# Patient Record
Sex: Female | Born: 1940 | Race: White | Hispanic: No | Marital: Married | Smoking: Former smoker
Health system: Southern US, Community
[De-identification: ages and names within clinical notes are randomized; demographics above are authoritative.]

## PROBLEM LIST (undated history)

## (undated) DIAGNOSIS — F329 Major depressive disorder, single episode, unspecified: Secondary | ICD-10-CM

## (undated) DIAGNOSIS — M549 Dorsalgia, unspecified: Secondary | ICD-10-CM

## (undated) DIAGNOSIS — IMO0002 Reserved for concepts with insufficient information to code with codable children: Secondary | ICD-10-CM

## (undated) DIAGNOSIS — R42 Dizziness and giddiness: Secondary | ICD-10-CM

## (undated) DIAGNOSIS — K219 Gastro-esophageal reflux disease without esophagitis: Secondary | ICD-10-CM

## (undated) DIAGNOSIS — E039 Hypothyroidism, unspecified: Secondary | ICD-10-CM

## (undated) DIAGNOSIS — I2510 Atherosclerotic heart disease of native coronary artery without angina pectoris: Secondary | ICD-10-CM

## (undated) DIAGNOSIS — M79672 Pain in left foot: Secondary | ICD-10-CM

## (undated) DIAGNOSIS — M25579 Pain in unspecified ankle and joints of unspecified foot: Secondary | ICD-10-CM

## (undated) DIAGNOSIS — E785 Hyperlipidemia, unspecified: Secondary | ICD-10-CM

## (undated) DIAGNOSIS — K449 Diaphragmatic hernia without obstruction or gangrene: Secondary | ICD-10-CM

## (undated) DIAGNOSIS — D126 Benign neoplasm of colon, unspecified: Secondary | ICD-10-CM

## (undated) DIAGNOSIS — F32A Depression, unspecified: Secondary | ICD-10-CM

## (undated) DIAGNOSIS — C73 Malignant neoplasm of thyroid gland: Secondary | ICD-10-CM

## (undated) DIAGNOSIS — L989 Disorder of the skin and subcutaneous tissue, unspecified: Secondary | ICD-10-CM

## (undated) DIAGNOSIS — T7840XA Allergy, unspecified, initial encounter: Secondary | ICD-10-CM

## (undated) DIAGNOSIS — S8290XA Unspecified fracture of unspecified lower leg, initial encounter for closed fracture: Secondary | ICD-10-CM

## (undated) DIAGNOSIS — I10 Essential (primary) hypertension: Secondary | ICD-10-CM

## (undated) DIAGNOSIS — M1990 Unspecified osteoarthritis, unspecified site: Secondary | ICD-10-CM

## (undated) HISTORY — DX: Benign neoplasm of colon, unspecified: D12.6

## (undated) HISTORY — PX: FRACTURE SURGERY: SHX138

## (undated) HISTORY — DX: Malignant neoplasm of thyroid gland: C73

## (undated) HISTORY — DX: Reserved for concepts with insufficient information to code with codable children: IMO0002

## (undated) HISTORY — PX: TUBAL LIGATION: SHX77

## (undated) HISTORY — DX: Essential (primary) hypertension: I10

## (undated) HISTORY — PX: BALLOON ANGIOPLASTY, ARTERY: SHX564

## (undated) HISTORY — DX: Gastro-esophageal reflux disease without esophagitis: K21.9

## (undated) HISTORY — PX: THYROIDECTOMY, PARTIAL: SHX18

## (undated) HISTORY — DX: Hyperlipidemia, unspecified: E78.5

## (undated) HISTORY — DX: Dizziness and giddiness: R42

## (undated) HISTORY — DX: Unspecified osteoarthritis, unspecified site: M19.90

## (undated) HISTORY — DX: Atherosclerotic heart disease of native coronary artery without angina pectoris: I25.10

## (undated) HISTORY — DX: Pain in unspecified ankle and joints of unspecified foot: M25.579

## (undated) HISTORY — PX: LESION REMOVAL: SHX5196

## (undated) HISTORY — DX: Dorsalgia, unspecified: M54.9

## (undated) HISTORY — DX: Diaphragmatic hernia without obstruction or gangrene: K44.9

## (undated) HISTORY — DX: Unspecified fracture of unspecified lower leg, initial encounter for closed fracture: S82.90XA

## (undated) HISTORY — DX: Depression, unspecified: F32.A

## (undated) HISTORY — DX: Pain in left foot: M79.672

## (undated) HISTORY — DX: Hypothyroidism, unspecified: E03.9

## (undated) HISTORY — DX: Allergy, unspecified, initial encounter: T78.40XA

## (undated) HISTORY — DX: Major depressive disorder, single episode, unspecified: F32.9

## (undated) HISTORY — DX: Disorder of the skin and subcutaneous tissue, unspecified: L98.9

---

## 2000-02-27 ENCOUNTER — Encounter: Payer: Self-pay | Admitting: Otolaryngology

## 2000-02-27 ENCOUNTER — Encounter: Admission: RE | Admit: 2000-02-27 | Discharge: 2000-02-27 | Payer: Self-pay | Admitting: Otolaryngology

## 2000-03-16 ENCOUNTER — Other Ambulatory Visit: Admission: RE | Admit: 2000-03-16 | Discharge: 2000-03-16 | Payer: Self-pay | Admitting: Otolaryngology

## 2000-04-27 ENCOUNTER — Encounter: Payer: Self-pay | Admitting: Otolaryngology

## 2000-04-30 ENCOUNTER — Inpatient Hospital Stay (HOSPITAL_COMMUNITY): Admission: RE | Admit: 2000-04-30 | Discharge: 2000-05-01 | Payer: Self-pay | Admitting: Otolaryngology

## 2000-04-30 ENCOUNTER — Encounter (INDEPENDENT_AMBULATORY_CARE_PROVIDER_SITE_OTHER): Payer: Self-pay

## 2000-05-19 ENCOUNTER — Ambulatory Visit (HOSPITAL_COMMUNITY): Admission: RE | Admit: 2000-05-19 | Discharge: 2000-05-20 | Payer: Self-pay | Admitting: Otolaryngology

## 2000-08-27 ENCOUNTER — Ambulatory Visit (HOSPITAL_COMMUNITY): Admission: RE | Admit: 2000-08-27 | Discharge: 2000-08-27 | Payer: Self-pay | Admitting: Oncology

## 2000-08-27 ENCOUNTER — Encounter: Payer: Self-pay | Admitting: Oncology

## 2000-08-30 ENCOUNTER — Ambulatory Visit (HOSPITAL_COMMUNITY): Admission: RE | Admit: 2000-08-30 | Discharge: 2000-08-30 | Payer: Self-pay | Admitting: Oncology

## 2000-09-20 ENCOUNTER — Ambulatory Visit (HOSPITAL_COMMUNITY): Admission: RE | Admit: 2000-09-20 | Discharge: 2000-09-20 | Payer: Self-pay | Admitting: Oncology

## 2000-09-20 ENCOUNTER — Encounter: Payer: Self-pay | Admitting: Oncology

## 2002-03-14 ENCOUNTER — Encounter: Payer: Self-pay | Admitting: Cardiology

## 2002-06-07 ENCOUNTER — Ambulatory Visit (HOSPITAL_COMMUNITY): Admission: RE | Admit: 2002-06-07 | Discharge: 2002-06-07 | Payer: Self-pay | Admitting: Family Medicine

## 2002-09-20 ENCOUNTER — Encounter: Payer: Self-pay | Admitting: Oncology

## 2002-09-20 ENCOUNTER — Ambulatory Visit: Admission: RE | Admit: 2002-09-20 | Discharge: 2002-09-20 | Payer: Self-pay | Admitting: Oncology

## 2004-08-17 DIAGNOSIS — D126 Benign neoplasm of colon, unspecified: Secondary | ICD-10-CM

## 2004-08-17 HISTORY — DX: Benign neoplasm of colon, unspecified: D12.6

## 2004-10-30 ENCOUNTER — Ambulatory Visit: Payer: Self-pay | Admitting: Gastroenterology

## 2004-11-05 ENCOUNTER — Ambulatory Visit: Payer: Self-pay | Admitting: Gastroenterology

## 2004-12-10 ENCOUNTER — Ambulatory Visit (HOSPITAL_COMMUNITY): Admission: RE | Admit: 2004-12-10 | Discharge: 2004-12-10 | Payer: Self-pay | Admitting: Gastroenterology

## 2005-05-28 ENCOUNTER — Ambulatory Visit: Payer: Self-pay | Admitting: Oncology

## 2005-08-25 ENCOUNTER — Ambulatory Visit: Payer: Self-pay | Admitting: Cardiology

## 2005-09-18 ENCOUNTER — Encounter: Payer: Self-pay | Admitting: Cardiology

## 2005-09-18 ENCOUNTER — Ambulatory Visit: Payer: Self-pay

## 2006-03-10 ENCOUNTER — Ambulatory Visit (HOSPITAL_COMMUNITY): Admission: RE | Admit: 2006-03-10 | Discharge: 2006-03-10 | Payer: Self-pay | Admitting: Family Medicine

## 2006-03-10 ENCOUNTER — Encounter: Payer: Self-pay | Admitting: Vascular Surgery

## 2006-12-23 ENCOUNTER — Encounter: Admission: RE | Admit: 2006-12-23 | Discharge: 2006-12-23 | Payer: Self-pay | Admitting: Family Medicine

## 2007-05-04 ENCOUNTER — Other Ambulatory Visit: Admission: RE | Admit: 2007-05-04 | Discharge: 2007-05-04 | Payer: Self-pay | Admitting: Obstetrics & Gynecology

## 2007-05-04 ENCOUNTER — Encounter: Admission: RE | Admit: 2007-05-04 | Discharge: 2007-05-04 | Payer: Self-pay | Admitting: Obstetrics & Gynecology

## 2007-08-03 ENCOUNTER — Encounter: Admission: RE | Admit: 2007-08-03 | Discharge: 2007-08-03 | Payer: Self-pay | Admitting: Family Medicine

## 2007-08-15 ENCOUNTER — Encounter: Admission: RE | Admit: 2007-08-15 | Discharge: 2007-08-15 | Payer: Self-pay | Admitting: Family Medicine

## 2007-09-15 ENCOUNTER — Ambulatory Visit: Payer: Self-pay | Admitting: Oncology

## 2007-09-19 LAB — COMPREHENSIVE METABOLIC PANEL
ALT: 13 U/L (ref 0–35)
AST: 15 U/L (ref 0–37)
Alkaline Phosphatase: 80 U/L (ref 39–117)
Calcium: 9.9 mg/dL (ref 8.4–10.5)
Chloride: 99 mEq/L (ref 96–112)
Creatinine, Ser: 1.04 mg/dL (ref 0.40–1.20)
Glucose, Bld: 91 mg/dL (ref 70–99)
Potassium: 4.2 mEq/L (ref 3.5–5.3)
Total Protein: 7.7 g/dL (ref 6.0–8.3)

## 2007-09-19 LAB — CBC & DIFF AND RETIC
HCT: 38.9 % (ref 34.8–46.6)
HGB: 13.8 g/dL (ref 11.6–15.9)
LYMPH%: 36.9 % (ref 14.0–48.0)
MCH: 31.2 pg (ref 26.0–34.0)
MCV: 88.2 fL (ref 81.0–101.0)
MONO#: 0.5 10*3/uL (ref 0.1–0.9)
MONO%: 6.6 % (ref 0.0–13.0)
NEUT#: 3.7 10*3/uL (ref 1.5–6.5)
NEUT%: 52.9 % (ref 39.6–76.8)
RDW: 12.8 % (ref 11.3–14.5)
WBC: 6.9 10*3/uL (ref 3.9–10.0)

## 2007-09-19 LAB — LACTATE DEHYDROGENASE: LDH: 155 U/L (ref 94–250)

## 2007-09-21 ENCOUNTER — Ambulatory Visit: Payer: Self-pay | Admitting: Gastroenterology

## 2007-09-30 ENCOUNTER — Encounter: Payer: Self-pay | Admitting: Gastroenterology

## 2007-09-30 ENCOUNTER — Ambulatory Visit: Payer: Self-pay | Admitting: Gastroenterology

## 2007-10-24 ENCOUNTER — Encounter: Payer: Self-pay | Admitting: Cardiology

## 2008-01-05 ENCOUNTER — Ambulatory Visit: Payer: Self-pay | Admitting: Oncology

## 2008-01-11 ENCOUNTER — Ambulatory Visit (HOSPITAL_COMMUNITY): Admission: RE | Admit: 2008-01-11 | Discharge: 2008-01-11 | Payer: Self-pay | Admitting: Oncology

## 2008-02-05 ENCOUNTER — Inpatient Hospital Stay (HOSPITAL_COMMUNITY): Admission: EM | Admit: 2008-02-05 | Discharge: 2008-02-07 | Payer: Self-pay | Admitting: Cardiology

## 2008-02-05 ENCOUNTER — Encounter: Payer: Self-pay | Admitting: Emergency Medicine

## 2008-07-16 ENCOUNTER — Ambulatory Visit: Payer: Self-pay | Admitting: Vascular Surgery

## 2008-07-16 ENCOUNTER — Encounter: Payer: Self-pay | Admitting: Cardiology

## 2008-07-16 ENCOUNTER — Observation Stay (HOSPITAL_COMMUNITY): Admission: EM | Admit: 2008-07-16 | Discharge: 2008-07-17 | Payer: Self-pay | Admitting: *Deleted

## 2009-09-26 ENCOUNTER — Encounter (INDEPENDENT_AMBULATORY_CARE_PROVIDER_SITE_OTHER): Payer: Self-pay | Admitting: *Deleted

## 2009-10-14 ENCOUNTER — Encounter (INDEPENDENT_AMBULATORY_CARE_PROVIDER_SITE_OTHER): Payer: Self-pay | Admitting: *Deleted

## 2009-11-19 ENCOUNTER — Encounter (INDEPENDENT_AMBULATORY_CARE_PROVIDER_SITE_OTHER): Payer: Self-pay | Admitting: *Deleted

## 2009-11-20 ENCOUNTER — Ambulatory Visit: Payer: Self-pay | Admitting: Gastroenterology

## 2009-11-25 ENCOUNTER — Telehealth (INDEPENDENT_AMBULATORY_CARE_PROVIDER_SITE_OTHER): Payer: Self-pay | Admitting: *Deleted

## 2009-12-03 ENCOUNTER — Ambulatory Visit: Payer: Self-pay | Admitting: Gastroenterology

## 2009-12-04 ENCOUNTER — Encounter: Payer: Self-pay | Admitting: Gastroenterology

## 2010-09-16 NOTE — Miscellaneous (Signed)
Summary: [facility]  Clinical Lists Changes  Medications: Added new medication of MOVIPREP 100 GM  SOLR (PEG-KCL-NACL-NASULF-NA ASC-C) As per prep instructions. - Signed Rx of MOVIPREP 100 GM  SOLR (PEG-KCL-NACL-NASULF-NA ASC-C) As per prep instructions.;  #1 x 0;  Signed;  Entered by: Ezra Sites RN;  Authorized by: Meryl Dare MD [facility];  Method used: [facility]  [place] [address], [place], [place]  [address], Ph: [phone] or [phone], Fax: [phone] Observations: Added new observation of NKA: T (11/20/2009 12:50)    Prescriptions: MOVIPREP 100 GM  SOLR (PEG-KCL-NACL-NASULF-NA ASC-C) As per prep instructions.  #1 x 0   Entered by:   Ezra Sites RN   Authorized by:   Meryl Dare MD [facility]   Signed by:   Ezra Sites RN on 11/20/2009   Method used:   [facility]        [facility]  [place] [address] (retail)       [address]       [place], [place]  [address]       Ph: [phone] or [phone]       Fax: [phone]   RxID:   [id number]

## 2010-09-16 NOTE — Letter (Signed)
Summary: Previsit letter  [facility]  [address] [place], [place] [address]   Phone: [phone]  Fax: [phone]       10/14/2009 MRN: [id number]  [facility] [address] [place], [place]  [address]  Dear [name],  Welcome to the Gastroenterology Division at [facility].    You are scheduled to see a nurse for your pre-procedure visit on 11-20-09 at 1pm on the 3rd floor at [facility], [address]  We ask that you try to arrive at our office 15 minutes prior to your appointment time to allow for check-in.  Your nurse visit will consist of discussing your medical and surgical history, your immediate family medical history, and your medications.    Please bring a complete list of all your medications or, if you prefer, bring the medication bottles and we will list them.  We will need to be aware of both prescribed and over the counter drugs.  We will need to know exact dosage information as well.  If you are on blood thinners (Coumadin, Plavix, Aggrenox, Ticlid, etc.) please call our office today/prior to your appointment, as we need to consult with your physician about holding your medication.   Please be prepared to read and sign documents such as consent forms, a financial agreement, and acknowledgement forms.  If necessary, and with your consent, a friend or relative is welcome to sit-in on the nurse visit with you.  Please bring your insurance card so that we may make a copy of it.  If your insurance requires a referral to see a specialist, please bring your referral form from your primary care physician.  No co-pay is required for this nurse visit.     If you cannot keep your appointment, please call [phone] to cancel or reschedule prior to your appointment date.  This allows [place] the opportunity to schedule an appointment for another patient in need of care.    Thank you for choosing [facility] for your medical needs.  We  appreciate the opportunity to care for you.  Please visit [place] at our website  to learn more about our practice.                     Sincerely.                                                                                                                   The Gastroenterology Division

## 2010-09-16 NOTE — Letter (Signed)
Summary: Patient Notice- Polyp Results  [facility]  [address] [place], [place] [address]   Phone: [phone]  Fax: [phone]        December 04, 2009 MRN: [id number]    Colleen Wright [address] [place], [place]  [address]    Dear Ms. [name],  I am pleased to inform you that the colon polyp(s) removed during your recent colonoscopy was (were) found to be benign (no cancer detected) upon pathologic examination.  I recommend you have a repeat colonoscopy examination in 5 years to look for recurrent polyps, as having colon polyps increases your risk for having recurrent polyps or even colon cancer in the future.  Should you develop new or worsening symptoms of abdominal pain, bowel habit changes or bleeding from the rectum or bowels, please schedule an evaluation with either your primary care physician or with me.  Continue treatment plan as outlined the day of your exam.  Please call us if you are having persistent problems or have questions about your condition that have not been fully answered at this time.  Sincerely,  Meryl Dare MD [facility]  This letter has been electronically signed by your physician.  Appended Document: Patient Notice- Polyp Results letter mailed 4.25.11

## 2010-09-16 NOTE — Procedures (Signed)
Summary: Colonoscopy  Patient: Colleen Wright Note: All result statuses are Final unless otherwise noted.  Tests: (1) Colonoscopy (COL)   COL Colonoscopy           DONE     [facility]     [address]     [place], [place]  [address]           COLONOSCOPY PROCEDURE REPORT           PATIENT:  Colleen, Wright  MR#:  [id number]     BIRTHDATE:  November 05, 1940, 68 yrs. old  GENDER:  female     ENDOSCOPIST:  Judie Petit T. Russella Dar, MD, [facility]           PROCEDURE DATE:  12/03/2009     PROCEDURE:  Colonoscopy with snare polypectomy     ASA CLASS:  Class II     INDICATIONS:  1) surveillance and high-risk screening  2)     follow-up of polyp, adenomatous polyp, 10/2004.     MEDICATIONS:   Fentanyl 100 mcg IV, Versed 10 mg IV     DESCRIPTION OF PROCEDURE:   After the risks benefits and     alternatives of the procedure were thoroughly explained, informed     consent was obtained.  Digital rectal exam was performed and     revealed no abnormalities.   The LB PCF-H180AL [id number] endoscope     was introduced through the anus and advanced to the cecum, which     was identified by both the appendix and ileocecal valve, without     limitations.  The quality of the prep was excellent, using     MoviPrep.  The instrument was then slowly withdrawn as the colon     was fully examined.     <<PROCEDUREIMAGES>>           FINDINGS:  A sessile polyp was found at the hepatic flexure. It     was 5 mm in size. Polyp was snared without cautery. Retrieval was     successful. A normal appearing cecum, ileocecal valve, and     appendiceal orifice were identified. The ascending, transverse,     splenic flexure, descending, sigmoid colon, and rectum appeared     unremarkable. Retroflexed views in the rectum revealed no     abnormalities.  The time to cecum =  2  minutes. The scope was     then withdrawn (time =  8  min) from the patient and the procedure     completed.           COMPLICATIONS:  None     ENDOSCOPIC  IMPRESSION:     1) 5 mm sessile polyp at the hepatic flexure     RECOMMENDATIONS:     1) Await pathology results     2) Repeat Colonoscopy in 5 years.           Venita Lick. Russella Dar, MD, [facility]           CC: Evelena Peat, MD           n.     Rosalie DoctorVenita Lick. Leanndra Pember at 12/03/2009 10:31 AM           Colleen Wright, [id number]  Note: An exclamation mark (!) indicates a result that was not dispersed into the flowsheet. Document Creation Date: 12/03/2009 10:32 AM _______________________________________________________________________  (1) Order result status: Final Collection or observation date-time: 12/03/2009 10:24 Requested date-time:  Receipt date-time:  Reported  date-time:  Referring Physician:   Ordering Physician: Claudette Head [phone]) Specimen Source:  Source: Launa Grill Order Number: [id number] Lab site:   Appended Document: Colonoscopy     Procedures Next Due Date:    Colonoscopy: 11/2014

## 2010-09-16 NOTE — Letter (Signed)
Summary: [facility] Instructions  [facility]  [address] [place], [place] [address]   Phone: [phone]  Fax: [phone]       Colleen Wright    1941-03-05    MRN: [id number]        Procedure Day /Date:  Tuesday 12/03/2009     Arrival Time: 9:00 am      Procedure Time: 10:00 am     Location of Procedure:                    _ x_  [facility] (4th Floor)                        PREPARATION FOR COLONOSCOPY WITH MOVIPREP   Starting 5 days prior to your procedure Thursday 4/14 do not eat nuts, seeds, popcorn, corn, beans, peas,  salads, or any raw vegetables.  Do not take any fiber supplements (e.g. Metamucil, Citrucel, and Benefiber).  THE DAY BEFORE YOUR PROCEDURE         DATE: Monday 4/18  1.  Drink clear liquids the entire day-NO SOLID FOOD  2.  Do not drink anything colored red or purple.  Avoid juices with pulp.  No orange juice.  3.  Drink at least 64 oz. (8 glasses) of fluid/clear liquids during the day to prevent dehydration and help the prep work efficiently.  CLEAR LIQUIDS INCLUDE: Water Jello Ice Popsicles Tea (sugar ok, no milk/cream) Powdered fruit flavored drinks Coffee (sugar ok, no milk/cream) Gatorade Juice: apple, white grape, white cranberry  Lemonade Clear bullion, consomm, broth Carbonated beverages (any kind) Strained chicken noodle soup Hard Candy                             4.  In the morning, mix first dose of MoviPrep solution:    Empty 1 Pouch A and 1 Pouch B into the disposable container    Add lukewarm drinking water to the top line of the container. Mix to dissolve    Refrigerate (mixed solution should be used within 24 hrs)  5.  Begin drinking the prep at 5:00 p.m. The MoviPrep container is divided by 4 marks.   Every 15 minutes drink the solution down to the next mark (approximately 8 oz) until the full liter is complete.   6.  Follow completed prep with 16 oz of clear liquid of your choice (Nothing  red or purple).  Continue to drink clear liquids until bedtime.  7.  Before going to bed, mix second dose of MoviPrep solution:    Empty 1 Pouch A and 1 Pouch B into the disposable container    Add lukewarm drinking water to the top line of the container. Mix to dissolve    Refrigerate  THE DAY OF YOUR PROCEDURE      DATE: Tuesday 4/19  Beginning at 5:00 a.m. (5 hours before procedure):         1. Every 15 minutes, drink the solution down to the next mark (approx 8 oz) until the full liter is complete.  2. Follow completed prep with 16 oz. of clear liquid of your choice.    3. You may drink clear liquids until 8:00 am (2 HOURS BEFORE PROCEDURE).   MEDICATION INSTRUCTIONS  Unless otherwise instructed, you should take regular prescription medications with a small sip of water   as early as possible the morning of  your procedure.           OTHER INSTRUCTIONS  You will need a responsible adult at least 72 years of age to accompany you and drive you home.   This person must remain in the waiting room during your procedure.  Wear loose fitting clothing that is easily removed.  Leave jewelry and other valuables at home.  However, you may wish to bring a book to read or  an iPod/MP3 player to listen to music as you wait for your procedure to start.  Remove all body piercing jewelry and leave at home.  Total time from sign-in until discharge is approximately 2-3 hours.  You should go home directly after your procedure and rest.  You can resume normal activities the  day after your procedure.  The day of your procedure you should not:   Drive   Make legal decisions   Operate machinery   Drink alcohol   Return to work  You will receive specific instructions about eating, activities and medications before you leave.    The above instructions have been reviewed and explained to me by   Ezra Sites RN  November 20, 2009 1:28 PM     I fully understand and can  verbalize these instructions _____________________________ Date _________

## 2010-09-16 NOTE — Letter (Signed)
Summary: Colonoscopy Letter  [facility]  [address] [place], [place] [address]   Phone: [phone]  Fax: [phone]      September 26, 2009 MRN: [id number]   HEBAH BOGOSIAN [address] [place], [place]  [address]   Dear Ms. [name],   According to your medical record, it is time for you to schedule a Colonoscopy. The American Cancer Society recommends this procedure as a method to detect early colon cancer. Patients with a family history of colon cancer, or a personal history of colon polyps or inflammatory bowel disease are at increased risk.  This letter has beeen generated based on the recommendations made at the time of your procedure. If you feel that in your particular situation this may no longer apply, please contact our office.  Please call our office at [phone] to schedule this appointment or to update your records at your earliest convenience.  Thank you for cooperating with [place] to provide you with the very best care possible.   Sincerely,  Judie Petit T. Russella Dar, M.D.  [facility] [phone]

## 2010-09-16 NOTE — Progress Notes (Signed)
Summary: Moviprep is too expensive  Phone Note Call from Patient   Caller: Patient Call For: Dr. Russella Dar Reason for Call: Talk to Nurse Summary of Call: Moviprep is too expensive--would like something else Initial call taken by: Karna Christmas,  November 25, 2009 12:20 PM  Follow-up for Phone Call        Attempted to speak with the patient.  Answering machine picked up with no ID. did not leave a message.  Will try again later. Follow-up by: Clide Cliff RN,  November 25, 2009 3:19 PM  Additional Follow-up for Phone Call Additional follow up Details #1::        Still only answering machine picked up.  No ID. Unable to reach patient today.  Hopefully pt will call back tomorrow. Additional Follow-up by: Clide Cliff RN,  November 25, 2009 5:16 PM     Appended Document: Colleen Wright is too expensive Spoke with pt regarding the Moviprep. Pt was not sure how much the Moviprep was going to cost her, the pharmacist had called her. Will send the pt the $20.00 rebate for the Moviprep and she will try it. She will call back if she has further questions.

## 2010-12-10 ENCOUNTER — Other Ambulatory Visit: Payer: Self-pay | Admitting: Cardiology

## 2010-12-10 DIAGNOSIS — E7800 Pure hypercholesterolemia, unspecified: Secondary | ICD-10-CM

## 2010-12-10 MED ORDER — SIMVASTATIN 20 MG PO TABS: 20.0000 mg | ORAL_TABLET | Freq: Every day | ORAL | Status: DC

## 2010-12-10 NOTE — Telephone Encounter (Signed)
Wants to know if she can change from Crestor to Pravastatin since Crestor is so expensive

## 2010-12-10 NOTE — Telephone Encounter (Signed)
Refilled simvastatin.

## 2010-12-17 ENCOUNTER — Emergency Department (HOSPITAL_BASED_OUTPATIENT_CLINIC_OR_DEPARTMENT_OTHER)
Admission: EM | Admit: 2010-12-17 | Discharge: 2010-12-18 | Disposition: A | Discharge status: Home / Self Care | Payer: Medicare Other | Attending: Emergency Medicine | Admitting: Emergency Medicine

## 2010-12-17 ENCOUNTER — Emergency Department (INDEPENDENT_AMBULATORY_CARE_PROVIDER_SITE_OTHER): Payer: Medicare Other

## 2010-12-17 DIAGNOSIS — I10 Essential (primary) hypertension: Secondary | ICD-10-CM | POA: Insufficient documentation

## 2010-12-17 DIAGNOSIS — H539 Unspecified visual disturbance: Secondary | ICD-10-CM

## 2010-12-17 DIAGNOSIS — W108XXA Fall (on) (from) other stairs and steps, initial encounter: Secondary | ICD-10-CM | POA: Insufficient documentation

## 2010-12-17 DIAGNOSIS — S060X0A Concussion without loss of consciousness, initial encounter: Secondary | ICD-10-CM | POA: Insufficient documentation

## 2010-12-17 DIAGNOSIS — M8448XA Pathological fracture, other site, initial encounter for fracture: Secondary | ICD-10-CM | POA: Insufficient documentation

## 2010-12-17 DIAGNOSIS — M545 Low back pain, unspecified: Secondary | ICD-10-CM

## 2010-12-17 DIAGNOSIS — E7800 Pure hypercholesterolemia, unspecified: Secondary | ICD-10-CM | POA: Insufficient documentation

## 2010-12-17 DIAGNOSIS — I2510 Atherosclerotic heart disease of native coronary artery without angina pectoris: Secondary | ICD-10-CM | POA: Insufficient documentation

## 2010-12-17 DIAGNOSIS — R51 Headache: Secondary | ICD-10-CM

## 2010-12-30 NOTE — Cardiovascular Report (Signed)
NAMEAVARIE, TAVANO NO.:  [id number]   MEDICAL RECORD NO.:  [id number]           PATIENT TYPE:   LOCATION:                                 FACILITY:   PHYSICIAN:  Peter M. [place], M.D.  DATE OF BIRTH:  1941-07-08   DATE OF PROCEDURE:  DATE OF DISCHARGE:                            CARDIAC CATHETERIZATION   INDICATIONS FOR PROCEDURE:  A 70 year old white female who presents with  unstable angina.  Diagnostic cardiac catheterization demonstrated  moderate 50% stenosis in the mid LAD.  She had a 95% stenosis in the  origin of the posterolateral branch of the right coronary artery.  We  proceeded with percutaneous intervention of the posterolateral branch of  the right coronary artery.   EQUIPMENTS:  A 6-French left Amplatz 1 guide 0.014, Asahi medium wire,  and a 2.5- x 12-mm apex balloon.   MEDICATIONS:  Angiomax given as a bolus and continuous infusion with  subsequent ACT of 279.  She also received an additional 2 mg of IV  Versed and nitroglycerin 200 mcg intracoronary x2.   We initially attempted procedure with a 6-French right Judkins 3.5 guide  as well as a hockey stick guide, both of these yielded inadequate backup  support to get the wire down to the posterolateral branch.  We then  exchanged for a left Amplatz 1 guide that was deeply seated into the  right coronary artery and gave excellent support to the procedure.  We  were able to successfully wire the posterolateral branch.  We then  performed balloon angioplasty using a 2.5- x 12-mm apex balloon dilated  to 6 and then 8 atmospheres across the lesion.  This seemed to yield  quite easily.  Initial angiogram showed some narrowing in the origin of  PDA, but this resolved with intracoronary nitroglycerin.  An excellent  angiographic result was obtained with less than 20% residual stenosis  and TIMI grade 3 flow in the posterolateral branch.  The patient was  pain free at this point.  It was noted that  she had ST elevation and  chest tightness with each balloon inflation.  Following procedure,  Angiomax was discontinued and her right groin was closed using the Angio-  Seal device with excellent hemostasis.   FINAL INTERPRETATION:  Successful balloon angioplasty of the  posterolateral branch of the right coronary.           ______________________________  Peter M. [place]iland, M.D.     PMJ/MEDQ  D:  02/06/2008  T:  02/07/2008  Job:  [id number]   cc:   Elmore Guise., M.D.  Cassell Clement, M.D.

## 2010-12-30 NOTE — H&P (Signed)
NAMELY, WASS NO.:  [id number]   MEDICAL RECORD NO.:  [id number]          PATIENT TYPE:  INP   LOCATION:  3712                         FACILITY:  [facility]   PHYSICIAN:  Rollene Rotunda, MD, FACCDATE OF BIRTH:  1941-04-02   DATE OF ADMISSION:  07/15/2008  DATE OF DISCHARGE:                              HISTORY & PHYSICAL   The primary is Colleen Wright, Nurse Practitioner, [facility].   The cardiologist, Dr. Cassell Clement.   REASON FOR PRESENTATION:  The patient with chest pain and leg weakness.   HISTORY OF PRESENT ILLNESS:  The patient is 70 years old.  She recently  had a balloon angioplasty of 95% posterolateral lesion.  This was June  of 2009.  Says she has not felt particularly well since then.  She was  complaining of chest pain prior to that.  She said that since then she  has been weak.  She feels fatigued quite a bit of the time.  She has  been having dizziness, particularly when she moves her head in a certain  direction or when she bends over.  She has not had any presyncope or  syncope.   Today while watching TV around 9:00 p.m. she developed some chest  discomfort.  She described this as a tightness or heaviness.  It was  substernal.  It did not radiate.  She then felt like her legs wouldn't  walk.  She said she could move everything but that she just could not  get them to walk.  She called her husband who helped her to the car.  They drove to the ER.  She said her discomfort in her chest was about  5/10.  She was given aspirin and nitroglycerin paste in the ER.  EKGs  were nonacute.  Point of care markers have been negative x1.  She still  has slight 2/10 discomfort, though she has a hard time quantifying it.  She says it is not like her previous pain at the time of her  angioplasty.  She did have some nausea.  She has not had any shortness  of breath.  She has not had any PND or orthopnea.  She says she just  feels  weird.   PAST MEDICAL HISTORY:  Coronary artery disease (95% posterolateral off  the right coronary artery status post balloon angioplasty, mid LAD 50%  stenosis in June 2009), hypertension, thyroid cancer (question type),  hyperlipidemia.   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   ALLERGIES:  AMOXICILLIN causes yeast infections.   MEDICATIONS:  Diovan (question 160/12.58 HCT), metoprolol (question  dose), Synthroid 112 mcg daily, pravastatin (question dose), alprazolam,  Ambien.   SOCIAL HISTORY:  The patient is married.  She has two children.  She  does not smoke cigarettes, does not drink alcohol except on rare  occasions.   FAMILY HISTORY:  Contributory for father dying of heart disease at an  early age.  Brother had bypass.  Sister  with coronary artery disease.   REVIEW OF SYSTEMS:  As stated in the HPI.  Positive for mild nerves and  depression.  Negative for all other systems.   PHYSICAL EXAMINATION:  GENERAL APPEARANCE:  The patient is pleasant and  in no distress.  VITAL SIGNS:  Blood pressure 127/67, heart rate 67 and regular,  respiratory rate 16, afebrile.  HEENT: Eyes are unremarkable. Pupils are equal, round, and reactive, and  reactive to light.  Fundi are not visualized. Oral mucosa unremarkable.  NECK: No jugular venous distention at 45 degrees. Carotid upstroke brisk  and symmetric. No bruits, no thyromegaly.  LYMPHATICS:  No cervical, axillary, or inguinal adenopathy.  LUNGS:  Clear to auscultation bilaterally.  BACK:  No costovertebral angle tenderness.  CHEST: Unremarkable.  HEART:  PMI not displaced or sustained, S1 and S2 within normal limits.  No S3 or S4. No clicks, rubs, murmurs.  ABDOMEN:  Flat, positive bowel sounds normal in frequency and pitch.  No  bruits, rebound, guarding or midline pulsatile mass. No hepatomegaly.  No splenomegaly.   SKIN:  No rashes, no nodules.  EXTREMITIES:  With 2+ pulses throughout, no edema, no cyanosis or  clubbing.  NEURO:  Oriented to person, place, time. Cranial nerves II-XII grossly  intact, motor grossly intact.   EKG:  Sinus rhythm, rate 61, axis within normal limits, intervals within  normal limits, no acute ST/T wave changes.   LABS:  WBC 9.4, hemoglobin 12.3, platelets 207, sodium 139, potassium  3.6, BUN 25, creatinine 1.14. Cardiac markers negative x1.   CHEST X-RAY:  Mild cardiomegaly, no acute disease.   ASSESSMENT AND PLAN:  1. Chest discomfort. The patient's chest comfort is atypical.  So far      there is no objective evidence of ischemia.  At this point I am      going to put her in for observation and rule out myocardial      infarction.  I will continue the nitro paste which was started in      the ER.  She will continue an aspirin. Will get an a.m. EKG.  I      would suggest that if she has no objective evidence of ischemia and      symptoms resolve, that she would not the need further invasive      evaluation.  Given the fact this is unlike her previous angina and      atypical, stress perfusion imaging would not be indicated at this      point either unless she has recurrent discomfort.  2. Hypertension.  I will continue her meds though I will ask her      husband to bring in her list.  I am going to start metoprolol 25      twice a day though she is on once daily dose.  Again will need to      clarify her meds.  3. Hypothyroidism.  Will get TSH.  I will continue her Synthroid.  4. Dyslipidemia.  The patient will continue the pravastatin, and I      will pick 40 mg,  although will check this dose as well.  5. Leg weakness/dizziness.  The patient's neuro exam is completely      unremarkable.  I do not think inpatient      evaluation is warranted.  She does complain of some dizziness and      could get an outpatient evaluation.  I am not sure whether she  has      had  carotid Dopplers.  She also may have vertigo or other such      condition, might benefit from evaluation by ENT or Neuro.  I will      defer to her primary care.      Rollene Rotunda, MD, [facility]  Electronically Signed     JH/MEDQ  D:  07/16/2008  T:  07/16/2008  Job:  [id number]   cc:   Colleen Fendt, NP

## 2010-12-30 NOTE — Discharge Summary (Signed)
Colleen Wright, Colleen Wright                 ACCOUNT NO.:  [id number]   MEDICAL RECORD NO.:  [id number]          PATIENT TYPE:  INP   LOCATION:  6523                         FACILITY:  [facility]   PHYSICIAN:  Cassell Clement, M.D. DATE OF BIRTH:  18-Dec-1940   DATE OF ADMISSION:  02/05/2008  DATE OF DISCHARGE:  02/07/2008                               DISCHARGE SUMMARY   FINAL DIAGNOSES:  1. Chest pain.  2. Angina pectoris.  3. Coronary atherosclerotic heart disease.  4. Essential hypertension.  5. Hypercholesterolemia  6. Hypothyroidism.   OPERATIONS PERFORMED:  1. Cardiac catheterization on February 06, 2008, by Dr. Lady Deutscher with      the finding of obstructive disease of the posterolateral branch of      the right coronary artery and moderate disease of the LAD and      preserved LV function with an ejection fraction of 70%.  2. Angioplasty of the right posterolateral branch of the right      coronary artery by Dr. Peter Swaziland.   HISTORY:  This is a 70 year old woman who presented to the emergency  room with worsening chest pain and was admitted by Dr. Viann Fish on  February 05, 2008.  She had had a recent stress Cardiolite, which did not  show any reversible ischemia.  She has had 2 prior cardiac caths years  ago, which were normal.  She was admitted.  Her vital signs were normal.  Physical exam was normal.  EKG normal and cardiac enzymes were normal,  but based upon her history she did proceed to cardiac catheterization on  the day after admission by Dr. Reyes Ivan and we did find obstruction at the  takeoff of the right posterolateral branch of the right coronary artery.  This was approximately 80% ostial stenosis.  She underwent successful  PTCA without stenting by Dr. Peter Swaziland.  She did well post cath.  There was no groin complications or bleeding and she was able to be  discharged home the following day.   ACCESSORY LABORATORY STUDIES:  At discharge; hemoglobin 10.4, hematocrit  30, and white count 6,600.  Potassium 3.8, BUN 16, creatinine 1.06, and  blood sugar 93.  Cardiac enzymes negative x3.  Cholesterol was 198 with  an LDL of 138, HDL of 42, triglycerides 88, and she had not been taking  her pravastatin, although she had it at home.   DISCHARGE MEDICATIONS:  The patient will be discharged on;  1. Pravachol 80 mg one daily, which she has tolerated well in the      past.  2. Diovan HCT 160/25 half a tablet daily.  3. Levothyroxine 112 mcg daily.  4. Xanax 0.5 mg at bedtime.  5. Ambien 5 mg a fourth of a tablet at bedtime p.r.n.  6. Coated aspirin 325 mg daily.  7. Plavix 75 mg daily for 1 month.  8. Toprol-XL 25 mg half tablet daily.   She will be seen in followup in Dr. Yevonne Pax office in 2 weeks.   CONDITION ON DISCHARGE:  Improved.  ______________________________  Cassell Clement, M.D.     TB/MEDQ  D:  02/07/2008  T:  02/07/2008  Job:  [id number]

## 2010-12-30 NOTE — Assessment & Plan Note (Signed)
[facility]                         GASTROENTEROLOGY OFFICE NOTE   Colleen Wright, Colleen Wright                        MRN:          [id number]  DATE:09/21/2007                            DOB:          1940-11-20    REFERRING PHYSICIAN:  Pierce Crane, MD   REASON FOR CONSULTATION:  Abnormal CT scan of the abdomen.   HISTORY OF PRESENT ILLNESS:  Colleen Wright is a 70 year old white female  that I have evaluated previously for adenomatous colon polyps.  She  underwent colonoscopy in March of 2006, which showed one adenomatous  polyp measuring 4 mm.  An upper endoscopy, performed at the same time  for dysphagia, revealed a nodule with pathology describing  neuroendocrine hyperplasia.  She has had problems over the past several  months with sacral area pain, radiating to both sides.  She noted one  episode of small amounts of rectal bleeding about ten days ago and she  had one or two episodes of minimal left lower quadrant cramping a few  months ago, but she has not had any other gastrointestinal complaints.  An abdominal and pelvic CT scan, performed in September 2008, showed  mild edema and stranding within the root of the small bowel mesentery  with some borderline mesenteric adenopathy.  A followup CT scan in  December revealed little change to slight worsening of the mesenteric  stranding with prominent mesenteric nodes again noted.  It raised the  concern for lymphoma or mesenteric panniculitis.  No other abnormalities  were noted.  The patient has had a history of thyroid cancer.  She  currently has no abdominal pain, change in bowel habits or change in  stool caliber.  She relates a weight-gain of about 7-8 pounds.   CURRENT MEDICATIONS:  Listed on the chart, updated and reviewed.   MEDICATION ALLERGIES:  None known.   EXAM:  No acute distress.  Weight 162 pounds.  Blood pressure is 138/68, pulse 72 and regular.  CHEST:  Clear to auscultation  bilaterally.  CARDIAC:  Regular rate and rhythm without murmurs appreciated.  ABDOMEN:  Soft, nontender, nondistended.  Normoactive bowel sounds.  No  palpable organomegaly, masses or hernias.  RECTAL EXAMINATION:  Reveals external hemorrhoids, no internal lesions,  and Hemoccult-negative, brown stool in the vault.  EXTREMITIES:  Without clubbing, cyanosis or edema.  NEUROLOGIC:  Alert and oriented times three.  Grossly nonfocal.   ASSESSMENT AND PLAN:  1. Abnormal CT scan of the small bowel mesentery.  I suspect this is a      benign process, such as mesenteric panniculitis.  The patient has      already been scheduled for followup abdominal CT scan at a six-      month interval by Dr. Donnie Coffin and I think this is a very reasonable      plan.  2. Small volume hematochezia likely related to external hemorrhoids.      May use Anusol-HC cream BID prn and standard hemorrhoidal care      instructions were given.  If she has persistent rectal bleeding, or  a change in bowel habits, stool caliber or abdominal pain, we will      proceed with colonoscopy.  3. Personal history of adenomatous colon polyps.  Surveillance      colonoscopy recommended for March 2011.  4. Gastric nodule with neuroendocrine hyperplasia.  We will plan for      repeat endoscopy to further evaluate.  5. Sacral area pain.  This does not appear to be a gastrointestinal      disorder.  Further followup with Rema Fendt. NP.     Venita Lick. Russella Dar, MD, [facility]  Electronically Signed    MTS/MedQ  DD: 09/21/2007  DT: 09/21/2007  Job #: [id number]   cc:   Pierce Crane, MD  Rema Fendt, NP

## 2010-12-30 NOTE — H&P (Signed)
Colleen Wright, Colleen Wright                 ACCOUNT NO.:  [id number]   MEDICAL RECORD NO.:  [id number]          PATIENT TYPE:  INP   LOCATION:  NA                           FACILITY:  [facility]   PHYSICIAN:  Georga Hacking, M.D.DATE OF BIRTH:  Aug 12, 1941   DATE OF ADMISSION:  02/05/2008  DATE OF DISCHARGE:                              HISTORY & PHYSICAL   This 70 year old female has a previous history of hypertension,  hyperlipidemia, and a family history of cardiac disease.  She has a  previous history of significant chest pains several years and has had at  least two catheterizations, the latest greater than 8 years ago.  She  has been treated for thyroid cancer with two previous surgeries as well  as radioactive iodine.  She has a history of fibromyalgia and has also  had intermittent chest pain through the years.  She recently switched to  seeing Dr. Patty Sermons and has reportedly had a stress test recently.  She  presented to the emergency room with crescendo chest pain over the past  3 days.  She complained of midsternal chest discomfort occurring at  rest, 3 days ago had it while picking green beans, occurred yesterday at  rest, again last night, and developed substernal pressure with some mild  shortness of breath without radiation on the way to church and came to  the emergency room.  Initial cardiac enzymes were negative, an EKG was  normal, and she is admitted to rule out unstable angina.   PAST MEDICAL HISTORY:  Remarkable for:  1. History of hiatal hernia.  2. Hypertension.  3. Hyperlipidemia.  4. Previous history of thyroid cancer treated with radioactive iodine      and surgery.   PREVIOUS SURGERY:  1. Bilateral tubal ligation.  2. Thyroidectomy with surgery twice.  3. Removal of a vaginal lesion.  4. Broken leg.   INTOLERANCE:  To AMOXICILLIN that causes yeast infections.   CURRENT MEDICATIONS:  She is sketchy on the details but takes:  1. One-fourth of an Ambien at  night.  2. Alprazolam at night.  3. Pravastatin unknown dose at night.  4. Diovan HCT 80/12.5 mg daily.  5. Synthroid uncertain dose at night.  6. Intermittent aspirin that she will take when she has chest pain.   FAMILY HISTORY:  Father died of heart trouble at unknown age.  Mother  died of a stroke.  Brother died after having had open-heart surgery.  A  sister has had open heart surgery and has had cancer also.  There is  also history of carotid artery disease in the family.   SOCIAL HISTORY:  She is married 54 years with two children, is a  nonsmoker, and does not use alcohol to excess.   REVIEW OF SYSTEMS:  Weight has been stable.  She complains of occasional  headache and sees spots in front of her eyes, wonders if it could be an  atypical migraine.  She has difficulty with hiatal hernia and has some  dysphasia.  She has had pain involving her tail bone and has been  evaluated  by Dr. Donnie Coffin, and has had some adenopathy noted on the  abdominal CT scan previously that is currently being followed.  She has  no dysuria or urgency.  She has a mild amount of arthritis and states  she has fibromyalgia in a generalized fashion.   EXAMINATION:  She is anxious-appearing female in no acute distress.  Her  blood pressure was 154/70, pulse was 76.  SKIN:  Warm and dry.  ENT:  EOMI.  PERLA.  C&S clear.  Healed thyroidectomy scar.  No carotid  bruits.  LUNGS:  Clear to A & P.  CARDIAC EXAM:  Normal S1, S2.  No S3 or murmur.  ABDOMEN:  Soft and nontender.  There is no mass, hepatosplenomegaly or  aneurysm.  Femoral distal pulses are 2+.  NEUROLOGIC:  Normal.   A 12-lead EKG is within normal limits.   LABORATORY DATA:  Shows a normal CBC, potassium is 3.4.  Chemistry panel  was normal.  Initial point-of-care enzymes were negative.   IMPRESSION:  1. Crescendo chest pain in a patient with two negative previous      cardiac catheterizations and recent non-invasive cardiac      evaluation.   The pain has some atypical features to it.  2. History of thyroid cancer treated with thyroidectomy and      radioactive iodine.  3. Hypertension, not well controlled today.  4. Hypokalemia.  5. Hyperlipidemia, under treatment.  6. Reported history of fibromyalgia.  7. History of abdominal adenopathy.   RECOMMENDATIONS:  Transfer to [facility].  Begin IV heparin, begin  beta blocker and aspirin.  Recurrent nitroglycerin if pain.  She will  need to have cardiac enzymes and probably will need to have a  catheterization to evaluate, since it has been a number of years since  her previous study and she has had ongoing chest pain despite a  noninvasive evaluation.      Georga Hacking, M.D.  Electronically Signed     WST/MEDQ  D:  02/05/2008  T:  02/05/2008  Job:  [id number]   cc:   Cassell Clement, M.D.  Rema Fendt

## 2010-12-30 NOTE — Discharge Summary (Signed)
Colleen Wright, Colleen Wright                 ACCOUNT NO.:  [id number]   MEDICAL RECORD NO.:  [id number]          PATIENT TYPE:  INP   LOCATION:  3712                         FACILITY:  [facility]   PHYSICIAN:  Cassell Clement, M.D. DATE OF BIRTH:  1941/02/27   DATE OF ADMISSION:  07/15/2008  DATE OF DISCHARGE:  07/17/2008                               DISCHARGE SUMMARY   FINAL DIAGNOSES:  1. Chest pain, myocardial infarction ruled out.  2. Hyperlipidemia.  3. Essential hypertension.  4. Noncritical bilateral carotid artery stenosis.  5. Left ventricular hypertrophy with normal systolic function and      diastolic dysfunction.  6. Mild elevation of right ventricular systolic pressure with mild-to-      moderate tricuspid valvular regurgitation and mild aortic valve      sclerosis.   OPERATIONS PERFORMED:  1. Carotid Dopplers  2. A 2-dimensional echocardiogram.  3. CT brain scan.   HISTORY:  This 70 year old woman was admitted at the emergency on  July 15, 2008, for observation.  She presents with chest pain and  leg weakness.  She has not had syncope or presyncope.  She came in  complaining that her legs did not seem to want to walk properly.  There  were no lateralizing findings on initial exam.   The past history revealed that she has known coronary artery disease,  and in June 2009 underwent angioplasty of a 95% posterolateral stenosis.  She was also noted to have a 50% mid LAD at that time.  She had a past  history of essential hypertension and a remote history of thyroid cancer  and a history of hyperlipidemia.   On physical exam, her blood pressure is 127/67, pulse is 67 and regular,  respirations are 16, she is afebrile.  Pupils are equal and reactive.  Jugular venous pressure normal.  Carotids revealed soft bruits.  The  chest is clear.  The heart reveals no gallop.  There is no murmur,  gallop, click, or rub.  The abdomen is nontender and no masses.  Extremities show no  phlebitis.  Pedal pulses are 2+.  On neurologic  exam, she is oriented.  Cranial nerves are intact.  Grossly intact motor  function is noted.   INITIAL LABORATORY DATA:  White count of 9400, hemoglobin 12.3.  Potassium 3.6, BUN 25, creatinine 1.1.  Cardiac markers in the emergency  room were negative x1.  EKG showed no acute ST-T wave changes.  Chest x-  ray showed mild cardiomegaly but no acute disease.   HOSPITAL COURSE:  The patient was admitted as a observation patient.  On  the day after admission, she was feeling better.  She had no further  chest pain.  Serial cardiac enzymes were negative.  She was noted to  have a soft left carotid bruit, and she has a family history, which is  positive for carotid artery disease.  The lungs were clear.  The heart  reveals a soft apical systolic murmur.  The abdomen is soft and  nontender.  She was complaining of headache on the morning after  admission and  this was attributed to the nitroglycerin ointment, which  was stopped.  She underwent a 2-dimensional echocardiogram, which showed  overall systolic function normal.  EF was 65-70%.  The patient had mild  LVH.  She had abnormal left ventricular relaxation.  She had aortic  valve sclerosis.  She had marked mitral annular calcification with mild  mitral regurgitation, and she had slightly elevated right ventricular  systolic pressure of 32 mmHg.  A CT brain scan showed moderate atrophy,  but no acute changes of stroke.  The carotid Dopplers preliminary  results showed a right distal internal carotid artery of 40-60% low  range and left carotid Doppler 60-80% low to mid range.  Vertebral  artery flow was antegrade.   The patient had no further symptoms, and she was able to walk in the  hall without difficulty, and by July 17, 2008, was ready for  discharge.  Vital signs remained stable and blood work was unremarkable.  On the day of discharge, her blood pressure was 138/75, pulse 64 and   normal sinus rhythm.  Lungs were clear.  The heart revealed no gallop.  We discussed the possibility of adding Aricept.  She does admit to  decrease in memory, but she does not want to start the Aricept at this  time.  We will keep it in mind for the future.   The patient is being discharged on a low-sodium, heart-healthy diet.  She is to see Dr. Patty Sermons in early January for an office visit.   DISCHARGE MEDICATIONS:  1. Diovan HCT 160/12.5 one daily.  2. Metoprolol 25 mg twice a day.  3. Synthroid 0.112 mg daily.  4. Pravastatin 40 mg each night.  5. Coated aspirin 325 mg daily.  6. Alprazolam 0.25 mg h.s.  7. Ambien a fourth of a tablet at bedtime if needed for sleep and she      is not sure what strength Ambien she has at home.  8. Nitrostat 150 sublingually p.r.n.   Condition on discharge is improved.           ______________________________  Cassell Clement, M.D.     TB/MEDQ  D:  07/17/2008  T:  07/17/2008  Job:  [id number]   cc:   [facility] Rema Fendt, Nurse Practitioner

## 2011-01-02 NOTE — Op Note (Signed)
[place]  Patient:    Colleen Wright, Colleen Wright                        MRN: [id number] Proc. Date: 05/19/00 Adm. Date:  [id number] Disc. Date: [id number] Attending:  Susy Frizzle CC:         Teena Irani. Arlyce Dice, M.D.   Operative Report  PREOPERATIVE DIAGNOSIS:  Thyroid carcinoma.  POSTOPERATIVE DIAGNOSIS:  Thyroid carcinoma.  OPERATION:  Left completion thyroidectomy.  SURGEON:  Jefry H. Pollyann Kennedy, M.D.  ASSISTANT:  Kathy Breach, M.D.  PHYSICIAN:  Teena Irani. Arlyce Dice, M.D.  ANESTHESIA:  General endotracheal anesthesia was used.  COMPLICATIONS:  None.  ESTIMATED BLOOD LOSS:  25 cc.  FINDINGS:  Diffuse nodularity of the left lobe of the thyroid with some areas of firmness.  INDICATIONS:  This 70 year old lady with a recent history of right hemithyroidectomy.  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  Risks, benefits, alternatives, and complications of the procedure explained to the patient and her husband who seemed to understand and agree to surgery.  DESCRIPTION OF PROCEDURE:  The patient was taken to the operating room and placed on the operating table in supine position.  Following induction of general endotracheal anesthesia, the neck was prepped and draped in a standard fashion.  A shoulder roll was placed beneath the shoulder blades.  The past scar was used as the new incision and electrocautery was used to incise through the scar, subcutaneous tissue, and through the platysma muscle.  Subplatysmal flap was developed superiorly to the thyroid notch and inferiorly to the clavicle.  The midline fascia was divided and the straps reflected laterally towards the left side.  The left thyroid lobe was identified, retracted towards the right side, and careful dissection was then accomplished using a combination of hemostat and ties and bipolar cautery electrocautery dissection.  The superior pole was dissected  first being brought inferiorly.  What appeared to be a superior parathyroid was identified and preserved with its blood supply.  The dissection was then continued down inferiorly.  The recurrent laryngeal nerve was identified and preserved.  The ligament of Berrys was dissected up toward the trachea.  The inferior vasculature was ligated between clamps and divided.  The thyroid specimen was dissected off the face of the trachea.  Specimen was sent for pathological evaluation.  The wound was irrigated with saline.  Electrocautery was used and 4-0 silk ties were used to complete hemostasis.  The wound was closed in layers using 4-0 chromic on the fascia, 3-0 chromic on the platysma muscle, and running 5-0 nylon on the skin.  A 7 [place] round JP drain was left in the wound, exited through the right side of the incision, and secured in place with a nylon suture.  The patient was then awakened from anesthesia, extubated, and transferred to recovery in good condition. DD:  05/19/00 TD:  05/20/00 Job: [id number]

## 2011-01-02 NOTE — Op Note (Signed)
[place]  Patient:    Colleen Wright, Colleen Wright                        MRN: [id number] Proc. Date: 04/30/00 Adm. Date:  [id number] Disc. Date: [id number] Attending:  Susy Frizzle CC:         Teena Irani. Arlyce Dice, M.D.   Operative Report  PREOPERATIVE DIAGNOSIS:  Thyroid mass.  POSTOPERATIVE DIAGNOSIS:  Thyroid  mass.  OPERATION:  Right hemithyroidectomy.  SURGEON:  Jefry H. Pollyann Kennedy, M.D.  ASSESSMENT:  Margit Banda. Jearld Fenton, M.D.  ANESTHESIA:  General endotracheal anesthesia was used.  COMPLICATIONS:  None.  FINDINGS:  Firm mass involving the right lobe of the thyroid gland.  Frozen section evaluation consistent with benign findings.  ESTIMATED BLOOD LOSS:  15 cc.  COMMENTS:  The patient tolerated the procedure well, was awakened from anesthesia, transferred to recovery in stable condition.  REFERRING PHYSICIAN:  Teena Irani. Arlyce Dice, M.D.  INDICATIONS:  The patient is a 70 year old lady with a right-sided thyroid mass.  Fine-needle aspiration biopsy revealed microfollicular type changes. Risks, benefits, alternatives, and complications of the procedure were explained to the patient who seemed to understand and agreed to the surgery.  DESCRIPTION OF PROCEDURE:  The patient was taken to the operating room and placed on the operating table in supine position.  Following induction of general endotracheal anesthesia the patient was prepped and draped in the standard fashion.  A low collar incision was outlined with a marking pen and incised with electrocautery.  Cautery dissection through the subcutaneous tissue and superficial fascia was accomplished.  The diastasis of the strap muscles was divided with cautery dissection as well.  The strap muscles were reflected laterally and dissection then ensued around the capsule of the right thyroid lobe.  The middle thyroid vein was ligated between clamps and divided. The superior pole was brought down and careful  dissection staying on the capsule was accomplished dividing all vessels in the area.  The superior laryngeal nerve was felt to be superior to this level of dissection due to the position of the thyroid gland.  Careful dissection down the inferior pole was then accomplished.  A suspected parathyroid was identified and perserved with his blood supply.  The recurrent laryngeal nerve was identified as well.  The ligament of Allyson Sabal was divided and the thyroid was dissected off of the anterior face of the trachea.  There was a slight adhesion to the face of the trachea consistent either with an inflammatory or neoplastic process.  The isthmus was divided and silk ties were used to ligate the vessels.  The specimen was sent for pathologic evaluation with frozen section.  The wound was irrigated with saline solution.  Bipolar cautery was used as needed for hemostasis.  The wound was closed in layers using 4-0 chromic on the platysma layer and the fascia of the strap muscles and running 4-0 nylon on the skin. A #7 [place] round JP drain was left in the wound, exited though the left-sided incision, and secured in place with a nylon suture.  The patient was then awakened, extubated, and transferred to recovery. DD:  05/03/00 TD:  05/04/00 Job: [id number]

## 2011-03-12 ENCOUNTER — Other Ambulatory Visit: Payer: Self-pay | Admitting: Neurosurgery

## 2011-03-12 DIAGNOSIS — M545 Low back pain, unspecified: Secondary | ICD-10-CM

## 2011-03-26 ENCOUNTER — Other Ambulatory Visit: Payer: Medicare Other

## 2011-03-27 ENCOUNTER — Ambulatory Visit
Admission: RE | Admit: 2011-03-27 | Discharge: 2011-03-27 | Disposition: A | Discharge status: Home / Self Care | Payer: Medicare Other | Source: Ambulatory Visit | Attending: Neurosurgery | Admitting: Neurosurgery

## 2011-03-27 DIAGNOSIS — M545 Low back pain, unspecified: Secondary | ICD-10-CM

## 2011-05-14 LAB — BASIC METABOLIC PANEL
BUN: 16
CO2: 25
Calcium: 8.6
Calcium: 9.1
Creatinine, Ser: 0.97
GFR calc Af Amer: >60
GFR calc non Af Amer: 52 — ABNORMAL LOW
Glucose, Bld: 106 — ABNORMAL HIGH
Glucose, Bld: 93
Sodium: 140

## 2011-05-14 LAB — COMPREHENSIVE METABOLIC PANEL
AST: 20
Albumin: 3.7
BUN: 20
Calcium: 9.6
Chloride: 102
Creatinine, Ser: 1.03
GFR calc Af Amer: >60
Total Bilirubin: 0.9

## 2011-05-14 LAB — CARDIAC PANEL(CRET KIN+CKTOT+MB+TROPI)
CK, MB: 1
Relative Index: INVALID
Total CK: 62
Total CK: 71
Troponin I: 0.02
Troponin I: 0.03

## 2011-05-14 LAB — LIPID PANEL
LDL Cholesterol: 138 — ABNORMAL HIGH
Total CHOL/HDL Ratio: 4.7
VLDL: 18

## 2011-05-14 LAB — CBC
Hemoglobin: 12
Hemoglobin: 11.9 — ABNORMAL LOW
MCHC: 34.6
Platelets: 173
Platelets: 203
RBC: 3.83 — ABNORMAL LOW
RDW: 13.3
RDW: 13.6
RDW: 13.3
WBC: 5.6

## 2011-05-14 LAB — PROTIME-INR
INR: 1
Prothrombin Time: 13.4

## 2011-05-14 LAB — POCT CARDIAC MARKERS
CKMB, poc: <1 — ABNORMAL LOW
Myoglobin, poc: 98.6
Operator id: 231701
Troponin i, poc: <0.05
Troponin i, poc: <0.05

## 2011-05-14 LAB — DIFFERENTIAL
Basophils Absolute: 0
Lymphocytes Relative: 35
Lymphs Abs: 2
Monocytes Absolute: 0.4
Neutro Abs: 3.1

## 2011-05-14 LAB — TROPONIN I: Troponin I: 0.01

## 2011-05-14 LAB — HEPARIN LEVEL (UNFRACTIONATED): Heparin Unfractionated: 0.59

## 2011-05-19 LAB — POCT I-STAT, CHEM 8
BUN: 28 mg/dL — ABNORMAL HIGH (ref 6–23)
Chloride: 106 mEq/L (ref 96–112)
Creatinine, Ser: 1.3 mg/dL — ABNORMAL HIGH (ref 0.4–1.2)
Sodium: 140 mEq/L (ref 135–145)

## 2011-05-19 LAB — BASIC METABOLIC PANEL
Calcium: 9.2 mg/dL (ref 8.4–10.5)
Calcium: 9.6 mg/dL (ref 8.4–10.5)
Creatinine, Ser: 1.14 mg/dL (ref 0.4–1.2)
GFR calc Af Amer: 57 mL/min — ABNORMAL LOW (ref >60)
GFR calc Af Amer: >60 mL/min (ref >60)
GFR calc non Af Amer: 47 mL/min — ABNORMAL LOW (ref >60)
GFR calc non Af Amer: 53 mL/min — ABNORMAL LOW (ref >60)
Glucose, Bld: 113 mg/dL — ABNORMAL HIGH (ref 70–99)
Sodium: 139 mEq/L (ref 135–145)

## 2011-05-19 LAB — CK TOTAL AND CKMB (NOT AT ARMC)
CK, MB: 1.1 ng/mL (ref 0.3–4.0)
Relative Index: INVALID (ref 0.0–2.5)
Total CK: 65 U/L (ref 7–177)

## 2011-05-19 LAB — DIFFERENTIAL
Basophils Absolute: 0 10*3/uL (ref 0.0–0.1)
Basophils Relative: 0 % (ref 0–1)
Monocytes Absolute: 0.7 10*3/uL (ref 0.1–1.0)
Neutro Abs: 4.2 10*3/uL (ref 1.7–7.7)
Neutrophils Relative %: 44 % (ref 43–77)

## 2011-05-19 LAB — CARDIAC PANEL(CRET KIN+CKTOT+MB+TROPI)
Relative Index: INVALID (ref 0.0–2.5)
Total CK: 48 U/L (ref 7–177)
Total CK: 56 U/L (ref 7–177)

## 2011-05-19 LAB — POCT CARDIAC MARKERS
Myoglobin, poc: 54 ng/mL (ref 12–200)
Troponin i, poc: <0.05 ng/mL (ref 0.00–0.09)

## 2011-05-19 LAB — CBC
RBC: 3.95 MIL/uL (ref 3.87–5.11)
WBC: 9.4 10*3/uL (ref 4.0–10.5)

## 2011-05-19 LAB — TROPONIN I: Troponin I: 0.01 ng/mL (ref 0.00–0.06)

## 2011-06-12 ENCOUNTER — Encounter: Payer: Self-pay | Admitting: Family Medicine

## 2011-06-12 ENCOUNTER — Ambulatory Visit (INDEPENDENT_AMBULATORY_CARE_PROVIDER_SITE_OTHER): Payer: Medicare Other | Admitting: Family Medicine

## 2011-06-12 DIAGNOSIS — R5381 Other malaise: Secondary | ICD-10-CM

## 2011-06-12 DIAGNOSIS — K219 Gastro-esophageal reflux disease without esophagitis: Secondary | ICD-10-CM | POA: Insufficient documentation

## 2011-06-12 DIAGNOSIS — F32A Depression, unspecified: Secondary | ICD-10-CM | POA: Insufficient documentation

## 2011-06-12 DIAGNOSIS — I25118 Atherosclerotic heart disease of native coronary artery with other forms of angina pectoris: Secondary | ICD-10-CM | POA: Insufficient documentation

## 2011-06-12 DIAGNOSIS — R531 Weakness: Secondary | ICD-10-CM

## 2011-06-12 DIAGNOSIS — J329 Chronic sinusitis, unspecified: Secondary | ICD-10-CM

## 2011-06-12 DIAGNOSIS — F341 Dysthymic disorder: Secondary | ICD-10-CM

## 2011-06-12 DIAGNOSIS — R5383 Other fatigue: Secondary | ICD-10-CM

## 2011-06-12 DIAGNOSIS — E890 Postprocedural hypothyroidism: Secondary | ICD-10-CM | POA: Insufficient documentation

## 2011-06-12 DIAGNOSIS — I679 Cerebrovascular disease, unspecified: Secondary | ICD-10-CM

## 2011-06-12 DIAGNOSIS — E039 Hypothyroidism, unspecified: Secondary | ICD-10-CM

## 2011-06-12 DIAGNOSIS — R42 Dizziness and giddiness: Secondary | ICD-10-CM

## 2011-06-12 DIAGNOSIS — I10 Essential (primary) hypertension: Secondary | ICD-10-CM

## 2011-06-12 DIAGNOSIS — I2510 Atherosclerotic heart disease of native coronary artery without angina pectoris: Secondary | ICD-10-CM

## 2011-06-12 DIAGNOSIS — K635 Polyp of colon: Secondary | ICD-10-CM | POA: Insufficient documentation

## 2011-06-12 DIAGNOSIS — E785 Hyperlipidemia, unspecified: Secondary | ICD-10-CM

## 2011-06-12 DIAGNOSIS — R319 Hematuria, unspecified: Secondary | ICD-10-CM

## 2011-06-12 DIAGNOSIS — K449 Diaphragmatic hernia without obstruction or gangrene: Secondary | ICD-10-CM | POA: Insufficient documentation

## 2011-06-12 DIAGNOSIS — F419 Anxiety disorder, unspecified: Secondary | ICD-10-CM

## 2011-06-12 DIAGNOSIS — M549 Dorsalgia, unspecified: Secondary | ICD-10-CM

## 2011-06-12 DIAGNOSIS — T7840XA Allergy, unspecified, initial encounter: Secondary | ICD-10-CM

## 2011-06-12 DIAGNOSIS — M1990 Unspecified osteoarthritis, unspecified site: Secondary | ICD-10-CM | POA: Insufficient documentation

## 2011-06-12 DIAGNOSIS — L98499 Non-pressure chronic ulcer of skin of other sites with unspecified severity: Secondary | ICD-10-CM

## 2011-06-12 DIAGNOSIS — F329 Major depressive disorder, single episode, unspecified: Secondary | ICD-10-CM | POA: Insufficient documentation

## 2011-06-12 DIAGNOSIS — C801 Malignant (primary) neoplasm, unspecified: Secondary | ICD-10-CM | POA: Insufficient documentation

## 2011-06-12 DIAGNOSIS — IMO0002 Reserved for concepts with insufficient information to code with codable children: Secondary | ICD-10-CM

## 2011-06-12 HISTORY — DX: Hypothyroidism, unspecified: E03.9

## 2011-06-12 HISTORY — DX: Atherosclerotic heart disease of native coronary artery without angina pectoris: I25.10

## 2011-06-12 LAB — RENAL FUNCTION PANEL
Albumin: 4.2 g/dL (ref 3.5–5.2)
Chloride: 102 mEq/L (ref 96–112)
GFR: 71.76 mL/min (ref >60.00)
Glucose, Bld: 91 mg/dL (ref 70–99)
Phosphorus: 2.9 mg/dL (ref 2.3–4.6)
Potassium: 3.9 mEq/L (ref 3.5–5.1)
Sodium: 139 mEq/L (ref 135–145)

## 2011-06-12 LAB — HEPATIC FUNCTION PANEL
Alkaline Phosphatase: 73 U/L (ref 39–117)
Bilirubin, Direct: 0.1 mg/dL (ref 0.0–0.3)
Total Protein: 7.7 g/dL (ref 6.0–8.3)

## 2011-06-12 LAB — LIPID PANEL
Cholesterol: 190 mg/dL (ref 0–200)
LDL Cholesterol: 120 mg/dL — ABNORMAL HIGH (ref 0–99)
Total CHOL/HDL Ratio: 4
VLDL: 16.2 mg/dL (ref 0.0–40.0)

## 2011-06-12 LAB — POCT URINALYSIS DIPSTICK
Ketones, UA: NEGATIVE
Protein, UA: NEGATIVE
Spec Grav, UA: 1.02
Urobilinogen, UA: 0.2
pH, UA: 8.5

## 2011-06-12 LAB — CBC
Hemoglobin: 12.7 g/dL (ref 12.0–15.0)
MCV: 91.4 fL (ref 78.0–100.0)
Platelets: 222 10*3/uL (ref 150–400)
RBC: 4.06 MIL/uL (ref 3.87–5.11)
WBC: 7.1 10*3/uL (ref 4.0–10.5)

## 2011-06-12 MED ORDER — SULFAMETHOXAZOLE-TRIMETHOPRIM 800-160 MG PO TABS: 1.0000 | ORAL_TABLET | Freq: Two times a day (BID) | ORAL | Status: DC

## 2011-06-12 MED ORDER — FLUOXETINE HCL 20 MG PO CAPS: ORAL_CAPSULE | ORAL | Status: DC

## 2011-06-12 NOTE — Assessment & Plan Note (Signed)
Asymptomatic at present would like to switch

## 2011-06-12 NOTE — Patient Instructions (Signed)

## 2011-06-12 NOTE — Assessment & Plan Note (Signed)
asymptomatic

## 2011-06-13 ENCOUNTER — Encounter: Payer: Self-pay | Admitting: Family Medicine

## 2011-06-13 DIAGNOSIS — I10 Essential (primary) hypertension: Secondary | ICD-10-CM | POA: Insufficient documentation

## 2011-06-13 DIAGNOSIS — M549 Dorsalgia, unspecified: Secondary | ICD-10-CM | POA: Insufficient documentation

## 2011-06-13 DIAGNOSIS — I679 Cerebrovascular disease, unspecified: Secondary | ICD-10-CM | POA: Insufficient documentation

## 2011-06-13 DIAGNOSIS — R42 Dizziness and giddiness: Secondary | ICD-10-CM | POA: Insufficient documentation

## 2011-06-13 HISTORY — DX: Dizziness and giddiness: R42

## 2011-06-13 HISTORY — DX: Dorsalgia, unspecified: M54.9

## 2011-06-13 NOTE — Assessment & Plan Note (Signed)
Patient reports she is doing well 

## 2011-06-13 NOTE — Assessment & Plan Note (Signed)
Tolerating Hyperlipidemia

## 2011-06-13 NOTE — Assessment & Plan Note (Signed)
She is asked to start a ECASA daily and we will consider further work up in future visits.

## 2011-06-13 NOTE — Assessment & Plan Note (Signed)
Adequately controlled 

## 2011-06-13 NOTE — Assessment & Plan Note (Signed)
Consider daily antihistamine. Nasal Saline prn.

## 2011-06-13 NOTE — Assessment & Plan Note (Signed)
Doing well on current meds, check TSH with next set of blood work

## 2011-06-13 NOTE — Assessment & Plan Note (Signed)
Has a long history of low back pain, suffered a fall in May and has had some trouble since then. Was seen by Dr Franky Macho of Ortho but would like to try somewhere else if her back requires more evaluation

## 2011-06-13 NOTE — Assessment & Plan Note (Signed)
Patient describing episodes of spinning occuring when she turns or moves quickly. She is also showing signs of dehydration, she was encouraged to increase hydration and we will consider further work up if symptoms persist.

## 2011-06-13 NOTE — Progress Notes (Signed)
Colleen Wright [id number] [birth year] 06/13/2011      Progress Note New Patient  Subjective  Chief Complaint  Chief Complaint  Patient presents with  . Establish Care    HPI  Patient is a 70 year old Caucasian female in today for new patient appointment. She has a long past medical history includes coronary artery disease. She is status post a cardiac catheter and angioplasty of her right coronary artery in 2009. She denies any chest pain, palpitations, shortness of breath at this time. Assessment chronic back pain recent MRI this past year did show a lumbar fracture. She is now seen more so recently although she has seen in the past. Pain is manageable at present. She has fallen and hit her head it has been having more trouble with headaches since then. They are minor and there is no associated visual or hearing changes or tinnitus. She is noting she drinks very little and with clear fluids and frequently has episodes of feeling as if she is spinning when she turns her head quickly. No recent illness, fevers, chills, GI or GU complaints.  Past Medical History  Diagnosis Date  . Colon polyps   . Allergy   . Arthritis     low back  . Cancer     thyroid, s/p thyroidectomy 8 years ago  . Depression   . GERD (gastroesophageal reflux disease)   . Hyperlipidemia   . Hypertension   . Ulcer   . Hypothyroidism 06/12/2011  . Hiatal hernia   . CAD (coronary artery disease) 06/12/2011  . Back pain 06/13/2011  . Vertigo 06/13/2011    Past Surgical History  Procedure Date  . Thyroidectomy, partial     twice  . Fracture surgery     left leg, pin and 5 bolts in place in lower tibia  . Tubal ligation     growth removed from posterior vaginal vault    Family History  Problem Relation Age of Onset  . Cancer Mother     ovary/uterus-can't remember  . Stroke Mother   . Heart disease Father   . Heart disease Sister     bypass  . Stroke Sister   . Cancer Sister     LN, bone  .  Hypertension Sister   . Heart disease Brother     bypass  . Cancer Cousin     breast    History   Social History  . Marital Status: Married    Spouse Name: N/A    Number of Children: N/A  . Years of Education: N/A   Occupational History  . Not on file.   Social History Main Topics  . Smoking status: Never Smoker   . Smokeless tobacco: Never Used  . Alcohol Use: No  . Drug Use: No  . Sexually Active: No   Other Topics Concern  . Not on file   Social History Narrative  . No narrative on file    Current Outpatient Prescriptions on File Prior to Visit  Medication Sig Dispense Refill  . simvastatin (ZOCOR) 20 MG tablet Take 1 tablet (20 mg total) by mouth at bedtime.  90 tablet  3    No Known Allergies  Review of Systems  Review of Systems  Constitutional: Negative for fever, chills and malaise/fatigue.  HENT: Negative for hearing loss, ear pain, nosebleeds, congestion and tinnitus.   Eyes: Negative for discharge.  Respiratory: Negative for cough, sputum production, shortness of breath and wheezing.   Cardiovascular: Negative for chest pain,  palpitations and leg swelling.  Gastrointestinal: Negative for heartburn, nausea, vomiting, abdominal pain, diarrhea, constipation and blood in stool.  Genitourinary: Negative for dysuria, urgency, frequency and hematuria.  Musculoskeletal: Positive for back pain. Negative for myalgias and falls.  Skin: Negative for rash.  Neurological: Positive for dizziness and headaches. Negative for tremors, sensory change, focal weakness, loss of consciousness and weakness.  Endo/Heme/Allergies: Negative for polydipsia. Does not bruise/bleed easily.  Psychiatric/Behavioral: Negative for depression and suicidal ideas. The patient is not nervous/anxious and does not have insomnia.     Objective  BP 144/82  Pulse 67  Temp(Src) 97.7 F (36.5 C) (Oral)  Wt 156 lb (70.761 kg)  SpO2 100%  Physical Exam  Physical Exam  Constitutional:  She is oriented to person, place, and time and well-developed, well-nourished, and in no distress. No distress.  HENT:  Head: Normocephalic and atraumatic.  Right Ear: External ear normal.  Left Ear: External ear normal.  Nose: Nose normal.  Mouth/Throat: Oropharynx is clear and moist. No oropharyngeal exudate.  Eyes: Conjunctivae are normal. Pupils are equal, round, and reactive to light. Right eye exhibits no discharge. Left eye exhibits no discharge. No scleral icterus.  Neck: Normal range of motion. Neck supple. No thyromegaly present.  Cardiovascular: Normal rate, regular rhythm, normal heart sounds and intact distal pulses.   No murmur heard. Pulmonary/Chest: Effort normal and breath sounds normal. No respiratory distress. She has no wheezes. She has no rales.  Abdominal: Soft. Bowel sounds are normal. She exhibits no distension and no mass. There is no tenderness.  Musculoskeletal: Normal range of motion. She exhibits no edema and no tenderness.  Lymphadenopathy:    She has no cervical adenopathy.  Neurological: She is alert and oriented to person, place, and time. She has normal reflexes. No cranial nerve deficit. Coordination normal.  Skin: Skin is warm and dry. No rash noted. She is not diaphoretic.  Psychiatric: Mood, memory and affect normal.       Assessment & Plan  Ulcer asymptomatic  CAD (coronary artery disease) Asymptomatic at present would like to switch  Hypothyroidism Doing well on current meds, check TSH with next set of blood work  GERD (gastroesophageal reflux disease) Generally doing well, no need for medicaitons at this time  Cancer Patient reports she is doing well.  Back pain Has a long history of low back pain, suffered a fall in May and has had some trouble since then. Was seen by Dr Franky Macho of Ortho but would like to try somewhere else if her back requires more evaluation  Allergic state Consider daily antihistamine. Nasal Saline prn.    Vertigo Patient describing episodes of spinning occuring when she turns or moves quickly. She is also showing signs of dehydration, she was encouraged to increase hydration and we will consider further work up if symptoms persist.   Cerebrovascular disease, unspecified She is asked to start a ECASA daily and we will consider further work up in future visits.  HTN (hypertension) Adequately controlled  Hyperlipidemia Tolerating Hyperlipidemia

## 2011-06-13 NOTE — Assessment & Plan Note (Addendum)
Generally doing well, no need for medicaitons at this time

## 2011-06-14 LAB — URINE CULTURE

## 2011-06-22 ENCOUNTER — Ambulatory Visit (HOSPITAL_BASED_OUTPATIENT_CLINIC_OR_DEPARTMENT_OTHER)
Admission: RE | Admit: 2011-06-22 | Discharge: 2011-06-22 | Disposition: A | Discharge status: Home / Self Care | Payer: Medicare Other | Source: Ambulatory Visit | Attending: Family Medicine | Admitting: Family Medicine

## 2011-06-22 ENCOUNTER — Ambulatory Visit (INDEPENDENT_AMBULATORY_CARE_PROVIDER_SITE_OTHER): Payer: Medicare Other | Admitting: Family Medicine

## 2011-06-22 ENCOUNTER — Encounter: Payer: Self-pay | Admitting: Neurology

## 2011-06-22 ENCOUNTER — Encounter: Payer: Self-pay | Admitting: Family Medicine

## 2011-06-22 DIAGNOSIS — I10 Essential (primary) hypertension: Secondary | ICD-10-CM

## 2011-06-22 DIAGNOSIS — J329 Chronic sinusitis, unspecified: Secondary | ICD-10-CM

## 2011-06-22 DIAGNOSIS — I679 Cerebrovascular disease, unspecified: Secondary | ICD-10-CM | POA: Insufficient documentation

## 2011-06-22 DIAGNOSIS — R319 Hematuria, unspecified: Secondary | ICD-10-CM

## 2011-06-22 DIAGNOSIS — R42 Dizziness and giddiness: Secondary | ICD-10-CM

## 2011-06-22 LAB — POCT URINALYSIS DIPSTICK
Leukocytes, UA: NEGATIVE
pH, UA: 6

## 2011-06-22 NOTE — Patient Instructions (Signed)
Stroke (Cerebrovascular Accident) A stroke (cerebrovascular accident, CVA) means you have a brain injury from blocked circulation or bleeding in the brain. Blocked circulation usually comes from a clot. RISK FACTORS  High blood pressure (hypertension).   High cholesterol.   Diabetes.   Heart disease.   The buildup of fatty deposits in the blood vessels (peripheral artery disease or atherosclerosis).   An abnormal heart rhythm (atrial fibrillation).   Obesity.   Smoking.   Taking oral contraceptives (especially in combination with smoking).   Physical inactivity.   A diet high in fats, salt (sodium), and calories.   Alcohol use.   Use of illegal drugs (especially cocaine and methamphetamine).   Being a female.   Being an Tree surgeon.   Age over 50.   Family history of stroke.   Previous history of blood clots, a "warning stroke" (transient ischemic attack, TIA), or heart attack.   Sickle cell disease.  SYMPTOMS  The symptoms of a stroke depend on the part of the brain that is affected. It is important to seek treatment within 4 hours of the start of symptoms because you may receive a "clot dissolving" medication that cannot be given after that time. Even if you don't know when your symptoms began, get treatment as soon as possible. Symptoms of a stroke may progress or change over the first several days. Symptoms may include:  Sudden weakness or numbness of the face, arm, or leg, especially on one side of the body.   Sudden confusion.   Trouble speaking (aphasia) or understanding.   Sudden trouble seeing in one or both eyes.   Sudden trouble walking.   Dizziness.   Loss of balance or coordination.   Sudden severe headache with no known cause.  HOME CARE INSTRUCTIONS   Medicines: Aspirin and blood thinners may be used to prevent another stroke. Blood thinners need to be used exactly as instructed. Medicines may also be used to control risk factors for a  stroke. Be sure you understand all your medicine instructions.   Diet: Certain diets may be prescribed to address high blood pressure, high cholesterol, diabetes, or obesity. A diet that includes 5 or more servings of fruits and vegetables a day may reduce the risk of stroke. Foods may need to be a special consistency (soft or pureed), or small bites may need to be taken in order to avoid aspirating or choking.   Maintain a healthy weight.   Stay physically active. It is recommended that you get at least 30 minutes of activity on most or all days.   Do not smoke.   Limit alcohol use.   Stop drug abuse.   Home safety: A safe home environment is important to reduce the risk of falls. Your caregiver may arrange for specialists to evaluate your home. Having grab bars in the bedroom and bathroom is often important. Your caregiver may arrange for special equipment to be used at home, such as raised toilets and a seat for the shower.   Physical, occupational, and speech therapy: Ongoing therapy may be needed to maximize your recovery after a stroke. If you have been advised to use a walker or a cane, use it at all times. Be sure to keep your therapy appointments.   Follow all instructions for follow-up with your caregiver. This is VERY important. This includes any referrals, physical therapy, rehabilitation, and laboratory tests. Proper treatment also prevents another stroke from occurring.  SEEK IMMEDIATE MEDICAL CARE IF:   You have  sudden weakness or numbness of the face, arm, or leg, especially on one side of the body.   You have sudden confusion.   You have trouble speaking (aphasia) or understanding.   You have sudden trouble seeing in one or both eyes.   You have sudden trouble walking.   You have dizziness.   You have loss of balance or coordination.   You have a sudden, severe headache with no known cause.   You have a fever.   You are coughing or have difficulty breathing.     You have new chest pain, angina, or an irregular heartbeat.  Any of these symptoms may represent a serious problem that is an emergency. Do not wait to see if the symptoms will go away. Get medical help at once. Call your local emergency services (911 in [place] Do not drive yourself to the hospital. Document Released: 08/03/2005 Document Revised: 02/16/2011 Document Reviewed: 01/01/2010 [facility] Patient Information 2012 [place], [place].Benign Positional Vertigo Vertigo is a feeling that you are unsteady or that you or your surroundings are moving. Benign positional vertigo (BPV) is the most common form of vertigo. Benign means it does not have a serious cause. BPV is an upset in the balance system in your middle ear. This is troublesome but usually not serious. A viral infection or head injury are common causes, but often no cause is found. It is more common as we grow older. SYMPTOMS  Sudden dizziness happens when you move your head in different directions. Some of the problems that come with this are:  Loss of balance.   Throwing up (vomiting).   Blurred vision.   Dizziness .   Feeling sick to your stomach (nauseated).  DIAGNOSIS  Your caregiver may do some specialized testing to prove what is wrong. HOME CARE INSTRUCTIONS   Rest and eat a well-balanced diet.   Move slowly and do not make sudden body or head movements.   Do not drive a car or do any activities that could hurt you or others.   Lie down and rest. Take precautions to prevent falls.  SEEK IMMEDIATE MEDICAL CARE IF:   You develop headaches which are severe or lasting.   You develop continued vomiting.   A temporary loss or change of vision appears.   You notice temporary numbness on one side of your body.   You are temporarily unable to speak.   Temporary areas of weakness develop.   You have weakness or numbness in the face, arms, or legs.   You notice dizziness or difficulty walking.   You experience  slurred speech or difficulty swallowing.  MAKE SURE YOU:   Understand these instructions.   Will watch your condition.   Will get help right away if you are not doing well or get worse.  Document Released: 05/11/2006 Document Revised: 02/16/2011 Document Reviewed: 05/20/2006 [facility] Patient Information 2012 [place], [place].

## 2011-06-23 ENCOUNTER — Encounter: Payer: Self-pay | Admitting: Family Medicine

## 2011-06-23 NOTE — Assessment & Plan Note (Signed)
Noted on mri in spring has improved with CT scan. Patient stopped TMP/SMX due to nausea/vomit. No reason to restart at this time

## 2011-06-23 NOTE — Assessment & Plan Note (Signed)
Patient is in today c/o persistent disequilibrium which she feels is worse since she hit her head during a fall int he spring of 2012. CT scan does not note any acute changes and just confirms white matter atrophy. Due to her persistnet symptoms will refer to neurology for further evaluation

## 2011-06-23 NOTE — Assessment & Plan Note (Signed)
Well controlled at today's visit. No change in medications

## 2011-06-23 NOTE — Progress Notes (Signed)
Colleen Wright [id number] [birth year] 06/23/2011      Progress Note-Follow Up  Subjective  Chief Complaint  Chief Complaint  Patient presents with  . HEAD FEELS "DRUNK"    STOPPED TAKING SEPTRA ON 06-19-11    HPI  Patient is a 70 year old Caucasian female who is here today in followup of her new patient appointment. At her new patient appointment for complaints were largely neurologic. She was having a lot of vertiginous symptoms with disequilibrium. No acute spinning. No headache, fevers, chills. She is complaining of persistent clear rhinorrhea but denies significant nasal congestion, cough, but acute illness. At her last visit MRI of the head was reviewed and it was noted she had some sinusitis earlier in the year has been having increase in the symptoms since then so she was treated with Septra. She stopped it do to nausea and vomiting. No chest pain, palpitations or, shortness of breath, GI or GU complaints noted today.  Past Medical History  Diagnosis Date  . Colon polyps   . Allergy   . Arthritis     low back  . Cancer     thyroid, s/p thyroidectomy 8 years ago  . Depression   . GERD (gastroesophageal reflux disease)   . Hyperlipidemia   . Hypertension   . Ulcer   . Hypothyroidism 06/12/2011  . Hiatal hernia   . CAD (coronary artery disease) 06/12/2011  . Back pain 06/13/2011  . Vertigo 06/13/2011    Past Surgical History  Procedure Date  . Thyroidectomy, partial     twice  . Fracture surgery     left leg, pin and 5 bolts in place in lower tibia  . Tubal ligation     growth removed from posterior vaginal vault    Family History  Problem Relation Age of Onset  . Cancer Mother     ovary/uterus-can't remember  . Stroke Mother   . Heart disease Father   . Heart disease Sister     bypass  . Stroke Sister   . Cancer Sister     LN, bone  . Hypertension Sister   . Heart disease Brother     bypass  . Cancer Cousin     breast    History   Social History    . Marital Status: Married    Spouse Name: N/A    Number of Children: N/A  . Years of Education: N/A   Occupational History  . Not on file.   Social History Main Topics  . Smoking status: Never Smoker   . Smokeless tobacco: Never Used  . Alcohol Use: No  . Drug Use: No  . Sexually Active: No   Other Topics Concern  . Not on file   Social History Narrative  . No narrative on file    Current Outpatient Prescriptions on File Prior to Visit  Medication Sig Dispense Refill  . ALPRAZolam (XANAX) 0.5 MG tablet Take 0.25 mg by mouth at bedtime as needed.        Marland Kitchen FLUoxetine (PROZAC) 20 MG capsule 1 tab po weekly  4 capsule  2  . levothyroxine (SYNTHROID, LEVOTHROID) 112 MCG tablet Take 1 tablet by mouth daily.       Marland Kitchen losartan-hydrochlorothiazide (HYZAAR) 100-12.5 MG per tablet Take 1 tablet by mouth daily.       Marland Kitchen PREMPRO 0.625-2.5 MG per tablet Take 1 tablet by mouth daily. Pt only takes once weekly      . simvastatin (ZOCOR) 20 MG tablet  Take 1 tablet (20 mg total) by mouth at bedtime.  90 tablet  3  . zolpidem (AMBIEN) 10 MG tablet Take 1 tablet by mouth at bedtime.         No Known Allergies  Review of Systems  Review of Systems  Constitutional: Negative for fever and malaise/fatigue.  HENT: Positive for congestion.   Eyes: Negative for discharge.  Respiratory: Positive for sputum production. Negative for cough and shortness of breath.        Clear rhinorrhea  Cardiovascular: Negative for chest pain, palpitations and leg swelling.  Gastrointestinal: Negative for nausea, vomiting, abdominal pain, diarrhea, constipation, blood in stool and melena.  Genitourinary: Negative for dysuria.  Musculoskeletal: Negative for falls.  Skin: Negative for rash.  Neurological: Positive for dizziness. Negative for sensory change, speech change, focal weakness, seizures, loss of consciousness and headaches.  Endo/Heme/Allergies: Negative for polydipsia.  Psychiatric/Behavioral: Negative  for depression and suicidal ideas. The patient is not nervous/anxious and does not have insomnia.     Objective  BP 124/78  Pulse 83  Temp(Src) 98.2 F (36.8 C) (Oral)  Ht 5\' 2"  (1.575 m)  Wt 151 lb 12.8 oz (68.856 kg)  BMI 27.76 kg/m2  SpO2 98%  Physical Exam  Physical Exam  Constitutional: She is oriented to person, place, and time and well-developed, well-nourished, and in no distress. No distress.  HENT:  Head: Normocephalic and atraumatic.  Eyes: Conjunctivae are normal.  Neck: Neck supple. No thyromegaly present.  Cardiovascular: Normal rate, regular rhythm and normal heart sounds.   No murmur heard. Pulmonary/Chest: Effort normal and breath sounds normal. She has no wheezes.  Abdominal: She exhibits no distension and no mass.  Musculoskeletal: She exhibits no edema.  Lymphadenopathy:    She has no cervical adenopathy.  Neurological: She is alert and oriented to person, place, and time. She has normal reflexes. No cranial nerve deficit. She exhibits normal muscle tone. Gait normal. Coordination normal.       Stumbles slightly while getting on exam table  Skin: Skin is warm and dry. No rash noted. She is not diaphoretic.  Psychiatric: Memory, affect and judgment normal.    Lab Results  Component Value Date   TSH 0.64 06/12/2011   Lab Results  Component Value Date   WBC 7.1 06/12/2011   HGB 12.7 06/12/2011   HCT 37.1 06/12/2011   MCV 91.4 06/12/2011   PLT 222 06/12/2011   Lab Results  Component Value Date   CREATININE 0.8 06/12/2011   BUN 20 06/12/2011   NA 139 06/12/2011   K 3.9 06/12/2011   CL 102 06/12/2011   CO2 29 06/12/2011   Lab Results  Component Value Date   ALT 12 06/12/2011   AST 18 06/12/2011   ALKPHOS 73 06/12/2011   BILITOT 0.7 06/12/2011   Lab Results  Component Value Date   CHOL 190 06/12/2011   Lab Results  Component Value Date   HDL 54.00 06/12/2011   Lab Results  Component Value Date   LDLCALC 120* 06/12/2011   Lab  Results  Component Value Date   TRIG 81.0 06/12/2011   Lab Results  Component Value Date   CHOLHDL 4 06/12/2011     Assessment & Plan  HTN (hypertension) Well controlled at today's visit. No change in medications  Cerebrovascular disease, unspecified Patient is in today c/o persistent disequilibrium which she feels is worse since she hit her head during a fall int he spring of 2012. CT scan does not note  any acute changes and just confirms white matter atrophy. Due to her persistnet symptoms will refer to neurology for further evaluation  Sinusitis Noted on mri in spring has improved with CT scan. Patient stopped TMP/SMX due to nausea/vomit. No reason to restart at this time

## 2011-07-13 ENCOUNTER — Telehealth: Payer: Self-pay | Admitting: Family Medicine

## 2011-07-13 MED ORDER — AZITHROMYCIN 250 MG PO TABS: ORAL_TABLET | ORAL | Status: DC

## 2011-07-13 NOTE — Telephone Encounter (Signed)
Please advise 

## 2011-07-13 NOTE — Telephone Encounter (Signed)
Internet down need to fax RX

## 2011-07-13 NOTE — Telephone Encounter (Signed)
Pt informed and RX sent 

## 2011-07-13 NOTE — Telephone Encounter (Signed)
Yes, Azithromycin 250 mg 2 tabs po once and then 1 tab po daily x 4 days, dips #6 and make sure she is taking a probiotic daily as well

## 2011-07-13 NOTE — Telephone Encounter (Signed)
Addended by: Court Joy on: 07/13/2011 03:38 PM   Modules accepted: Orders

## 2011-07-13 NOTE — Telephone Encounter (Signed)
Patient stopped taking the other antibiotic since it was making her sick, can a zpak be called into [facility] in [place]?

## 2011-07-20 ENCOUNTER — Other Ambulatory Visit: Payer: Self-pay | Admitting: Cardiology

## 2011-07-20 NOTE — Telephone Encounter (Signed)
Refilled hyzaar one time

## 2011-07-21 ENCOUNTER — Ambulatory Visit: Payer: Medicare Other | Admitting: Neurology

## 2011-09-21 ENCOUNTER — Ambulatory Visit: Payer: Medicare Other | Admitting: Neurology

## 2011-11-25 ENCOUNTER — Other Ambulatory Visit: Payer: Self-pay | Admitting: Family Medicine

## 2011-11-25 MED ORDER — LEVOTHYROXINE SODIUM 112 MCG PO TABS: 112.0000 ug | ORAL_TABLET | Freq: Every day | ORAL | Status: DC

## 2011-11-25 NOTE — Telephone Encounter (Signed)
Patients RX sent to pharmacy

## 2011-12-18 ENCOUNTER — Ambulatory Visit: Payer: Medicare Other | Admitting: Family Medicine

## 2012-01-25 ENCOUNTER — Encounter: Payer: Self-pay | Admitting: Family Medicine

## 2012-01-25 ENCOUNTER — Ambulatory Visit (INDEPENDENT_AMBULATORY_CARE_PROVIDER_SITE_OTHER): Payer: Medicare Other | Admitting: Family Medicine

## 2012-01-25 VITALS — BP 104/69 | HR 93 | Temp 98.6°F | Ht 62.0 in | Wt 154.8 lb

## 2012-01-25 DIAGNOSIS — R809 Proteinuria, unspecified: Secondary | ICD-10-CM

## 2012-01-25 DIAGNOSIS — I679 Cerebrovascular disease, unspecified: Secondary | ICD-10-CM

## 2012-01-25 DIAGNOSIS — F329 Major depressive disorder, single episode, unspecified: Secondary | ICD-10-CM

## 2012-01-25 DIAGNOSIS — R5381 Other malaise: Secondary | ICD-10-CM | POA: Diagnosis not present

## 2012-01-25 DIAGNOSIS — R42 Dizziness and giddiness: Secondary | ICD-10-CM | POA: Diagnosis not present

## 2012-01-25 DIAGNOSIS — R5383 Other fatigue: Secondary | ICD-10-CM

## 2012-01-25 DIAGNOSIS — I2510 Atherosclerotic heart disease of native coronary artery without angina pectoris: Secondary | ICD-10-CM

## 2012-01-25 DIAGNOSIS — N951 Menopausal and female climacteric states: Secondary | ICD-10-CM

## 2012-01-25 DIAGNOSIS — I10 Essential (primary) hypertension: Secondary | ICD-10-CM | POA: Diagnosis not present

## 2012-01-25 DIAGNOSIS — F32A Depression, unspecified: Secondary | ICD-10-CM

## 2012-01-25 DIAGNOSIS — F3289 Other specified depressive episodes: Secondary | ICD-10-CM

## 2012-01-25 DIAGNOSIS — E039 Hypothyroidism, unspecified: Secondary | ICD-10-CM

## 2012-01-25 DIAGNOSIS — R079 Chest pain, unspecified: Secondary | ICD-10-CM | POA: Diagnosis not present

## 2012-01-25 LAB — CBC
HCT: 38.8 % (ref 36.0–46.0)
Hemoglobin: 13 g/dL (ref 12.0–15.0)
MCV: 93.1 fl (ref 78.0–100.0)
Platelets: 150 10*3/uL (ref 150.0–400.0)
RBC: 4.16 Mil/uL (ref 3.87–5.11)

## 2012-01-25 LAB — POCT URINALYSIS DIPSTICK
Blood, UA: NEGATIVE
Ketones, UA: 15
Nitrite, UA: NEGATIVE
Protein, UA: 100
pH, UA: 6

## 2012-01-25 LAB — RENAL FUNCTION PANEL
Albumin: 3.9 g/dL (ref 3.5–5.2)
GFR: 39.51 mL/min — ABNORMAL LOW (ref >60.00)
Glucose, Bld: 93 mg/dL (ref 70–99)
Phosphorus: 2.2 mg/dL — ABNORMAL LOW (ref 2.3–4.6)
Potassium: 4.1 mEq/L (ref 3.5–5.1)
Sodium: 138 mEq/L (ref 135–145)

## 2012-01-25 LAB — HEPATIC FUNCTION PANEL
ALT: 22 U/L (ref 0–35)
Albumin: 3.9 g/dL (ref 3.5–5.2)
Bilirubin, Direct: 0.1 mg/dL (ref 0.0–0.3)
Total Protein: 7.1 g/dL (ref 6.0–8.3)

## 2012-01-25 LAB — SEDIMENTATION RATE: Sed Rate: 33 mm/hr — ABNORMAL HIGH (ref 0–22)

## 2012-01-25 MED ORDER — MECLIZINE HCL 32 MG PO TABS
32.0000 mg | ORAL_TABLET | Freq: Three times a day (TID) | ORAL | Status: DC | PRN
Start: 2012-01-25 — End: 2012-01-25

## 2012-01-25 MED ORDER — ASPIRIN EC 325 MG PO TBEC: 325.0000 mg | DELAYED_RELEASE_TABLET | Freq: Every day | ORAL | Status: AC

## 2012-01-25 MED ORDER — CONJ ESTROG-MEDROXYPROGEST ACE 0.625-2.5 MG PO TABS: ORAL_TABLET | ORAL | Status: DC

## 2012-01-25 MED ORDER — MECLIZINE HCL 25 MG PO TABS: 25.0000 mg | ORAL_TABLET | Freq: Three times a day (TID) | ORAL | Status: DC | PRN

## 2012-01-25 MED ORDER — MECLIZINE HCL 32 MG PO TABS: 32.0000 mg | ORAL_TABLET | Freq: Three times a day (TID) | ORAL | Status: DC | PRN

## 2012-01-25 MED ORDER — ALPRAZOLAM 0.5 MG PO TABS: 0.2500 mg | ORAL_TABLET | Freq: Every evening | ORAL | Status: DC | PRN

## 2012-01-25 NOTE — Progress Notes (Signed)
Addended by: Court Joy on: 01/25/2012 04:32 PM   Modules accepted: Orders

## 2012-01-25 NOTE — Progress Notes (Signed)
Patient ID: Colleen Wright, female   DOB: Oct 25, 1940, 71 y.o.   MRN: [id number] Colleen Wright [id number] Sep 22, 1940 01/25/2012      Progress Note-Follow Up  Subjective  Chief Complaint  Chief Complaint  Patient presents with  . Otitis Media    inner ear- not feeling well? feels drunk X 5 days  . Dizziness    HPI  Patient is a 71 year old Caucasian female who is in today for multiple concerns. She'll long history of vertigo intermittently. She notes frequently upon arising quickly or in the morning she has a spinning sensation which is better she moves slowly. However about 10 days ago she had an episode while she was on her back on [place] feeling almost as if she could pass out and lightheaded. No queasy but did not vomit. When an MI down in the intense feeling of lightheadedness went away but she's not felt well since. She continues in use to have excessive fatigue. She notes she has some intermittent very mild chest pain occurring. She says it is similar but much less intense and less frequent than the chest pain she had back in 2009 she had a blockage. She denies shortness of breath, palpitations, diaphoresis. Has had some intermittent episodes of nausea but no vomiting. Denies anorexia last week but does note since the weekend roughly 2 days ago her appetite to diminish some. She denies any fevers chills, headache. She occasionally gets a tendinitis versus tripping in her right ear but none at present. In the office she has not had any nausea, chest pain or vertigo. She's had no recent change in bowel or bladder habits. She stopped her fluoxetine and she did not think was helping. She stopped her Prempro and is once a week. She takes alprazolam infrequently and takes her losartan and her levothyroxine daily.  Past Medical History  Diagnosis Date  . Colon polyps   . Allergy   . Arthritis     low back  . Cancer     thyroid, s/p thyroidectomy 8 years ago  . Depression   . GERD  (gastroesophageal reflux disease)   . Hyperlipidemia   . Hypertension   . Ulcer   . Hypothyroidism 06/12/2011  . Hiatal hernia   . CAD (coronary artery disease) 06/12/2011  . Back pain 06/13/2011  . Vertigo 06/13/2011  . Leg fracture   . Chest pain   . Thyroid cancer     Past Surgical History  Procedure Date  . Thyroidectomy, partial     twice  . Fracture surgery     left leg, pin and 5 bolts in place in lower tibia  . Tubal ligation     growth removed from posterior vaginal vault  . Lesion removal     vaginal    Family History  Problem Relation Age of Onset  . Cancer Mother     ovary/uterus-can't remember  . Stroke Mother   . Heart disease Father   . Heart disease Sister     bypass  . Stroke Sister   . Cancer Sister     LN, bone  . Hypertension Sister   . Heart disease Brother     bypass  . Cancer Cousin     breast    History   Social History  . Marital Status: Married    Spouse Name: N/A    Number of Children: N/A  . Years of Education: N/A   Occupational History  . Not on file.  Social History Main Topics  . Smoking status: Never Smoker   . Smokeless tobacco: Never Used  . Alcohol Use: No  . Drug Use: No  . Sexually Active: No   Other Topics Concern  . Not on file   Social History Narrative  . No narrative on file    Current Outpatient Prescriptions on File Prior to Visit  Medication Sig Dispense Refill  . levothyroxine (SYNTHROID, LEVOTHROID) 112 MCG tablet Take 1 tablet (112 mcg total) by mouth daily.  30 tablet  2  . losartan-hydrochlorothiazide (HYZAAR) 100-12.5 MG per tablet TAKE ONE TABLET BY MOUTH DAILY  90 tablet  0  . zolpidem (AMBIEN) 10 MG tablet Take 0.25 mg by mouth at bedtime.       Marland Kitchen DISCONTD: PREMPRO 0.625-2.5 MG per tablet Take 1 tablet by mouth daily. Pt only takes once weekly        No Known Allergies  Review of Systems  Review of Systems  Constitutional: Positive for malaise/fatigue. Negative for fever.  HENT:  Negative for hearing loss and congestion.   Eyes: Negative for discharge.  Respiratory: Negative for shortness of breath.   Cardiovascular: Positive for chest pain. Negative for palpitations and leg swelling.  Gastrointestinal: Negative for nausea, abdominal pain and diarrhea.  Genitourinary: Negative for dysuria.  Musculoskeletal: Negative for falls.  Skin: Negative for rash.  Neurological: Positive for dizziness. Negative for loss of consciousness and headaches.  Endo/Heme/Allergies: Negative for polydipsia.  Psychiatric/Behavioral: Negative for depression and suicidal ideas. The patient is not nervous/anxious and does not have insomnia.     Objective  BP 104/69  Pulse 93  Temp(Src) 98.6 F (37 C) (Temporal)  Ht 5\' 2"  (1.575 m)  Wt 154 lb 12.8 oz (70.217 kg)  BMI 28.31 kg/m2  SpO2 99%  Physical Exam  Physical Exam  Constitutional: She is oriented to person, place, and time and well-developed, well-nourished, and in no distress. No distress.  HENT:  Head: Normocephalic and atraumatic.  Eyes: Conjunctivae are normal.  Neck: Neck supple. No thyromegaly present.  Cardiovascular: Normal rate, regular rhythm and normal heart sounds.   No murmur heard. Pulmonary/Chest: Effort normal and breath sounds normal. She has no wheezes.  Abdominal: She exhibits no distension and no mass.  Musculoskeletal: She exhibits no edema.  Lymphadenopathy:    She has no cervical adenopathy.  Neurological: She is alert and oriented to person, place, and time. She has normal reflexes. No cranial nerve deficit. Gait normal. Coordination normal. GCS score is 15.  Skin: Skin is warm and dry. No rash noted. She is not diaphoretic.  Psychiatric: Memory, affect and judgment normal.    Lab Results  Component Value Date   TSH 0.64 06/12/2011   Lab Results  Component Value Date   WBC 7.1 06/12/2011   HGB 12.7 06/12/2011   HCT 37.1 06/12/2011   MCV 91.4 06/12/2011   PLT 222 06/12/2011   Lab  Results  Component Value Date   CREATININE 0.8 06/12/2011   BUN 20 06/12/2011   NA 139 06/12/2011   K 3.9 06/12/2011   CL 102 06/12/2011   CO2 29 06/12/2011   Lab Results  Component Value Date   ALT 12 06/12/2011   AST 18 06/12/2011   ALKPHOS 73 06/12/2011   BILITOT 0.7 06/12/2011   Lab Results  Component Value Date   CHOL 190 06/12/2011   Lab Results  Component Value Date   HDL 54.00 06/12/2011   Lab Results  Component Value Date  LDLCALC 120* 06/12/2011   Lab Results  Component Value Date   TRIG 81.0 06/12/2011   Lab Results  Component Value Date   CHOLHDL 4 06/12/2011     Assessment & Plan  CAD (coronary artery disease) Patient is reporting recurrence of some substernal chest discomfort. She reports it is fleeting and much less intense than what she had in 2009 but similar in quality. She denies diaphoresis, palpitations, sob but does note increasing fatigue. She is given a referral to cardiology to further evaluate and encouraged to continue her ECASA 325 mg daily  HTN (hypertension) Adequately controlled today. Continue same dose of Losartan hct for now, check labs  Vertigo Has a long history of intermittent episodes and had an intense episode about 1 1/2 weeks ago with near syncope and just feels she has not been right since. She reports some fatigue and vertigo upon arising which is more intense. She is clinically dehydrated today and admits to drinking very few fluids, encouraged to up her fluid intake and consider a glass of Gatorade when she exerts herself especially in hot weather which is what brought on the previous episode. Check labs today  Hypothyroidism Check a TSH today  Cerebrovascular disease, unspecified Will continue to monitor, continue aspirin, patient hesitant to accept referral to numerous specialists today  Depression Stopped her Fluoxetine because she felt it was contributing to her fatigue and vertigo but her symptoms have not  improved. Continues Alprazolam prn infrequently

## 2012-01-25 NOTE — Patient Instructions (Signed)
Vertigo Vertigo means you feel like you or your surroundings are moving when they are not. Vertigo can be dangerous if it occurs when you are at work, driving, or performing difficult activities.  CAUSES  Vertigo occurs when there is a conflict of signals sent to your brain from the visual and sensory systems in your body. There are many different causes of vertigo, including:  Infections, especially in the inner ear.   A bad reaction to a drug or misuse of alcohol and medicines.   Withdrawal from drugs or alcohol.   Rapidly changing positions, such as lying down or rolling over in bed.   A migraine headache.   Decreased blood flow to the brain.   Increased pressure in the brain from a head injury, infection, tumor, or bleeding.  SYMPTOMS  You may feel as though the world is spinning around or you are falling to the ground. Because your balance is upset, vertigo can cause nausea and vomiting. You may have involuntary eye movements (nystagmus). DIAGNOSIS  Vertigo is usually diagnosed by physical exam. If the cause of your vertigo is unknown, your caregiver may perform imaging tests, such as an MRI scan (magnetic resonance imaging). TREATMENT  Most cases of vertigo resolve on their own, without treatment. Depending on the cause, your caregiver may prescribe certain medicines. If your vertigo is related to body position issues, your caregiver may recommend movements or procedures to correct the problem. In rare cases, if your vertigo is caused by certain inner ear problems, you may need surgery. HOME CARE INSTRUCTIONS   Follow your caregiver's instructions.   Avoid driving.   Avoid operating heavy machinery.   Avoid performing any tasks that would be dangerous to you or others during a vertigo episode.   Tell your caregiver if you notice that certain medicines seem to be causing your vertigo. Some of the medicines used to treat vertigo episodes can actually make them worse in some  people.  SEEK IMMEDIATE MEDICAL CARE IF:   Your medicines do not relieve your vertigo or are making it worse.   You develop problems with talking, walking, weakness, or using your arms, hands, or legs.   You develop severe headaches.   Your nausea or vomiting continues or gets worse.   You develop visual changes.   A family member notices behavioral changes.   Your condition gets worse.  MAKE SURE YOU:  Understand these instructions.   Will watch your condition.   Will get help right away if you are not doing well or get worse.  Document Released: 05/13/2005 Document Revised: 07/23/2011 Document Reviewed: 02/19/2011 [employer] Patient Information 2012 [employer]. 

## 2012-01-25 NOTE — Progress Notes (Signed)
Addended by: Baldemar Lenis R on: 01/25/2012 02:26 PM   Modules accepted: Orders

## 2012-01-25 NOTE — Assessment & Plan Note (Signed)
Adequately controlled today. Continue same dose of Losartan hct for now, check labs

## 2012-01-25 NOTE — Progress Notes (Signed)
Addended by: Baldemar Lenis R on: 01/25/2012 04:09 PM   Modules accepted: Orders

## 2012-01-25 NOTE — Assessment & Plan Note (Signed)
Patient is reporting recurrence of some substernal chest discomfort. She reports it is fleeting and much less intense than what she had in 2009 but similar in quality. She denies diaphoresis, palpitations, sob but does note increasing fatigue. She is given a referral to cardiology to further evaluate and encouraged to continue her ECASA 325 mg daily

## 2012-01-25 NOTE — Assessment & Plan Note (Signed)
Will continue to monitor, continue aspirin, patient hesitant to accept referral to numerous specialists today

## 2012-01-25 NOTE — Assessment & Plan Note (Signed)
Has a long history of intermittent episodes and had an intense episode about 1 1/2 weeks ago with near syncope and just feels she has not been right since. She reports some fatigue and vertigo upon arising which is more intense. She is clinically dehydrated today and admits to drinking very few fluids, encouraged to up her fluid intake and consider a glass of Gatorade when she exerts herself especially in hot weather which is what brought on the previous episode. Check labs today

## 2012-01-25 NOTE — Assessment & Plan Note (Signed)
Check a TSH today

## 2012-01-25 NOTE — Assessment & Plan Note (Signed)
Stopped her Fluoxetine because she felt it was contributing to her fatigue and vertigo but her symptoms have not improved. Continues Alprazolam prn infrequently

## 2012-01-26 MED ORDER — CEFDINIR 300 MG PO CAPS: 300.0000 mg | ORAL_CAPSULE | Freq: Two times a day (BID) | ORAL | Status: DC

## 2012-01-26 NOTE — Progress Notes (Signed)
Addended by: Court Joy on: 01/26/2012 11:48 AM   Modules accepted: Orders

## 2012-01-26 NOTE — Progress Notes (Signed)
RX sent to pharmacy  

## 2012-01-28 ENCOUNTER — Institutional Professional Consult (permissible substitution): Payer: Medicare Other | Admitting: Cardiovascular Disease

## 2012-01-28 LAB — URINE CULTURE: Colony Count: 35000

## 2012-01-29 ENCOUNTER — Telehealth: Payer: Self-pay

## 2012-01-29 NOTE — Telephone Encounter (Signed)
Except for UTI labs look good. The slight bump in creatinine is related to UTI

## 2012-01-29 NOTE — Telephone Encounter (Signed)
Patient would like to know her lab results from June 10th? Please advise

## 2012-01-29 NOTE — Telephone Encounter (Signed)
Left a detailed message.

## 2012-02-02 ENCOUNTER — Telehealth: Payer: Self-pay

## 2012-02-02 DIAGNOSIS — R5383 Other fatigue: Secondary | ICD-10-CM

## 2012-02-02 DIAGNOSIS — N951 Menopausal and female climacteric states: Secondary | ICD-10-CM

## 2012-02-02 MED ORDER — LEVOTHYROXINE SODIUM 112 MCG PO TABS: 112.0000 ug | ORAL_TABLET | Freq: Every day | ORAL | Status: DC

## 2012-02-02 MED ORDER — ALPRAZOLAM 0.5 MG PO TABS: 0.2500 mg | ORAL_TABLET | Freq: Every evening | ORAL | Status: DC | PRN

## 2012-02-02 NOTE — Telephone Encounter (Signed)
agree

## 2012-02-02 NOTE — Telephone Encounter (Signed)
Discussed Alprazolam refill with Dr Abner Greenspan. Per MD refill [phone] with 2 refills

## 2012-02-07 ENCOUNTER — Emergency Department (HOSPITAL_BASED_OUTPATIENT_CLINIC_OR_DEPARTMENT_OTHER): Payer: Medicare Other

## 2012-02-07 ENCOUNTER — Observation Stay (HOSPITAL_BASED_OUTPATIENT_CLINIC_OR_DEPARTMENT_OTHER)
Admission: EM | Admit: 2012-02-07 | Discharge: 2012-02-08 | Disposition: A | Discharge status: Home / Self Care | Payer: Medicare Other | Attending: Cardiovascular Disease | Admitting: Cardiovascular Disease

## 2012-02-07 ENCOUNTER — Encounter (HOSPITAL_BASED_OUTPATIENT_CLINIC_OR_DEPARTMENT_OTHER): Payer: Self-pay | Admitting: *Deleted

## 2012-02-07 DIAGNOSIS — R110 Nausea: Secondary | ICD-10-CM | POA: Diagnosis not present

## 2012-02-07 DIAGNOSIS — J984 Other disorders of lung: Secondary | ICD-10-CM | POA: Diagnosis not present

## 2012-02-07 DIAGNOSIS — R42 Dizziness and giddiness: Secondary | ICD-10-CM | POA: Diagnosis not present

## 2012-02-07 DIAGNOSIS — R0789 Other chest pain: Secondary | ICD-10-CM | POA: Diagnosis not present

## 2012-02-07 DIAGNOSIS — R5383 Other fatigue: Secondary | ICD-10-CM | POA: Diagnosis not present

## 2012-02-07 DIAGNOSIS — I10 Essential (primary) hypertension: Secondary | ICD-10-CM | POA: Diagnosis not present

## 2012-02-07 DIAGNOSIS — E785 Hyperlipidemia, unspecified: Secondary | ICD-10-CM | POA: Diagnosis present

## 2012-02-07 DIAGNOSIS — R0609 Other forms of dyspnea: Secondary | ICD-10-CM | POA: Diagnosis not present

## 2012-02-07 DIAGNOSIS — I2510 Atherosclerotic heart disease of native coronary artery without angina pectoris: Secondary | ICD-10-CM

## 2012-02-07 DIAGNOSIS — R0989 Other specified symptoms and signs involving the circulatory and respiratory systems: Secondary | ICD-10-CM | POA: Insufficient documentation

## 2012-02-07 DIAGNOSIS — R5381 Other malaise: Secondary | ICD-10-CM | POA: Insufficient documentation

## 2012-02-07 DIAGNOSIS — N951 Menopausal and female climacteric states: Secondary | ICD-10-CM

## 2012-02-07 DIAGNOSIS — I25118 Atherosclerotic heart disease of native coronary artery with other forms of angina pectoris: Secondary | ICD-10-CM | POA: Diagnosis present

## 2012-02-07 DIAGNOSIS — E890 Postprocedural hypothyroidism: Secondary | ICD-10-CM | POA: Diagnosis present

## 2012-02-07 DIAGNOSIS — R079 Chest pain, unspecified: Secondary | ICD-10-CM | POA: Diagnosis not present

## 2012-02-07 DIAGNOSIS — R55 Syncope and collapse: Secondary | ICD-10-CM | POA: Diagnosis not present

## 2012-02-07 DIAGNOSIS — R209 Unspecified disturbances of skin sensation: Secondary | ICD-10-CM | POA: Diagnosis not present

## 2012-02-07 LAB — URINALYSIS, ROUTINE W REFLEX MICROSCOPIC
Leukocytes, UA: NEGATIVE
Nitrite: NEGATIVE
Protein, ur: NEGATIVE mg/dL
Urobilinogen, UA: 0.2 mg/dL (ref 0.0–1.0)

## 2012-02-07 LAB — BASIC METABOLIC PANEL
Chloride: 101 mEq/L (ref 96–112)
GFR calc Af Amer: 64 mL/min — ABNORMAL LOW (ref >90)
GFR calc non Af Amer: 55 mL/min — ABNORMAL LOW (ref >90)
Potassium: 4.1 mEq/L (ref 3.5–5.1)
Sodium: 139 mEq/L (ref 135–145)

## 2012-02-07 LAB — CBC
HCT: 35.6 % — ABNORMAL LOW (ref 36.0–46.0)
Hemoglobin: 12 g/dL (ref 12.0–15.0)
MCH: 31.4 pg (ref 26.0–34.0)
MCHC: 35.2 g/dL (ref 30.0–36.0)
Platelets: 230 10*3/uL (ref 150–400)
RBC: 3.98 MIL/uL (ref 3.87–5.11)
RBC: 4 MIL/uL (ref 3.87–5.11)
WBC: 6.7 10*3/uL (ref 4.0–10.5)

## 2012-02-07 LAB — TROPONIN I: Troponin I: <0.3 ng/mL (ref <0.30)

## 2012-02-07 LAB — DIFFERENTIAL
Basophils Absolute: 0 10*3/uL (ref 0.0–0.1)
Basophils Relative: 0 % (ref 0–1)
Eosinophils Absolute: 0.2 10*3/uL (ref 0.0–0.7)
Lymphocytes Relative: 39 % (ref 12–46)
Lymphs Abs: 2.2 10*3/uL (ref 0.7–4.0)
Monocytes Absolute: 0.5 10*3/uL (ref 0.1–1.0)
Monocytes Relative: 7 % (ref 3–12)
Neutro Abs: 4.5 10*3/uL (ref 1.7–7.7)
Neutro Abs: 3.4 10*3/uL (ref 1.7–7.7)
Neutrophils Relative %: 61 % (ref 43–77)

## 2012-02-07 MED ORDER — LOSARTAN POTASSIUM 50 MG PO TABS
100.0000 mg | ORAL_TABLET | Freq: Every day | ORAL | Status: DC
  Administered 2012-02-08: 100 mg via ORAL
  Filled 2012-02-07: qty 2

## 2012-02-07 MED ORDER — ASPIRIN 325 MG PO TABS
325.0000 mg | ORAL_TABLET | Freq: Every day | ORAL | Status: DC
  Administered 2012-02-08: 325 mg via ORAL
  Filled 2012-02-07: qty 1

## 2012-02-07 MED ORDER — ASPIRIN 81 MG PO CHEW: 324.0000 mg | CHEWABLE_TABLET | ORAL | Status: DC

## 2012-02-07 MED ORDER — METOPROLOL TARTRATE 25 MG PO TABS
25.0000 mg | ORAL_TABLET | Freq: Two times a day (BID) | ORAL | Status: DC
  Administered 2012-02-07 – 2012-02-08 (×2): 25 mg via ORAL
  Filled 2012-02-07 (×3): qty 1

## 2012-02-07 MED ORDER — ASPIRIN 300 MG RE SUPP
300.0000 mg | RECTAL | Status: DC
  Filled 2012-02-07: qty 1

## 2012-02-07 MED ORDER — ACETAMINOPHEN 325 MG PO TABS: 650.0000 mg | ORAL_TABLET | ORAL | Status: DC | PRN

## 2012-02-07 MED ORDER — LOSARTAN POTASSIUM-HCTZ 100-12.5 MG PO TABS: 1.0000 | ORAL_TABLET | Freq: Every day | ORAL | Status: DC

## 2012-02-07 MED ORDER — ASPIRIN 81 MG PO CHEW
324.0000 mg | CHEWABLE_TABLET | Freq: Once | ORAL | Status: AC
  Administered 2012-02-07: 324 mg via ORAL
  Filled 2012-02-07: qty 4

## 2012-02-07 MED ORDER — NITROGLYCERIN 0.4 MG SL SUBL: 0.4000 mg | SUBLINGUAL_TABLET | SUBLINGUAL | Status: DC | PRN

## 2012-02-07 MED ORDER — CONJ ESTROG-MEDROXYPROGEST ACE 0.625-2.5 MG PO TABS: 1.0000 | ORAL_TABLET | Freq: Every day | ORAL | Status: DC

## 2012-02-07 MED ORDER — MECLIZINE HCL 25 MG PO TABS: 25.0000 mg | ORAL_TABLET | Freq: Three times a day (TID) | ORAL | Status: DC | PRN

## 2012-02-07 MED ORDER — ESTROGENS CONJUGATED 0.625 MG PO TABS: 0.6250 mg | ORAL_TABLET | Freq: Every day | ORAL | Status: DC

## 2012-02-07 MED ORDER — HYDROCHLOROTHIAZIDE 12.5 MG PO CAPS
12.5000 mg | ORAL_CAPSULE | Freq: Every day | ORAL | Status: DC
  Administered 2012-02-08: 12.5 mg via ORAL
  Filled 2012-02-07: qty 1

## 2012-02-07 MED ORDER — HEPARIN SODIUM (PORCINE) 5000 UNIT/ML IJ SOLN
5000.0000 [IU] | Freq: Three times a day (TID) | INTRAMUSCULAR | Status: DC
  Administered 2012-02-07 – 2012-02-08 (×2): 5000 [IU] via SUBCUTANEOUS
  Filled 2012-02-07 (×5): qty 1

## 2012-02-07 MED ORDER — MEDROXYPROGESTERONE ACETATE 2.5 MG PO TABS
2.5000 mg | ORAL_TABLET | Freq: Every day | ORAL | Status: DC
  Administered 2012-02-08: 2.5 mg via ORAL
  Filled 2012-02-07: qty 1

## 2012-02-07 MED ORDER — LEVOTHYROXINE SODIUM 112 MCG PO TABS
112.0000 ug | ORAL_TABLET | Freq: Every day | ORAL | Status: DC
  Administered 2012-02-08: 112 ug via ORAL
  Filled 2012-02-07 (×2): qty 1

## 2012-02-07 MED ORDER — ONDANSETRON HCL 4 MG/2ML IJ SOLN: 4.0000 mg | Freq: Four times a day (QID) | INTRAMUSCULAR | Status: DC | PRN

## 2012-02-07 MED ORDER — ALPRAZOLAM 0.5 MG PO TABS
0.5000 mg | ORAL_TABLET | Freq: Every evening | ORAL | Status: DC | PRN
Start: 2012-02-07 — End: 2012-02-08
  Administered 2012-02-07: 0.5 mg via ORAL
  Filled 2012-02-07: qty 1

## 2012-02-07 NOTE — ED Notes (Signed)
Pt states she was on the mower 3 weeks ago and got dizzy. Saw Dr.  Given meds for vertigo which helped nausea, but not dizziness. Yest dev chest tightness and sensation she was going to fall. Cardiac hx.

## 2012-02-07 NOTE — ED Provider Notes (Signed)
History   This chart was scribed for Rolan Bucco, MD by Charolett Bumpers . The patient was seen in room MH02/MH02.   CSN: [id number]  Arrival date & time 02/07/12  1339   First MD Initiated Contact with Patient 02/07/12 1504      Chief Complaint  Patient presents with  . Chest Pain    (Consider location/radiation/quality/duration/timing/severity/associated sxs/prior treatment) HPI HILDRETH ORSAK is a 71 y.o. female who presents to the Emergency Department complaining of intermittent, moderate chest tightness with associated dizziness, generalized weakness, nausea and numbness in her left hand that started yesterday evening. Numbness to hand was yesterday.  None now.  Now unilateral weakness. Patient states that the dizziness started 3 week ago. Patient states that she saw her PCP and given medications for vertigo with relief for her nausea but not for her dizziness. Patient states that she has been somewhat dizzy ever since. Yesterday, patient states that she had a episode of chest tightness and sensation like she was going to fall. Patient states that her chest tightness started after the dizziness. Patient denies any SOB, vomiting, diaphoresis, headaches, visual changes, or trouble swallowing. Patient reports associated nausea. Patient describes the dizziness as an unsteadiness but denies any sensation of the room spinning. Patient states that her symptoms are aggravated with walking and exertion. No worsening with movement of head. Patient states that symptoms are alleviated with laying down, sitting and rest. Patient reports associated left hand numbness last night. Patient also reports associated generalized weakness and states she has felt this way for the past 6 months. Patient denies any recent injuries. Patient states that her chest tightness has improved today. Patient reports a h/o a cardiac blockage and states that she had chest pain with the blockage. Patient states that today's  symptoms are not similar to when she had the blockage. Patient denies having any cardiac stents in place. Had angioplasty about 3 years ago.  No stress test or cath since then. PMD: [facility] Cardiologist: Dr. [place] at [facility]   Past Medical History  Diagnosis Date  . Colon polyps   . Allergy   . Arthritis     low back  . Cancer     thyroid, s/p thyroidectomy 8 years ago  . Depression   . GERD (gastroesophageal reflux disease)   . Hyperlipidemia   . Hypertension   . Ulcer   . Hypothyroidism 06/12/2011  . Hiatal hernia   . CAD (coronary artery disease) 06/12/2011  . Back pain 06/13/2011  . Vertigo 06/13/2011  . Leg fracture   . Chest pain   . Thyroid cancer     Past Surgical History  Procedure Date  . Thyroidectomy, partial     twice  . Fracture surgery     left leg, pin and 5 bolts in place in lower tibia  . Tubal ligation     growth removed from posterior vaginal vault  . Lesion removal     vaginal    Family History  Problem Relation Age of Onset  . Cancer Mother     ovary/uterus-can't remember  . Stroke Mother   . Heart disease Father   . Heart disease Sister     bypass  . Stroke Sister   . Cancer Sister     LN, bone  . Hypertension Sister   . Heart disease Brother     bypass  . Cancer Cousin     breast    History  Substance Use  Topics  . Smoking status: Never Smoker   . Smokeless tobacco: Never Used  . Alcohol Use: No    OB History    Grav Para Term Preterm Abortions TAB SAB Ect Mult Living                  Review of Systems  Constitutional: Negative for fever and diaphoresis.  HENT: Negative for congestion, sore throat, rhinorrhea and trouble swallowing.   Eyes: Negative for visual disturbance.  Respiratory: Positive for chest tightness. Negative for cough and shortness of breath.   Cardiovascular: Negative for chest pain and leg swelling.  Gastrointestinal: Positive for nausea. Negative for vomiting and abdominal pain.    Genitourinary: Negative for dysuria.  Skin: Negative for rash.  Neurological: Positive for dizziness, weakness and numbness. Negative for syncope and headaches.  All other systems reviewed and are negative.    Allergies  Amoxicillin  Home Medications   Current Outpatient Rx  Name Route Sig Dispense Refill  . ALPRAZOLAM 0.5 MG PO TABS Oral Take 0.5 mg by mouth at bedtime as needed.    . ASPIRIN 325 MG PO TABS Oral Take 325 mg by mouth daily. Patient used this medication for dizziness.    Marland Kitchen CONJ ESTROG-MEDROXYPROGEST ACE 0.625-2.5 MG PO TABS  Pt only takes once weekly    . LEVOTHYROXINE SODIUM 112 MCG PO TABS Oral Take 1 tablet (112 mcg total) by mouth daily. 30 tablet 5  . LOSARTAN POTASSIUM-HCTZ 100-12.5 MG PO TABS  TAKE ONE TABLET BY MOUTH DAILY 90 tablet 0    **Patient requests 90 day supply** patient needs t ...  . MECLIZINE HCL 25 MG PO TABS Oral Take 1 tablet (25 mg total) by mouth 3 (three) times daily as needed. 30 tablet 1  . ALPRAZOLAM 0.5 MG PO TABS Oral Take 0.5 tablets (0.25 mg total) by mouth at bedtime as needed for sleep or anxiety. 15 tablet 2  . CEFDINIR 300 MG PO CAPS Oral Take 1 capsule (300 mg total) by mouth 2 (two) times daily. X 7 days 14 capsule 0    BP 178/87  Pulse 84  Temp 98.5 F (36.9 C) (Oral)  Resp 20  Ht 5\' 2"  (1.575 m)  Wt 159 lb (72.122 kg)  BMI 29.08 kg/m2  SpO2 100%  Physical Exam  Nursing note and vitals reviewed. Constitutional: She is oriented to person, place, and time. She appears well-developed and well-nourished. No distress.  HENT:  Head: Normocephalic and atraumatic.  Mouth/Throat: Oropharynx is clear and moist.  Eyes: Conjunctivae and EOM are normal. Pupils are equal, round, and reactive to light.       No nystagmus.   Neck: Normal range of motion. Neck supple. No tracheal deviation present.  Cardiovascular: Normal rate, regular rhythm and normal heart sounds.   Pulmonary/Chest: Effort normal and breath sounds normal. No  respiratory distress. She has no wheezes.  Abdominal: Soft. Bowel sounds are normal. She exhibits no distension. There is no tenderness.  Musculoskeletal: Normal range of motion. She exhibits no edema and no tenderness.  Neurological: She is alert and oriented to person, place, and time. She has normal strength. No cranial nerve deficit or sensory deficit. Coordination normal. GCS eye subscore is 4. GCS verbal subscore is 5. GCS motor subscore is 6.       Cranial nerves intact. Finger to nose test normal. Neurologically intact.   Skin: Skin is warm and dry.  Psychiatric: She has a normal mood and affect. Her behavior is  normal.    ED Course  Procedures (including critical care time)  DIAGNOSTIC STUDIES: Oxygen Saturation is 100% on room air, normal by my interpretation.    COORDINATION OF CARE:  1525: Discussed planned course of treatment with the patient, who is agreeable at this time.   Results for orders placed during the hospital encounter of 02/07/12  CBC      Component Value Range   WBC 7.4  4.0 - 10.5 K/uL   RBC 3.98  3.87 - 5.11 MIL/uL   Hemoglobin 12.5  12.0 - 15.0 g/dL   HCT 16.1 (*) 09.6 - 04.5 %   MCV 89.2  78.0 - 100.0 fL   MCH 31.4  26.0 - 34.0 pg   MCHC 35.2  30.0 - 36.0 g/dL   RDW 40.9  81.1 - 91.4 %   Platelets 230  150 - 400 K/uL  DIFFERENTIAL      Component Value Range   Neutrophils Relative 61  43 - 77 %   Neutro Abs 4.5  1.7 - 7.7 K/uL   Lymphocytes Relative 30  12 - 46 %   Lymphs Abs 2.2  0.7 - 4.0 K/uL   Monocytes Relative 7  3 - 12 %   Monocytes Absolute 0.5  0.1 - 1.0 K/uL   Eosinophils Relative 2  0 - 5 %   Eosinophils Absolute 0.2  0.0 - 0.7 K/uL   Basophils Relative 0  0 - 1 %   Basophils Absolute 0.0  0.0 - 0.1 K/uL  BASIC METABOLIC PANEL      Component Value Range   Sodium 139  135 - 145 mEq/L   Potassium 4.1  3.5 - 5.1 mEq/L   Chloride 101  96 - 112 mEq/L   CO2 26  19 - 32 mEq/L   Glucose, Bld 119 (*) 70 - 99 mg/dL   BUN 16  6 - 23  mg/dL   Creatinine, Ser 7.82  0.50 - 1.10 mg/dL   Calcium 9.8  8.4 - 95.6 mg/dL   GFR calc non Af Amer 55 (*) >90 mL/min   GFR calc Af Amer 64 (*) >90 mL/min  TROPONIN I      Component Value Range   Troponin I <0.30  <0.30 ng/mL  URINALYSIS, ROUTINE W REFLEX MICROSCOPIC      Component Value Range   Color, Urine YELLOW  YELLOW   APPearance CLEAR  CLEAR   Specific Gravity, Urine 1.012  1.005 - 1.030   pH 6.0  5.0 - 8.0   Glucose, UA NEGATIVE  NEGATIVE mg/dL   Hgb urine dipstick NEGATIVE  NEGATIVE   Bilirubin Urine NEGATIVE  NEGATIVE   Ketones, ur NEGATIVE  NEGATIVE mg/dL   Protein, ur NEGATIVE  NEGATIVE mg/dL   Urobilinogen, UA 0.2  0.0 - 1.0 mg/dL   Nitrite NEGATIVE  NEGATIVE   Leukocytes, UA NEGATIVE  NEGATIVE     Dg Chest 2 View  02/07/2012  *RADIOLOGY REPORT*  Clinical Data: Dizziness  CHEST - 2 VIEW  Comparison: 07/15/2008  Findings: Normal heart size.  Increased AP diameter of the chest and bronchitic changes are likely related to COPD.  Scarring in both lung apices is stable.  No new consolidation, pleural effusion or pneumothorax.  No acute bony deformity.  IMPRESSION: No active cardiopulmonary disease.  Chronic changes.  Original Report Authenticated By: Donavan Burnet, M.D.   Ct Head Wo Contrast  02/07/2012  *RADIOLOGY REPORT*  Clinical Data: 71 year old female with dizziness, weakness and left  hand numbness.  CT HEAD WITHOUT CONTRAST  Technique:  Contiguous axial images were obtained from the base of the skull through the vertex without contrast.  Comparison: 06/22/2011  Findings: Moderate atrophy and moderate to severe chronic small vessel white matter ischemic changes are again noted. 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  No acute intracranial abnormalities are identified, including mass lesion or mass effect, hydrocephalus, extra-axial fluid collection, midline shift, hemorrhage, or acute infarction.  The visualized bony calvarium is unremarkable. Mucosal  thickening within the right maxillary sinus again noted.  IMPRESSION: No evidence of acute intracranial abnormality.  Atrophy, chronic small vessel white matter ischemic changes and remote basal ganglia infarcts.  Original Report Authenticated By: Rosendo Gros, M.D.    Date: 02/07/2012  Rate: 89  Rhythm: normal sinus rhythm  QRS Axis: normal  Intervals: normal  ST/T Wave abnormalities: normal  Conduction Disutrbances:none  Narrative Interpretation:   Old EKG Reviewed: unchanged    1. Chest pain       MDM  Pt with intermittent chest tightness, dizziness, nausea.  Worse on exertion.  No other neuro symptoms to suggest CVA.  Spoke with Dr. Donnie Aho, with [facility] who has accepted pt for transfer to [facility].  I personally performed the services described in this documentation, which was scribed in my presence.  The recorded information has been reviewed and considered.        Rolan Bucco, MD 02/07/12 [phone]

## 2012-02-07 NOTE — H&P (Signed)
History and Physical   Admit date: 02/07/2012 Name:  Colleen Wright Medical record number: [id number] DOB/Age:  March 12, 1941  71 y.o.  Referring Physician:  [facility] Primary Cardiologist:  Dr. Cassell Clement Primary Physician:  Dr. Darrow Bussing  Chief complaint/reason for admission: Chest pain, dizziness  HPI:  This 71 year old female has a history of coronary artery disease. She had PTCA of the posterior lateral branch in 2009 by Dr. [place] when she had some chest discomfort however she had a negative Cardiolite prior to that. She has done well since then but a couple of weeks ago she developed dizziness and some nausea. She later went to see her she saw her primary care physician and was treated with meclizine. She is continued to have some episodic dizziness. Yesterday she was working out in the yard and had intense dizziness and had a near syncopal episode where she was called by a relative. This morning she awakened and had some vague substernal chest pressure and tightness. She was able to go to church but the pressure and tightness persisted and her family her sugar go to the emergency room. She is not currently having chest tightness. She normally denies exertional chest pain although she does have some intermittent chest pain associated with the dizziness and nausea over the past few weeks. She is not currently taking lipid-lowering therapy. She is in the process of changing cardiologists and says that she has an appointment to see Dr. Eden Emms on July 8. She denies PND, orthopnea or edema.    Past Medical History  Diagnosis Date  . Colon polyps   . Allergy   . Arthritis     low back  . Cancer     thyroid, s/p thyroidectomy 8 years ago  . Depression   . GERD (gastroesophageal reflux disease)   . Hyperlipidemia   . Hypertension   . Ulcer   . Hypothyroidism 06/12/2011  . Hiatal hernia   . CAD (coronary artery disease) 06/12/2011  . Back pain 06/13/2011  . Vertigo  06/13/2011  . Leg fracture   . Thyroid cancer      Past Surgical History  Procedure Date  . Thyroidectomy, partial     twice  . Fracture surgery     left leg, pin and 5 bolts in place in lower tibia  . Tubal ligation     growth removed from posterior vaginal vault  . Lesion removal     vaginal   Allergies: is allergic to amoxicillin.   Medications: Prior to Admission medications   Medication Sig Start Date End Date Taking? Authorizing Provider  ALPRAZolam Prudy Feeler) 0.5 MG tablet Take 0.5 mg by mouth at bedtime as needed.   Yes Historical Provider, MD  aspirin 325 MG tablet Take 325 mg by mouth daily. Patient used this medication for dizziness.   Yes Historical Provider, MD  estrogen, conjugated,-medroxyprogesterone (PREMPRO) 0.625-2.5 MG per tablet Pt only takes once weekly 01/25/12  Yes Bradd Canary, MD  levothyroxine (SYNTHROID, LEVOTHROID) 112 MCG tablet Take 1 tablet (112 mcg total) by mouth daily. 02/02/12  Yes Bradd Canary, MD  losartan-hydrochlorothiazide [facility]) 100-12.5 MG per tablet TAKE ONE TABLET BY MOUTH DAILY 07/20/11  Yes Cassell Clement, MD  meclizine (ANTIVERT) 25 MG tablet Take 1 tablet (25 mg total) by mouth 3 (three) times daily as needed. 01/25/12  Yes Bradd Canary, MD  ALPRAZolam Prudy Feeler) 0.5 MG tablet Take 0.5 tablets (0.25 mg total) by mouth at bedtime as needed for  sleep or anxiety. 02/02/12   Bradd Canary, MD  cefdinir (OMNICEF) 300 MG capsule Take 1 capsule (300 mg total) by mouth 2 (two) times daily. X 7 days 01/26/12   Bradd Canary, MD    Family History:  Family Status  Relation Status Death Age  . Mother Deceased 43    stroke  . Father Deceased 78    heart  . Sister Alive     28  . Brother Deceased 80    fell with head injury with bleeding  . Son Alive     45  . Maternal Grandmother Deceased 18s  . Maternal Grandfather Deceased     young, unknown  . Paternal Grandmother Deceased 48  . Paternal Grandfather Deceased 44  . Son Alive      58    Social History:   reports that she has never smoked. She has never used smokeless tobacco. She reports that she does not drink alcohol or use illicit drugs.   History   Social History Narrative  . No narrative on file    Review of Systems: Recent vertigo and dizziness, occasional headaches, occasional arthritis, mild dyspnea with exertion, she has had some urinary tract infections previously. She has had some difficulty sleeping and intermittently takes Xanax to sleep. She is significant low back pain. She is complained of excessive fatigue recently. Other than as noted above, the remainder of the review of systems is normal  Physical Exam: BP 167/94  Pulse 62  Temp 98.2 F (36.8 C) (Oral)  Resp 19  Ht 5\' 2"  (1.575 m)  Wt 71.351 kg (157 lb 4.8 oz)  BMI 28.77 kg/m2  SpO2 100%  General appearance: alert, appears stated age and no distress Head: Normocephalic, without obvious abnormality, atraumatic Eyes: conjunctivae/corneas clear. PERRL, EOM's intact. Fundi benign. Throat: lips, mucosa, and tongue normal; teeth and gums normal Neck: no adenopathy, no carotid bruit, no JVD and supple, symmetrical, trachea midline Lungs: clear to auscultation bilaterally Heart: regular rate and rhythm, S1, S2 normal, no murmur, click, rub or gallop Abdomen: soft, non-tender; bowel sounds normal; no masses,  no organomegaly Pelvic: deferred Extremities: extremities normal, atraumatic, no cyanosis or edema Pulses: 2+ and symmetric Skin: Skin color, texture, turgor normal. No rashes or lesions Neurologic: Grossly normal  Labs: CBC    Component Value Date/Time   WBC 7.4 02/07/2012 1421   WBC 6.9 09/19/2007 1251   RBC 3.98 02/07/2012 1421   RBC 4.41 09/19/2007 1251   HGB 12.5 02/07/2012 1421   HGB 13.8 09/19/2007 1251   HCT 35.5* 02/07/2012 1421   HCT 38.9 09/19/2007 1251   PLT 230 02/07/2012 1421   PLT 256 09/19/2007 1251   MCV 89.2 02/07/2012 1421   MCV 88.2 09/19/2007 1251   MCH 31.4 02/07/2012  1421   MCH 31.2 09/19/2007 1251   MCHC 35.2 02/07/2012 1421   MCHC 35.3 09/19/2007 1251   RDW 13.1 02/07/2012 1421   RDW 12.8 09/19/2007 1251   LYMPHSABS 2.2 02/07/2012 1421   LYMPHSABS 2.6 09/19/2007 1251   MONOABS 0.5 02/07/2012 1421   MONOABS 0.5 09/19/2007 1251   EOSABS 0.2 02/07/2012 1421   EOSABS 0.2 09/19/2007 1251   BASOSABS 0.0 02/07/2012 1421   BASOSABS 0.0 09/19/2007 1251   CMP     Component Value Date/Time   NA 139 02/07/2012 1421   K 4.1 02/07/2012 1421   CL 101 02/07/2012 1421   CO2 26 02/07/2012 1421   GLUCOSE 119* 02/07/2012 1421  BUN 16 02/07/2012 1421   CREATININE 1.00 02/07/2012 1421   CALCIUM 9.8 02/07/2012 1421   PROT 7.1 01/25/2012 1223   ALBUMIN 3.9 01/25/2012 1223   ALBUMIN 3.9 01/25/2012 1223   AST 32 01/25/2012 1223   ALT 22 01/25/2012 1223   ALKPHOS 77 01/25/2012 1223   BILITOT 0.4 01/25/2012 1223   GFRNONAA 55* 02/07/2012 1421   GFRAA 64* 02/07/2012 1421   BNP (last 3 results) No results found for this basename: PROBNP:3 in the last 8760 hours Cardiac Panel (last 3 results)  Basename 02/07/12 1421  CKTOTAL --  CKMB --  TROPONINI <0.30  RELINDX --   Thyroid  Lab Results  Component Value Date   TSH 2.81 01/25/2012    EKG: Sinus rhythm, no acute ST changes  Radiology: Changes consistent with COPD, chronic changes   IMPRESSIONS: 1. Chest discomfort with atypical features in a patient with known coronary artery disease 2. Coronary artery disease with previous PTCA of the posterior lateral branch 3. Recent episodes of dizziness and near syncope possible vertigo 4. Hyperlipidemia not currently treated 5. Hypertension controlled  PLAN: Observation status. She will have serial cardiac enzymes. Her pain was somewhat atypical. Keep n.p.o. after midnight for further testing. Begin beta blockers. DVT prophylaxis. Further workup per [facility] cardiology.  Signed: Darden Palmer MD [facility]  02/07/2012, 10:12 PM

## 2012-02-08 DIAGNOSIS — R079 Chest pain, unspecified: Secondary | ICD-10-CM | POA: Diagnosis not present

## 2012-02-08 DIAGNOSIS — I10 Essential (primary) hypertension: Secondary | ICD-10-CM | POA: Diagnosis not present

## 2012-02-08 DIAGNOSIS — E785 Hyperlipidemia, unspecified: Secondary | ICD-10-CM

## 2012-02-08 DIAGNOSIS — I2510 Atherosclerotic heart disease of native coronary artery without angina pectoris: Secondary | ICD-10-CM | POA: Diagnosis not present

## 2012-02-08 LAB — COMPREHENSIVE METABOLIC PANEL
ALT: 12 U/L (ref 0–35)
Albumin: 3.7 g/dL (ref 3.5–5.2)
Alkaline Phosphatase: 74 U/L (ref 39–117)
BUN: 18 mg/dL (ref 6–23)
Chloride: 102 mEq/L (ref 96–112)
Glucose, Bld: 96 mg/dL (ref 70–99)
Potassium: 3.5 mEq/L (ref 3.5–5.1)
Total Bilirubin: 0.2 mg/dL — ABNORMAL LOW (ref 0.3–1.2)

## 2012-02-08 LAB — CARDIAC PANEL(CRET KIN+CKTOT+MB+TROPI)
Relative Index: INVALID (ref 0.0–2.5)
Relative Index: INVALID (ref 0.0–2.5)
Total CK: 40 U/L (ref 7–177)
Troponin I: <0.3 ng/mL (ref <0.30)

## 2012-02-08 LAB — LIPID PANEL
LDL Cholesterol: 127 mg/dL — ABNORMAL HIGH (ref 0–99)
Total CHOL/HDL Ratio: 4.8 RATIO

## 2012-02-08 LAB — PRO B NATRIURETIC PEPTIDE: Pro B Natriuretic peptide (BNP): 133.3 pg/mL — ABNORMAL HIGH (ref 0–125)

## 2012-02-08 LAB — APTT: aPTT: 32 seconds (ref 24–37)

## 2012-02-08 MED ORDER — ATORVASTATIN CALCIUM 10 MG PO TABS: 10.0000 mg | ORAL_TABLET | Freq: Every day | ORAL | Status: DC

## 2012-02-08 MED ORDER — NITROGLYCERIN 0.4 MG SL SUBL: 0.4000 mg | SUBLINGUAL_TABLET | SUBLINGUAL | Status: DC | PRN

## 2012-02-08 MED ORDER — ATORVASTATIN CALCIUM 10 MG PO TABS
10.0000 mg | ORAL_TABLET | Freq: Every day | ORAL | Status: DC
  Filled 2012-02-08: qty 1

## 2012-02-08 NOTE — Discharge Instructions (Signed)
PLEASE REMEMBER TO BRING ALL OF YOUR MEDICATIONS TO EACH OF YOUR FOLLOW-UP OFFICE VISITS.  PLEASE TAKE ALL NEW MEDICATIONS/MEDICATION CHANGES AS PRESCRIBED.   PLEASE DRINK PLENTY OF FLUIDS.   PLEASE FOLLOW-UP WITH A NEUROLOGIST.   Cholesterol Cholesterol is a white, waxy, fat-like protein needed by your body in small amounts. The liver makes all the cholesterol you need. It is carried from the liver by the blood through the blood vessels. Deposits (plaque) may build up on blood vessel walls. This makes the arteries narrower and stiffer. Plaque increases the risk for heart attack and stroke. You cannot feel your cholesterol level even if it is very high. The only way to know is by a blood test to check your lipid (fats) levels. Once you know your cholesterol levels, you should keep a record of the test results. Work with your caregiver to to keep your levels in the desired range. WHAT THE RESULTS MEAN:  Total cholesterol is a rough measure of all the cholesterol in your blood.   LDL is the so-called bad cholesterol. This is the type that deposits cholesterol in the walls of the arteries. You want this level to be low.   HDL is the good cholesterol because it cleans the arteries and carries the LDL away. You want this level to be high.   Triglycerides are fat that the body can either burn for energy or store. High levels are closely linked to heart disease.  DESIRED LEVELS:  Total cholesterol below 200.   LDL below 100 for people at risk, below 70 for very high risk.   HDL above 50 is good, above 60 is best.   Triglycerides below 150.  HOW TO LOWER YOUR CHOLESTEROL:  Diet.   Choose fish or white meat chicken and [place], roasted or baked. Limit fatty cuts of red meat, fried foods, and processed meats, such as sausage and lunch meat.   Eat lots of fresh fruits and vegetables. Choose whole grains, beans, pasta, potatoes and cereals.   Use only small amounts of olive, corn or canola  oils. Avoid butter, mayonnaise, shortening or palm kernel oils. Avoid foods with trans-fats.   Use skim/nonfat milk and low-fat/nonfat yogurt and cheeses. Avoid whole milk, cream, ice cream, egg yolks and cheeses. Healthy desserts include angel food cake, ginger snaps, animal crackers, hard candy, popsicles, and low-fat/nonfat frozen yogurt. Avoid pastries, cakes, pies and cookies.   Exercise.   A regular program helps decrease LDL and raises HDL.   Helps with weight control.   Do things that increase your activity level like gardening, walking, or taking the stairs.   Medication.   May be prescribed by your caregiver to help lowering cholesterol and the risk for heart disease.   You may need medicine even if your levels are normal if you have several risk factors.  HOME CARE INSTRUCTIONS   Follow your diet and exercise programs as suggested by your caregiver.   Take medications as directed.   Have blood work done when your caregiver feels it is necessary.  MAKE SURE YOU:   Understand these instructions.   Will watch your condition.   Will get help right away if you are not doing well or get worse.  Document Released: 04/28/2001 Document Revised: 07/23/2011 Document Reviewed: 10/19/2007 [facility] Patient Information 2012 [place], [place].

## 2012-02-08 NOTE — Progress Notes (Signed)
Utilization review complete 

## 2012-02-08 NOTE — Discharge Summary (Signed)
Discharge Summary   Patient ID: Colleen Wright,  MRN: [id number], DOB/AGE: [birth year] 71 y.o.  Admit date: 02/07/2012 Discharge date: 02/08/2012  Discharge Diagnoses Principal Problem:  *Chest pain  - EKG (on admission and serial tracings) without evidence of ischemia  - Cardiac biomarker trend:    02/07/2012 14:21 02/07/2012 22:31 02/08/2012 06:40  CK, MB  1.6 1.4  CK Total  44 40  Troponin I <0.30 <0.30 <0.30    - pBNP 133.3, CXR unremarkable as outlined below  - Ruled out for ACS, chest pain improved  - Continue with follow-up with Dr. Eden Emms on 02/16/12 as listed below  Active Problems:  Hyperlipidemia  - Lipid panel this AM   02/08/2012 06:40  Cholesterol 206 (H)  Triglycerides 182 (H)  HDL 43  LDL (calc) 127 (H)  VLDL 36  Total CHOL/HDL Ratio 4.8     - Low-dose Lipitor initiated   Hypothyroidism  - Stable, Synthroid continued  CAD (coronary artery disease)  - S/p PTCA posterolateral branch in 2009  - Ruled out for ACS this admission  - Discharged with NTG SL PRN  HTN (hypertension)  - Stable, continued HCTZ/ARB combo  - To be reassessed on follow-up  Lightheadedness  - On admission  - Negative orthostatic vitals signs  - Noncontrast head CT with chronic infarctions and cerebral atrophy; no acute infarct/CVA  - Advised to follow-up with neuro outpatient in 1-2 weeks   Allergies Allergies  Allergen Reactions  . Amoxicillin Other (See Comments)    Patient develops a very bad yeast infection.    Diagnostic Studies/Procedures  CHEST X-RAY - 02/07/12   Comparison: 07/15/2008  Findings: Normal heart size. Increased AP diameter of the chest  and bronchitic changes are likely related to COPD. Scarring in  both lung apices is stable. No new consolidation, pleural effusion  or pneumothorax. No acute bony deformity.  IMPRESSION:  No active cardiopulmonary disease. Chronic changes.  CT HEAD WITHOUT CONTRAST- 02/07/12  Technique: Contiguous axial images were  obtained from the base of  the skull through the vertex without contrast.  Comparison: 06/22/2011  Findings: Moderate atrophy and moderate to severe chronic small  vessel white matter ischemic changes are again noted.  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  No acute intracranial abnormalities are identified, including mass  lesion or mass effect, hydrocephalus, extra-axial fluid collection,  midline shift, hemorrhage, or acute infarction.  The visualized bony calvarium is unremarkable. Mucosal thickening  within the right maxillary sinus again noted.  IMPRESSION:  No evidence of acute intracranial abnormality.  Atrophy, chronic small vessel white matter ischemic changes and  remote basal ganglia infarcts.  History of Present Illness  Colleen Wright is a 71yo female with the above problem list who was admitted to [facility] on 02/07/12 for chest pain.   She reported experiencing dizziness and some nausea. She was started on meclizine by her PCP. Her dizziness/lightheadedness persisted. The day prior to admission, she had a more severe episode of lightheadedness and near syncope. The morning of admission, she endorsed vague substernal chest pressure, non-exertional, and not reminiscent of her prior angina.   Upon ED arrival, EKG was nonacute for ischemia. Cardiac biomarkers were WNL. CBC and BMET were unremarkable. CXR as above was unremarkable. Noncontrast head CT as above revealed no acute process. The chest pain was evaluated as atypical for a cardiac etiology, however given the patient's cardiac risk factors, she was subsequently admitted for ACS rule out.   Hospital Course  She was continued on her outpatient medications. Lopressor and NTG SL PRN were added. There were no events overnight and her symptoms improved. EKG this AM revealed no evidence of ischemia. Cardiac biomarkers returned within normal limits x 3. Lipid panel was ordered with the above findings.  Orthostatic vital signs were recorded, and found to be negative.   Today, she was assessed by Dr. Tenny Craw and found to be stable for discharge. She will be started low-dose atrovastatin. She was advised to call the office or inform Dr. Eden Emms if she did not tolerate this medication. Additionally, she was encouraged to follow-up with a neurologist regarding her complaints of lightheadedness (she also described her symptoms as "leaning to one side). She was encouraged to drink plenty of fluids (despite being normotensive without orthostatic changes this admission). She will attend her previously scheduled follow-up appointment with Dr. Eden Emms as outlined below. Lopressor will be discontinued. NTG SL PRN will be added. This information, including supplemental cholesterol material has been clearly outlined in the discharge AVS.   Discharge Vitals:  Blood pressure 144/71, pulse 61, temperature 97.4 F (36.3 C), temperature source Oral, resp. rate 20, height 5\' 2"  (1.575 m), weight 71.351 kg (157 lb 4.8 oz), SpO2 100.00%.   Labs: Recent Labs  [facility] 02/07/12 2232 02/07/12 1421   WBC 6.7 7.4   HGB 12.0 12.5   HCT 35.6* 35.5*   MCV 89.0 89.2   PLT 205 230   Lab 02/07/12 2232 02/07/12 1421  NA 139 139  K 3.5 4.1  CL 102 101  CO2 26 26  BUN 18 16  CREATININE 1.06 1.00  CALCIUM 9.4 9.8  PROT 6.9 --  BILITOT 0.2* --  ALKPHOS 74 --  ALT 12 --  AST 16 --  AMYLASE -- --  LIPASE -- --  GLUCOSE 96 119*   Recent Labs  Basename 02/08/12 0640   CHOL 206*   HDL 43   LDLCALC 127*   TRIG 182*   CHOLHDL 4.8   LDLDIRECT --   Disposition:  Discharge Orders    Future Appointments: Provider: Department: Dept Phone: Center:   02/16/2012 4:00 PM Wendall Stade, MD [facility] [place] [phone] [facility]   05/23/2012 9:30 AM Bradd Canary, MD [facility] [phone] None     Follow-up Information    Follow up with Charlton Haws, MD on 02/16/2012. (At 2:00 PM for follow-up as previously  scheduled. )    Contact information:   [address], [address] [place] [address] [phone]       Schedule an appointment as soon as possible for a visit in 1 week to follow up. (Please make an appoint to follow-up with a neurologist regarding your dizziness/imbalance. )          Discharge Medications:  Medication List  As of 02/08/2012 11:03 AM   START taking these medications         atorvastatin 10 MG tablet   Commonly known as: LIPITOR   Take 1 tablet (10 mg total) by mouth daily at 6 PM.      nitroGLYCERIN 0.4 MG SL tablet   Commonly known as: NITROSTAT   Place 1 tablet (0.4 mg total) under the tongue every 5 (five) minutes x 3 doses as needed for chest pain.         CONTINUE taking these medications         * ALPRAZolam 0.5 MG tablet   Commonly known as: Prudy Feeler  Take 0.5 tablets (0.25 mg total) by mouth at bedtime as needed for sleep or anxiety.      * ALPRAZolam 0.5 MG tablet   Commonly known as: XANAX      aspirin 325 MG tablet      cefdinir 300 MG capsule   Commonly known as: OMNICEF   Take 1 capsule (300 mg total) by mouth 2 (two) times daily. X 7 days      estrogen (conjugated)-medroxyprogesterone 0.625-2.5 MG per tablet   Commonly known as: PREMPRO   Pt only takes once weekly      levothyroxine 112 MCG tablet   Commonly known as: SYNTHROID, LEVOTHROID   Take 1 tablet (112 mcg total) by mouth daily.      losartan-hydrochlorothiazide 100-12.5 MG per tablet   Commonly known as: HYZAAR   TAKE ONE TABLET BY MOUTH DAILY      meclizine 25 MG tablet   Commonly known as: ANTIVERT   Take 1 tablet (25 mg total) by mouth 3 (three) times daily as needed.     * Notice: This list has 2 medication(s) that are the same as other medications prescribed for you. Read the directions carefully, and ask your doctor or other care provider to review them with you.        Where to get your medications    These are the  prescriptions that you need to pick up. We sent them to a specific pharmacy, so you will need to go there to get them.   [id number] - [place], [place] - [address]    [address] [place] [address]    Phone: [phone]        atorvastatin 10 MG tablet   nitroGLYCERIN 0.4 MG SL tablet           Outstanding Labs/Studies: None  Duration of Discharge Encounter: Greater than 30 minutes including physician time.  Signed, R. Hurman Horn, PA-C 02/08/2012, 11:03 AM

## 2012-02-08 NOTE — Progress Notes (Signed)
Subjective: No severe dizziness.  No CP.  Denies reflux. Objective: Filed Vitals:   02/07/12 2115 02/07/12 2125 02/08/12 0455 02/08/12 0827  BP:  167/94 122/60 119/71  Pulse:  62 64 59  Temp:  98.2 F (36.8 C) 98.4 F (36.9 C) 97.4 F (36.3 C)  TempSrc:  Oral Oral Oral  Resp:  19 20 18   Height:      Weight: 157 lb 4.8 oz (71.351 kg)     SpO2:  100% 100% 100%   Weight change:   Intake/Output Summary (Last 24 hours) at 02/08/12 0849 Last data filed at 02/08/12 0802  Gross per 24 hour  Intake      0 ml  Output    750 ml  Net   -750 ml    General: Alert, awake, oriented x3, in no acute distress Neck:  JVP is normal Heart: Regular rate and rhythm, without murmurs, rubs, gallops.  Lungs: Clear to auscultation.  No rales or wheezes. Exemities:  No edema.   Neuro: Grossly intact, nonfocal.   Lab Results: Results for orders placed during the hospital encounter of 02/07/12 (from the past 24 hour(s))  CBC     Status: Abnormal   Collection Time   02/07/12  2:21 PM      Component Value Range   WBC 7.4  4.0 - 10.5 K/uL   RBC 3.98  3.87 - 5.11 MIL/uL   Hemoglobin 12.5  12.0 - 15.0 g/dL   HCT 16.1 (*) 09.6 - 04.5 %   MCV 89.2  78.0 - 100.0 fL   MCH 31.4  26.0 - 34.0 pg   MCHC 35.2  30.0 - 36.0 g/dL   RDW 40.9  81.1 - 91.4 %   Platelets 230  150 - 400 K/uL  DIFFERENTIAL     Status: Normal   Collection Time   02/07/12  2:21 PM      Component Value Range   Neutrophils Relative 61  43 - 77 %   Neutro Abs 4.5  1.7 - 7.7 K/uL   Lymphocytes Relative 30  12 - 46 %   Lymphs Abs 2.2  0.7 - 4.0 K/uL   Monocytes Relative 7  3 - 12 %   Monocytes Absolute 0.5  0.1 - 1.0 K/uL   Eosinophils Relative 2  0 - 5 %   Eosinophils Absolute 0.2  0.0 - 0.7 K/uL   Basophils Relative 0  0 - 1 %   Basophils Absolute 0.0  0.0 - 0.1 K/uL  BASIC METABOLIC PANEL     Status: Abnormal   Collection Time   02/07/12  2:21 PM      Component Value Range   Sodium 139  135 - 145 mEq/L   Potassium 4.1  3.5 -  5.1 mEq/L   Chloride 101  96 - 112 mEq/L   CO2 26  19 - 32 mEq/L   Glucose, Bld 119 (*) 70 - 99 mg/dL   BUN 16  6 - 23 mg/dL   Creatinine, Ser 7.82  0.50 - 1.10 mg/dL   Calcium 9.8  8.4 - 95.6 mg/dL   GFR calc non Af Amer 55 (*) >90 mL/min   GFR calc Af Amer 64 (*) >90 mL/min  TROPONIN I     Status: Normal   Collection Time   02/07/12  2:21 PM      Component Value Range   Troponin I <0.30  <0.30 ng/mL  URINALYSIS, ROUTINE W REFLEX MICROSCOPIC     Status:  Normal   Collection Time   02/07/12  4:20 PM      Component Value Range   Color, Urine YELLOW  YELLOW   APPearance CLEAR  CLEAR   Specific Gravity, Urine 1.012  1.005 - 1.030   pH 6.0  5.0 - 8.0   Glucose, UA NEGATIVE  NEGATIVE mg/dL   Hgb urine dipstick NEGATIVE  NEGATIVE   Bilirubin Urine NEGATIVE  NEGATIVE   Ketones, ur NEGATIVE  NEGATIVE mg/dL   Protein, ur NEGATIVE  NEGATIVE mg/dL   Urobilinogen, UA 0.2  0.0 - 1.0 mg/dL   Nitrite NEGATIVE  NEGATIVE   Leukocytes, UA NEGATIVE  NEGATIVE  CARDIAC PANEL(CRET KIN+CKTOT+MB+TROPI)     Status: Normal   Collection Time   02/07/12 10:31 PM      Component Value Range   Total CK 44  7 - 177 U/L   CK, MB 1.6  0.3 - 4.0 ng/mL   Troponin I <0.30  <0.30 ng/mL   Relative Index RELATIVE INDEX IS INVALID  0.0 - 2.5  PRO B NATRIURETIC PEPTIDE     Status: Abnormal   Collection Time   02/07/12 10:31 PM      Component Value Range   Pro B Natriuretic peptide (BNP) 133.3 (*) 0 - 125 pg/mL  PROTIME-INR     Status: Normal   Collection Time   02/07/12 10:32 PM      Component Value Range   Prothrombin Time 13.5  11.6 - 15.2 seconds   INR 1.01  0.00 - 1.49  APTT     Status: Normal   Collection Time   02/07/12 10:32 PM      Component Value Range   aPTT 32  24 - 37 seconds  CBC     Status: Abnormal   Collection Time   02/07/12 10:32 PM      Component Value Range   WBC 6.7  4.0 - 10.5 K/uL   RBC 4.00  3.87 - 5.11 MIL/uL   Hemoglobin 12.0  12.0 - 15.0 g/dL   HCT 16.1 (*) 09.6 - 04.5 %   MCV  89.0  78.0 - 100.0 fL   MCH 30.0  26.0 - 34.0 pg   MCHC 33.7  30.0 - 36.0 g/dL   RDW 40.9  81.1 - 91.4 %   Platelets 205  150 - 400 K/uL  DIFFERENTIAL     Status: Normal   Collection Time   02/07/12 10:32 PM      Component Value Range   Neutrophils Relative 50  43 - 77 %   Neutro Abs 3.4  1.7 - 7.7 K/uL   Lymphocytes Relative 39  12 - 46 %   Lymphs Abs 2.6  0.7 - 4.0 K/uL   Monocytes Relative 7  3 - 12 %   Monocytes Absolute 0.5  0.1 - 1.0 K/uL   Eosinophils Relative 3  0 - 5 %   Eosinophils Absolute 0.2  0.0 - 0.7 K/uL   Basophils Relative 0  0 - 1 %   Basophils Absolute 0.0  0.0 - 0.1 K/uL  COMPREHENSIVE METABOLIC PANEL     Status: Abnormal   Collection Time   02/07/12 10:32 PM      Component Value Range   Sodium 139  135 - 145 mEq/L   Potassium 3.5  3.5 - 5.1 mEq/L   Chloride 102  96 - 112 mEq/L   CO2 26  19 - 32 mEq/L   Glucose, Bld 96  70 -  99 mg/dL   BUN 18  6 - 23 mg/dL   Creatinine, Ser 8.65  0.50 - 1.10 mg/dL   Calcium 9.4  8.4 - 78.4 mg/dL   Total Protein 6.9  6.0 - 8.3 g/dL   Albumin 3.7  3.5 - 5.2 g/dL   AST 16  0 - 37 U/L   ALT 12  0 - 35 U/L   Alkaline Phosphatase 74  39 - 117 U/L   Total Bilirubin 0.2 (*) 0.3 - 1.2 mg/dL   GFR calc non Af Amer 51 (*) >90 mL/min   GFR calc Af Amer 59 (*) >90 mL/min  CARDIAC PANEL(CRET KIN+CKTOT+MB+TROPI)     Status: Normal   Collection Time   02/08/12  6:40 AM      Component Value Range   Total CK 40  7 - 177 U/L   CK, MB 1.4  0.3 - 4.0 ng/mL   Troponin I <0.30  <0.30 ng/mL   Relative Index RELATIVE INDEX IS INVALID  0.0 - 2.5  LIPID PANEL     Status: Abnormal   Collection Time   02/08/12  6:40 AM      Component Value Range   Cholesterol 206 (*) 0 - 200 mg/dL   Triglycerides 696 (*) <150 mg/dL   HDL 43  >29 mg/dL   Total CHOL/HDL Ratio 4.8     VLDL 36  0 - 40 mg/dL   LDL Cholesterol 528 (*) 0 - 99 mg/dL    Studies/Results: Dg Chest 2 View  02/07/2012  *RADIOLOGY REPORT*  Clinical Data: Dizziness  CHEST - 2 VIEW   Comparison: 07/15/2008  Findings: Normal heart size.  Increased AP diameter of the chest and bronchitic changes are likely related to COPD.  Scarring in both lung apices is stable.  No new consolidation, pleural effusion or pneumothorax.  No acute bony deformity.  IMPRESSION: No active cardiopulmonary disease.  Chronic changes.  Original Report Authenticated By: Donavan Burnet, M.D.   Ct Head Wo Contrast  02/07/2012  *RADIOLOGY REPORT*  Clinical Data: 71 year old female with dizziness, weakness and left hand numbness.  CT HEAD WITHOUT CONTRAST  Technique:  Contiguous axial images were obtained from the base of the skull through the vertex without contrast.  Comparison: 06/22/2011  Findings: Moderate atrophy and moderate to severe chronic small vessel white matter ischemic changes are again noted. 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  No acute intracranial abnormalities are identified, including mass lesion or mass effect, hydrocephalus, extra-axial fluid collection, midline shift, hemorrhage, or acute infarction.  The visualized bony calvarium is unremarkable. Mucosal thickening within the right maxillary sinus again noted.  IMPRESSION: No evidence of acute intracranial abnormality.  Atrophy, chronic small vessel white matter ischemic changes and remote basal ganglia infarcts.  Original Report Authenticated By: Rosendo Gros, M.D.    Medications:  Reviewed.  Tele:  SR. Patient Active Hospital Problem List: Chest pain (02/07/2012)   Assessment: Has ruled out for MI.  CP was atypical  Not like she had in the past with angina.  I am not convinced it was cardiac.  I would follow.  Would hold further testing for now.  (reportedly had a neg cardiolite prior to last intervention) Has appt 7/8 with P Nishan  Dizziness.  No severe episodes since admit.  That occurred suddenly while patient was sitting.  CT head negative.  Patient is not orrthostatic on vitals just done.  Question if neuro  etiol.  Patient has chronic light headed  feeling (mild, like she wants to tilt to one way).   Would recomm referral to neurology. Encouraged po fluid intake.  Dyslipidemia:  Not on a statin.  Needs to start.  Will start Lipitor 10.  F/U as outpatient  HTN:  FOllow. D/C today.   LOS: 1 day   Dietrich Pates 02/08/2012, 8:49 AM

## 2012-02-16 ENCOUNTER — Ambulatory Visit (INDEPENDENT_AMBULATORY_CARE_PROVIDER_SITE_OTHER): Payer: Medicare Other | Admitting: Cardiovascular Disease

## 2012-02-16 ENCOUNTER — Encounter: Payer: Self-pay | Admitting: Cardiovascular Disease

## 2012-02-16 VITALS — BP 165/80 | HR 76 | Wt 154.0 lb

## 2012-02-16 DIAGNOSIS — H539 Unspecified visual disturbance: Secondary | ICD-10-CM

## 2012-02-16 DIAGNOSIS — Z9189 Other specified personal risk factors, not elsewhere classified: Secondary | ICD-10-CM

## 2012-02-16 DIAGNOSIS — R079 Chest pain, unspecified: Secondary | ICD-10-CM

## 2012-02-16 DIAGNOSIS — I2510 Atherosclerotic heart disease of native coronary artery without angina pectoris: Secondary | ICD-10-CM

## 2012-02-16 DIAGNOSIS — R42 Dizziness and giddiness: Secondary | ICD-10-CM

## 2012-02-16 DIAGNOSIS — G450 Vertebro-basilar artery syndrome: Secondary | ICD-10-CM

## 2012-02-16 NOTE — Assessment & Plan Note (Signed)
Likely vertigo or inner ear issues.  Not likely cardiac.  Event monitor and MRA/MRI r/o posterior circulation issues.  She has had MRI in 2012 with ? Lacunar infarcts

## 2012-02-16 NOTE — Assessment & Plan Note (Signed)
Some compliance issues.  Told to to take a full Diovan tablet and not Hyzaar.  She will call us with dose

## 2012-02-16 NOTE — Progress Notes (Signed)
Patient ID: Colleen Wright, female   DOB: August 17, 1941, 71 y.o.   MRN: [id number] 71 yo patient of Dr Patty Sermons.  Distant history of PLB PCI in 2009.  Just D/C from hospital 6/23 seen by Dr Tenny Craw.  Dizzyness and chest pain.  R/O not postural.  No med changes and D/C for outpatient f/u ? Myovue.  Dizzyness and nausea resolved  CT head negative She seems anxious.  Still having some chest pains and she is very concerned about ongoing dizzyness.  Not postural.  Has had 3 more episodes since D/C  Nausea is no longer present.  Taking meclizine.  Not taking BP meds all the time Did not change to Hyzaar as D/C notes indicate  Likes Diovan and only takes it sporadically and sometimes 1/2 tablet.    ROS: Denies fever, malais, weight loss, blurry vision, decreased visual acuity, cough, sputum, SOB, hemoptysis, pleuritic pain, palpitaitons, heartburn, abdominal pain, melena, lower extremity edema, claudication, or rash.  All other systems reviewed and negative  General: Affect appropriate Healthy:  appears stated age HEENT: normal Neck supple with no adenopathy JVP normal no bruits no thyromegaly Lungs clear with no wheezing and good diaphragmatic motion Heart:  S1/S2 no murmur, no rub, gallop or click PMI normal Abdomen: benighn, BS positve, no tenderness, no AAA no bruit.  No HSM or HJR Distal pulses intact with no bruits No edema Neuro non-focal Skin warm and dry No muscular weakness   Current Outpatient Prescriptions  Medication Sig Dispense Refill  . ALPRAZolam (XANAX) 0.5 MG tablet Take 0.5 tablets (0.25 mg total) by mouth at bedtime as needed for sleep or anxiety.  15 tablet  2  . aspirin 325 MG tablet Take 325 mg by mouth daily. Patient used this medication for dizziness.      Marland Kitchen atorvastatin (LIPITOR) 10 MG tablet Take 1 tablet (10 mg total) by mouth daily at 6 PM.  30 tablet  3  . estrogen, conjugated,-medroxyprogesterone (PREMPRO) 0.625-2.5 MG per tablet Pt only takes once weekly      .  levothyroxine (SYNTHROID, LEVOTHROID) 112 MCG tablet Take 1 tablet (112 mcg total) by mouth daily.  30 tablet  5  . meclizine (ANTIVERT) 25 MG tablet Take 1 tablet (25 mg total) by mouth 3 (three) times daily as needed.  30 tablet  1  . nitroGLYCERIN (NITROSTAT) 0.4 MG SL tablet Place 1 tablet (0.4 mg total) under the tongue every 5 (five) minutes x 3 doses as needed for chest pain.  25 tablet  3  . Valsartan (DIOVAN PO) Take 1 tablet by mouth daily.        Allergies  Amoxicillin  Electrocardiogram:  02/09/12  NSR rate 89 normal ECG  Assessment and Plan

## 2012-02-16 NOTE — Assessment & Plan Note (Signed)
Recurring SSCP with previous PCI PLB in 2009  F/u stress myovue

## 2012-02-16 NOTE — Patient Instructions (Signed)
Your physician wants you to follow-up in:  3 MONTHS WITH DR Haywood Filler will receive a reminder letter in the mail two months in advance. If you don't receive a letter, please call our office to schedule the follow-up appointment. Your physician has requested that you have en exercise stress myoview. For further information please visit [email or web address] Please follow instruction sheet, as given. DX CHEST PAIN Your physician has recommended that you wear an event monitor. Event monitors are medical devices that record the heart's electrical activity. Doctors most often [place] these monitors to diagnose arrhythmias. Arrhythmias are problems with the speed or rhythm of the heartbeat. The monitor is a small, portable device. You can wear one while you do your normal daily activities. This is usually used to diagnose what is causing palpitations/syncope (passing out). DX  785.1  MRI OF BRAIN  DX   DIZZINESS

## 2012-02-17 ENCOUNTER — Telehealth: Payer: Self-pay | Admitting: Cardiovascular Disease

## 2012-02-17 MED ORDER — LOSARTAN POTASSIUM-HCTZ 100-12.5 MG PO TABS: 1.0000 | ORAL_TABLET | Freq: Every day | ORAL | Status: DC

## 2012-02-17 NOTE — Telephone Encounter (Signed)
Yes continue what she is taking

## 2012-02-17 NOTE — Telephone Encounter (Signed)
New msg Pt was calling back to tell you she takes losartan hctz 100/12.5 mg

## 2012-02-17 NOTE — Telephone Encounter (Signed)
NOT TAKING DIOVAN  IS TAKING LOSARTAN HCT 100/12.5 MG  IS PT TO CONT WITH  LASARTAN?

## 2012-02-22 ENCOUNTER — Ambulatory Visit: Payer: Medicare Other | Admitting: Family Medicine

## 2012-02-23 ENCOUNTER — Ambulatory Visit: Payer: Medicare Other | Admitting: Family Medicine

## 2012-02-24 ENCOUNTER — Telehealth: Payer: Self-pay | Admitting: Cardiovascular Disease

## 2012-02-24 NOTE — Telephone Encounter (Signed)
New Problem:    Called needing Dr. Eden Emms to sign the order that is in the system for the patient's MRI because their appointment is tomorrow.  No need to call back.

## 2012-02-25 ENCOUNTER — Encounter (INDEPENDENT_AMBULATORY_CARE_PROVIDER_SITE_OTHER): Payer: Medicare Other

## 2012-02-25 ENCOUNTER — Ambulatory Visit (HOSPITAL_COMMUNITY)
Admission: RE | Admit: 2012-02-25 | Discharge: 2012-02-25 | Disposition: A | Discharge status: Home / Self Care | Payer: Medicare Other | Source: Ambulatory Visit | Attending: Cardiovascular Disease | Admitting: Cardiovascular Disease

## 2012-02-25 ENCOUNTER — Other Ambulatory Visit: Payer: Self-pay | Admitting: Cardiovascular Disease

## 2012-02-25 ENCOUNTER — Other Ambulatory Visit (HOSPITAL_COMMUNITY): Payer: Medicare Other

## 2012-02-25 DIAGNOSIS — G319 Degenerative disease of nervous system, unspecified: Secondary | ICD-10-CM | POA: Diagnosis not present

## 2012-02-25 DIAGNOSIS — R42 Dizziness and giddiness: Secondary | ICD-10-CM | POA: Insufficient documentation

## 2012-02-25 DIAGNOSIS — I6789 Other cerebrovascular disease: Secondary | ICD-10-CM | POA: Diagnosis not present

## 2012-02-25 MED ORDER — GADOBENATE DIMEGLUMINE 529 MG/ML IV SOLN
15.0000 mL | Freq: Once | INTRAVENOUS | Status: AC
  Administered 2012-02-25: 15 mL via INTRAVENOUS

## 2012-03-01 ENCOUNTER — Encounter (HOSPITAL_COMMUNITY): Payer: Medicare Other

## 2012-03-01 ENCOUNTER — Ambulatory Visit (HOSPITAL_COMMUNITY): Payer: Medicare Other | Attending: Cardiovascular Disease | Admitting: Radiology

## 2012-03-01 ENCOUNTER — Telehealth: Payer: Self-pay | Admitting: Cardiovascular Disease

## 2012-03-01 VITALS — BP 136/82 | HR 63 | Ht 62.0 in | Wt 156.0 lb

## 2012-03-01 DIAGNOSIS — R079 Chest pain, unspecified: Secondary | ICD-10-CM

## 2012-03-01 DIAGNOSIS — R42 Dizziness and giddiness: Secondary | ICD-10-CM | POA: Diagnosis not present

## 2012-03-01 DIAGNOSIS — R55 Syncope and collapse: Secondary | ICD-10-CM | POA: Insufficient documentation

## 2012-03-01 DIAGNOSIS — I059 Rheumatic mitral valve disease, unspecified: Secondary | ICD-10-CM | POA: Insufficient documentation

## 2012-03-01 DIAGNOSIS — R002 Palpitations: Secondary | ICD-10-CM | POA: Insufficient documentation

## 2012-03-01 DIAGNOSIS — R5381 Other malaise: Secondary | ICD-10-CM | POA: Insufficient documentation

## 2012-03-01 DIAGNOSIS — R0789 Other chest pain: Secondary | ICD-10-CM | POA: Insufficient documentation

## 2012-03-01 DIAGNOSIS — R000 Tachycardia, unspecified: Secondary | ICD-10-CM | POA: Insufficient documentation

## 2012-03-01 DIAGNOSIS — I10 Essential (primary) hypertension: Secondary | ICD-10-CM | POA: Diagnosis not present

## 2012-03-01 DIAGNOSIS — Z87891 Personal history of nicotine dependence: Secondary | ICD-10-CM | POA: Insufficient documentation

## 2012-03-01 DIAGNOSIS — E785 Hyperlipidemia, unspecified: Secondary | ICD-10-CM | POA: Diagnosis not present

## 2012-03-01 DIAGNOSIS — R5383 Other fatigue: Secondary | ICD-10-CM | POA: Insufficient documentation

## 2012-03-01 DIAGNOSIS — I2510 Atherosclerotic heart disease of native coronary artery without angina pectoris: Secondary | ICD-10-CM

## 2012-03-01 DIAGNOSIS — R110 Nausea: Secondary | ICD-10-CM | POA: Diagnosis not present

## 2012-03-01 DIAGNOSIS — Z8249 Family history of ischemic heart disease and other diseases of the circulatory system: Secondary | ICD-10-CM | POA: Diagnosis not present

## 2012-03-01 MED ORDER — TECHNETIUM TC 99M TETROFOSMIN IV KIT
11.0000 | PACK | Freq: Once | INTRAVENOUS | Status: AC | PRN
  Administered 2012-03-01: 11 via INTRAVENOUS

## 2012-03-01 MED ORDER — TECHNETIUM TC 99M TETROFOSMIN IV KIT
33.0000 | PACK | Freq: Once | INTRAVENOUS | Status: AC | PRN
  Administered 2012-03-01: 33 via INTRAVENOUS

## 2012-03-01 NOTE — Progress Notes (Signed)
[facility] SITE 3 NUCLEAR MED [address] [place] [address] [phone]  Cardiology Nuclear Med Study  Colleen Wright is a 71 y.o. female     MRN : [id number]     DOB: [birth year]  Procedure Date: 03/01/2012  Nuclear Med Background Indication for Stress Test:  Evaluation for Ischemia and PTCA Patency History:  3/09 MPS:No ischemia, EF=70%; 6/09 PTCA-RCA; 11/09 Echo:EF=65-70%, mild MVR Cardiac Risk Factors: Family History - CAD, History of Smoking, Hypertension and Lipids  Symptoms:  Chest Pressure.  (last episode of chest discomfort was about one week ago), Dizziness, Fatigue, Nausea with Near Syncope, Palpitations and Rapid HR   Nuclear Pre-Procedure Caffeine/Decaff Intake:  10:00pm NPO After: 6:00pm   Lungs:  Clear. IV 0.9% NS with Angio Cath:  20g  IV Site: R Antecubital x 1, tolerated well IV Started by:  Irean Hong, RN  Chest Size (in):  36 Cup Size: C  Height: 5\' 2"  (1.575 m)  Weight:  156 lb (70.761 kg)  BMI:  Body mass index is 28.53 kg/(m^2). Tech Comments:  n/a    Nuclear Med Study 1 or 2 day study: 1 day  Stress Test Type:  Stress  Reading MD: Charlton Haws, MD  Order Authorizing Provider:  Charlton Haws, MD  Resting Radionuclide: Technetium 39m Tetrofosmin  Resting Radionuclide Dose: 11.0 mCi   Stress Radionuclide:  Technetium 66m Tetrofosmin  Stress Radionuclide Dose: 33.0 mCi           Stress Protocol Rest HR: 63 Stress HR: 141  Rest BP: 136/82 Stress BP: 176/73  Exercise Time (min): 4:30 METS: 6.4   Predicted Max HR: 147 bpm % Max HR: 95.92 bpm Rate Pressure Product: 21308   Dose of Adenosine (mg):  n/a Dose of Lexiscan: n/a mg  Dose of Atropine (mg): n/a Dose of Dobutamine: n/a mcg/kg/min (at max HR)  Stress Test Technologist: Smiley Houseman, CMA-N  Nuclear Technologist:  Domenic Polite, CNMT     Rest Procedure:  Myocardial perfusion imaging was performed at rest 45 minutes following the intravenous administration of  Technetium 42m Tetrofosmin.  Rest ECG: No acute changes  Stress Procedure:  The patient exercised on the treadmill utilizing the Bruce protocol for 4:30 minutes. She then stopped due to fatigue.  She c/o chest tightness, 4-5/10, with exercise.  There were nonspecific ST-T wave changes and occasional PVC's/PAC's noted.  Technetium 89m Tetrofosmin was injected at peak exercise and myocardial perfusion imaging was performed after a brief delay.  Stress ECG: No significant change from baseline ECG  QPS Raw Data Images:  Normal; no motion artifact; normal heart/lung ratio. Stress Images:  Normal homogeneous uptake in all areas of the myocardium. Rest Images:  Normal homogeneous uptake in all areas of the myocardium. Subtraction (SDS):  Normal Transient Ischemic Dilatation (Normal <1.22):  1.06 Lung/Heart Ratio (Normal <0.45):  0.31  Quantitative Gated Spect Images QGS EDV:  55 ml QGS ESV:  12 ml  Impression Exercise Capacity:  Poor exercise capacity. BP Response:  Normal blood pressure response. Clinical Symptoms:  Atypical chest pain. ECG Impression:  No significant ST segment change suggestive of ischemia. Comparison with Prior Nuclear Study: No images to compare  Overall Impression:  Normal stress nuclear study.  LV Ejection Fraction: 77%.  LV Wall Motion:  NL LV Function; NL Wall Motion   Charlton Haws

## 2012-03-01 NOTE — Telephone Encounter (Signed)
Fu call °Pt returning your call  °

## 2012-03-01 NOTE — Telephone Encounter (Signed)
SEE MRI OF BRAIN RESULT NOTES .Colleen Wright

## 2012-03-07 ENCOUNTER — Other Ambulatory Visit: Payer: Self-pay

## 2012-03-07 MED ORDER — LOSARTAN POTASSIUM-HCTZ 100-12.5 MG PO TABS: 1.0000 | ORAL_TABLET | Freq: Every day | ORAL | Status: DC

## 2012-03-23 ENCOUNTER — Telehealth: Payer: Self-pay | Admitting: *Deleted

## 2012-03-23 NOTE — Telephone Encounter (Signed)
PT AWARE OF MONITOR RESULTS  PER DR NISHAN SINUS RHYTHM NO ARRYTHMIAS  WITH SYMPTOMS./CY

## 2012-03-25 ENCOUNTER — Telehealth: Payer: Self-pay

## 2012-03-25 MED ORDER — ALPRAZOLAM 1 MG PO TABS: 1.0000 mg | ORAL_TABLET | Freq: Every evening | ORAL | Status: DC | PRN

## 2012-03-25 NOTE — Telephone Encounter (Signed)
Pt called stating the .5 tablet of Lorazepam is not working? Patient would like to know if she can go back to 1 full tablet and have an RX for 30 sent to her pharmacy? Please advise?  I informed pt that if she doesn't hear from me by 2 or 2:30 to call the pharmacy to see if RX is ready and if theres an issue from MD I would contact her before this time. Pt voiced being ok with plan

## 2012-03-25 NOTE — Telephone Encounter (Signed)
She can have a full tab if that works better. Alprazolam 0.5 mg tab 1 tab po qhs prn insomnia, anxiety, disp # 30 1 refill. Come in for visit if this is not helpful or she does not tolerate this change

## 2012-04-21 ENCOUNTER — Telehealth: Payer: Self-pay | Admitting: Family Medicine

## 2012-04-21 NOTE — Telephone Encounter (Signed)
Patient is requesting something else for her hot flashes, the Prempro is over $100. She did mention that Dr Hyacinth Meeker had prescribed a cheaper generic years ago but she doesn't want to take that, it made her hair fall out. She could not find the bottle so she doesn't know what the name of the generic was. Is there something else she can get an Rx for?

## 2012-04-22 DIAGNOSIS — Z23 Encounter for immunization: Secondary | ICD-10-CM | POA: Diagnosis not present

## 2012-04-22 NOTE — Telephone Encounter (Signed)
Left a detailed message on patients voicemail.

## 2012-04-22 NOTE — Telephone Encounter (Signed)
So please let the patient know that my generic resource on average drug prices reports all of the medicaitons in that class are fairly expensive, her best bet is to check with her pharmacist, he can pull up her insurance plan and compare what her plan would pay the best on that he carries and could let her have a decent price on and then I can prescribe that as long as it was appropriate. Every insurance and every pharmacy puts different prices on different meds

## 2012-05-03 ENCOUNTER — Ambulatory Visit: Payer: Medicare Other | Admitting: Cardiovascular Disease

## 2012-05-23 ENCOUNTER — Ambulatory Visit: Payer: Medicare Other | Admitting: Family Medicine

## 2012-05-23 ENCOUNTER — Other Ambulatory Visit (HOSPITAL_COMMUNITY)
Admission: RE | Admit: 2012-05-23 | Discharge: 2012-05-23 | Disposition: A | Discharge status: Home / Self Care | Payer: Medicare Other | Source: Ambulatory Visit | Attending: Family Medicine | Admitting: Family Medicine

## 2012-05-23 ENCOUNTER — Encounter: Payer: Self-pay | Admitting: Family Medicine

## 2012-05-23 ENCOUNTER — Ambulatory Visit (INDEPENDENT_AMBULATORY_CARE_PROVIDER_SITE_OTHER): Payer: Medicare Other | Admitting: Family Medicine

## 2012-05-23 ENCOUNTER — Encounter: Payer: Medicare Other | Admitting: Family Medicine

## 2012-05-23 DIAGNOSIS — Z124 Encounter for screening for malignant neoplasm of cervix: Secondary | ICD-10-CM | POA: Insufficient documentation

## 2012-05-23 DIAGNOSIS — R319 Hematuria, unspecified: Secondary | ICD-10-CM | POA: Diagnosis not present

## 2012-05-23 DIAGNOSIS — I10 Essential (primary) hypertension: Secondary | ICD-10-CM | POA: Diagnosis not present

## 2012-05-23 DIAGNOSIS — N951 Menopausal and female climacteric states: Secondary | ICD-10-CM | POA: Diagnosis not present

## 2012-05-23 DIAGNOSIS — E039 Hypothyroidism, unspecified: Secondary | ICD-10-CM

## 2012-05-23 DIAGNOSIS — K219 Gastro-esophageal reflux disease without esophagitis: Secondary | ICD-10-CM

## 2012-05-23 DIAGNOSIS — R109 Unspecified abdominal pain: Secondary | ICD-10-CM

## 2012-05-23 DIAGNOSIS — E785 Hyperlipidemia, unspecified: Secondary | ICD-10-CM | POA: Diagnosis not present

## 2012-05-23 DIAGNOSIS — M79609 Pain in unspecified limb: Secondary | ICD-10-CM

## 2012-05-23 DIAGNOSIS — M79672 Pain in left foot: Secondary | ICD-10-CM

## 2012-05-23 DIAGNOSIS — L989 Disorder of the skin and subcutaneous tissue, unspecified: Secondary | ICD-10-CM

## 2012-05-23 HISTORY — DX: Disorder of the skin and subcutaneous tissue, unspecified: L98.9

## 2012-05-23 HISTORY — DX: Pain in left foot: M79.672

## 2012-05-23 LAB — POCT URINALYSIS DIPSTICK
Bilirubin, UA: NEGATIVE
Glucose, UA: NEGATIVE
Nitrite, UA: NEGATIVE
Urobilinogen, UA: 0.2

## 2012-05-23 MED ORDER — ATORVASTATIN CALCIUM 10 MG PO TABS: 10.0000 mg | ORAL_TABLET | Freq: Every day | ORAL | Status: DC

## 2012-05-23 MED ORDER — NORETHINDRONE-ETH ESTRADIOL 0.5-2.5 MG-MCG PO TABS: 1.0000 | ORAL_TABLET | Freq: Every day | ORAL | Status: DC

## 2012-05-23 NOTE — Assessment & Plan Note (Signed)
2  Small, scaly, erythematous patches on back, AK vs eczema, she has a dermatologist and agrees to call and go in for exam, offered a steroid cream to try while she waits and declines

## 2012-05-23 NOTE — Assessment & Plan Note (Signed)
Posterior ankle, try Aspercreme prn at least 3 x a day and consider prescription and referral if worsens or does not resolve

## 2012-05-23 NOTE — Assessment & Plan Note (Signed)
Well controlled 

## 2012-05-23 NOTE — Assessment & Plan Note (Signed)
On stable dose of Levothyroxine, continue same and check TSH with next visit

## 2012-05-23 NOTE — Progress Notes (Signed)
Patient ID: Colleen Wright, female   DOB: Feb 11, 1941, 71 y.o.   MRN: [id number] Colleen Wright [id number] [birth year] 05/23/2012      Progress Note New Patient  Subjective  Chief Complaint  Chief Complaint  Patient presents with  . Annual Exam    physical  . Gynecologic Exam    pap and breast exam    HPI  Patient is a 71 year old Caucasian female who is in today for Pap and followup. Overall she's doing well well. She does note that a couple months ago she was working in her garden she cannot posterior left ankle for instance been sore ever since. It is worse with ambulation but that does not swell or get warm or hot. It does not hurt when she is not moving. She never broke the skin but does not was mildly ecchymotic at the time it occurred. She's complaining of a itchy red spot on her back which is been present for a month or 2.  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 No other recent illness. No recurrent chest pain, palpitations, shortness of breath, GI or GU concerns. She denies any vaginal discharge or lesions. She denies any breast changes or pain. She does receive her mammograms at [facility]. Has stopped her Lipitor but agrees to restart today. Past Medical History  Diagnosis Date  . Colon polyps   . Allergy   . Arthritis     low back  . Cancer     thyroid, s/p thyroidectomy 8 years ago  . Depression   . GERD (gastroesophageal reflux disease)   . Hyperlipidemia   . Hypertension   . Ulcer   . Hypothyroidism 06/12/2011  . Hiatal hernia   . CAD (coronary artery disease) 06/12/2011  . Back pain 06/13/2011  . Vertigo 06/13/2011  . Leg fracture   . Thyroid cancer   . Skin lesions, generalized 05/23/2012    Past Surgical History  Procedure Date  . Thyroidectomy, partial     twice  . Fracture surgery     left leg, pin and 5 bolts in place in lower tibia  . Tubal ligation     growth removed from posterior vaginal vault  . Lesion removal     vaginal    Family  History  Problem Relation Age of Onset  . Cancer Mother     ovary/uterus-can't remember  . Stroke Mother   . Heart disease Father   . Heart disease Sister     bypass  . Stroke Sister   . Cancer Sister     LN, bone  . Hypertension Sister   . Heart disease Brother     bypass  . Cancer Cousin     breast    History   Social History  . Marital Status: Married    Spouse Name: N/A    Number of Children: N/A  . Years of Education: N/A   Occupational History  . Not on file.   Social History Main Topics  . Smoking status: Never Smoker   . Smokeless tobacco: Never Used  . Alcohol Use: No  . Drug Use: No  . Sexually Active: No   Other Topics Concern  . Not on file   Social History Narrative  . No narrative on file    Current Outpatient Prescriptions on File Prior to Visit  Medication Sig Dispense Refill  . ALPRAZolam (XANAX) 1 MG tablet Take 1 tablet (1 mg total) by mouth at bedtime as needed.  30 tablet  1  . aspirin 325 MG tablet Take 325 mg by mouth daily. Patient used this medication for dizziness.      Marland Kitchen levothyroxine (SYNTHROID, LEVOTHROID) 112 MCG tablet Take 1 tablet (112 mcg total) by mouth daily.  30 tablet  5  . losartan-hydrochlorothiazide (HYZAAR) 100-12.5 MG per tablet Take 1 tablet by mouth daily.  90 tablet  1  . nitroGLYCERIN (NITROSTAT) 0.4 MG SL tablet Place 1 tablet (0.4 mg total) under the tongue every 5 (five) minutes x 3 doses as needed for chest pain.  25 tablet  3  . norethindrone-ethinyl estradiol (FEMHRT LOW DOSE) 0.5-2.5 MG-MCG per tablet Take 1 tablet by mouth daily.  30 tablet  3  . DISCONTD: atorvastatin (LIPITOR) 10 MG tablet Take 1 tablet (10 mg total) by mouth daily at 6 PM.  30 tablet  3    Allergies  Allergen Reactions  . Amoxicillin Other (See Comments)    Patient develops a very bad yeast infection.    Review of Systems  Review of Systems  Constitutional: Negative for fever, chills and malaise/fatigue.  HENT: Negative for  hearing loss, nosebleeds and congestion.   Eyes: Negative for discharge.  Respiratory: Negative for cough, sputum production, shortness of breath and wheezing.   Cardiovascular: Negative for chest pain, palpitations and leg swelling.  Gastrointestinal: Negative for heartburn, nausea, vomiting, abdominal pain, diarrhea, constipation and blood in stool.  Genitourinary: Negative for dysuria, urgency, frequency and hematuria.  Musculoskeletal: Positive for joint pain. Negative for myalgias, back pain and falls.       Left posterior ankle pain for past couple months, worse with walking. She says she caught it on a fence in her yard, never got swollen or bleed but has been uncomfortable ever since. There was some mild ecchymosis but that is gone now  Skin: Negative for rash.  Neurological: Negative for dizziness, tremors, sensory change, focal weakness, loss of consciousness, weakness and headaches.  Endo/Heme/Allergies: Negative for polydipsia. Does not bruise/bleed easily.  Psychiatric/Behavioral: Negative for depression and suicidal ideas. The patient is not nervous/anxious and does not have insomnia.     Objective  There were no vitals taken for this visit.  Physical Exam  Physical Exam  Constitutional: She is oriented to person, place, and time and well-developed, well-nourished, and in no distress. No distress.  HENT:  Head: Normocephalic and atraumatic.  Right Ear: External ear normal.  Left Ear: External ear normal.  Nose: Nose normal.  Mouth/Throat: Oropharynx is clear and moist. No oropharyngeal exudate.  Eyes: Conjunctivae normal are normal. Pupils are equal, round, and reactive to light. Right eye exhibits no discharge. Left eye exhibits no discharge. No scleral icterus.  Neck: Normal range of motion. Neck supple. No thyromegaly present.  Cardiovascular: Normal rate, regular rhythm, normal heart sounds and intact distal pulses.   No murmur heard. Pulmonary/Chest: Effort normal  and breath sounds normal. No respiratory distress. She has no wheezes. She has no rales.  Abdominal: Soft. Bowel sounds are normal. She exhibits no distension and no mass. There is no tenderness.  Genitourinary: Vagina normal, uterus normal, cervix normal, right adnexa normal and left adnexa normal. No vaginal discharge found.  Musculoskeletal: Normal range of motion. She exhibits no edema and no tenderness.  Lymphadenopathy:    She has no cervical adenopathy.  Neurological: She is alert and oriented to person, place, and time. She has normal reflexes. No cranial nerve deficit. Coordination normal.  Skin: Skin is warm and dry. No rash  noted. She is not diaphoretic.  Psychiatric: Mood, memory and affect normal.       Assessment & Plan  HTN (hypertension) Well controlled  Hyperlipidemia Agrees to restart Atorvastatin, she believes she tolerated this, recheck lipids in 3-4 months, start MegaRed caps daily  Hypothyroidism On stable dose of Levothyroxine, continue same and check TSH with next visit  Skin lesions, generalized 2  Small, scaly, erythematous patches on back, AK vs eczema, she has a dermatologist and agrees to call and go in for exam, offered a steroid cream to try while she waits and declines  GERD (gastroesophageal reflux disease) Well controlled with meds and dietary changes  Cervical cancer screening Pap done today, agrees to schedule next [facility] with [facility], we will request old records from there to review. Will continue annual paps due to h/o cancer  Pain of left heel Posterior ankle, try Aspercreme prn at least 3 x a day and consider prescription and referral if worsens or does not resolve

## 2012-05-23 NOTE — Addendum Note (Signed)
Addended by: Baldemar Lenis R on: 05/23/2012 01:29 PM   Modules accepted: Orders

## 2012-05-23 NOTE — Assessment & Plan Note (Signed)
Well controlled with meds and dietary changes

## 2012-05-23 NOTE — Patient Instructions (Addendum)

## 2012-05-23 NOTE — Assessment & Plan Note (Signed)
Agrees to restart Atorvastatin, she believes she tolerated this, recheck lipids in 3-4 months, start MegaRed caps daily

## 2012-05-23 NOTE — Assessment & Plan Note (Signed)
Pap done today, agrees to schedule next [facility] with [facility], we will request old records from there to review. Will continue annual paps due to h/o cancer

## 2012-05-25 LAB — URINE CULTURE: Colony Count: >=100000

## 2012-05-25 MED ORDER — CIPROFLOXACIN HCL 250 MG PO TABS: 250.0000 mg | ORAL_TABLET | Freq: Two times a day (BID) | ORAL | Status: DC

## 2012-05-25 NOTE — Addendum Note (Signed)
Addended by: Court Joy on: 05/25/2012 03:48 PM   Modules accepted: Orders

## 2012-05-25 NOTE — Progress Notes (Signed)
Quick Note:  Patient Informed and voiced understanding.  RX sent to pharmacy ______ 

## 2012-06-08 ENCOUNTER — Emergency Department (HOSPITAL_BASED_OUTPATIENT_CLINIC_OR_DEPARTMENT_OTHER)
Admission: EM | Admit: 2012-06-08 | Discharge: 2012-06-08 | Disposition: A | Discharge status: Home / Self Care | Payer: Medicare Other | Attending: Emergency Medicine | Admitting: Emergency Medicine

## 2012-06-08 ENCOUNTER — Encounter (HOSPITAL_BASED_OUTPATIENT_CLINIC_OR_DEPARTMENT_OTHER): Payer: Self-pay

## 2012-06-08 ENCOUNTER — Emergency Department (HOSPITAL_BASED_OUTPATIENT_CLINIC_OR_DEPARTMENT_OTHER): Payer: Medicare Other

## 2012-06-08 DIAGNOSIS — I10 Essential (primary) hypertension: Secondary | ICD-10-CM | POA: Diagnosis not present

## 2012-06-08 DIAGNOSIS — M1710 Unilateral primary osteoarthritis, unspecified knee: Secondary | ICD-10-CM | POA: Diagnosis not present

## 2012-06-08 DIAGNOSIS — R42 Dizziness and giddiness: Secondary | ICD-10-CM | POA: Diagnosis not present

## 2012-06-08 DIAGNOSIS — Z85850 Personal history of malignant neoplasm of thyroid: Secondary | ICD-10-CM | POA: Insufficient documentation

## 2012-06-08 DIAGNOSIS — R110 Nausea: Secondary | ICD-10-CM | POA: Insufficient documentation

## 2012-06-08 DIAGNOSIS — M25461 Effusion, right knee: Secondary | ICD-10-CM

## 2012-06-08 DIAGNOSIS — K449 Diaphragmatic hernia without obstruction or gangrene: Secondary | ICD-10-CM | POA: Insufficient documentation

## 2012-06-08 DIAGNOSIS — M25469 Effusion, unspecified knee: Secondary | ICD-10-CM | POA: Diagnosis not present

## 2012-06-08 DIAGNOSIS — Z7982 Long term (current) use of aspirin: Secondary | ICD-10-CM | POA: Insufficient documentation

## 2012-06-08 DIAGNOSIS — Z79899 Other long term (current) drug therapy: Secondary | ICD-10-CM | POA: Diagnosis not present

## 2012-06-08 DIAGNOSIS — E890 Postprocedural hypothyroidism: Secondary | ICD-10-CM | POA: Diagnosis not present

## 2012-06-08 DIAGNOSIS — E785 Hyperlipidemia, unspecified: Secondary | ICD-10-CM | POA: Insufficient documentation

## 2012-06-08 DIAGNOSIS — M47817 Spondylosis without myelopathy or radiculopathy, lumbosacral region: Secondary | ICD-10-CM | POA: Insufficient documentation

## 2012-06-08 DIAGNOSIS — I2510 Atherosclerotic heart disease of native coronary artery without angina pectoris: Secondary | ICD-10-CM | POA: Diagnosis not present

## 2012-06-08 DIAGNOSIS — IMO0002 Reserved for concepts with insufficient information to code with codable children: Secondary | ICD-10-CM | POA: Diagnosis not present

## 2012-06-08 DIAGNOSIS — G8929 Other chronic pain: Secondary | ICD-10-CM | POA: Insufficient documentation

## 2012-06-08 MED ORDER — MELOXICAM 7.5 MG PO TABS: 7.5000 mg | ORAL_TABLET | Freq: Every day | ORAL | Status: DC

## 2012-06-08 NOTE — ED Notes (Signed)
Right pain x

## 2012-06-08 NOTE — ED Notes (Signed)
Right knee pain x 2 weeks denies injury

## 2012-06-08 NOTE — ED Notes (Signed)
Patient back from  X-ray 

## 2012-06-08 NOTE — ED Provider Notes (Signed)
History     CSN: [id number]  Arrival date & time 06/08/12  1343   First MD Initiated Contact with Patient 06/08/12 1400      Chief Complaint  Patient presents with  . Knee Pain    (Consider location/radiation/quality/duration/timing/severity/associated sxs/prior treatment) Patient is a 71 y.o. female presenting with knee pain. The history is provided by the patient. No language interpreter was used.  Knee Pain This is a new problem. The current episode started more than 1 month ago. The problem has been gradually worsening. Associated symptoms include joint swelling and myalgias. The symptoms are aggravated by bending. She has tried nothing for the symptoms. The treatment provided no relief.  Pt reports pain on and off for the past month.     Past Medical History  Diagnosis Date  . Colon polyps   . Allergy   . Arthritis     low back  . Cancer     thyroid, s/p thyroidectomy 8 years ago  . Depression   . GERD (gastroesophageal reflux disease)   . Hyperlipidemia   . Hypertension   . Ulcer   . Hypothyroidism 06/12/2011  . Hiatal hernia   . CAD (coronary artery disease) 06/12/2011  . Back pain 06/13/2011  . Vertigo 06/13/2011  . Leg fracture   . Thyroid cancer   . Skin lesions, generalized 05/23/2012  . Pain of left heel 05/23/2012    Past Surgical History  Procedure Date  . Thyroidectomy, partial     twice  . Fracture surgery     left leg, pin and 5 bolts in place in lower tibia  . Tubal ligation     growth removed from posterior vaginal vault  . Lesion removal     vaginal    Family History  Problem Relation Age of Onset  . Cancer Mother     ovary/uterus-can't remember  . Stroke Mother   . Heart disease Father   . Heart disease Sister     bypass  . Stroke Sister   . Cancer Sister     LN, bone  . Hypertension Sister   . Heart disease Brother     bypass  . Cancer Cousin     breast    History  Substance Use Topics  . Smoking status: Never Smoker     . Smokeless tobacco: Never Used  . Alcohol Use: No    OB History    Grav Para Term Preterm Abortions TAB SAB Ect Mult Living                  Review of Systems  Musculoskeletal: Positive for myalgias and joint swelling.  All other systems reviewed and are negative.    Allergies  Amoxicillin  Home Medications   Current Outpatient Rx  Name Route Sig Dispense Refill  . ALPRAZOLAM 1 MG PO TABS Oral Take 1 tablet (1 mg total) by mouth at bedtime as needed. 30 tablet 1  . ASPIRIN 325 MG PO TABS Oral Take 325 mg by mouth daily. Patient used this medication for dizziness.    . ATORVASTATIN CALCIUM 10 MG PO TABS Oral Take 1 tablet (10 mg total) by mouth daily at 6 PM. 30 tablet 5  . CIPROFLOXACIN HCL 250 MG PO TABS Oral Take 1 tablet (250 mg total) by mouth 2 (two) times daily. 10 tablet 0  . LEVOTHYROXINE SODIUM 112 MCG PO TABS Oral Take 1 tablet (112 mcg total) by mouth daily. 30 tablet 5  . LOSARTAN  POTASSIUM-HCTZ 100-12.5 MG PO TABS Oral Take 1 tablet by mouth daily. 90 tablet 1  . NITROGLYCERIN 0.4 MG SL SUBL Sublingual Place 1 tablet (0.4 mg total) under the tongue every 5 (five) minutes x 3 doses as needed for chest pain. 25 tablet 3  . NORETHINDRONE-ETH ESTRADIOL 0.5-2.5 MG-MCG PO TABS Oral Take 1 tablet by mouth daily. 30 tablet 3    BP 150/62  Pulse 83  Temp 98.3 F (36.8 C) (Oral)  Resp 18  Ht 5\' 2"  (1.575 m)  Wt 158 lb (71.668 kg)  BMI 28.90 kg/m2  SpO2 100%  Physical Exam  Nursing note and vitals reviewed. Constitutional: She is oriented to person, place, and time. She appears well-developed and well-nourished.  HENT:  Head: Normocephalic and atraumatic.  Eyes: EOM are normal.  Musculoskeletal: She exhibits tenderness.       Tender right knee joint line,  Swollen,  From,   nv and ns intact,  No instability  Neurological: She is alert and oriented to person, place, and time.  Skin: Skin is warm.  Psychiatric: She has a normal mood and affect.    ED Course   Procedures (including critical care time)  Labs Reviewed - No data to display Dg Knee 1-2 Views Right  06/08/2012  *RADIOLOGY REPORT*  Clinical Data: Anterior knee pain below the patella.  RIGHT KNEE - 1-2 VIEW  Comparison: None.  Findings: Spurring at the quadriceps insertion site on the patella noted.  Mild patellofemoral spurring is observed along with mild medial compartmental articular space narrowing.  No significant prepatellar bursitis.  Trace knee effusion noted.  IMPRESSION: 1.  Trace knee effusion.  2.  Mild osteoarthritis.  3.  Mild spurring at the quadriceps insertion site.   Original Report Authenticated By: Dellia Cloud, M.D.      No diagnosis found.    MDM  Knee sleeve,  Follow up with Dr. Dorice Lamas, [place] 06/08/12 [phone]

## 2012-06-08 NOTE — ED Notes (Signed)
Patient transported to X-ray 

## 2012-06-09 NOTE — ED Provider Notes (Signed)
Medical screening examination/treatment/procedure(s) were performed by non-physician practitioner and as supervising physician I was immediately available for consultation/collaboration.  Geoffery Lyons, MD 06/09/12 [phone]

## 2012-06-29 ENCOUNTER — Ambulatory Visit: Payer: Medicare Other | Admitting: Cardiovascular Disease

## 2012-07-04 ENCOUNTER — Other Ambulatory Visit: Payer: Self-pay

## 2012-07-04 MED ORDER — ALPRAZOLAM 1 MG PO TABS: 1.0000 mg | ORAL_TABLET | Freq: Every evening | ORAL | Status: DC | PRN

## 2012-07-04 NOTE — Telephone Encounter (Signed)
Please advise refill? Last RX wrote on 03-25-12 quantity 30 with 1 refill?  If ok fax to [phone]

## 2012-07-05 MED ORDER — ALPRAZOLAM 1 MG PO TABS: 1.0000 mg | ORAL_TABLET | Freq: Every evening | ORAL | Status: DC | PRN

## 2012-07-05 NOTE — Addendum Note (Signed)
Addended by: Court Joy on: 07/05/2012 08:16 AM   Modules accepted: Orders

## 2012-07-05 NOTE — Telephone Encounter (Signed)
RX wasn't on printer. Reprinted and faxed

## 2012-07-19 ENCOUNTER — Encounter: Payer: Self-pay | Admitting: Cardiovascular Disease

## 2012-07-19 ENCOUNTER — Ambulatory Visit (INDEPENDENT_AMBULATORY_CARE_PROVIDER_SITE_OTHER): Payer: Medicare Other | Admitting: Cardiovascular Disease

## 2012-07-19 VITALS — BP 124/80 | HR 72 | Ht 62.0 in | Wt 159.0 lb

## 2012-07-19 DIAGNOSIS — I10 Essential (primary) hypertension: Secondary | ICD-10-CM | POA: Diagnosis not present

## 2012-07-19 DIAGNOSIS — R0789 Other chest pain: Secondary | ICD-10-CM

## 2012-07-19 DIAGNOSIS — R42 Dizziness and giddiness: Secondary | ICD-10-CM | POA: Diagnosis not present

## 2012-07-19 DIAGNOSIS — E785 Hyperlipidemia, unspecified: Secondary | ICD-10-CM

## 2012-07-19 NOTE — Progress Notes (Signed)
Patient ID: Colleen Wright, female   DOB: 23-Nov-1940, 71 y.o.   MRN: [id number] 71 yo patient of Dr Patty Sermons. Distant history of PLB PCI in 2009. Just D/C from hospital 6/23 seen by Dr Tenny Craw. Dizzyness and chest pain. R/O not postural. No med changes and D/C for outpatient f/u ? Myovue. Dizzyness and nausea resolved CT head negative She seems anxious. Still having some chest pains and she is very concerned about ongoing dizzyness. Not postural. Has had 3 more episodes since D/C Nausea is no longer present. Taking meclizine. BP is better on cozaar.  Still gets occasional dizzy feeling last episode at [facility] no palpitations lasted about 10 minutes and then normal.  MRI 7/13 normal no CVA/TIA  CT 6/13 normal F/U myovue 7/13 normal with EF 73%    ROS: Denies fever, malais, weight loss, blurry vision, decreased visual acuity, cough, sputum, SOB, hemoptysis, pleuritic pain, palpitaitons, heartburn, abdominal pain, melena, lower extremity edema, claudication, or rash.  All other systems reviewed and negative  General: Affect appropriate Healthy:  appears stated age HEENT: normal Neck supple with no adenopathy JVP normal no bruits no thyromegaly Lungs clear with no wheezing and good diaphragmatic motion Heart:  S1/S2 no murmur, no rub, gallop or click PMI normal Abdomen: benighn, BS positve, no tenderness, no AAA no bruit.  No HSM or HJR Distal pulses intact with no bruits No edema Neuro non-focal Skin warm and dry No muscular weakness   Current Outpatient Prescriptions  Medication Sig Dispense Refill  . ALPRAZolam (XANAX) 1 MG tablet Take 1 tablet (1 mg total) by mouth at bedtime as needed.  30 tablet  1  . aspirin 325 MG tablet Take 325 mg by mouth daily. Patient used this medication for dizziness.      Marland Kitchen levothyroxine (SYNTHROID, LEVOTHROID) 112 MCG tablet Take 1 tablet (112 mcg total) by mouth daily.  30 tablet  5  . losartan-hydrochlorothiazide (HYZAAR) 100-12.5 MG per tablet Take 1  tablet by mouth daily.  90 tablet  1  . nitroGLYCERIN (NITROSTAT) 0.4 MG SL tablet Place 1 tablet (0.4 mg total) under the tongue every 5 (five) minutes x 3 doses as needed for chest pain.  25 tablet  3    Allergies  Amoxicillin  Electrocardiogram:  Assessment and Plan

## 2012-07-19 NOTE — Assessment & Plan Note (Signed)
Well controlled.  Continue current medications and low sodium Dash type diet.    

## 2012-07-19 NOTE — Assessment & Plan Note (Signed)
Cholesterol is at goal.  Continue current dose of statin and diet Rx.  No myalgias or side effects.  F/U  LFT's in 6 months. Lab Results  Component Value Date   LDLCALC 127* 02/08/2012

## 2012-07-19 NOTE — Assessment & Plan Note (Signed)
Dizzy spells not obviouis etiology Low likelyhood of heart with normal EF , normal myovue and normal exam and ECG  F/U Dr Rogelia Rohrer

## 2012-07-19 NOTE — Patient Instructions (Signed)
Your physician recommends that you schedule a follow-up appointment in: AS NEEDED  Your physician recommends that you continue on your current medications as directed. Please refer to the Current Medication list given to you today.  

## 2012-07-19 NOTE — Assessment & Plan Note (Signed)
Resolved normal myovue 7/13

## 2012-07-27 ENCOUNTER — Other Ambulatory Visit: Payer: Self-pay | Admitting: Dermatology

## 2012-07-27 DIAGNOSIS — D485 Neoplasm of uncertain behavior of skin: Secondary | ICD-10-CM | POA: Diagnosis not present

## 2012-07-27 DIAGNOSIS — L821 Other seborrheic keratosis: Secondary | ICD-10-CM | POA: Diagnosis not present

## 2012-07-27 DIAGNOSIS — L819 Disorder of pigmentation, unspecified: Secondary | ICD-10-CM | POA: Diagnosis not present

## 2012-07-27 DIAGNOSIS — C44519 Basal cell carcinoma of skin of other part of trunk: Secondary | ICD-10-CM | POA: Diagnosis not present

## 2012-07-27 DIAGNOSIS — L570 Actinic keratosis: Secondary | ICD-10-CM | POA: Diagnosis not present

## 2012-08-03 ENCOUNTER — Other Ambulatory Visit: Payer: Self-pay

## 2012-08-03 MED ORDER — LEVOTHYROXINE SODIUM 112 MCG PO TABS: 112.0000 ug | ORAL_TABLET | Freq: Every day | ORAL | Status: DC

## 2012-08-18 ENCOUNTER — Other Ambulatory Visit: Payer: Self-pay

## 2012-08-18 MED ORDER — LOSARTAN POTASSIUM-HCTZ 100-12.5 MG PO TABS: 1.0000 | ORAL_TABLET | Freq: Every day | ORAL | Status: DC

## 2012-08-18 NOTE — Telephone Encounter (Signed)
If ok fax to [phone]

## 2012-08-18 NOTE — Telephone Encounter (Signed)
Please advise Xanax refill? Last RX was wrote on 07-05-12 quantity 30 with 1 refill. So this is too early to refill

## 2012-09-02 ENCOUNTER — Other Ambulatory Visit: Payer: Self-pay | Admitting: *Deleted

## 2012-09-02 MED ORDER — ALPRAZOLAM 1 MG PO TABS: 1.0000 mg | ORAL_TABLET | Freq: Every evening | ORAL | Status: DC | PRN

## 2012-09-02 NOTE — Telephone Encounter (Signed)
Faxed refill request received from pharmacy for ALPRAZOLAM Last filled by MD on 07/05/12, #30 X 1 Last seen on 05/23/12 Follow up 4 MONTHS Please advise refills.  Losartan ready per pharmacy as this was refilled by our office on 08/18/12.

## 2012-09-02 NOTE — Telephone Encounter (Signed)
RX faxed by [facility]

## 2012-10-27 ENCOUNTER — Ambulatory Visit: Payer: Medicare Other | Admitting: Family Medicine

## 2012-11-02 ENCOUNTER — Ambulatory Visit (INDEPENDENT_AMBULATORY_CARE_PROVIDER_SITE_OTHER): Payer: Medicare Other | Admitting: Family Medicine

## 2012-11-02 ENCOUNTER — Encounter: Payer: Self-pay | Admitting: Family Medicine

## 2012-11-02 ENCOUNTER — Ambulatory Visit (INDEPENDENT_AMBULATORY_CARE_PROVIDER_SITE_OTHER)
Admission: RE | Admit: 2012-11-02 | Discharge: 2012-11-02 | Disposition: A | Discharge status: Home / Self Care | Payer: Medicare Other | Source: Ambulatory Visit | Attending: Family Medicine | Admitting: Family Medicine

## 2012-11-02 VITALS — BP 117/61 | HR 71 | Temp 97.6°F | Ht 62.0 in | Wt 158.8 lb

## 2012-11-02 DIAGNOSIS — M25579 Pain in unspecified ankle and joints of unspecified foot: Secondary | ICD-10-CM

## 2012-11-02 DIAGNOSIS — M25572 Pain in left ankle and joints of left foot: Secondary | ICD-10-CM

## 2012-11-02 DIAGNOSIS — I10 Essential (primary) hypertension: Secondary | ICD-10-CM

## 2012-11-02 HISTORY — DX: Pain in unspecified ankle and joints of unspecified foot: M25.579

## 2012-11-02 NOTE — Assessment & Plan Note (Signed)
Well controlled no changes today 

## 2012-11-02 NOTE — Patient Instructions (Addendum)
Ankle Sprain  An ankle sprain is an injury to the strong, fibrous tissues (ligaments) that hold the bones of your ankle joint together.   CAUSES  An ankle sprain is usually caused by a fall or by twisting your ankle. Ankle sprains most commonly occur when you step on the outer edge of your foot, and your ankle turns inward. People who participate in sports are more prone to these types of injuries.   SYMPTOMS    Pain in your ankle. The pain may be present at rest or only when you are trying to stand or walk.   Swelling.   Bruising. Bruising may develop immediately or within 1 to 2 days after your injury.   Difficulty standing or walking, particularly when turning corners or changing directions.  DIAGNOSIS   Your caregiver will ask you details about your injury and perform a physical exam of your ankle to determine if you have an ankle sprain. During the physical exam, your caregiver will press on and apply pressure to specific areas of your foot and ankle. Your caregiver will try to move your ankle in certain ways. An X-ray exam may be done to be sure a bone was not broken or a ligament did not separate from one of the bones in your ankle (avulsion fracture).   TREATMENT   Certain types of braces can help stabilize your ankle. Your caregiver can make a recommendation for this. Your caregiver may recommend the use of medicine for pain. If your sprain is severe, your caregiver may refer you to a surgeon who helps to restore function to parts of your skeletal system (orthopedist) or a physical therapist.  HOME CARE INSTRUCTIONS    Apply ice to your injury for 1 to 2 days or as directed by your caregiver. Applying ice helps to reduce inflammation and pain.   Put ice in a plastic bag.   Place a towel between your skin and the bag.   Leave the ice on for 15 to 20 minutes at a time, every 2 hours while you are awake.   Only take over-the-counter or prescription medicines for pain, discomfort, or fever as directed  by your caregiver.   Keep your injured leg elevated, when possible, to lessen swelling.   If your caregiver recommends crutches, use them as instructed. Gradually put weight on the affected ankle. Continue to use crutches or a cane until you can walk without feeling pain in your ankle.   If you have a plaster splint, wear the splint as directed by your caregiver. Do not rest it on anything harder than a pillow for the first 24 hours. Do not put weight on it. Do not get it wet. You may take it off to take a shower or bath.   You may have been given an elastic bandage to wear around your ankle to provide support. If the elastic bandage is too tight (you have numbness or tingling in your foot or your foot becomes cold and blue), adjust the bandage to make it comfortable.   If you have an air splint, you may blow more air into it or let air out to make it more comfortable. You may take your splint off at night and before taking a shower or bath.   Wiggle your toes in the splint several times per day to decrease swelling.  SEEK MEDICAL CARE IF:    You have an increase in bruising, swelling, or pain.   Your toes feel extremely cold   or you lose feeling in your foot.   Your pain is not relieved with medicine.  SEEK IMMEDIATE MEDICAL CARE IF:   Your toes are numb or blue.   You have severe pain.  MAKE SURE YOU:    Understand these instructions.   Will watch your condition.   Will get help right away if you are not doing well or get worse.  Document Released: 08/03/2005 Document Revised: 10/26/2011 Document Reviewed: 08/15/2011  [employer] Patient Information 2013 [employer]

## 2012-11-02 NOTE — Assessment & Plan Note (Signed)
Has been painful with weight baring since she caught it on a fence over this past summer. Never had any swelling or ecchymosis at that time. Some days more painful than others. Never unable to ambulate, does not keep her up at night. Xray unremarkable. Encouraged to try Aspercreme as needed and if pain persists can consult with orthopaedics

## 2012-11-02 NOTE — Progress Notes (Signed)
Patient ID: Colleen Wright, female   DOB: 07/01/1941, 72 y.o.   MRN: [id number] Colleen Wright [id number] 26-Dec-1940 11/02/2012      Progress Note-Follow Up  Subjective  Chief Complaint  Chief Complaint  Patient presents with  . Ankle Pain    requesting xray- left - last summer got tangled up in fence- still hurting    HPI  Patient is a 72 year old Caucasian female who is in today with persistent left heel pain she entered over the summer of 2013 stretching and on since. It never swelled it was ecchymotic. She never had a period, she could not walk. Some days are better than others but it hurts with weightbearing. No numbness tingling weakness swelling noted in the foot or leg. No chest pain, palpitations, shortness of breath, GI or GU complaints.  Past Medical History  Diagnosis Date  . Colon polyps   . Allergy   . Arthritis     low back  . Cancer     thyroid, s/p thyroidectomy 8 years ago  . Depression   . GERD (gastroesophageal reflux disease)   . Hyperlipidemia   . Hypertension   . Ulcer   . Hypothyroidism 06/12/2011  . Hiatal hernia   . CAD (coronary artery disease) 06/12/2011  . Back pain 06/13/2011  . Vertigo 06/13/2011  . Leg fracture   . Thyroid cancer   . Skin lesions, generalized 05/23/2012  . Pain of left heel 05/23/2012  . Pain in joint, ankle and foot 11/02/2012    Past Surgical History  Procedure Laterality Date  . Thyroidectomy, partial      twice  . Fracture surgery      left leg, pin and 5 bolts in place in lower tibia  . Tubal ligation      growth removed from posterior vaginal vault  . Lesion removal      vaginal    Family History  Problem Relation Age of Onset  . Cancer Mother     ovary/uterus-can't remember  . Stroke Mother   . Heart disease Father   . Heart disease Sister     bypass  . Stroke Sister   . Cancer Sister     LN, bone  . Hypertension Sister   . Heart disease Brother     bypass  . Cancer Cousin     breast    History    Social History  . Marital Status: Married    Spouse Name: N/A    Number of Children: N/A  . Years of Education: N/A   Occupational History  . Not on file.   Social History Main Topics  . Smoking status: Never Smoker   . Smokeless tobacco: Never Used  . Alcohol Use: No  . Drug Use: No  . Sexually Active: Not on file   Other Topics Concern  . Not on file   Social History Narrative  . No narrative on file    Current Outpatient Prescriptions on File Prior to Visit  Medication Sig Dispense Refill  . ALPRAZolam (XANAX) 1 MG tablet Take 1 tablet (1 mg total) by mouth at bedtime as needed.  30 tablet  1  . aspirin 325 MG tablet Take 325 mg by mouth daily. Patient used this medication for dizziness.      Marland Kitchen levothyroxine (SYNTHROID, LEVOTHROID) 112 MCG tablet Take 1 tablet (112 mcg total) by mouth daily.  30 tablet  5  . losartan-hydrochlorothiazide (HYZAAR) 100-12.5 MG per tablet Take 1 tablet by  mouth daily.  90 tablet  1  . nitroGLYCERIN (NITROSTAT) 0.4 MG SL tablet Place 1 tablet (0.4 mg total) under the tongue every 5 (five) minutes x 3 doses as needed for chest pain.  25 tablet  3   No current facility-administered medications on file prior to visit.    Allergies  Allergen Reactions  . Amoxicillin Other (See Comments)    Patient develops a very bad yeast infection.    Review of Systems  Review of Systems  Constitutional: Negative for fever and malaise/fatigue.  HENT: Negative for congestion.   Eyes: Negative for discharge.  Respiratory: Negative for shortness of breath.   Cardiovascular: Negative for chest pain, palpitations and leg swelling.  Gastrointestinal: Negative for nausea, abdominal pain and diarrhea.  Genitourinary: Negative for dysuria.  Musculoskeletal: Positive for joint pain. Negative for falls.       Left heel pain  Skin: Negative for rash.  Neurological: Negative for loss of consciousness and headaches.  Endo/Heme/Allergies: Negative for  polydipsia.  Psychiatric/Behavioral: Negative for depression and suicidal ideas. The patient is not nervous/anxious and does not have insomnia.     Objective  BP 117/61  Pulse 71  Temp(Src) 97.6 F (36.4 C) (Temporal)  Ht 5\' 2"  (1.575 m)  Wt 158 lb 12.8 oz (72.031 kg)  BMI 29.04 kg/m2  SpO2 100%  Physical Exam  Physical Exam  Constitutional: She is oriented to person, place, and time and well-developed, well-nourished, and in no distress. No distress.  HENT:  Head: Normocephalic and atraumatic.  Eyes: Conjunctivae are normal.  Neck: Neck supple. No thyromegaly present.  Cardiovascular: Normal rate, regular rhythm and normal heart sounds.   No murmur heard. Pulmonary/Chest: Effort normal and breath sounds normal. She has no wheezes.  Abdominal: She exhibits no distension and no mass.  Musculoskeletal: She exhibits no edema.  Lymphadenopathy:    She has no cervical adenopathy.  Neurological: She is alert and oriented to person, place, and time.  Skin: Skin is warm and dry. No rash noted. She is not diaphoretic.  Psychiatric: Memory, affect and judgment normal.    Lab Results  Component Value Date   TSH 4.044 02/07/2012   Lab Results  Component Value Date   WBC 6.7 02/07/2012   HGB 12.0 02/07/2012   HCT 35.6* 02/07/2012   MCV 89.0 02/07/2012   PLT 205 02/07/2012   Lab Results  Component Value Date   CREATININE 1.06 02/07/2012   BUN 18 02/07/2012   NA 139 02/07/2012   K 3.5 02/07/2012   CL 102 02/07/2012   CO2 26 02/07/2012   Lab Results  Component Value Date   ALT 12 02/07/2012   AST 16 02/07/2012   ALKPHOS 74 02/07/2012   BILITOT 0.2* 02/07/2012   Lab Results  Component Value Date   CHOL 206* 02/08/2012   Lab Results  Component Value Date   HDL 43 02/08/2012   Lab Results  Component Value Date   LDLCALC 127* 02/08/2012   Lab Results  Component Value Date   TRIG 182* 02/08/2012   Lab Results  Component Value Date   CHOLHDL 4.8 02/08/2012     Assessment &  Plan  HTN (hypertension) Well controlled no changes today  Pain in joint, ankle and foot Has been painful with weight baring since she caught it on a fence over this past summer. Never had any swelling or ecchymosis at that time. Some days more painful than others. Never unable to ambulate, does not keep her up  at night. Xray unremarkable. Encouraged to try Aspercreme as needed and if pain persists can consult with orthopaedics

## 2012-11-03 NOTE — Progress Notes (Signed)
Quick Note:  Patient Informed and voiced understanding ______ 

## 2012-12-01 DIAGNOSIS — H524 Presbyopia: Secondary | ICD-10-CM | POA: Diagnosis not present

## 2012-12-01 DIAGNOSIS — H2510 Age-related nuclear cataract, unspecified eye: Secondary | ICD-10-CM | POA: Diagnosis not present

## 2012-12-01 DIAGNOSIS — H43819 Vitreous degeneration, unspecified eye: Secondary | ICD-10-CM | POA: Diagnosis not present

## 2012-12-01 DIAGNOSIS — H5210 Myopia, unspecified eye: Secondary | ICD-10-CM | POA: Diagnosis not present

## 2012-12-09 ENCOUNTER — Telehealth: Payer: Self-pay

## 2012-12-09 NOTE — Telephone Encounter (Signed)
OK 

## 2012-12-09 NOTE — Telephone Encounter (Signed)
Returned pt called about her ankle pain, pt stated that she is still having pain and that she would like a cortisone injection into her ankle. Pt stated that she has a daughter in law that works at a orthopedic office and that she would just go to that office instead of having a referral.

## 2012-12-21 ENCOUNTER — Telehealth: Payer: Self-pay | Admitting: Family Medicine

## 2012-12-21 NOTE — Telephone Encounter (Signed)
Refill- alprazolam 1mg  tablets. Take one tablet by mouth every night at bedtime as needed. Qty 30 last fill 3.22.14

## 2012-12-22 MED ORDER — ALPRAZOLAM 1 MG PO TABS: 1.0000 mg | ORAL_TABLET | Freq: Every evening | ORAL | Status: DC | PRN

## 2012-12-22 NOTE — Addendum Note (Signed)
Addended by: Court Joy on: 12/22/2012 03:52 PM   Modules accepted: Orders

## 2012-12-22 NOTE — Telephone Encounter (Signed)
Request for refill on Xanax  Last filled - 09/02/12 #30 x1 Last seen on - 10/15/12 Follow up - None Please advise?

## 2012-12-22 NOTE — Telephone Encounter (Signed)
OK to refill Alprazolam same sig, #30, 1 rf

## 2013-01-31 ENCOUNTER — Telehealth: Payer: Self-pay

## 2013-01-31 NOTE — Telephone Encounter (Signed)
Pt left a vm stating that she would like to proceed with the neurological referral.  Please advise?

## 2013-01-31 NOTE — Telephone Encounter (Signed)
For her foot pain? Any numbness now or tingling? I have not seen her in 3 months so I need to know more about what is going on now

## 2013-02-01 NOTE — Telephone Encounter (Signed)
Legs so weak, dizzy. All the same things that were going on before. Fingernails numb under nailbed.

## 2013-02-01 NOTE — Telephone Encounter (Signed)
Patient informed and voiced understanding.   Pt stated she would call the OR office and see someone there.

## 2013-02-01 NOTE — Telephone Encounter (Signed)
So I have not seen her for 3 months and she has not done labs in a year. At her age I need to see her before I refer her, will run labs to check thyroid, urine, anemia etc while she is here

## 2013-02-02 ENCOUNTER — Telehealth: Payer: Self-pay | Admitting: Family Medicine

## 2013-02-02 MED ORDER — LEVOTHYROXINE SODIUM 112 MCG PO TABS: 112.0000 ug | ORAL_TABLET | Freq: Every day | ORAL | Status: DC

## 2013-02-02 NOTE — Telephone Encounter (Signed)
Refill-levothyroxine 0.112mg ( ) tab. Take one tablet by mouth every day. Qty 90 last fill 4.4.14

## 2013-02-20 ENCOUNTER — Telehealth: Payer: Self-pay | Admitting: Family Medicine

## 2013-02-20 MED ORDER — LOSARTAN POTASSIUM-HCTZ 100-12.5 MG PO TABS: 1.0000 | ORAL_TABLET | Freq: Every day | ORAL | Status: DC

## 2013-02-20 NOTE — Telephone Encounter (Signed)
Refill- losartan hctz 100/12.5mg  tablets. Take one tablet by mouth every day. Qty 90 last fill 4.4.14

## 2013-03-17 ENCOUNTER — Telehealth: Payer: Self-pay | Admitting: *Deleted

## 2013-03-17 MED ORDER — ALPRAZOLAM 1 MG PO TABS: 1.0000 mg | ORAL_TABLET | Freq: Every evening | ORAL | Status: DC | PRN

## 2013-03-17 NOTE — Telephone Encounter (Signed)
Faxed refill request received from pharmacy for Alprazolam Last filled by MD on 05.08.14, #[id number] Last AEX - 03.19.14 Next AEX - PRN Please Advise/SLS

## 2013-03-17 NOTE — Telephone Encounter (Signed)
Rx request phoned to pharmacy/SLS  

## 2013-03-17 NOTE — Telephone Encounter (Signed)
OK to refill Alprazolam with same strength, same sig, #30 with 1 rf

## 2013-03-24 ENCOUNTER — Encounter: Payer: Self-pay | Admitting: Nurse Practitioner

## 2013-03-24 ENCOUNTER — Ambulatory Visit (INDEPENDENT_AMBULATORY_CARE_PROVIDER_SITE_OTHER): Payer: Medicare Other | Admitting: Nurse Practitioner

## 2013-03-24 ENCOUNTER — Other Ambulatory Visit: Payer: Self-pay | Admitting: Nurse Practitioner

## 2013-03-24 VITALS — BP 120/60 | HR 73 | Temp 97.9°F | Ht 62.0 in | Wt 154.0 lb

## 2013-03-24 DIAGNOSIS — Z0000 Encounter for general adult medical examination without abnormal findings: Secondary | ICD-10-CM | POA: Diagnosis not present

## 2013-03-24 DIAGNOSIS — Z1321 Encounter for screening for nutritional disorder: Secondary | ICD-10-CM

## 2013-03-24 DIAGNOSIS — R200 Anesthesia of skin: Secondary | ICD-10-CM

## 2013-03-24 DIAGNOSIS — K148 Other diseases of tongue: Secondary | ICD-10-CM | POA: Diagnosis not present

## 2013-03-24 DIAGNOSIS — I10 Essential (primary) hypertension: Secondary | ICD-10-CM

## 2013-03-24 DIAGNOSIS — R42 Dizziness and giddiness: Secondary | ICD-10-CM

## 2013-03-24 DIAGNOSIS — Z1329 Encounter for screening for other suspected endocrine disorder: Secondary | ICD-10-CM | POA: Diagnosis not present

## 2013-03-24 DIAGNOSIS — R209 Unspecified disturbances of skin sensation: Secondary | ICD-10-CM

## 2013-03-24 DIAGNOSIS — E039 Hypothyroidism, unspecified: Secondary | ICD-10-CM

## 2013-03-24 DIAGNOSIS — R29898 Other symptoms and signs involving the musculoskeletal system: Secondary | ICD-10-CM | POA: Diagnosis not present

## 2013-03-24 DIAGNOSIS — Z130 Encounter for screening for diseases of the blood and blood-forming organs and certain disorders involving the immune mechanism: Secondary | ICD-10-CM

## 2013-03-24 DIAGNOSIS — E785 Hyperlipidemia, unspecified: Secondary | ICD-10-CM

## 2013-03-24 DIAGNOSIS — IMO0002 Reserved for concepts with insufficient information to code with codable children: Secondary | ICD-10-CM

## 2013-03-24 DIAGNOSIS — Z13228 Encounter for screening for other metabolic disorders: Secondary | ICD-10-CM

## 2013-03-24 MED ORDER — LOSARTAN POTASSIUM-HCTZ 50-12.5 MG PO TABS
1.0000 | ORAL_TABLET | Freq: Every day | ORAL | Status: DC
Start: 2013-03-24 — End: 2013-04-11

## 2013-03-24 MED ORDER — MELOXICAM 7.5 MG PO TABS: 7.5000 mg | ORAL_TABLET | Freq: Every day | ORAL | Status: DC

## 2013-03-24 NOTE — Patient Instructions (Addendum)

## 2013-03-24 NOTE — Progress Notes (Signed)
Subjective:     Colleen Wright is a 72 y.o. female who presents for follow up of hypothyroidism. Additionally, she c/o bilateral hand & arm numbness that has been going on for about 4 months. She also c/o legs "giving out" and mentions that she was having "dizzy spells" 1 year ago. Regarding hypothyroidism (thyrectomy due to thyroid cancer), she is taking synthroid. Current symptoms: none. Patient denies diarrhea, heat / cold intolerance, nervousness and palpitations. Symptoms have been well-controlled and essentially resolved. Regarding hand & arm numbeness and leg weakness, she has significant kyphosis and previous thoracic & lumbar xrays show degenerative disc disease and spinal stenosis.  She continues to c/o L ankle pain since she fell 1 year ago. Past xray reveals hardware in distal tib/fib and calcaneal spur.  The following portions of the patient's history were reviewed and updated as appropriate: allergies, current medications, past family history, past medical history, past social history, past surgical history and problem list.  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 Gastrointestinal: negative for abdominal pain, diarrhea and reflux symptoms Musculoskeletal:positive for arthralgias, back pain and muscle weakness, negative for myalgias, neck pain and stiff joints Neurological: positive for dizziness and weakness, negative for coordination problems, headaches, seizures and tremors Endocrine: negative for diabetic symptoms including polydipsia, polyphagia and polyuria and temperature intolerance     Objective:    BP 120/60  Pulse 73  Temp(Src) 97.9 F (36.6 C) (Oral)  Ht 5\' 2"  (1.575 m)  Wt 154 lb (69.854 kg)  BMI 28.16 kg/m2  SpO2 97% General appearance: alert, cooperative, appears stated age and no distress Head: Normocephalic, without obvious abnormality, atraumatic Eyes: negative findings: lids and lashes normal, conjunctivae and sclerae normal, corneas clear and pupils equal, round, reactive to light and accomodation Throat: abnormal findings: fissured tongue and smooth tongue, loss of papillae Neck: no adenopathy, no carotid bruit, supple, symmetrical, trachea midline and scar from thyrectomy Back: no tenderness to percussion or palpation, kyphosis Lungs: clear to auscultation bilaterally Heart: regular rate and rhythm, S1, S2 normal, no murmur, click, rub or gallop Extremities: extremities normal, atraumatic, no cyanosis or edema Pulses: 2+ and symmetric Lymph nodes: Cervical, supraclavicular, and axillary nodes normal. Neurologic: Grossly normal  Laboratory: Lab Results  Component Value Date   TSH 4.044 02/07/2012      Assessment:  1 Hypothyroidism.  Replacement synthroid 2 HTN well controlled, taking hyzaar, reports remote episodes of dizziness 3 smooth tongue DD: b12 deficiency 4 hand numbness DD: b12 neuropathy, DDD w/radiculopathy 5 leg weakness DD: spinal stenosis , muscle atrophy 6 ankle pain     Plan:    1. L-thyroxine per orders. Recheck thyroid function tests today. 2. Decrease Hyzaar dose, recheck BP in 2 weeks in office. Pt will monitor at home, instructed to call if over 140/90 3. Labs: CBC, B12 today 4-6 refer to ortho for eval & treat. Prescribe meloxicam PRN   4. Follow up in 6 months.

## 2013-03-25 LAB — URINALYSIS, ROUTINE W REFLEX MICROSCOPIC
Bilirubin Urine: NEGATIVE
Hgb urine dipstick: NEGATIVE
Ketones, ur: NEGATIVE mg/dL
Nitrite: NEGATIVE
Protein, ur: NEGATIVE mg/dL
Specific Gravity, Urine: 1.021 (ref 1.005–1.030)
Urobilinogen, UA: 0.2 mg/dL (ref 0.0–1.0)

## 2013-03-25 LAB — CBC
MCH: 30.4 pg (ref 26.0–34.0)
MCHC: 33.2 g/dL (ref 30.0–36.0)
MCV: 91.5 fL (ref 78.0–100.0)
Platelets: 225 10*3/uL (ref 150–400)
RDW: 13.7 % (ref 11.5–15.5)

## 2013-03-25 LAB — HEPATIC FUNCTION PANEL
ALT: 12 U/L (ref 0–35)
AST: 21 U/L (ref 0–37)
Bilirubin, Direct: 0.1 mg/dL (ref 0.0–0.3)
Indirect Bilirubin: 0.4 mg/dL (ref 0.0–0.9)
Total Bilirubin: 0.5 mg/dL (ref 0.3–1.2)

## 2013-03-25 LAB — RENAL FUNCTION PANEL
BUN: 25 mg/dL — ABNORMAL HIGH (ref 6–23)
Calcium: 9.8 mg/dL (ref 8.4–10.5)
Chloride: 101 mEq/L (ref 96–112)
Creat: 1.08 mg/dL (ref 0.50–1.10)
Phosphorus: 3.3 mg/dL (ref 2.3–4.6)
Potassium: 4.1 mEq/L (ref 3.5–5.3)

## 2013-03-25 LAB — TSH: TSH: 5.205 u[IU]/mL — ABNORMAL HIGH (ref 0.350–4.500)

## 2013-03-25 LAB — VITAMIN B12: Vitamin B-12: 138 pg/mL — ABNORMAL LOW (ref 211–911)

## 2013-03-25 LAB — LIPID PANEL
Cholesterol: 228 mg/dL — ABNORMAL HIGH (ref 0–200)
LDL Cholesterol: 157 mg/dL — ABNORMAL HIGH (ref 0–99)
Total CHOL/HDL Ratio: 4.5 Ratio
Triglycerides: 99 mg/dL (ref <150)
VLDL: 20 mg/dL (ref 0–40)

## 2013-03-27 ENCOUNTER — Encounter: Payer: Self-pay | Admitting: Emergency Medicine

## 2013-03-27 ENCOUNTER — Telehealth: Payer: Self-pay | Admitting: Nurse Practitioner

## 2013-03-27 ENCOUNTER — Ambulatory Visit (INDEPENDENT_AMBULATORY_CARE_PROVIDER_SITE_OTHER): Payer: Medicare Other | Admitting: Family Medicine

## 2013-03-27 DIAGNOSIS — E039 Hypothyroidism, unspecified: Secondary | ICD-10-CM

## 2013-03-27 DIAGNOSIS — E538 Deficiency of other specified B group vitamins: Secondary | ICD-10-CM | POA: Diagnosis not present

## 2013-03-27 MED ORDER — CYANOCOBALAMIN 1000 MCG/ML IJ SOLN
1000.0000 ug | Freq: Once | INTRAMUSCULAR | Status: AC
  Administered 2013-03-27: 1000 ug via INTRAMUSCULAR

## 2013-03-27 MED ORDER — LEVOTHYROXINE SODIUM 125 MCG PO TABS: 125.0000 ug | ORAL_TABLET | Freq: Every day | ORAL | Status: DC

## 2013-03-27 NOTE — Telephone Encounter (Signed)
Cholseterol slightly elevated. Pt does not want to take cholesterol medicine. She will consider taking fish oil and will try to cut back on carbs, will increase fiber intake through 5 servings of fruit & /or veges daily. TSH elevated-will increase levothyroxine & recheck TSH in 6 weeks. B12 low, may be cause for numbness & parastheisa in hands- will start B12 IM injections: qd for 5 days, then switch to oral supplement qd. Will recheck CBC in 6 weeks. Slight anemia, likley r/t b12 deficiency.  Discussed all with pt. Answered all questions.

## 2013-03-28 ENCOUNTER — Ambulatory Visit (INDEPENDENT_AMBULATORY_CARE_PROVIDER_SITE_OTHER): Payer: Medicare Other | Admitting: *Deleted

## 2013-03-28 DIAGNOSIS — E538 Deficiency of other specified B group vitamins: Secondary | ICD-10-CM | POA: Diagnosis not present

## 2013-03-28 MED ORDER — CYANOCOBALAMIN 1000 MCG/ML IJ SOLN
1000.0000 ug | Freq: Once | INTRAMUSCULAR | Status: AC
  Administered 2013-03-28: 1000 ug via INTRAMUSCULAR

## 2013-03-29 ENCOUNTER — Ambulatory Visit (INDEPENDENT_AMBULATORY_CARE_PROVIDER_SITE_OTHER): Payer: Medicare Other | Admitting: *Deleted

## 2013-03-29 DIAGNOSIS — E538 Deficiency of other specified B group vitamins: Secondary | ICD-10-CM | POA: Diagnosis not present

## 2013-03-29 MED ORDER — CYANOCOBALAMIN 1000 MCG/ML IJ SOLN
1000.0000 ug | Freq: Once | INTRAMUSCULAR | Status: AC
  Administered 2013-03-29: 1000 ug via INTRAMUSCULAR

## 2013-03-30 ENCOUNTER — Ambulatory Visit (INDEPENDENT_AMBULATORY_CARE_PROVIDER_SITE_OTHER): Payer: Medicare Other | Admitting: *Deleted

## 2013-03-30 DIAGNOSIS — E538 Deficiency of other specified B group vitamins: Secondary | ICD-10-CM | POA: Diagnosis not present

## 2013-03-30 MED ORDER — CYANOCOBALAMIN 1000 MCG/ML IJ SOLN
1000.0000 ug | Freq: Once | INTRAMUSCULAR | Status: AC
  Administered 2013-03-30: 1000 ug via INTRAMUSCULAR

## 2013-03-31 ENCOUNTER — Ambulatory Visit (INDEPENDENT_AMBULATORY_CARE_PROVIDER_SITE_OTHER): Payer: Medicare Other | Admitting: *Deleted

## 2013-03-31 DIAGNOSIS — E538 Deficiency of other specified B group vitamins: Secondary | ICD-10-CM | POA: Diagnosis not present

## 2013-03-31 MED ORDER — CYANOCOBALAMIN 1000 MCG/ML IJ SOLN
1000.0000 ug | Freq: Once | INTRAMUSCULAR | Status: AC
  Administered 2013-03-31: 1000 ug via INTRAMUSCULAR

## 2013-04-11 ENCOUNTER — Other Ambulatory Visit: Payer: Self-pay | Admitting: *Deleted

## 2013-04-11 DIAGNOSIS — I10 Essential (primary) hypertension: Secondary | ICD-10-CM

## 2013-04-11 MED ORDER — LOSARTAN POTASSIUM-HCTZ 50-12.5 MG PO TABS: 1.0000 | ORAL_TABLET | Freq: Every day | ORAL | Status: DC

## 2013-04-11 NOTE — Telephone Encounter (Signed)
Please order 90 T, 1 refill

## 2013-04-11 NOTE — Telephone Encounter (Signed)
Please advise refill? Do you want to give #30 with x amount of refills?

## 2013-04-20 ENCOUNTER — Telehealth: Payer: Self-pay | Admitting: *Deleted

## 2013-04-20 DIAGNOSIS — Z23 Encounter for immunization: Secondary | ICD-10-CM | POA: Diagnosis not present

## 2013-04-20 NOTE — Telephone Encounter (Signed)
Patient left vm requesting refill for BP medication. Returned call, patient stated that she has already called pharmacy for refills.

## 2013-05-15 ENCOUNTER — Other Ambulatory Visit: Payer: Self-pay | Admitting: *Deleted

## 2013-05-15 DIAGNOSIS — E039 Hypothyroidism, unspecified: Secondary | ICD-10-CM

## 2013-05-15 NOTE — Telephone Encounter (Signed)
LMOVM for patient to return call concerning lab appointment to check TSH.

## 2013-05-15 NOTE — Telephone Encounter (Signed)
Pt needs TSH as synthroid adjusted last 6 weeks ago.

## 2013-05-17 ENCOUNTER — Other Ambulatory Visit: Payer: Self-pay | Admitting: *Deleted

## 2013-05-17 DIAGNOSIS — E039 Hypothyroidism, unspecified: Secondary | ICD-10-CM

## 2013-05-18 NOTE — Telephone Encounter (Signed)
Patient notified

## 2013-05-19 ENCOUNTER — Ambulatory Visit (INDEPENDENT_AMBULATORY_CARE_PROVIDER_SITE_OTHER): Payer: Medicare Other | Admitting: *Deleted

## 2013-05-19 ENCOUNTER — Other Ambulatory Visit (INDEPENDENT_AMBULATORY_CARE_PROVIDER_SITE_OTHER): Payer: Medicare Other

## 2013-05-19 VITALS — BP 110/60

## 2013-05-19 DIAGNOSIS — E039 Hypothyroidism, unspecified: Secondary | ICD-10-CM | POA: Diagnosis not present

## 2013-05-19 DIAGNOSIS — I10 Essential (primary) hypertension: Secondary | ICD-10-CM

## 2013-05-19 LAB — MEASURE BLOOD PRESSURE

## 2013-05-19 NOTE — Progress Notes (Signed)
Labs only

## 2013-05-22 ENCOUNTER — Telehealth: Payer: Self-pay | Admitting: Nurse Practitioner

## 2013-05-22 ENCOUNTER — Telehealth: Payer: Self-pay | Admitting: *Deleted

## 2013-05-22 DIAGNOSIS — E039 Hypothyroidism, unspecified: Secondary | ICD-10-CM

## 2013-05-22 MED ORDER — LEVOTHYROXINE SODIUM 125 MCG PO TABS: 125.0000 ug | ORAL_TABLET | Freq: Every day | ORAL | Status: DC

## 2013-05-22 NOTE — Telephone Encounter (Signed)
Patient request info on her b-12 results.

## 2013-05-22 NOTE — Telephone Encounter (Signed)
Patient has been advised that TSH is normal. Patient has an appt 09/25/13. Patient asked if her B12 results came back yet. Please advise.

## 2013-05-22 NOTE — Addendum Note (Signed)
Addended by: Marlene Lard on: 05/22/2013 05:04 PM   Modules accepted: Orders

## 2013-05-22 NOTE — Telephone Encounter (Signed)
TSH is in normal range. Will continue at current dose. Ofc visit in 3 mos & recheck TSH.

## 2013-05-30 NOTE — Telephone Encounter (Signed)
Patient contacted

## 2013-05-30 NOTE — Telephone Encounter (Signed)
complete

## 2013-08-21 ENCOUNTER — Other Ambulatory Visit: Payer: Self-pay | Admitting: *Deleted

## 2013-08-21 DIAGNOSIS — E039 Hypothyroidism, unspecified: Secondary | ICD-10-CM

## 2013-08-21 MED ORDER — ALPRAZOLAM 1 MG PO TABS: 1.0000 mg | ORAL_TABLET | Freq: Every evening | ORAL | Status: DC | PRN

## 2013-08-21 MED ORDER — LEVOTHYROXINE SODIUM 125 MCG PO TABS: 125.0000 ug | ORAL_TABLET | Freq: Every day | ORAL | Status: DC

## 2013-08-21 NOTE — Telephone Encounter (Signed)
Refill request for Xanax Last filled by MD on - 03/17/13 #30 x1 Last Appt- 03/24/13 Next Appt- 09/25/13 Please advise refill? Patient should have 0 pills left.

## 2013-08-23 ENCOUNTER — Other Ambulatory Visit: Payer: Self-pay | Admitting: *Deleted

## 2013-08-23 DIAGNOSIS — I10 Essential (primary) hypertension: Secondary | ICD-10-CM

## 2013-08-23 MED ORDER — LOSARTAN POTASSIUM-HCTZ 50-12.5 MG PO TABS
1.0000 | ORAL_TABLET | Freq: Every day | ORAL | Status: DC
Start: 2013-08-23 — End: 2014-03-08

## 2013-09-15 ENCOUNTER — Other Ambulatory Visit: Payer: Self-pay | Admitting: Nurse Practitioner

## 2013-09-15 ENCOUNTER — Telehealth: Payer: Self-pay | Admitting: *Deleted

## 2013-09-15 DIAGNOSIS — R42 Dizziness and giddiness: Secondary | ICD-10-CM

## 2013-09-15 MED ORDER — MECLIZINE HCL 25 MG PO TABS: 25.0000 mg | ORAL_TABLET | Freq: Two times a day (BID) | ORAL | Status: DC | PRN

## 2013-09-15 NOTE — Telephone Encounter (Signed)
[facility] faxed request for Meclizine 25 mg RX tablets. Take 1 tab po tid daily prn for dizziness/nausea. Do you want to fill this?

## 2013-09-15 NOTE — Telephone Encounter (Signed)
i filled prescription.

## 2013-09-25 ENCOUNTER — Ambulatory Visit (INDEPENDENT_AMBULATORY_CARE_PROVIDER_SITE_OTHER): Payer: Medicare Other | Admitting: Nurse Practitioner

## 2013-09-25 ENCOUNTER — Ambulatory Visit: Payer: Medicare Other | Admitting: Nurse Practitioner

## 2013-09-25 VITALS — BP 142/76 | HR 70 | Temp 98.4°F | Resp 16 | Ht 62.0 in | Wt 152.0 lb

## 2013-09-25 DIAGNOSIS — R42 Dizziness and giddiness: Secondary | ICD-10-CM

## 2013-09-25 DIAGNOSIS — E538 Deficiency of other specified B group vitamins: Secondary | ICD-10-CM

## 2013-09-25 DIAGNOSIS — I10 Essential (primary) hypertension: Secondary | ICD-10-CM | POA: Diagnosis not present

## 2013-09-25 DIAGNOSIS — E039 Hypothyroidism, unspecified: Secondary | ICD-10-CM

## 2013-09-25 DIAGNOSIS — Z23 Encounter for immunization: Secondary | ICD-10-CM

## 2013-09-25 LAB — CBC
HCT: 36.8 % (ref 36.0–46.0)
Hemoglobin: 12.3 g/dL (ref 12.0–15.0)
MCHC: 33.3 g/dL (ref 30.0–36.0)
MCV: 88.5 fl (ref 78.0–100.0)
Platelets: 222 10*3/uL (ref 150.0–400.0)
RBC: 4.16 Mil/uL (ref 3.87–5.11)
RDW: 13.4 % (ref 11.5–14.6)
WBC: 6.8 10*3/uL (ref 4.5–10.5)

## 2013-09-25 LAB — TSH: TSH: 0.7 u[IU]/mL (ref 0.35–5.50)

## 2013-09-25 NOTE — Assessment & Plan Note (Signed)
TSH today. Continue at 125 mcg synthroid. No symptoms.

## 2013-09-25 NOTE — Progress Notes (Signed)
Subjective:     Colleen Wright is a 73 y.o. female. She presents for f/u of chronic conditions: HTN, hypothyroidism secondary thyroidectomy due to thyroid ca, and b12 deficiency. Re HTN: hyzaar was decreased from 100 to 50 at last visit. Pt reports fewer dizzy episodes.  Re thyroid disease: synthroid was increased from 112 mcg to 125 mcg at last visit. She reports more energy & no side effects. Re vitamin B deficiency, she received 6 weeks b12 injections, then oral maintenance dose. Reports more energy, still having tingling in hands.   The following portions of the patient's history were reviewed and updated as appropriate: allergies, current medications, past medical history, past social history, past surgical history and problem list.  Review of Systems Constitutional: negative for chills, fatigue, fevers and 2 lb weight loss. Respiratory: negative for cough Cardiovascular: negative for chest pressure/discomfort, lower extremity edema and palpitations Gastrointestinal: positive for sharp shooting pain occasionally Musculoskeletal:positive for hand tingling  Neurological: positive for paresthesia Behavioral/Psych: positive for anxiety, negative for excessive alcohol consumption, illegal drug usage and tobacco use Endocrine: negative for diabetic symptoms including polydipsia, polyphagia and polyuria and temperature intolerance    Objective:    BP 142/76  Pulse 70  Temp(Src) 98.4 F (36.9 C) (Temporal)  Resp 16  Ht 5\' 2"  (1.575 m)  Wt 152 lb (68.947 kg)  BMI 27.79 kg/m2  SpO2 97% BP 142/76  Pulse 70  Temp(Src) 98.4 F (36.9 C) (Temporal)  Resp 16  Ht 5\' 2"  (1.575 m)  Wt 152 lb (68.947 kg)  BMI 27.79 kg/m2  SpO2 97% General appearance: alert, cooperative, appears stated age and no distress Head: Normocephalic, without obvious abnormality, atraumatic Eyes: negative findings: lids and lashes normal and conjunctivae and sclerae normal Lungs: clear to auscultation  bilaterally Heart: regular rate and rhythm, S1, S2 normal, no murmur, click, rub or gallop Extremities: extremities normal, atraumatic, no cyanosis or edema Pulses: 2+ and symmetric    Assessment:    1 HTN good control. Goal 150/90. Fewer dizzy episodes with lower dose. 2 hypothyroid secondary to thyroidectomy no s&S of hypo or hyper thyroid  B12 deficiency, energy level improved since b12 injections & oral supplement Tingling hands, missed 3 ortho APPts., no improvement w/b12 & B complex.      Plan:     1 Continue hyzaar at 50/12.5. Urine microalb & cmet today. 2 Continue synthroid at 125 mcg. TSH today 3 b12 today. Continue oral supplement to get 1000 mcg b12 qd 4 See ortho. F/u 6 mos.

## 2013-09-25 NOTE — Assessment & Plan Note (Signed)
Goal of treatment is 150/90. Will continue losartan/HCTZ at 50/12.5 mg qd. Pt is having fewer dizzy episodes since decreasing dose. Urine microalb today, CMET today.

## 2013-09-25 NOTE — Progress Notes (Signed)
Pre visit review using our clinic review tool, if applicable. No additional management support is needed unless otherwise documented below in the visit note. 

## 2013-09-25 NOTE — Assessment & Plan Note (Signed)
Pt reports fewer episodes since getting B12 injections and lowering BP meds. Will continue b complex supplement & treat BP to <150/90. Uses meclizine when dizzy-cautioned to use only if she is unable to walk as antivert can contribute to falls in elderly.

## 2013-09-25 NOTE — Assessment & Plan Note (Signed)
Pt states energy has greatly improved. Still having tingling in hands. Tongue still appears smooth. Continue oral liquid b complex qd to get 1200 mcg b12. B12 level today. Will resume IM inj if level has not improved.

## 2013-09-25 NOTE — Patient Instructions (Signed)
Continue to take B12 supplement-you need 1200 mcg b12 daily. Our office will call you with lab results. See info below for fall prevention. Nice to see you!  Vitamin B12 Deficiency Not having enough vitamin B12 is called a deficiency. Vitamin B12 is an important vitamin. Your body needs vitamin B12 to:   Make red blood cells.  Make DNA. This is the genetic material inside all of your cells.  Help your nerves work properly so they can carry messages from your brain to your body. CAUSES  Not eating enough foods that contain vitamin B12.  Not having enough stomach acid and digestive juices. The body needs these to absorb vitamin B12 from the food you eat.  Having certain digestive system diseases that make it hard to absorb vitamin B12. These diseases include Crohn's disease, chronic pancreatitis, and cystic fibrosis.  Having pernicious anemia, which is a condition where the body has too few red blood cells. People with this condition do not make enough of a protein called "intrinsic factor," which is needed to absorb vitamin B12.  Having a surgery in which part of the stomach or small intestine is removed.  Taking certain medicines that make it hard for the body to absorb vitamin B12. These medicines include:  Heartburn medicine (antacids and proton pump inhibitors).  A certain antibiotic medicine called neomycin, which fights infection.  Some medicines used to treat diabetes, tuberculosis, gout, and high cholesterol. RISK FACTORS Risk factors are things that make you more likely to develop a vitamin B12 deficiency. They include:  Being older than 44.  Being a vegetarian.  Being pregnant and a vegetarian or having a poor diet.  Taking certain drugs.  Being an alcoholic. SYMPTOMS You may have a vitamin B12 deficiency with no symptoms. However, a vitamin B12 deficiency can cause health problems like anemia and nerve damage. These health problems can lead to many possible  symptoms, including:  Weakness.  Fatigue.  Loss of appetite.  Weight loss.  Numbness or tingling in your hands and feet.  Redness and burning of the tongue.  Confusion or memory problems.  Depression.  Dizziness.  Sensory problems, such as loss of taste, color blindness, and ringing in the ears.  Diarrhea or constipation.  Trouble walking. DIAGNOSIS Various types of tests can be given to help find the cause of your vitamin B12 deficiency. These tests include:  A complete blood count (CBC). This test gives your caregiver an overall picture of what makes up your blood.  A blood test to measure your B12 level.  A blood test to measure intrinsic factor.  An endoscopy. This procedure uses a thin tube with a camera on the end to look into your stomach or intestines. TREATMENT Treatment for vitamin B12 deficiency depends on what is causing it. Common options include:  Changing your eating and drinking habits, such as:  Eating more foods that contain vitamin B12.  Not drinking as much alcohol or any alcohol.  Taking vitamin B12 supplements. Your caregiver will tell you what dose is best for you.  Getting vitamin B12 injections. Some people get these a few times a week. Others get them once a month. HOME CARE INSTRUCTIONS  Take all supplements as directed by your caregiver. Follow the directions carefully.  Get any injections your caregiver prescribes. Do not miss your appointments.  Eat lots of healthy foods that contain vitamin B12. Ask your caregiver if you should work with a nutritionist. Good things to include in your diet are:  Meat.  Poultry.  Fish.  Eggs.  Fortified cereal and dairy products. This means vitamin B12 has been added to the food. Check the label on the package to be sure.  Do not abuse alcohol.  Keep all follow-up appointments. Your caregiver will need to perform blood tests to make sure your vitamin B12 deficiency is going away. SEEK  MEDICAL CARE IF:  You have any questions about your treatment.  Your symptoms come back. MAKE SURE YOU:  Understand these instructions.  Will watch your condition.  Will get help right away if you are not doing well or get worse. Document Released: 10/26/2011 Document Reviewed: 10/26/2011 [facility] Patient Information 2014 [place].  Fall Prevention and Home Safety Falls cause injuries and can affect all age groups. It is possible to use preventive measures to significantly decrease the likelihood of falls. There are many simple measures which can make your home safer and prevent falls. OUTDOORS  Repair cracks and edges of walkways and driveways.  Remove high doorway thresholds.  Trim shrubbery on the main path into your home.  Have good outside lighting.  Clear walkways of tools, rocks, debris, and clutter.  Check that handrails are not broken and are securely fastened. Both sides of steps should have handrails.  Have leaves, snow, and ice cleared regularly.  Use sand or salt on walkways during winter months.  In the garage, clean up grease or oil spills. BATHROOM  Install night lights.  Install grab bars by the toilet and in the tub and shower.  Use non-skid mats or decals in the tub or shower.  Place a plastic non-slip stool in the shower to sit on, if needed.  Keep floors dry and clean up all water on the floor immediately.  Remove soap buildup in the tub or shower on a regular basis.  Secure bath mats with non-slip, double-sided rug tape.  Remove throw rugs and tripping hazards from the floors. BEDROOMS  Install night lights.  Make sure a bedside light is easy to reach.  Do not use oversized bedding.  Keep a telephone by your bedside.  Have a firm chair with side arms to use for getting dressed.  Remove throw rugs and tripping hazards from the floor. KITCHEN  Keep handles on pots and pans turned toward the center of the stove. Use back  burners when possible.  Clean up spills quickly and allow time for drying.  Avoid walking on wet floors.  Avoid hot utensils and knives.  Position shelves so they are not too high or low.  Place commonly used objects within easy reach.  If necessary, use a sturdy step stool with a grab bar when reaching.  Keep electrical cables out of the way.  Do not use floor polish or wax that makes floors slippery. If you must use wax, use non-skid floor wax.  Remove throw rugs and tripping hazards from the floor. STAIRWAYS  Never leave objects on stairs.  Place handrails on both sides of stairways and use them. Fix any loose handrails. Make sure handrails on both sides of the stairways are as long as the stairs.  Check carpeting to make sure it is firmly attached along stairs. Make repairs to worn or loose carpet promptly.  Avoid placing throw rugs at the top or bottom of stairways, or properly secure the rug with carpet tape to prevent slippage. Get rid of throw rugs, if possible.  Have an electrician put in a light switch at the top and bottom  of the stairs. OTHER FALL PREVENTION TIPS  Wear low-heel or rubber-soled shoes that are supportive and fit well. Wear closed toe shoes.  When using a stepladder, make sure it is fully opened and both spreaders are firmly locked. Do not climb a closed stepladder.  Add color or contrast paint or tape to grab bars and handrails in your home. Place contrasting color strips on first and last steps.  Learn and use mobility aids as needed. Install an electrical emergency response system.  Turn on lights to avoid dark areas. Replace light bulbs that burn out immediately. Get light switches that glow.  Arrange furniture to create clear pathways. Keep furniture in the same place.  Firmly attach carpet with non-skid or double-sided tape.  Eliminate uneven floor surfaces.  Select a carpet pattern that does not visually hide the edge of steps.  Be  aware of all pets. OTHER HOME SAFETY TIPS  Set the water temperature for 120 F (48.8 C).  Keep emergency numbers on or near the telephone.  Keep smoke detectors on every level of the home and near sleeping areas. Document Released: 07/24/2002 Document Revised: 02/02/2012 Document Reviewed: 10/23/2011 [facility] Patient Information 2014 [place].

## 2013-09-26 LAB — COMPREHENSIVE METABOLIC PANEL
ALBUMIN: 3.8 g/dL (ref 3.5–5.2)
ALK PHOS: 67 U/L (ref 39–117)
ALT: 20 U/L (ref 0–35)
AST: 24 U/L (ref 0–37)
BUN: 22 mg/dL (ref 6–23)
CO2: 25 mEq/L (ref 19–32)
Calcium: 9.1 mg/dL (ref 8.4–10.5)
Chloride: 101 mEq/L (ref 96–112)
Creatinine, Ser: 1 mg/dL (ref 0.4–1.2)
GFR: 58.87 mL/min — ABNORMAL LOW (ref >60.00)
Glucose, Bld: 86 mg/dL (ref 70–99)
POTASSIUM: 3.9 meq/L (ref 3.5–5.1)
SODIUM: 136 meq/L (ref 135–145)
TOTAL PROTEIN: 7.4 g/dL (ref 6.0–8.3)
Total Bilirubin: 0.4 mg/dL (ref 0.3–1.2)

## 2013-09-26 LAB — VITAMIN B12: VITAMIN B 12: 1452 pg/mL — AB (ref 211–911)

## 2013-10-02 ENCOUNTER — Telehealth: Payer: Self-pay | Admitting: Nurse Practitioner

## 2013-10-02 NOTE — Telephone Encounter (Signed)
FYI
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
## 2013-10-31 ENCOUNTER — Telehealth: Payer: Self-pay | Admitting: Family Medicine

## 2013-10-31 NOTE — Telephone Encounter (Signed)
Patient requesting refill of alprazolam.  Last seen 09/25/13.  Last rx was printed 08/21/13 x 1 refill.   Please advise refill.

## 2013-10-31 NOTE — Telephone Encounter (Signed)
Ok to refill 

## 2013-11-01 MED ORDER — ALPRAZOLAM 1 MG PO TABS: 1.0000 mg | ORAL_TABLET | Freq: Every evening | ORAL | Status: DC | PRN

## 2013-11-01 NOTE — Telephone Encounter (Signed)
Rx faxed to pharmacy  

## 2013-11-07 ENCOUNTER — Other Ambulatory Visit: Payer: Self-pay | Admitting: *Deleted

## 2013-11-07 DIAGNOSIS — E039 Hypothyroidism, unspecified: Secondary | ICD-10-CM

## 2013-11-07 MED ORDER — LEVOTHYROXINE SODIUM 125 MCG PO TABS: 125.0000 ug | ORAL_TABLET | Freq: Every day | ORAL | Status: DC

## 2013-11-10 ENCOUNTER — Telehealth: Payer: Self-pay | Admitting: Nurse Practitioner

## 2013-11-10 DIAGNOSIS — N644 Mastodynia: Secondary | ICD-10-CM

## 2013-11-10 NOTE — Telephone Encounter (Signed)
Please advise 

## 2013-11-10 NOTE — Telephone Encounter (Signed)
Patient is having right breast tenderness. She is due for her mammo but [facility] is advising patient that they need to do a diagnostic instead of screening due to her symptoms. Please put in orders for diagnostic mammo. Patient states they have a 11/16/13 appt avail.

## 2013-11-13 NOTE — Telephone Encounter (Signed)
Patient notified

## 2013-11-16 DIAGNOSIS — Z1231 Encounter for screening mammogram for malignant neoplasm of breast: Secondary | ICD-10-CM | POA: Diagnosis not present

## 2013-11-22 ENCOUNTER — Telehealth: Payer: Self-pay | Admitting: Nurse Practitioner

## 2013-11-22 NOTE — Telephone Encounter (Signed)
Patient called in for mammo results. Results were normal. Patient advised.

## 2013-11-22 NOTE — Telephone Encounter (Signed)
Yes, reviewed result and it was normal.  I told Colleen Wright to tell pt result was normal and she needs another mammogram again in 1 yr.

## 2013-12-13 ENCOUNTER — Encounter: Payer: Self-pay | Admitting: Nurse Practitioner

## 2014-02-06 ENCOUNTER — Telehealth: Payer: Self-pay | Admitting: Family Medicine

## 2014-02-06 DIAGNOSIS — I10 Essential (primary) hypertension: Secondary | ICD-10-CM

## 2014-02-06 DIAGNOSIS — E039 Hypothyroidism, unspecified: Secondary | ICD-10-CM

## 2014-02-06 MED ORDER — ALPRAZOLAM 1 MG PO TABS: 1.0000 mg | ORAL_TABLET | Freq: Every evening | ORAL | Status: DC | PRN

## 2014-02-06 MED ORDER — LEVOTHYROXINE SODIUM 125 MCG PO TABS: 125.0000 ug | ORAL_TABLET | Freq: Every day | ORAL | Status: DC

## 2014-02-06 NOTE — Telephone Encounter (Signed)
Rx request for xanax.  Patient last saw Layne 09/25/13.  Next OV scheduled for 05/22/14.  Last RX was printed 11/01/13 for # 30 x 1 RF. Please advise.

## 2014-02-06 NOTE — Telephone Encounter (Signed)
rx printed

## 2014-02-07 NOTE — Telephone Encounter (Signed)
Rx faxed

## 2014-03-08 ENCOUNTER — Other Ambulatory Visit: Payer: Self-pay | Admitting: *Deleted

## 2014-03-08 DIAGNOSIS — I10 Essential (primary) hypertension: Secondary | ICD-10-CM

## 2014-03-08 MED ORDER — LOSARTAN POTASSIUM-HCTZ 50-12.5 MG PO TABS: 1.0000 | ORAL_TABLET | Freq: Every day | ORAL | Status: DC

## 2014-04-11 ENCOUNTER — Telehealth: Payer: Self-pay

## 2014-04-11 ENCOUNTER — Other Ambulatory Visit: Payer: Self-pay

## 2014-04-11 DIAGNOSIS — E039 Hypothyroidism, unspecified: Secondary | ICD-10-CM

## 2014-04-11 MED ORDER — ALPRAZOLAM 1 MG PO TABS: 1.0000 mg | ORAL_TABLET | Freq: Every evening | ORAL | Status: DC | PRN

## 2014-04-11 MED ORDER — LEVOTHYROXINE SODIUM 125 MCG PO TABS: 125.0000 ug | ORAL_TABLET | Freq: Every day | ORAL | Status: DC

## 2014-04-11 NOTE — Telephone Encounter (Signed)
[facility] faxed over a refill request for Alprazolam 1 mg tablets #30. Last office visit 09/25/2013. Pt has appt on 06/01/2014.

## 2014-04-11 NOTE — Telephone Encounter (Signed)
pls send as script refill.

## 2014-05-10 ENCOUNTER — Other Ambulatory Visit: Payer: Self-pay

## 2014-05-10 DIAGNOSIS — E039 Hypothyroidism, unspecified: Secondary | ICD-10-CM

## 2014-05-10 MED ORDER — LEVOTHYROXINE SODIUM 125 MCG PO TABS: 125.0000 ug | ORAL_TABLET | Freq: Every day | ORAL | Status: DC

## 2014-05-19 DIAGNOSIS — Z23 Encounter for immunization: Secondary | ICD-10-CM | POA: Diagnosis not present

## 2014-05-22 ENCOUNTER — Ambulatory Visit: Payer: Medicare Other | Admitting: Nurse Practitioner

## 2014-06-07 ENCOUNTER — Ambulatory Visit: Payer: Medicare Other | Admitting: Nurse Practitioner

## 2014-06-11 ENCOUNTER — Ambulatory Visit: Payer: Medicare Other | Admitting: Nurse Practitioner

## 2014-06-13 ENCOUNTER — Other Ambulatory Visit: Payer: Self-pay | Admitting: *Deleted

## 2014-06-13 MED ORDER — ALPRAZOLAM 1 MG PO TABS: 1.0000 mg | ORAL_TABLET | Freq: Every evening | ORAL | Status: DC | PRN

## 2014-06-13 NOTE — Telephone Encounter (Signed)
Refill request for a;prazolam Last filled by MD on- 09/25/13 #30 x1 Last Appt: 09/25/2013 Next Appt: 06/25/2014 Please advise refill?

## 2014-06-25 ENCOUNTER — Ambulatory Visit (INDEPENDENT_AMBULATORY_CARE_PROVIDER_SITE_OTHER): Payer: Medicare Other | Admitting: Nurse Practitioner

## 2014-06-25 ENCOUNTER — Encounter: Payer: Self-pay | Admitting: Nurse Practitioner

## 2014-06-25 ENCOUNTER — Ambulatory Visit: Payer: Medicare Other | Admitting: Nurse Practitioner

## 2014-06-25 VITALS — BP 107/67 | HR 81 | Temp 97.7°F | Resp 18 | Ht 62.0 in | Wt 147.0 lb

## 2014-06-25 DIAGNOSIS — M1990 Unspecified osteoarthritis, unspecified site: Secondary | ICD-10-CM | POA: Diagnosis not present

## 2014-06-25 DIAGNOSIS — E559 Vitamin D deficiency, unspecified: Secondary | ICD-10-CM | POA: Diagnosis not present

## 2014-06-25 DIAGNOSIS — E038 Other specified hypothyroidism: Secondary | ICD-10-CM

## 2014-06-25 DIAGNOSIS — I25119 Atherosclerotic heart disease of native coronary artery with unspecified angina pectoris: Secondary | ICD-10-CM

## 2014-06-25 DIAGNOSIS — M545 Low back pain: Secondary | ICD-10-CM | POA: Diagnosis not present

## 2014-06-25 DIAGNOSIS — E538 Deficiency of other specified B group vitamins: Secondary | ICD-10-CM | POA: Diagnosis not present

## 2014-06-25 DIAGNOSIS — I10 Essential (primary) hypertension: Secondary | ICD-10-CM

## 2014-06-25 LAB — COMPREHENSIVE METABOLIC PANEL
ALBUMIN: 3.4 g/dL — AB (ref 3.5–5.2)
ALK PHOS: 82 U/L (ref 39–117)
ALT: 19 U/L (ref 0–35)
AST: 19 U/L (ref 0–37)
BILIRUBIN TOTAL: 0.6 mg/dL (ref 0.2–1.2)
BUN: 22 mg/dL (ref 6–23)
CO2: 27 mEq/L (ref 19–32)
Calcium: 10 mg/dL (ref 8.4–10.5)
Chloride: 101 mEq/L (ref 96–112)
Creatinine, Ser: 1.1 mg/dL (ref 0.4–1.2)
GFR: 49.85 mL/min — ABNORMAL LOW (ref >60.00)
GLUCOSE: 88 mg/dL (ref 70–99)
POTASSIUM: 4.2 meq/L (ref 3.5–5.1)
Sodium: 139 mEq/L (ref 135–145)
Total Protein: 7.6 g/dL (ref 6.0–8.3)

## 2014-06-25 LAB — VITAMIN D 25 HYDROXY (VIT D DEFICIENCY, FRACTURES): VITD: 22.76 ng/mL — ABNORMAL LOW (ref 30.00–100.00)

## 2014-06-25 LAB — VITAMIN B12: VITAMIN B 12: 858 pg/mL (ref 211–911)

## 2014-06-25 LAB — MICROALBUMIN / CREATININE URINE RATIO
Creatinine,U: 166.8 mg/dL
Microalb Creat Ratio: 0.2 mg/g (ref 0.0–30.0)
Microalb, Ur: 0.4 mg/dL (ref 0.0–1.9)

## 2014-06-25 LAB — TSH: TSH: 0.35 u[IU]/mL (ref 0.35–4.50)

## 2014-06-25 NOTE — Assessment & Plan Note (Signed)
Fatigue improved w/B12 inj. Not taking oral B complex. Check level today.

## 2014-06-25 NOTE — Assessment & Plan Note (Signed)
No s&s of thyroid disease. Taking med w/out SE. TSH today.

## 2014-06-25 NOTE — Assessment & Plan Note (Addendum)
Pt reports occasional chest heaviness that lasts less than 1 minute. No associated symptoms. She has not taken NTG for symptoms. Last cardiac note dated 01/2012, states f/u PRN. Pt understands if chest heaviness is lasting 5 minutes, she should go to ER or call me for further f/u. She does not wish to be on statin due to intolerable SE. Continue daily ASA-get EC.

## 2014-06-25 NOTE — Assessment & Plan Note (Signed)
Occasionally takes meloxicam-few times/month with relief. Continue PRN.

## 2014-06-25 NOTE — Assessment & Plan Note (Signed)
BP well controlled. RF: age, past Hx CAD w/angioplasty, HTN States home pressures well controlled. No microalbuminuria. Decrease losartan to 25 mg. (Just got script filled-will cut in half). Pt to monitor BP & call if over 150/90.

## 2014-06-25 NOTE — Patient Instructions (Signed)
Decrease BP med to 1/2 tab daily. Check blood pressure at home once weekly at same time each day for 3 weeks. Call if over 150/90. When checking blood pressure, feet should be flat on floor, cuff at level of heart, sit for 5 minutes before taking reading.  My office will call with lab results.  Nice to see you!  Happy Holidays & I will see you in 6 months.

## 2014-06-25 NOTE — Progress Notes (Signed)
Subjective:     Colleen Wright is a 73 y.o. female presents for follow up of chronic conditions: HTN, hypothyroidism, B12 deficiency, arthritis pain.  Re HTN she is tolerating losartan w/out SE. RF: age, NSAIDS. BP well controlled today. She lost 5 lbs since last visit.  Re thyroid : she takes med daily w/no apparent symptoms of thyroid disease. Re B12 deficiency, fatigue resolved with IM injections B12 & has not returned. She took oral B complex for several mos, but stopped recently. Re arthritis pain, she occasionally takes meloxicam with relief. Pain is in back & hands, moderate.  The following portions of the patient's history were reviewed and updated as appropriate: allergies, current medications, past medical history, past social history, past surgical history and problem list.  Review of Systems Constitutional: negative for chills, fatigue and fevers Eyes: positive for contacts/glasses, only wears contacts when at church-she wants to see who is in choir. wears glasses for driving. Respiratory: negative for cough Cardiovascular: positive for occasional brief chest heaviness-lasts less than 1 minute. no associated symptoms. Integument/breast: negative, nml MMG Endocrine: negative for temperature intolerance   MSK: occasional bilateral LE weakness self-limiting, low back pain  Objective:    BP 107/67 mmHg  Pulse 81  Temp(Src) 97.7 F (36.5 C) (Oral)  Resp 18  Ht 5\' 2"  (0.932 m)  Wt 147 lb (66.679 kg)  BMI 26.88 kg/m2  SpO2 100% BP 107/67 mmHg  Pulse 81  Temp(Src) 97.7 F (36.5 C) (Oral)  Resp 18  Ht 5\' 2"  (1.575 m)  Wt 147 lb (66.679 kg)  BMI 26.88 kg/m2  SpO2 100% General appearance: alert, cooperative, appears stated age and no distress Head: Normocephalic, without obvious abnormality, atraumatic Eyes: negative findings: lids and lashes normal and conjunctivae and sclerae normal Ears: normal TM's and external ear canals both ears Throat: lips, mucosa, and tongue  normal; teeth and gums normal and upper dentures Neck: no adenopathy, no carotid bruit, supple, symmetrical, trachea midline and thyroid not enlarged, symmetric, no tenderness/mass/nodules Lungs: clear to auscultation bilaterally Heart: regular rate and rhythm, S1, S2 normal, no murmur, click, rub or gallop Extremities: extremities normal, atraumatic, no cyanosis or edema Pulses: 2+ and symmetric Lymph nodes: Cervical, supraclavicular, and axillary nodes normal.    Assessment:   1. Other specified hypothyroidism - TSH  2. Vitamin B12 deficiency - Vitamin B12  3. Essential hypertension, benign - Comprehensive metabolic panel - Microalbumin / creatinine urine ratio  4. Vitamin D deficiency - Vit D  25 hydroxy (rtn osteoporosis monitoring)  5. 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 Arthritis  7. Midline low back pain, with sciatica presence unspecified  See patient instructions for complete plan. See problem list for complete A&P F/u 6 mos.

## 2014-06-25 NOTE — Assessment & Plan Note (Signed)
C/o occasional bilateral leg weakness No bladder or bowel incontinence Pain relieved w/occasional meloxicam. Vit D level in 50's Check level today. Do not see DEXA scan-will review at f/u.

## 2014-06-25 NOTE — Progress Notes (Signed)
Pre visit review using our clinic review tool, if applicable. No additional management support is needed unless otherwise documented below in the visit note. 

## 2014-06-26 LAB — CBC WITH DIFFERENTIAL/PLATELET
BASOS ABS: 0 10*3/uL (ref 0.0–0.1)
Basophils Relative: 0.3 % (ref 0.0–3.0)
Eosinophils Absolute: 0.3 10*3/uL (ref 0.0–0.7)
Eosinophils Relative: 3.2 % (ref 0.0–5.0)
HCT: 40.6 % (ref 36.0–46.0)
HEMOGLOBIN: 13.2 g/dL (ref 12.0–15.0)
LYMPHS PCT: 36.7 % (ref 12.0–46.0)
Lymphs Abs: 3 10*3/uL (ref 0.7–4.0)
MCHC: 32.5 g/dL (ref 30.0–36.0)
MCV: 88.8 fl (ref 78.0–100.0)
MONOS PCT: 4.8 % (ref 3.0–12.0)
Monocytes Absolute: 0.4 10*3/uL (ref 0.1–1.0)
NEUTROS PCT: 55 % (ref 43.0–77.0)
Neutro Abs: 4.5 10*3/uL (ref 1.4–7.7)
PLATELETS: 230 10*3/uL (ref 150.0–400.0)
RBC: 4.57 Mil/uL (ref 3.87–5.11)
RDW: 13.5 % (ref 11.5–15.5)
WBC: 8.2 10*3/uL (ref 4.0–10.5)

## 2014-06-26 NOTE — Addendum Note (Signed)
Addended by: Jannette Spanner on: 06/26/2014 08:49 AM   Modules accepted: Orders

## 2014-06-27 ENCOUNTER — Telehealth: Payer: Self-pay | Admitting: Nurse Practitioner

## 2014-06-27 NOTE — Telephone Encounter (Signed)
pls call pt: Advise Labs look good, but Vitamin D is low. Start D3 2000 iu daily with meal.

## 2014-06-28 NOTE — Telephone Encounter (Signed)
Patient notified of results.

## 2014-08-02 ENCOUNTER — Other Ambulatory Visit: Payer: Self-pay | Admitting: Dermatology

## 2014-08-02 DIAGNOSIS — L570 Actinic keratosis: Secondary | ICD-10-CM | POA: Diagnosis not present

## 2014-08-02 DIAGNOSIS — L821 Other seborrheic keratosis: Secondary | ICD-10-CM | POA: Diagnosis not present

## 2014-08-02 DIAGNOSIS — D485 Neoplasm of uncertain behavior of skin: Secondary | ICD-10-CM | POA: Diagnosis not present

## 2014-08-02 DIAGNOSIS — Z85828 Personal history of other malignant neoplasm of skin: Secondary | ICD-10-CM | POA: Diagnosis not present

## 2014-08-23 ENCOUNTER — Telehealth: Payer: Self-pay

## 2014-08-23 NOTE — Telephone Encounter (Signed)
[facility] in [place] requesting refill on Alprazolam 1 mg for pt. #30

## 2014-08-23 NOTE — Telephone Encounter (Signed)
Please refill 30 T, 3 refills. I will sign tomorrow & have Sharyn Lull fax-put it on my desk.

## 2014-08-24 ENCOUNTER — Other Ambulatory Visit: Payer: Self-pay | Admitting: *Deleted

## 2014-08-24 MED ORDER — ALPRAZOLAM 1 MG PO TABS: 1.0000 mg | ORAL_TABLET | Freq: Every evening | ORAL | Status: DC | PRN

## 2014-08-24 NOTE — Telephone Encounter (Signed)
Rx faxed

## 2014-09-13 ENCOUNTER — Ambulatory Visit (INDEPENDENT_AMBULATORY_CARE_PROVIDER_SITE_OTHER): Payer: Medicare Other | Admitting: Nurse Practitioner

## 2014-09-13 ENCOUNTER — Encounter: Payer: Self-pay | Admitting: Nurse Practitioner

## 2014-09-13 VITALS — BP 138/72 | HR 68 | Temp 97.2°F | Ht 62.0 in | Wt 148.0 lb

## 2014-09-13 DIAGNOSIS — R002 Palpitations: Secondary | ICD-10-CM | POA: Diagnosis not present

## 2014-09-13 DIAGNOSIS — R202 Paresthesia of skin: Secondary | ICD-10-CM | POA: Diagnosis not present

## 2014-09-13 NOTE — Progress Notes (Signed)
Pre visit review using our clinic review tool, if applicable. No additional management support is needed unless otherwise documented below in the visit note. 

## 2014-09-13 NOTE — Patient Instructions (Addendum)
Start mobic every day or several times week. Let's see if it helps sensation of leg weakness.  If not, I will order MRI to evaluate spinal stenosis.  Start daily sinus rinse (Neilmed Sinus Rinse). Use several times week through winter for nasal dryness & bleeding.  Continue vitamin D daily.  See me in 3 weeks to re-evaluate leg weakness.  Spinal Stenosis Spinal stenosis is an abnormal narrowing of the canals of your spine (vertebrae). CAUSES  Spinal stenosis is caused by areas of bone pushing into the central canals of your vertebrae. This condition can be present at birth (congenital). It also may be caused by arthritic deterioration of your vertebrae (spinal degeneration).  SYMPTOMS   Pain that is generally worse with activities, particularly standing and walking.  Numbness, tingling, hot or cold sensations, weakness, or weariness in your legs.  Frequent episodes of falling.  A foot-slapping gait that leads to muscle weakness. DIAGNOSIS  Spinal stenosis is diagnosed with the use of magnetic resonance imaging (MRI) or computed tomography (CT). TREATMENT  Initial therapy for spinal stenosis focuses on the management of the pain and other symptoms associated with the condition. These therapies include:  Practicing postural changes to lessen pressure on your nerves.  Exercises to strengthen the core of your body.  Loss of excess body weight.  The use of nonsteroidal anti-inflammatory medicines to reduce swelling and inflammation in your nerves. When therapies to manage pain are not successful, surgery to treat spinal stenosis may be recommended. This surgery involves removing excess bone, which puts pressure on your nerve roots. During this surgery (laminectomy), the posterior boney arch (lamina) and excess bone around the facet joints are removed. Document Released: 10/24/2003 Document Revised: 12/18/2013 Document Reviewed: 11/11/2012 [facility] Patient Information 2015 [place],  [place]. This information is not intended to replace advice given to you by your health care provider. Make sure you discuss any questions you have with your health care provider.

## 2014-09-14 ENCOUNTER — Other Ambulatory Visit: Payer: Self-pay | Admitting: *Deleted

## 2014-09-14 DIAGNOSIS — I10 Essential (primary) hypertension: Secondary | ICD-10-CM

## 2014-09-14 MED ORDER — LOSARTAN POTASSIUM-HCTZ 50-12.5 MG PO TABS: 1.0000 | ORAL_TABLET | Freq: Every day | ORAL | Status: DC

## 2014-09-17 DIAGNOSIS — R202 Paresthesia of skin: Secondary | ICD-10-CM | POA: Insufficient documentation

## 2014-09-17 NOTE — Progress Notes (Signed)
Subjective:     Colleen Wright is a 74 y.o. female who presents w/c/o bilateral leg paresthesia without weakness. She reports no falls. She has difficulty describing sensation in legs-not tingly, "sort of numb & weak" but "not really weak". Duration-several mos., mild, stable, no aggravating factors. Rest seems to help.  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 This is likely cause of paresthesia.  She states her back hurts every day. She takes aleve with relief. She is not taking mobic as prescribed because she read SE. I explained mobic caries many of same risks as aleve.    The following portions of the patient's history were reviewed and updated as appropriate: allergies, current medications, past medical history, past social history, past surgical history and problem list.  Review of Systems Respiratory: negative for cough Cardiovascular: positive for palpitations and Hx of vessel disease w/stent. Last cardiac eval 2013. Last OV w/me pt sd she did not want to see cardiology. Musculoskeletal:negative for muscle weakness and myalgias Neurological: negative for coordination problems, gait problems, tremors and denies loss of bowel & bladder control    Objective:    BP 138/72 mmHg  Pulse 68  Temp(Src) 97.2 F (36.2 C) (Temporal)  Ht 5\' 2"  (1.575 m)  Wt 148 lb (67.132 kg)  BMI 27.06 kg/m2  SpO2 100% BP 138/72 mmHg  Pulse 68  Temp(Src) 97.2 F (36.2 C) (Temporal)  Ht 5\' 2"  (1.575 m)  Wt 148 lb (67.132 kg)  BMI 27.06 kg/m2  SpO2 100% General appearance: alert, cooperative, appears stated age and no distress Head: Normocephalic, without obvious abnormality, atraumatic Eyes: negative findings: lids and lashes normal and conjunctivae and sclerae normal Back: range of motion normal, mild-moderate kyphosis, no spinal tenderness  Lungs: clear to auscultation bilaterally Heart: regular rate and rhythm, S1, S2 normal, no  murmur, click, rub or gallop Extremities: extremities normal, atraumatic, no cyanosis or edema and strength 5/5, nml patellar reflex bilat Pulses: 2+ and symmetric    Assessment:Plan  1. Paresthesia of bilateral legs Likely due to spinal stenosis Start daily mobic, if no relief, ref to spine spec or order MRI  2. Palpitations Long hX, she has not wanted to see cardiology until today. She saw Dr Johnsie Cancel 2013: nml myovue. He recommended f/u w/Dr PCP Pt only wants to see her husband's cardiologist-she cannot remember his name. Will inquire when she gets home & call for appt. I will refer if needed.  F/u 3 weeks-leg paresthesia

## 2014-10-04 ENCOUNTER — Ambulatory Visit: Payer: Medicare Other | Admitting: Nurse Practitioner

## 2014-10-22 ENCOUNTER — Other Ambulatory Visit: Payer: Self-pay

## 2014-10-22 DIAGNOSIS — E039 Hypothyroidism, unspecified: Secondary | ICD-10-CM

## 2014-10-22 MED ORDER — LEVOTHYROXINE SODIUM 125 MCG PO TABS
125.0000 ug | ORAL_TABLET | Freq: Every day | ORAL | Status: DC
Start: 2014-10-22 — End: 2014-10-23

## 2014-10-23 ENCOUNTER — Other Ambulatory Visit: Payer: Self-pay | Admitting: Nurse Practitioner

## 2014-10-23 DIAGNOSIS — E039 Hypothyroidism, unspecified: Secondary | ICD-10-CM

## 2014-10-23 MED ORDER — LEVOTHYROXINE SODIUM 125 MCG PO TABS: 125.0000 ug | ORAL_TABLET | Freq: Every day | ORAL | Status: DC

## 2014-10-26 ENCOUNTER — Telehealth: Payer: Self-pay | Admitting: Nurse Practitioner

## 2014-10-26 ENCOUNTER — Other Ambulatory Visit: Payer: Self-pay | Admitting: Nurse Practitioner

## 2014-10-26 DIAGNOSIS — R202 Paresthesia of skin: Secondary | ICD-10-CM

## 2014-10-26 NOTE — Telephone Encounter (Signed)
Patient's back is still hurting. She would like to go to [facility] & get the MRI that you recommended during last OV.

## 2014-10-26 NOTE — Telephone Encounter (Signed)
Called and informed patient. She does not want to schedule OV at this time.

## 2014-10-26 NOTE — Telephone Encounter (Signed)
Please Advise: Called to inform patient about referral but she said that her back pain was different than normal. Its on her right side, she is concerned that it might be a kidney stone. She just wants to go to [facility] and get MRI to see. The pain has been going on for about 4 days now.

## 2014-10-26 NOTE — Telephone Encounter (Signed)
We do not order MRI for kidney stones. She needs OV if this is new problem.

## 2014-10-26 NOTE — Telephone Encounter (Signed)
Please advise 

## 2014-10-31 ENCOUNTER — Encounter: Payer: Self-pay | Admitting: Gastroenterology

## 2014-11-29 DIAGNOSIS — M4716 Other spondylosis with myelopathy, lumbar region: Secondary | ICD-10-CM | POA: Diagnosis not present

## 2014-11-29 DIAGNOSIS — M5416 Radiculopathy, lumbar region: Secondary | ICD-10-CM | POA: Diagnosis not present

## 2014-11-29 DIAGNOSIS — M5136 Other intervertebral disc degeneration, lumbar region: Secondary | ICD-10-CM | POA: Diagnosis not present

## 2014-11-29 DIAGNOSIS — M549 Dorsalgia, unspecified: Secondary | ICD-10-CM | POA: Diagnosis not present

## 2014-11-29 DIAGNOSIS — M545 Low back pain: Secondary | ICD-10-CM | POA: Diagnosis not present

## 2014-11-30 ENCOUNTER — Other Ambulatory Visit: Payer: Self-pay | Admitting: Orthopaedic Surgery

## 2014-11-30 DIAGNOSIS — M5417 Radiculopathy, lumbosacral region: Secondary | ICD-10-CM

## 2014-11-30 DIAGNOSIS — M5136 Other intervertebral disc degeneration, lumbar region: Secondary | ICD-10-CM

## 2014-12-07 ENCOUNTER — Other Ambulatory Visit: Payer: Self-pay

## 2014-12-07 MED ORDER — ALPRAZOLAM 1 MG PO TABS: 1.0000 mg | ORAL_TABLET | Freq: Every evening | ORAL | Status: DC | PRN

## 2014-12-07 NOTE — Telephone Encounter (Signed)
Please Advise Refill Request? Refill request for- Alprazolam 1mg  Tabs Last filled by MD on - 08/24/14 Last Appt - 09/13/14        Next Appt - 01/08/15 Pharmacy- [facility]

## 2014-12-13 ENCOUNTER — Ambulatory Visit
Admission: RE | Admit: 2014-12-13 | Discharge: 2014-12-13 | Disposition: A | Discharge status: Home / Self Care | Payer: Medicare Other | Source: Ambulatory Visit | Attending: Orthopaedic Surgery | Admitting: Orthopaedic Surgery

## 2014-12-13 DIAGNOSIS — M47817 Spondylosis without myelopathy or radiculopathy, lumbosacral region: Secondary | ICD-10-CM | POA: Diagnosis not present

## 2014-12-13 DIAGNOSIS — M5127 Other intervertebral disc displacement, lumbosacral region: Secondary | ICD-10-CM | POA: Diagnosis not present

## 2014-12-13 DIAGNOSIS — M4806 Spinal stenosis, lumbar region: Secondary | ICD-10-CM | POA: Diagnosis not present

## 2014-12-13 DIAGNOSIS — M5136 Other intervertebral disc degeneration, lumbar region: Secondary | ICD-10-CM

## 2014-12-13 DIAGNOSIS — M5417 Radiculopathy, lumbosacral region: Secondary | ICD-10-CM

## 2014-12-17 ENCOUNTER — Other Ambulatory Visit: Payer: Self-pay

## 2014-12-17 DIAGNOSIS — E039 Hypothyroidism, unspecified: Secondary | ICD-10-CM

## 2014-12-17 MED ORDER — LEVOTHYROXINE SODIUM 125 MCG PO TABS: 125.0000 ug | ORAL_TABLET | Freq: Every day | ORAL | Status: DC

## 2014-12-17 NOTE — Telephone Encounter (Signed)
Please Advise Refill Request? Refill request for- Levothyoxine  Last filled by MD on - 10/23/14 Last Appt - 06/25/14        Next Appt - 01/08/15 [facility]acy- [facility]

## 2014-12-20 DIAGNOSIS — Z0000 Encounter for general adult medical examination without abnormal findings: Secondary | ICD-10-CM | POA: Diagnosis not present

## 2015-01-03 DIAGNOSIS — M5136 Other intervertebral disc degeneration, lumbar region: Secondary | ICD-10-CM | POA: Diagnosis not present

## 2015-01-03 DIAGNOSIS — M5416 Radiculopathy, lumbar region: Secondary | ICD-10-CM | POA: Diagnosis not present

## 2015-01-03 DIAGNOSIS — M545 Low back pain: Secondary | ICD-10-CM | POA: Diagnosis not present

## 2015-01-08 ENCOUNTER — Ambulatory Visit: Payer: 59 | Admitting: Nurse Practitioner

## 2015-01-15 ENCOUNTER — Encounter: Payer: Self-pay | Admitting: Nurse Practitioner

## 2015-01-15 ENCOUNTER — Ambulatory Visit (INDEPENDENT_AMBULATORY_CARE_PROVIDER_SITE_OTHER): Payer: Medicare Other | Admitting: Nurse Practitioner

## 2015-01-15 VITALS — BP 111/68 | HR 80 | Temp 98.0°F | Ht 62.0 in | Wt 151.0 lb

## 2015-01-15 DIAGNOSIS — E039 Hypothyroidism, unspecified: Secondary | ICD-10-CM | POA: Diagnosis not present

## 2015-01-15 DIAGNOSIS — E538 Deficiency of other specified B group vitamins: Secondary | ICD-10-CM

## 2015-01-15 DIAGNOSIS — E559 Vitamin D deficiency, unspecified: Secondary | ICD-10-CM | POA: Diagnosis not present

## 2015-01-15 DIAGNOSIS — R42 Dizziness and giddiness: Secondary | ICD-10-CM | POA: Diagnosis not present

## 2015-01-15 LAB — COMPREHENSIVE METABOLIC PANEL
ALBUMIN: 4 g/dL (ref 3.5–5.2)
ALT: 15 U/L (ref 0–35)
AST: 20 U/L (ref 0–37)
Alkaline Phosphatase: 82 U/L (ref 39–117)
BILIRUBIN TOTAL: 0.4 mg/dL (ref 0.2–1.2)
BUN: 22 mg/dL (ref 6–23)
CO2: 28 meq/L (ref 19–32)
Calcium: 9.4 mg/dL (ref 8.4–10.5)
Chloride: 101 mEq/L (ref 96–112)
Creatinine, Ser: 1.11 mg/dL (ref 0.40–1.20)
GFR: 50.81 mL/min — AB (ref >60.00)
GLUCOSE: 86 mg/dL (ref 70–99)
Potassium: 3.9 mEq/L (ref 3.5–5.1)
Sodium: 137 mEq/L (ref 135–145)
Total Protein: 7 g/dL (ref 6.0–8.3)

## 2015-01-15 LAB — VITAMIN D 25 HYDROXY (VIT D DEFICIENCY, FRACTURES): VITD: 23.51 ng/mL — AB (ref 30.00–100.00)

## 2015-01-15 LAB — VITAMIN B12: VITAMIN B 12: 279 pg/mL (ref 211–911)

## 2015-01-15 LAB — TSH: TSH: 0.48 u[IU]/mL (ref 0.35–4.50)

## 2015-01-15 NOTE — Progress Notes (Signed)
Pre visit review using our clinic review tool, if applicable. No additional management support is needed unless otherwise documented below in the visit note. 

## 2015-01-15 NOTE — Patient Instructions (Signed)
PLEASE see neurology for further evaluation of dizzy episodes.  My office will call with lab results.  Let me know if blood pressure is running below 110.  See me in 6 months.

## 2015-01-15 NOTE — Progress Notes (Signed)
Subjective:     Colleen Wright is a 74 y.o. female returns for f/u hypothyroidism secondary to treatment for thyroid cancer, dizzy episode, HTN, vit D & B12 deficiencies, leg pain.   hypothyroidism: no dose changes. Taking 125 mcg synthroid. No symptoms. dizzy episode last week accompanied by blurred vision & felt she was falling to right. Pt reports"never had one that bad". Scared her, thought she was dying. Lasted 10 mins. Called her husband in & he gave her a meclizine tablet. Has been having episodes for several yrs. Prev w/u: 2013 brain CT (white matter changes, possible lacunar infarct) followed by Brain MRI (did not confirm infarct, only white matter changes c/w age). Cardiology w/u r/o heart etio. HTN: no intol Se to med. GFR 49, slight elevation of microalb. Remains active. vit D & B12 deficiencies: still taking 2000 iu D3 qd. Stopped B12 few mos ago.  Leg pain: she is seeing [facility]. Reviewed OV notes dated 11/29/14 & 01/03/15: Dx lumbar spondylosis, mulit-level facet hypertrophy. FM:[id number], flexeril, Consider steroid inj if no improvement.  Pt states pain worse after working in yard. Flexeril & mobic help. Considering getting injections.  The following portions of the patient's history were reviewed and updated as appropriate: allergies, current medications, past medical history, past social history, past surgical history and problem list.  Review of Systems Pertinent items are noted in HPI.    Objective:    BP 111/68 mmHg  Pulse 80  Temp(Src) 98 F (36.7 C) (Oral)  Ht 5\' 2"  (1.575 m)  Wt 151 lb (68.493 kg)  BMI 27.61 kg/m2  SpO2 97% BP 111/68 mmHg  Pulse 80  Temp(Src) 98 F (36.7 C) (Oral)  Ht 5\' 2"  (1.575 m)  Wt 151 lb (68.493 kg)  BMI 27.61 kg/m2  SpO2 97% General appearance: alert, cooperative, appears stated age and no distress Head: Normocephalic, without obvious abnormality, atraumatic Eyes: negative findings: lids and lashes  normal and conjunctivae and sclerae normal Lungs: clear to auscultation bilaterally Heart: regular rate and rhythm, S1, S2 normal, no murmur, click, rub or gallop Neurologic: Mental status: Alert, oriented, thought content appropriate Gait: slightly stooped posture     Assessment:Plan   1. Episode of dizziness DD: Cerebro VD, BPPV - Ambulatory referral to Neurology - Comprehensive metabolic panel  2. Hypothyroidism, unspecified hypothyroidism type Continue 125 mcg synthroid - Comprehensive metabolic panel - TSH  3. Vitamin D deficiency - Vit D  25 hydroxy (rtn osteoporosis monitoring)  4. Vitamin B 12 deficiency - Vitamin B12  F/u 6 mos-htn, thyroid, d, b12

## 2015-01-15 NOTE — Assessment & Plan Note (Signed)
Ref to neurology for further w/u.

## 2015-01-16 ENCOUNTER — Ambulatory Visit (INDEPENDENT_AMBULATORY_CARE_PROVIDER_SITE_OTHER): Payer: Medicare Other | Admitting: Family Medicine

## 2015-01-16 ENCOUNTER — Telehealth: Payer: Self-pay | Admitting: Nurse Practitioner

## 2015-01-16 DIAGNOSIS — E559 Vitamin D deficiency, unspecified: Secondary | ICD-10-CM

## 2015-01-16 DIAGNOSIS — E538 Deficiency of other specified B group vitamins: Secondary | ICD-10-CM | POA: Diagnosis not present

## 2015-01-16 MED ORDER — VITAMIN D3 1.25 MG (50000 UT) PO CAPS: 1.0000 | ORAL_CAPSULE | ORAL | Status: DC

## 2015-01-16 MED ORDER — CYANOCOBALAMIN 1000 MCG/ML IJ SOLN
1000.0000 ug | Freq: Once | INTRAMUSCULAR | Status: AC
  Administered 2015-01-16: 1000 ug via INTRAMUSCULAR

## 2015-01-16 NOTE — Telephone Encounter (Signed)
Called and informed patient. Lab appt scheduled. She is going to think about coming in to get the ONE b12 shot and will call us back if she wants too.

## 2015-01-16 NOTE — Telephone Encounter (Signed)
pls call pt: Advise B12 is low again, not as much as before, but she will need to start B complex. Take daily to get 1200 mcg or 1 mg of B12. She can come in for ONE B12 inj if she wants to get a jump start. Vit D is low. Start prescription vitamin D. Take 1 capsule weekly for 12 weeks. Level will be checked again in 3 mos. Pls sched lab appt in 3 mos.

## 2015-02-28 ENCOUNTER — Other Ambulatory Visit: Payer: Self-pay | Admitting: Family Medicine

## 2015-02-28 DIAGNOSIS — E039 Hypothyroidism, unspecified: Secondary | ICD-10-CM

## 2015-02-28 MED ORDER — LEVOTHYROXINE SODIUM 125 MCG PO TABS: 125.0000 ug | ORAL_TABLET | Freq: Every day | ORAL | Status: DC

## 2015-03-08 ENCOUNTER — Encounter: Payer: Self-pay | Admitting: Neurology

## 2015-03-08 ENCOUNTER — Ambulatory Visit (INDEPENDENT_AMBULATORY_CARE_PROVIDER_SITE_OTHER): Payer: Medicare Other | Admitting: Neurology

## 2015-03-08 VITALS — BP 130/68 | HR 76 | Resp 18 | Ht 62.0 in | Wt 149.0 lb

## 2015-03-08 DIAGNOSIS — R42 Dizziness and giddiness: Secondary | ICD-10-CM | POA: Insufficient documentation

## 2015-03-08 DIAGNOSIS — I679 Cerebrovascular disease, unspecified: Secondary | ICD-10-CM | POA: Diagnosis not present

## 2015-03-08 NOTE — Progress Notes (Signed)
NEUROLOGY CONSULTATION NOTE  FLORENA KOZMA MRN: [id number] DOB: July 08, 1941  Referring provider: Nicky Pugh, NP Primary care provider: Nicky Pugh, NP  Reason for consult:  dizziness  HISTORY OF PRESENT ILLNESS: Colleen Wright is a 74 year old right-handed woman with hypothyroidism secondary to treatment for thyroid cancer, hypertension, CAD, vitamin D deficiency and B12 deficiency who presents for dizziness.  PCP note, cardiology note, labs and images of prior MRI of brain from 2013 reviewed.  She has history of dizzy spells going back at least 3 years (she cannot remember).  She describes it as a lightheadedness and feeling that she is going to fall over.  Only once was it associated with spinning.  She feels diffuse weakness in her arms and legs.  Sometimes it is difficulty to write because her arm feels heavy.  There is no associated headache, visual disturbance, focal weakness, numbness slurred speech, or nausea.  It is sudden onset and is not positional.  It can happen when sitting or standing still.  It usually lasts about 5 minutes but she doesn't feel "right" afterwards.  She does report some dizziness frequently but it is only severe once in a while.  However she had two recent episodes in the last month.  As per PCP note, she has had unremarkable cardiac workup in 2013, including a normal myoview.  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  Labs from 01/15/15 showed TSH 0.48, B12 279 and vitamin D 23.51.  She is taking supplements now.  CMP was unremarkable.  Most recent fasting lipid panel from 03/24/13 showed cholesterol of 228, HDL 51 and LDL of 157.  PAST MEDICAL HISTORY: Past Medical History  Diagnosis Date  . Colon polyps   . Allergy   .  Arthritis     low back  . Cancer     thyroid, s/p thyroidectomy 8 years ago  . Depression   . GERD (gastroesophageal reflux disease)   . Hyperlipidemia   . Hypertension   . Ulcer   . Hypothyroidism 06/12/2011  . Hiatal hernia   . CAD (coronary artery disease) 06/12/2011  . Back pain 06/13/2011  . Vertigo 06/13/2011  . Leg fracture   . Thyroid cancer   . Skin lesions, generalized 05/23/2012  . Pain of left heel 05/23/2012  . Pain in joint, ankle and foot 11/02/2012    PAST SURGICAL HISTORY: Past Surgical History  Procedure Laterality Date  . Thyroidectomy, partial      twice  . Fracture surgery      left leg, pin and 5 bolts in place in lower tibia  . Tubal ligation      growth removed from posterior vaginal vault  . Lesion removal      vaginal  . Balloon angioplasty, artery      MEDICATIONS: Current Outpatient Prescriptions on File Prior to Visit  Medication Sig Dispense Refill  . ALPRAZolam (XANAX) 1 MG tablet Take 1 tablet (1 mg total) by mouth at bedtime as needed. 30 tablet 2  . aspirin 325 MG tablet Take 325 mg by mouth daily. Patient used this medication for dizziness.    . Cholecalciferol (VITAMIN D3) 50000 UNITS CAPS Take 1 capsule by mouth every 7 (seven) days. 12 capsule 0  . levothyroxine (SYNTHROID, LEVOTHROID) 125  MCG tablet Take 1 tablet (125 mcg total) by mouth daily. 30 tablet 5  . losartan-hydrochlorothiazide (HYZAAR) 50-12.5 MG per tablet Take 1 tablet by mouth daily. 90 tablet 1  . nitroGLYCERIN (NITROSTAT) 0.4 MG SL tablet Place 1 tablet (0.4 mg total) under the tongue every 5 (five) minutes x 3 doses as needed for chest pain. 25 tablet 3   No current facility-administered medications on file prior to visit.    ALLERGIES: Allergies  Allergen Reactions  . Amoxicillin Other (See Comments)    Patient develops a very bad yeast infection.    FAMILY HISTORY: Family History  Problem Relation Age of Onset  . Cancer Mother     ovary/uterus-can't  remember  . Stroke Mother   . Heart disease Father   . Heart disease Sister     bypass  . Stroke Sister   . Cancer Sister     LN, bone  . Hypertension Sister   . Heart disease Brother     bypass  . Cancer Cousin     breast    SOCIAL HISTORY: History   Social History  . Marital Status: Married    Spouse Name: N/A  . Number of Children: N/A  . Years of Education: N/A   Occupational History  . Not on file.   Social History Main Topics  . Smoking status: Former Research scientist (life sciences)  . Smokeless tobacco: Never Used  . Alcohol Use: No  . Drug Use: No  . Sexual Activity: Not Currently   Other Topics Concern  . Not on file   Social History Narrative    REVIEW OF SYSTEMS: Constitutional: No fevers, chills, or sweats, no generalized fatigue, change in appetite Eyes: No visual changes, double vision, eye pain Ear, nose and throat: No hearing loss, ear pain, nasal congestion, sore throat Cardiovascular: No chest pain, palpitations Respiratory:  No shortness of breath at rest or with exertion, wheezes GastrointestinaI: No nausea, vomiting, diarrhea, abdominal pain, fecal incontinence Genitourinary:  No dysuria, urinary retention or frequency Musculoskeletal:  No neck pain, back pain Integumentary: No rash, pruritus, skin lesions Neurological: as above Psychiatric: No depression, insomnia, anxiety Endocrine: No palpitations, fatigue, diaphoresis, mood swings, change in appetite, change in weight, increased thirst Hematologic/Lymphatic:  No anemia, purpura, petechiae. Allergic/Immunologic: no itchy/runny eyes, nasal congestion, recent allergic reactions, rashes  PHYSICAL EXAM: Filed Vitals:   03/08/15 1451  BP: 130/68  Pulse: 76  Resp: 18   General: No acute distress.  Patient appears well-groomed.  normal body habitus. Head:  Normocephalic/atraumatic Eyes:  fundi unremarkable, without vessel changes, exudates, hemorrhages or papilledema. Neck: supple, no paraspinal tenderness,  full range of motion Back: No paraspinal tenderness Heart: regular rate and rhythm Lungs: Clear to auscultation bilaterally. Vascular: No carotid bruits. Neurological Exam: Mental status: alert and oriented to person, place, and time, recent and remote memory intact, fund of knowledge intact, attention and concentration intact, speech fluent and not dysarthric, language intact. Cranial nerves: CN I: not tested CN II: pupils equal, round and reactive to light, visual fields intact, fundi unremarkable, without vessel changes, exudates, hemorrhages or papilledema. CN III, IV, VI:  full range of motion, no nystagmus, no ptosis CN V: facial sensation intact CN VII: upper and lower face symmetric CN VIII: hearing intact CN IX, X: gag intact, uvula midline CN XI: sternocleidomastoid and trapezius muscles intact CN XII: tongue midline Bulk & Tone: normal, no fasciculations. Motor:  5/5 throughout Sensation:  Pinprick and vibration intact Deep Tendon Reflexes:  2+ throughout,  toes downgoing Finger to nose testing:  No dysmetria Heel to shin:  No dysmetria Gait:  Normal stance and stride.  Able to turn.  Unable to tandem walk. Romberg with mild sway.  IMPRESSION: Dizziness.  Unclear etiology.  She mostly describes a lightheadedness rather than vertigo.  No focal neurologic symptoms endorsed.  Negative orthostatics Cerebrovascular disease  PLAN: Check MRI brain with and without contrast  Check fasting lipid panel (LDL goal should be less than 100) On ASA 325mg  daily Will call with results  Thank you for allowing me to take part in the care of this patient.  Metta Clines, DO  CC:  Nicky Pugh, NP

## 2015-03-08 NOTE — Patient Instructions (Addendum)
Check MRI brain without contrast [facility] 03/12/15 at 10:45am Check fasting lipid panel Will call with results.

## 2015-03-11 ENCOUNTER — Ambulatory Visit (HOSPITAL_COMMUNITY): Payer: Medicare Other

## 2015-03-18 ENCOUNTER — Other Ambulatory Visit: Payer: Self-pay | Admitting: *Deleted

## 2015-03-18 DIAGNOSIS — I10 Essential (primary) hypertension: Secondary | ICD-10-CM

## 2015-03-18 MED ORDER — LOSARTAN POTASSIUM-HCTZ 50-12.5 MG PO TABS: 1.0000 | ORAL_TABLET | Freq: Every day | ORAL | Status: DC

## 2015-03-21 ENCOUNTER — Other Ambulatory Visit (HOSPITAL_COMMUNITY): Payer: Medicare Other

## 2015-03-29 ENCOUNTER — Ambulatory Visit (HOSPITAL_COMMUNITY)
Admission: RE | Admit: 2015-03-29 | Discharge: 2015-03-29 | Disposition: A | Discharge status: Home / Self Care | Payer: Medicare Other | Source: Ambulatory Visit | Attending: Neurology | Admitting: Neurology

## 2015-03-29 DIAGNOSIS — Z8673 Personal history of transient ischemic attack (TIA), and cerebral infarction without residual deficits: Secondary | ICD-10-CM | POA: Insufficient documentation

## 2015-03-29 DIAGNOSIS — Z85850 Personal history of malignant neoplasm of thyroid: Secondary | ICD-10-CM | POA: Diagnosis not present

## 2015-03-29 DIAGNOSIS — R42 Dizziness and giddiness: Secondary | ICD-10-CM | POA: Insufficient documentation

## 2015-03-29 DIAGNOSIS — G319 Degenerative disease of nervous system, unspecified: Secondary | ICD-10-CM | POA: Diagnosis not present

## 2015-03-29 DIAGNOSIS — I679 Cerebrovascular disease, unspecified: Secondary | ICD-10-CM

## 2015-03-29 LAB — CREATININE, SERUM
Creatinine, Ser: 1.44 mg/dL — ABNORMAL HIGH (ref 0.44–1.00)
GFR calc Af Amer: 40 mL/min — ABNORMAL LOW (ref >60)
GFR, EST NON AFRICAN AMERICAN: 34 mL/min — AB (ref >60)

## 2015-03-29 MED ORDER — GADOBENATE DIMEGLUMINE 529 MG/ML IV SOLN
7.0000 mL | Freq: Once | INTRAVENOUS | Status: AC | PRN
  Administered 2015-03-29: 7 mL via INTRAVENOUS

## 2015-04-03 ENCOUNTER — Telehealth: Payer: Self-pay | Admitting: *Deleted

## 2015-04-03 NOTE — Telephone Encounter (Signed)
Patient is  Aware of MRI results

## 2015-04-03 NOTE — Telephone Encounter (Signed)
-----   Message from Pieter Partridge, DO sent at 04/01/2015  7:00 AM EDT ----- MRI of brain is stable compared to prior MRI from 2013.  I have no neurologic explanation for dizziness and lightheadedness.  She should continue with the aspirin.  I still do not have lab results, if they were done.

## 2015-04-29 ENCOUNTER — Other Ambulatory Visit: Payer: Medicare Other

## 2015-05-09 ENCOUNTER — Encounter: Payer: Self-pay | Admitting: Gastroenterology

## 2015-06-19 ENCOUNTER — Ambulatory Visit: Payer: No Typology Code available for payment source | Admitting: Physician Assistant

## 2015-06-21 ENCOUNTER — Encounter: Payer: Self-pay | Admitting: Family Medicine

## 2015-06-21 ENCOUNTER — Ambulatory Visit (INDEPENDENT_AMBULATORY_CARE_PROVIDER_SITE_OTHER): Payer: Medicare Other | Admitting: Family Medicine

## 2015-06-21 VITALS — BP 136/83 | HR 65 | Temp 97.9°F | Resp 20 | Wt 148.8 lb

## 2015-06-21 DIAGNOSIS — I25119 Atherosclerotic heart disease of native coronary artery with unspecified angina pectoris: Secondary | ICD-10-CM

## 2015-06-21 DIAGNOSIS — Z23 Encounter for immunization: Secondary | ICD-10-CM | POA: Diagnosis not present

## 2015-06-21 DIAGNOSIS — E559 Vitamin D deficiency, unspecified: Secondary | ICD-10-CM | POA: Diagnosis not present

## 2015-06-21 DIAGNOSIS — E538 Deficiency of other specified B group vitamins: Secondary | ICD-10-CM

## 2015-06-21 DIAGNOSIS — E039 Hypothyroidism, unspecified: Secondary | ICD-10-CM

## 2015-06-21 DIAGNOSIS — I10 Essential (primary) hypertension: Secondary | ICD-10-CM

## 2015-06-21 DIAGNOSIS — E785 Hyperlipidemia, unspecified: Secondary | ICD-10-CM

## 2015-06-21 DIAGNOSIS — K635 Polyp of colon: Secondary | ICD-10-CM

## 2015-06-21 LAB — COMPREHENSIVE METABOLIC PANEL
ALK PHOS: 80 U/L (ref 39–117)
ALT: 13 U/L (ref 0–35)
AST: 18 U/L (ref 0–37)
Albumin: 3.9 g/dL (ref 3.5–5.2)
BILIRUBIN TOTAL: 0.5 mg/dL (ref 0.2–1.2)
BUN: 23 mg/dL (ref 6–23)
CO2: 30 meq/L (ref 19–32)
Calcium: 9.6 mg/dL (ref 8.4–10.5)
Chloride: 102 mEq/L (ref 96–112)
Creatinine, Ser: 1.03 mg/dL (ref 0.40–1.20)
GFR: 55.33 mL/min — ABNORMAL LOW (ref >60.00)
Glucose, Bld: 88 mg/dL (ref 70–99)
Potassium: 4 mEq/L (ref 3.5–5.1)
Sodium: 139 mEq/L (ref 135–145)
TOTAL PROTEIN: 6.8 g/dL (ref 6.0–8.3)

## 2015-06-21 LAB — LIPID PANEL
CHOLESTEROL: 191 mg/dL (ref 0–200)
HDL: 48.4 mg/dL (ref >39.00)
LDL Cholesterol: 128 mg/dL — ABNORMAL HIGH (ref 0–99)
NonHDL: 142.12
Total CHOL/HDL Ratio: 4
Triglycerides: 72 mg/dL (ref 0.0–149.0)
VLDL: 14.4 mg/dL (ref 0.0–40.0)

## 2015-06-21 LAB — TSH: TSH: 1.74 u[IU]/mL (ref 0.35–4.50)

## 2015-06-21 LAB — CBC WITH DIFFERENTIAL/PLATELET
BASOS ABS: 0 10*3/uL (ref 0.0–0.1)
Basophils Relative: 0.6 % (ref 0.0–3.0)
EOS PCT: 6.8 % — AB (ref 0.0–5.0)
Eosinophils Absolute: 0.5 10*3/uL (ref 0.0–0.7)
HCT: 38.3 % (ref 36.0–46.0)
HEMOGLOBIN: 12.7 g/dL (ref 12.0–15.0)
LYMPHS ABS: 2.3 10*3/uL (ref 0.7–4.0)
Lymphocytes Relative: 32.5 % (ref 12.0–46.0)
MCHC: 33 g/dL (ref 30.0–36.0)
MCV: 89.4 fl (ref 78.0–100.0)
MONO ABS: 0.5 10*3/uL (ref 0.1–1.0)
MONOS PCT: 7.8 % (ref 3.0–12.0)
NEUTROS PCT: 52.3 % (ref 43.0–77.0)
Neutro Abs: 3.7 10*3/uL (ref 1.4–7.7)
Platelets: 210 10*3/uL (ref 150.0–400.0)
RBC: 4.29 Mil/uL (ref 3.87–5.11)
RDW: 12.9 % (ref 11.5–15.5)
WBC: 7 10*3/uL (ref 4.0–10.5)

## 2015-06-21 LAB — VITAMIN B12: Vitamin B-12: 783 pg/mL (ref 211–911)

## 2015-06-21 LAB — VITAMIN D 25 HYDROXY (VIT D DEFICIENCY, FRACTURES): VITD: 48.34 ng/mL (ref 30.00–100.00)

## 2015-06-21 MED ORDER — ZOSTER VACCINE LIVE 19400 UNT/0.65ML ~~LOC~~ SOLR: 0.6500 mL | Freq: Once | SUBCUTANEOUS | Status: DC

## 2015-06-21 MED ORDER — ALPRAZOLAM 1 MG PO TABS: 1.0000 mg | ORAL_TABLET | Freq: Every evening | ORAL | Status: DC | PRN

## 2015-06-21 NOTE — Progress Notes (Signed)
 Subjective:    Patient ID: Colleen Wright, female    DOB: 08/11/1941, 74 y.o.   MRN: [id number]  HPI   Colleen Wright is a 73 y.o. female returns for f/u hypothyroidism secondary to treatment for thyroid cancer, HTN, vit D & B12 deficiencies.  hypothyroidism: Patient reports compliance with 125 g of Synthroid daily. She states she's been on this medication for some time with no dose changes, last TSH 0.48. Patient does have a history of thyroid cancer with recent thyroidectomy.  Hypertension:  reports compliance with losartan/HCTZ 50-12 0.5. Last GFR of 40, was a change with a creatinine of 1.44 from prior collection. She states she does not watch the salt content in her diet, she does not add salt but does not watch the content within what she is eating. She currently takes care of her 2 half-year-old great grandson and remains very active.  vit D & B12 deficiencies: Patient still taken daily D3 supplementation of 2000 international units. She states she finished all of the B 12 supplementation and no longer takes it. She has not had her levels rechecked. Last vitamin D was 23 in May. Patient states she does feel much improved after taking supplementations.  Health maintenance:  Colonoscopy: H/o polyps, colonoscopy every 5 years, due for repeat exam. No colon  cancer in the family.  Mammogram: UTD 2015, normal, no FHX, repeat 2017 Cervical cancer screening: Not indicated Immunizations: Flu indicated, PNA 23 09/2013, PCV 13 indicated. Td unknown, pts states within 3 years. Infectious disease screening: Not indicated Dexa: Completed this year, normal per pt, no report.  Of note: pt states at the end of appt she needs refills on xanax for her insomnia. Pt encouraged to make an appointment to discuss in detail and I do not recommend xanax in anyone over 65, as first line therapy.  Past Medical History  Diagnosis Date  . Colon polyps   . Allergy   . Arthritis     low back  . Cancer (HCC)       thyroid, s/p thyroidectomy 8 years ago  . Depression   . GERD (gastroesophageal reflux disease)   . Hyperlipidemia   . Hypertension   . Ulcer   . Hypothyroidism 06/12/2011  . Hiatal hernia   . CAD (coronary artery disease) 06/12/2011  . Back pain 06/13/2011  . Vertigo 06/13/2011  . Leg fracture   . Thyroid cancer (HCC)   . Skin lesions, generalized 05/23/2012  . Pain of left heel 05/23/2012  . Pain in joint, ankle and foot 11/02/2012   Allergies  Allergen Reactions  . Amoxicillin Other (See Comments)    Patient develops a very bad yeast infection.   Past Surgical History  Procedure Laterality Date  . Thyroidectomy, partial      twice  . Fracture surgery      left leg, pin and 5 bolts in place in lower tibia  . Tubal ligation      growth removed from posterior vaginal vault  . Lesion removal      vaginal  . Balloon angioplasty, artery     Family History  Problem Relation Age of Onset  . Cancer Mother     ovary/uterus-can't remember  . Stroke Mother   . Heart disease Father   . Heart disease Sister     bypass  . Stroke Sister   . Cancer Sister     LN, bone  . Hypertension Sister   . Heart disease Brother       bypass  . Cancer Cousin     breast   Social History   Social History  . Marital Status: Married    Spouse Name: N/A  . Number of Children: N/A  . Years of Education: N/A   Occupational History  . Not on file.   Social History Main Topics  . Smoking status: Former Smoker  . Smokeless tobacco: Never Used  . Alcohol Use: No  . Drug Use: No  . Sexual Activity: Not Currently   Other Topics Concern  . Not on file   Social History Narrative    Review of Systems Negative, with the exception of above mentioned in HPI     Objective:   Physical Exam BP 136/83 mmHg  Pulse 65  Temp(Src) 97.9 F (36.6 C) (Oral)  Resp 20  Wt 148 lb 12 oz (67.473 kg)  SpO2 100%  Body mass index is 27.2 kg/(m^2). Gen: Afebrile. No acute distress. Nontoxic in  appearance, well-developed, well-nourished, Caucasian female.  HENT: AT. [place]. Bilateral TM visualized and normal in appearance. MMM.  Eyes:Pupils Equal Round Reactive to light, Extraocular movements intact,  Conjunctiva without redness, discharge or icterus. Neck/lymp/endocrine: Supple,no  lymphadenopathy, no  Thyromegaly, thyroidectomy scar well-healed.  CV: RRR , no murmur palpated ,+2/4 P posterior tibialis pulses Chest: CTAB, no wheeze or crackles Abd: Soft. NTND. BS present. no  Masses palpated Neuro:Normal gait. PERLA. EOMi. Alert. Oriented x3.     Assessment & Plan:  1. Need for prophylactic vaccination and inoculation against influenza - Flu Vaccine QUAD 36+ mos PF IM (Fluarix & Fluzone Quad PF)  2. Vitamin B 12 deficiency - Vitamin B12  3. Hypothyroidism, unspecified hypothyroidism type - TSH - Patient will need refills on Synthroid once TSH levels return, if normal will refill 125 g with 90 day prescription, if abnormal will adjust medication.  4. Essential hypertension - Continue losartan/HCTZ 50-12 0.5 daily. Urged patient to start to watch the salt content in her diet. - Comp Met (CMET) - CBC w/Diff - Lipid panel  5. Hyperlipidemia - Patient currently not on medications for hyperlipidemia. Encouraged patient to follow a diet full of fresh vegetables, lean meats fresh fruits. - Patient has history of coronary artery disease and cerebrovascular disease. She is over the age of 73, does not recall being on a cholesterol lowering medication in the past. She does take 325 aspirin daily. - Lipid panel   6 Coronary artery disease with unspecified angina pectoris - Lipid panel  7 Vitamin D deficiency - Vitamin D (25 hydroxy)   9. Need for vaccination with 13-polyvalent pneumococcal conjugate vaccine - Pneumococcal conjugate vaccine 13-valent Health maintenance:  Colonoscopy: H/o polyps, colonoscopy every 5 years, due for repeat exam. No colon  cancer in the family.    Mammogram: UTD 2015, normal, no FHX, repeat 2017 Cervical cancer screening: Not indicated Immunizations: Flu indicated, PNA 23 09/2013, PCV 13 adminstered today. Td unknown, pts states within 3 years. Infectious disease screening: Not indicated Dexa: Completed this year, normal per pt, no report.  F/u 6 months for HTN Sooner if wants to discuss insomnia, provided refill on xanax, however she will need to f/u to discuss need and other therapies. Would like to DC xanax and start Trazodone. 

## 2015-06-21 NOTE — Patient Instructions (Addendum)
Health Maintenance, Female Adopting a healthy lifestyle and getting preventive care can go a long way to promote health and wellness. Talk with your health care provider about what schedule of regular examinations is right for you. This is a good chance for you to check in with your provider about disease prevention and staying healthy. In between checkups, there are plenty of things you can do on your own. Experts have done a lot of research about which lifestyle changes and preventive measures are most likely to keep you healthy. Ask your health care provider for more information. WEIGHT AND DIET  Eat a healthy diet  Be sure to include plenty of vegetables, fruits, low-fat dairy products, and lean protein.  Do not eat a lot of foods high in solid fats, added sugars, or salt.  Get regular exercise. This is one of the most important things you can do for your health.  Most adults should exercise for at least 150 minutes each week. The exercise should increase your heart rate and make you sweat (moderate-intensity exercise).  Most adults should also do strengthening exercises at least twice a week. This is in addition to the moderate-intensity exercise.  Maintain a healthy weight  Body mass index (BMI) is a measurement that can be used to identify possible weight problems. It estimates body fat based on height and weight. Your health care provider can help determine your BMI and help you achieve or maintain a healthy weight.  For females 20 years of age and older:   A BMI below 18.5 is considered underweight.  A BMI of 18.5 to 24.9 is normal.  A BMI of 25 to 29.9 is considered overweight.  A BMI of 30 and above is considered obese.  Watch levels of cholesterol and blood lipids  You should start having your blood tested for lipids and cholesterol at 76 years of age, then have this test every 5 years.  You may need to have your cholesterol levels checked more often if:  Your lipid  or cholesterol levels are high.  You are older than 76 years of age.  You are at high risk for heart disease.  CANCER SCREENING   Lung Cancer  Lung cancer screening is recommended for adults 55-80 years old who are at high risk for lung cancer because of a history of smoking.  A yearly low-dose CT scan of the lungs is recommended for people who:  Currently smoke.  Have quit within the past 15 years.  Have at least a 30-pack-year history of smoking. A pack year is smoking an average of one pack of cigarettes a day for 1 year.  Yearly screening should continue until it has been 15 years since you quit.  Yearly screening should stop if you develop a health problem that would prevent you from having lung cancer treatment.  Breast Cancer  Practice breast self-awareness. This means understanding how your breasts normally appear and feel.  It also means doing regular breast self-exams. Let your health care provider know about any changes, no matter how small.  If you are in your 20s or 30s, you should have a clinical breast exam (CBE) by a health care provider every 1-3 years as part of a regular health exam.  If you are 40 or older, have a CBE every year. Also consider having a breast X-ray (mammogram) every year.  If you have a family history of breast cancer, talk to your health care provider about genetic screening.  If you   are at high risk for breast cancer, talk to your health care provider about having an MRI and a mammogram every year.  Breast cancer gene (BRCA) assessment is recommended for women who have family members with BRCA-related cancers. BRCA-related cancers include:  Breast.  Ovarian.  Tubal.  Peritoneal cancers.  Results of the assessment will determine the need for genetic counseling and BRCA1 and BRCA2 testing. Cervical Cancer Your health care provider may recommend that you be screened regularly for cancer of the pelvic organs (ovaries, uterus, and  vagina). This screening involves a pelvic examination, including checking for microscopic changes to the surface of your cervix (Pap test). You may be encouraged to have this screening done every 3 years, beginning at age 21.  For women ages 30-65, health care providers may recommend pelvic exams and Pap testing every 3 years, or they may recommend the Pap and pelvic exam, combined with testing for human papilloma virus (HPV), every 5 years. Some types of HPV increase your risk of cervical cancer. Testing for HPV may also be done on women of any age with unclear Pap test results.  Other health care providers may not recommend any screening for nonpregnant women who are considered low risk for pelvic cancer and who do not have symptoms. Ask your health care provider if a screening pelvic exam is right for you.  If you have had past treatment for cervical cancer or a condition that could lead to cancer, you need Pap tests and screening for cancer for at least 20 years after your treatment. If Pap tests have been discontinued, your risk factors (such as having a new sexual partner) need to be reassessed to determine if screening should resume. Some women have medical problems that increase the chance of getting cervical cancer. In these cases, your health care provider may recommend more frequent screening and Pap tests. Colorectal Cancer  This type of cancer can be detected and often prevented.  Routine colorectal cancer screening usually begins at 76 years of age and continues through 75 years of age.  Your health care provider may recommend screening at an earlier age if you have risk factors for colon cancer.  Your health care provider may also recommend using home test kits to check for hidden blood in the stool.  A small camera at the end of a tube can be used to examine your colon directly (sigmoidoscopy or colonoscopy). This is done to check for the earliest forms of colorectal  cancer.  Routine screening usually begins at age 50.  Direct examination of the colon should be repeated every 5-10 years through 75 years of age. However, you may need to be screened more often if early forms of precancerous polyps or small growths are found. Skin Cancer  Check your skin from head to toe regularly.  Tell your health care provider about any new moles or changes in moles, especially if there is a change in a mole's shape or color.  Also tell your health care provider if you have a mole that is larger than the size of a pencil eraser.  Always use sunscreen. Apply sunscreen liberally and repeatedly throughout the day.  Protect yourself by wearing long sleeves, pants, a wide-brimmed hat, and sunglasses whenever you are outside. HEART DISEASE, DIABETES, AND HIGH BLOOD PRESSURE   High blood pressure causes heart disease and increases the risk of stroke. High blood pressure is more likely to develop in:  People who have blood pressure in the high end   of the normal range (130-139/85-89 mm Hg).  People who are overweight or obese.  People who are African American.  If you are 38-23 years of age, have your blood pressure checked every 3-5 years. If you are 61 years of age or older, have your blood pressure checked every year. You should have your blood pressure measured twice--once when you are at a hospital or clinic, and once when you are not at a hospital or clinic. Record the average of the two measurements. To check your blood pressure when you are not at a hospital or clinic, you can use:  An automated blood pressure machine at a pharmacy.  A home blood pressure monitor.  If you are between 45 years and 39 years old, ask your health care provider if you should take aspirin to prevent strokes.  Have regular diabetes screenings. This involves taking a blood sample to check your fasting blood sugar level.  If you are at a normal weight and have a low risk for diabetes,  have this test once every three years after 76 years of age.  If you are overweight and have a high risk for diabetes, consider being tested at a younger age or more often. PREVENTING INFECTION  Hepatitis B  If you have a higher risk for hepatitis B, you should be screened for this virus. You are considered at high risk for hepatitis B if:  You were born in a country where hepatitis B is common. Ask your health care provider which countries are considered high risk.  Your parents were born in a high-risk country, and you have not been immunized against hepatitis B (hepatitis B vaccine).  You have HIV or AIDS.  You use needles to inject street drugs.  You live with someone who has hepatitis B.  You have had sex with someone who has hepatitis B.  You get hemodialysis treatment.  You take certain medicines for conditions, including cancer, organ transplantation, and autoimmune conditions. Hepatitis C  Blood testing is recommended for:  Everyone born from [birth year] through 1967.  Anyone with known risk factors for hepatitis C. Sexually transmitted infections (STIs)  You should be screened for sexually transmitted infections (STIs) including gonorrhea and chlamydia if:  You are sexually active and are younger than 76 years of age.  You are older than 76 years of age and your health care provider tells you that you are at risk for this type of infection.  Your sexual activity has changed since you were last screened and you are at an increased risk for chlamydia or gonorrhea. Ask your health care provider if you are at risk.  If you do not have HIV, but are at risk, it may be recommended that you take a prescription medicine daily to prevent HIV infection. This is called pre-exposure prophylaxis (PrEP). You are considered at risk if:  You are sexually active and do not regularly use condoms or know the HIV status of your partner(s).  You take drugs by injection.  You are sexually  active with a partner who has HIV. Talk with your health care provider about whether you are at high risk of being infected with HIV. If you choose to begin PrEP, you should first be tested for HIV. You should then be tested every 3 months for as long as you are taking PrEP.  PREGNANCY   If you are premenopausal and you may become pregnant, ask your health care provider about preconception counseling.  If you may  become pregnant, take 400 to 800 micrograms (mcg) of folic acid every day.  If you want to prevent pregnancy, talk to your health care provider about birth control (contraception). OSTEOPOROSIS AND MENOPAUSE   Osteoporosis is a disease in which the bones lose minerals and strength with aging. This can result in serious bone fractures. Your risk for osteoporosis can be identified using a bone density scan.  If you are 38 years of age or older, or if you are at risk for osteoporosis and fractures, ask your health care provider if you should be screened.  Ask your health care provider whether you should take a calcium or vitamin D supplement to lower your risk for osteoporosis.  Menopause may have certain physical symptoms and risks.  Hormone replacement therapy may reduce some of these symptoms and risks. Talk to your health care provider about whether hormone replacement therapy is right for you.  HOME CARE INSTRUCTIONS   Schedule regular health, dental, and eye exams.  Stay current with your immunizations.   Do not use any tobacco products including cigarettes, chewing tobacco, or electronic cigarettes.  If you are pregnant, do not drink alcohol.  If you are breastfeeding, limit how much and how often you drink alcohol.  Limit alcohol intake to no more than 1 drink per day for nonpregnant women. One drink equals 12 ounces of beer, 5 ounces of wine, or 1 ounces of hard liquor.  Do not use street drugs.  Do not share needles.  Ask your health care provider for help if  you need support or information about quitting drugs.  Tell your health care provider if you often feel depressed.  Tell your health care provider if you have ever been abused or do not feel safe at home.   This information is not intended to replace advice given to you by your health care provider. Make sure you discuss any questions you have with your health care provider.   Document Released: 02/16/2011 Document Revised: 08/24/2014 Document Reviewed: 07/05/2013 Elsevier Interactive Patient Education [employer].

## 2015-06-24 ENCOUNTER — Telehealth: Payer: Self-pay | Admitting: Family Medicine

## 2015-06-24 DIAGNOSIS — E039 Hypothyroidism, unspecified: Secondary | ICD-10-CM

## 2015-06-24 MED ORDER — LEVOTHYROXINE SODIUM 125 MCG PO TABS: 125.0000 ug | ORAL_TABLET | Freq: Every day | ORAL | Status: DC

## 2015-06-24 NOTE — Telephone Encounter (Signed)
Reviewed lab results with patient.

## 2015-06-24 NOTE — Telephone Encounter (Signed)
Please call patient: - All of her labs are normal. I have called in refills on her Synthroid as requested.

## 2015-07-02 ENCOUNTER — Ambulatory Visit: Payer: No Typology Code available for payment source | Admitting: Physician Assistant

## 2015-07-08 ENCOUNTER — Ambulatory Visit: Payer: Medicare Other | Admitting: Internal Medicine

## 2015-07-16 ENCOUNTER — Ambulatory Visit: Payer: Medicare Other | Admitting: Nurse Practitioner

## 2015-08-09 ENCOUNTER — Emergency Department (HOSPITAL_BASED_OUTPATIENT_CLINIC_OR_DEPARTMENT_OTHER): Payer: Medicare Other

## 2015-08-09 ENCOUNTER — Inpatient Hospital Stay (HOSPITAL_BASED_OUTPATIENT_CLINIC_OR_DEPARTMENT_OTHER)
Admission: EM | Admit: 2015-08-09 | Discharge: 2015-08-11 | DRG: 069 | Disposition: A | Discharge status: Home / Self Care | Payer: Medicare Other | Attending: Internal Medicine | Admitting: Internal Medicine

## 2015-08-09 ENCOUNTER — Encounter (HOSPITAL_BASED_OUTPATIENT_CLINIC_OR_DEPARTMENT_OTHER): Payer: Self-pay | Admitting: *Deleted

## 2015-08-09 DIAGNOSIS — Z6827 Body mass index (BMI) 27.0-27.9, adult: Secondary | ICD-10-CM

## 2015-08-09 DIAGNOSIS — R531 Weakness: Secondary | ICD-10-CM | POA: Diagnosis not present

## 2015-08-09 DIAGNOSIS — K219 Gastro-esophageal reflux disease without esophagitis: Secondary | ICD-10-CM | POA: Diagnosis present

## 2015-08-09 DIAGNOSIS — G4700 Insomnia, unspecified: Secondary | ICD-10-CM | POA: Diagnosis present

## 2015-08-09 DIAGNOSIS — Z85850 Personal history of malignant neoplasm of thyroid: Secondary | ICD-10-CM | POA: Diagnosis not present

## 2015-08-09 DIAGNOSIS — E876 Hypokalemia: Secondary | ICD-10-CM | POA: Diagnosis present

## 2015-08-09 DIAGNOSIS — E890 Postprocedural hypothyroidism: Secondary | ICD-10-CM | POA: Diagnosis present

## 2015-08-09 DIAGNOSIS — G459 Transient cerebral ischemic attack, unspecified: Principal | ICD-10-CM | POA: Diagnosis present

## 2015-08-09 DIAGNOSIS — G451 Carotid artery syndrome (hemispheric): Secondary | ICD-10-CM | POA: Diagnosis not present

## 2015-08-09 DIAGNOSIS — M7989 Other specified soft tissue disorders: Secondary | ICD-10-CM | POA: Diagnosis not present

## 2015-08-09 DIAGNOSIS — R42 Dizziness and giddiness: Secondary | ICD-10-CM | POA: Diagnosis not present

## 2015-08-09 DIAGNOSIS — N183 Chronic kidney disease, stage 3 (moderate): Secondary | ICD-10-CM | POA: Diagnosis present

## 2015-08-09 DIAGNOSIS — E663 Overweight: Secondary | ICD-10-CM | POA: Diagnosis present

## 2015-08-09 DIAGNOSIS — I10 Essential (primary) hypertension: Secondary | ICD-10-CM | POA: Diagnosis not present

## 2015-08-09 DIAGNOSIS — E039 Hypothyroidism, unspecified: Secondary | ICD-10-CM | POA: Diagnosis not present

## 2015-08-09 DIAGNOSIS — M6289 Other specified disorders of muscle: Secondary | ICD-10-CM

## 2015-08-09 DIAGNOSIS — Z8673 Personal history of transient ischemic attack (TIA), and cerebral infarction without residual deficits: Secondary | ICD-10-CM

## 2015-08-09 DIAGNOSIS — Z79899 Other long term (current) drug therapy: Secondary | ICD-10-CM

## 2015-08-09 DIAGNOSIS — Z7982 Long term (current) use of aspirin: Secondary | ICD-10-CM | POA: Diagnosis not present

## 2015-08-09 DIAGNOSIS — R200 Anesthesia of skin: Secondary | ICD-10-CM | POA: Diagnosis present

## 2015-08-09 DIAGNOSIS — E785 Hyperlipidemia, unspecified: Secondary | ICD-10-CM | POA: Diagnosis not present

## 2015-08-09 DIAGNOSIS — Z87891 Personal history of nicotine dependence: Secondary | ICD-10-CM

## 2015-08-09 DIAGNOSIS — Z880 Allergy status to penicillin: Secondary | ICD-10-CM

## 2015-08-09 DIAGNOSIS — F329 Major depressive disorder, single episode, unspecified: Secondary | ICD-10-CM | POA: Diagnosis present

## 2015-08-09 DIAGNOSIS — I2510 Atherosclerotic heart disease of native coronary artery without angina pectoris: Secondary | ICD-10-CM | POA: Diagnosis present

## 2015-08-09 DIAGNOSIS — J439 Emphysema, unspecified: Secondary | ICD-10-CM | POA: Diagnosis not present

## 2015-08-09 DIAGNOSIS — H538 Other visual disturbances: Secondary | ICD-10-CM | POA: Diagnosis present

## 2015-08-09 DIAGNOSIS — I129 Hypertensive chronic kidney disease with stage 1 through stage 4 chronic kidney disease, or unspecified chronic kidney disease: Secondary | ICD-10-CM | POA: Diagnosis present

## 2015-08-09 LAB — CBC
HEMATOCRIT: 36.8 % (ref 36.0–46.0)
HEMOGLOBIN: 12.2 g/dL (ref 12.0–15.0)
MCH: 29 pg (ref 26.0–34.0)
MCHC: 33.2 g/dL (ref 30.0–36.0)
MCV: 87.6 fL (ref 78.0–100.0)
Platelets: 214 10*3/uL (ref 150–400)
RBC: 4.2 MIL/uL (ref 3.87–5.11)
RDW: 12.4 % (ref 11.5–15.5)
WBC: 8.2 10*3/uL (ref 4.0–10.5)

## 2015-08-09 LAB — COMPREHENSIVE METABOLIC PANEL
ALBUMIN: 3.9 g/dL (ref 3.5–5.0)
ALT: 18 U/L (ref 14–54)
AST: 24 U/L (ref 15–41)
Alkaline Phosphatase: 83 U/L (ref 38–126)
Anion gap: 8 (ref 5–15)
BILIRUBIN TOTAL: 0.6 mg/dL (ref 0.3–1.2)
BUN: 31 mg/dL — AB (ref 6–20)
CO2: 26 mmol/L (ref 22–32)
Calcium: 9.2 mg/dL (ref 8.9–10.3)
Chloride: 105 mmol/L (ref 101–111)
Creatinine, Ser: 1.34 mg/dL — ABNORMAL HIGH (ref 0.44–1.00)
GFR calc Af Amer: 43 mL/min — ABNORMAL LOW (ref >60)
GFR calc non Af Amer: 37 mL/min — ABNORMAL LOW (ref >60)
GLUCOSE: 118 mg/dL — AB (ref 65–99)
POTASSIUM: 3.3 mmol/L — AB (ref 3.5–5.1)
Sodium: 139 mmol/L (ref 135–145)
TOTAL PROTEIN: 7.8 g/dL (ref 6.5–8.1)

## 2015-08-09 LAB — TROPONIN I

## 2015-08-09 LAB — CBG MONITORING, ED: Glucose-Capillary: 143 mg/dL — ABNORMAL HIGH (ref 65–99)

## 2015-08-09 MED ORDER — ASPIRIN 81 MG PO CHEW: 324.0000 mg | CHEWABLE_TABLET | Freq: Once | ORAL | Status: DC

## 2015-08-09 NOTE — ED Notes (Signed)
Left message with husband as to pt's bed number

## 2015-08-09 NOTE — ED Notes (Signed)
Pt is currently not having any symptoms or any weakness and her neurological exam is normal.  She has had a significant history of cardiac blockages and also admits to a hx of TIA.  No acute distress at this time, family at the bedside and cardiac monitor on with NSR showing.

## 2015-08-09 NOTE — ED Notes (Signed)
Patient transported to CT 

## 2015-08-09 NOTE — Progress Notes (Signed)
Accepted patient from [facility] from [place] . Patient is a 74 year old female with past medical history of hypertension, CAD, TIA 2;  who presents with complaints of intermittent right sided weakness and subsequent resolution of symptoms. Question of possible TIA admit for observation to a neuro telemetry bed. Patient mildly hypertensive. Dr. Wilson Singer to patient neurology regarding patient prior to transfer.

## 2015-08-09 NOTE — ED Provider Notes (Signed)
CSN: [id number]     Arrival date & time 08/09/15  1933 History  By signing my name below, I, Soijett Blue, attest that this documentation has been prepared under the direction and in the presence of Virgel Manifold, MD. Electronically Signed: Soijett Blue, ED Scribe. 08/09/2015. 9:05 PM.   Chief Complaint  Patient presents with  . Extremity Weakness      The history is provided by the patient. No language interpreter was used.    Colleen Wright is a 74 y.o. female with a medical hx of HTN, CAD, TIA x 2, who presents to the Emergency Department complaining of intermittent extremity weakness onset tonight PTA with 2 episodes lasting 20-30 minutes. She notes that she has had these symptoms before where her right leg will become weak and her right arm will be weak with associated numbness. She denies having the weakness or numbness currently while in the ED. She notes that the last time that she had these symptoms she had a 95% blockage 9 years ago and she is unsure where her weakness was localized to at that time. Pt is having associated symptoms of blurred vision and HA x yesterday. She notes that she has not tried any medications for the relief of her symptoms. She denies difficulty with speech, difficulty breathing, and any other symptoms.    Pt PCP: Howard Pouch, DO at [facility]   Past Medical History  Diagnosis Date  . Colon polyps   . Allergy   . Arthritis     low back  . Cancer [facility])     thyroid, s/p thyroidectomy 8 years ago  . Depression   . GERD (gastroesophageal reflux disease)   . Hyperlipidemia   . Hypertension   . Ulcer   . Hypothyroidism 06/12/2011  . Hiatal hernia   . CAD (coronary artery disease) 06/12/2011  . Back pain 06/13/2011  . Vertigo 06/13/2011  . Leg fracture   . Thyroid cancer ([place])   . Skin lesions, generalized 05/23/2012  . Pain of left heel 05/23/2012  . Pain in joint, ankle and foot 11/02/2012   Past Surgical History  Procedure Laterality Date  .  Thyroidectomy, partial      twice  . Fracture surgery      left leg, pin and 5 bolts in place in lower tibia  . Tubal ligation      growth removed from posterior vaginal vault  . Lesion removal      vaginal  . Balloon angioplasty, artery     Family History  Problem Relation Age of Onset  . Cancer Mother     ovary/uterus-can't remember  . Stroke Mother   . Heart disease Father   . Heart disease Sister     bypass  . Stroke Sister   . Cancer Sister     LN, bone  . Hypertension Sister   . Heart disease Brother     bypass  . Cancer Cousin     breast   Social History  Substance Use Topics  . Smoking status: Former Research scientist (life sciences)  . Smokeless tobacco: Never Used  . Alcohol Use: No   OB History    No data available     Review of Systems  All other systems reviewed and are negative.     Allergies  Amoxicillin  Home Medications   Prior to Admission medications   Medication Sig Start Date End Date Taking? Authorizing Provider  ALPRAZolam Duanne Moron) 1 MG tablet Take 1 tablet (1 mg total)  by mouth at bedtime as needed. 06/21/15   Renee A Kuneff, DO  aspirin 325 MG tablet Take 325 mg by mouth daily. Patient used this medication for dizziness.    Historical Provider, MD  Cholecalciferol (VITAMIN D3) 50000 UNITS CAPS Take 1 capsule by mouth every 7 (seven) days. 01/16/15   Irene Pap, NP  levothyroxine (SYNTHROID, LEVOTHROID) 125 MCG tablet Take 1 tablet (125 mcg total) by mouth daily. 06/24/15   Renee A Kuneff, DO  losartan-hydrochlorothiazide (HYZAAR) 50-12.5 MG per tablet Take 1 tablet by mouth daily. 03/18/15   Renee A Kuneff, DO  nitroGLYCERIN (NITROSTAT) 0.4 MG SL tablet Place 1 tablet (0.4 mg total) under the tongue every 5 (five) minutes x 3 doses as needed for chest pain. 02/08/12 09/13/14  Roger A Arguello, PA-C  zoster vaccine live, PF, (ZOSTAVAX) 60454 UNT/0.65ML injection Inject 19,400 Units into the skin once. 06/21/15   Renee A Kuneff, DO   BP 130/56 mmHg  Pulse 67   Temp(Src) 97.8 F (36.6 C) (Oral)  Resp 17  Wt 145 lb (65.772 kg)  SpO2 100% Physical Exam  Constitutional: She is oriented to person, place, and time. She appears well-developed and well-nourished. No distress.  HENT:  Head: Normocephalic and atraumatic.  Right Ear: Hearing normal.  Left Ear: Hearing normal.  Nose: Nose normal.  Mouth/Throat: Oropharynx is clear and moist and mucous membranes are normal.  Eyes: Conjunctivae and EOM are normal. Pupils are equal, round, and reactive to light.  Neck: Normal range of motion. Neck supple.  Cardiovascular: Normal rate, regular rhythm, S1 normal, S2 normal and normal heart sounds.  Exam reveals no gallop and no friction rub.   No murmur heard. Pulmonary/Chest: Effort normal and breath sounds normal. No respiratory distress. She exhibits no tenderness.  Abdominal: Soft. Normal appearance and bowel sounds are normal. There is no hepatosplenomegaly. There is no tenderness. There is no rebound, no guarding, no tenderness at McBurney's point and negative Murphy's sign. No hernia.  Musculoskeletal: Normal range of motion.  Neurological: She is alert and oriented to person, place, and time. She has normal strength. No cranial nerve deficit or sensory deficit. Coordination normal. GCS eye subscore is 4. GCS verbal subscore is 5. GCS motor subscore is 6.  Good finger to nose bilaterally.   Skin: Skin is warm, dry and intact. No rash noted. No cyanosis.  Psychiatric: She has a normal mood and affect. Her speech is normal and behavior is normal. Thought content normal.  Nursing note and vitals reviewed.   ED Course  Procedures (including critical care time) DIAGNOSTIC STUDIES: Oxygen Saturation is 100% on RA, nl by my interpretation.    COORDINATION OF CARE: 9:05 PM Discussed treatment plan with pt at bedside which includes labs, CT head without contrast, EKG and pt agreed to plan.    Labs Review Labs Reviewed  COMPREHENSIVE METABOLIC PANEL -  Abnormal; Notable for the following:    Potassium 3.3 (*)    Glucose, Bld 118 (*)    BUN 31 (*)    Creatinine, Ser 1.34 (*)    GFR calc non Af Amer 37 (*)    GFR calc Af Amer 43 (*)    All other components within normal limits  CBG MONITORING, ED - Abnormal; Notable for the following:    Glucose-Capillary 143 (*)    All other components within normal limits  TROPONIN I  CBC    Imaging Review Ct Head Wo Contrast  08/09/2015  CLINICAL DATA:  74 year old  female with right sided weakness. EXAM: CT HEAD WITHOUT CONTRAST TECHNIQUE: Contiguous axial images were obtained from the base of the skull through the vertex without intravenous contrast. COMPARISON:  MRI dated 03/29/2015 and CT dated 02/07/2012 FINDINGS: The ventricles are dilated and the sulci are prominent compatible with age-related atrophy. Periventricular and deep white matter hypodensities represent chronic microvascular ischemic changes. There is no intracranial hemorrhage. No mass effect or midline shift identified. There is diffuse mucoperiosteal thickening of right maxillary sinus. The remainder of the visualized paranasal sinuses and mastoid air cells are well aerated. The calvarium is intact. IMPRESSION: No acute intracranial hemorrhage. Age-related atrophy and chronic microvascular ischemic disease. If symptoms persist and there are no contraindications, MRI may provide better evaluation if clinically indicated. Electronically Signed   By: Anner Crete M.D.   On: 08/09/2015 20:07   I have personally reviewed and evaluated these images and lab results as part of my medical decision-making.   EKG Interpretation   Date/Time:  Friday August 09 2015 19:45:47 EST Ventricular Rate:  74 PR Interval:  148 QRS Duration: 80 QT Interval:  388 QTC Calculation: 430 R Axis:   22 Text Interpretation:  Normal sinus rhythm Cannot rule out Anterior infarct  , age undetermined Abnormal ECG No significant change since last tracing   Confirmed by [facility]  MD, MARTHA [phone]) on 08/09/2015 8:33:16 PM      MDM   Final diagnoses:  Transient cerebral ischemia, unspecified transient cerebral ischemia type    74yF with symptoms concerning for possible TIA. Seen by neurology in August and unremarkable MR brain then. Says was having "dizzy spells" then. No lateralizing symptoms as she has been having since yesterday. Nonfocla neuro exam. CT head w/o acute abnormality.   I personally preformed the services scribed in my presence. The recorded information has been reviewed is accurate. Virgel Manifold, MD.    Virgel Manifold, MD 08/12/15 [phone]

## 2015-08-09 NOTE — ED Notes (Signed)
Left another message with pt's husband to let him know pt was transferred.

## 2015-08-09 NOTE — ED Notes (Signed)
Pt transferred to [facility] via [facility] 

## 2015-08-09 NOTE — ED Notes (Signed)
Pt c/o right arm and right leg weakness x 10 hrs ago,  off and on today x 2 episodes.

## 2015-08-09 NOTE — ED Notes (Signed)
Pt's husband is going home, his phone number [phone], and granddaughter's number, [place], [phone]

## 2015-08-10 ENCOUNTER — Observation Stay (HOSPITAL_BASED_OUTPATIENT_CLINIC_OR_DEPARTMENT_OTHER): Payer: Medicare Other

## 2015-08-10 ENCOUNTER — Encounter (HOSPITAL_COMMUNITY): Payer: Self-pay | Admitting: General Practice

## 2015-08-10 ENCOUNTER — Observation Stay (HOSPITAL_COMMUNITY)
Admit: 2015-08-10 | Discharge: 2015-08-10 | Disposition: A | Discharge status: Home / Self Care | Payer: Medicare Other | Attending: Internal Medicine | Admitting: Internal Medicine

## 2015-08-10 ENCOUNTER — Observation Stay (HOSPITAL_COMMUNITY): Payer: Medicare Other

## 2015-08-10 DIAGNOSIS — R531 Weakness: Secondary | ICD-10-CM | POA: Diagnosis present

## 2015-08-10 DIAGNOSIS — G451 Carotid artery syndrome (hemispheric): Secondary | ICD-10-CM | POA: Diagnosis not present

## 2015-08-10 DIAGNOSIS — M7989 Other specified soft tissue disorders: Secondary | ICD-10-CM

## 2015-08-10 DIAGNOSIS — G4700 Insomnia, unspecified: Secondary | ICD-10-CM | POA: Diagnosis present

## 2015-08-10 DIAGNOSIS — G459 Transient cerebral ischemic attack, unspecified: Secondary | ICD-10-CM

## 2015-08-10 DIAGNOSIS — I10 Essential (primary) hypertension: Secondary | ICD-10-CM | POA: Diagnosis not present

## 2015-08-10 DIAGNOSIS — E039 Hypothyroidism, unspecified: Secondary | ICD-10-CM

## 2015-08-10 DIAGNOSIS — E876 Hypokalemia: Secondary | ICD-10-CM | POA: Diagnosis present

## 2015-08-10 DIAGNOSIS — R42 Dizziness and giddiness: Secondary | ICD-10-CM | POA: Diagnosis not present

## 2015-08-10 DIAGNOSIS — E785 Hyperlipidemia, unspecified: Secondary | ICD-10-CM

## 2015-08-10 DIAGNOSIS — J439 Emphysema, unspecified: Secondary | ICD-10-CM | POA: Diagnosis not present

## 2015-08-10 DIAGNOSIS — M6289 Other specified disorders of muscle: Secondary | ICD-10-CM | POA: Diagnosis not present

## 2015-08-10 LAB — LIPID PANEL
CHOL/HDL RATIO: 3.8 ratio
CHOLESTEROL: 182 mg/dL (ref 0–200)
HDL: 48 mg/dL (ref >40)
LDL Cholesterol: 124 mg/dL — ABNORMAL HIGH (ref 0–99)
TRIGLYCERIDES: 50 mg/dL (ref <150)
VLDL: 10 mg/dL (ref 0–40)

## 2015-08-10 MED ORDER — POTASSIUM CHLORIDE 20 MEQ/15ML (10%) PO SOLN
20.0000 meq | Freq: Once | ORAL | Status: AC
  Administered 2015-08-10: 20 meq via ORAL
  Filled 2015-08-10: qty 15

## 2015-08-10 MED ORDER — HYDROCHLOROTHIAZIDE 12.5 MG PO CAPS
12.5000 mg | ORAL_CAPSULE | Freq: Every day | ORAL | Status: DC
  Administered 2015-08-10: 12.5 mg via ORAL
  Filled 2015-08-10 (×2): qty 1

## 2015-08-10 MED ORDER — NITROGLYCERIN 0.4 MG SL SUBL: 0.4000 mg | SUBLINGUAL_TABLET | SUBLINGUAL | Status: DC | PRN

## 2015-08-10 MED ORDER — STROKE: EARLY STAGES OF RECOVERY BOOK
Freq: Once | Status: AC
  Administered 2015-08-10

## 2015-08-10 MED ORDER — SENNOSIDES-DOCUSATE SODIUM 8.6-50 MG PO TABS: 1.0000 | ORAL_TABLET | Freq: Every evening | ORAL | Status: DC | PRN

## 2015-08-10 MED ORDER — ENOXAPARIN SODIUM 40 MG/0.4ML ~~LOC~~ SOLN
40.0000 mg | Freq: Every day | SUBCUTANEOUS | Status: DC
  Administered 2015-08-10: 40 mg via SUBCUTANEOUS
  Filled 2015-08-10 (×2): qty 0.4

## 2015-08-10 MED ORDER — ALPRAZOLAM 0.5 MG PO TABS
1.0000 mg | ORAL_TABLET | Freq: Every evening | ORAL | Status: DC | PRN
  Administered 2015-08-10 (×2): 1 mg via ORAL
  Filled 2015-08-10 (×2): qty 2

## 2015-08-10 MED ORDER — LEVOTHYROXINE SODIUM 25 MCG PO TABS
125.0000 ug | ORAL_TABLET | Freq: Every day | ORAL | Status: DC
  Administered 2015-08-10 (×2): 125 ug via ORAL
  Filled 2015-08-10 (×2): qty 1

## 2015-08-10 MED ORDER — ATORVASTATIN CALCIUM 40 MG PO TABS
40.0000 mg | ORAL_TABLET | Freq: Every day | ORAL | Status: DC
  Administered 2015-08-10: 40 mg via ORAL
  Filled 2015-08-10: qty 1

## 2015-08-10 MED ORDER — ASPIRIN 325 MG PO TABS
325.0000 mg | ORAL_TABLET | Freq: Every day | ORAL | Status: DC
  Administered 2015-08-10: 325 mg via ORAL
  Filled 2015-08-10 (×2): qty 1

## 2015-08-10 MED ORDER — ZOSTER VACCINE LIVE 19400 UNT/0.65ML ~~LOC~~ SOLR: 0.6500 mL | Freq: Once | SUBCUTANEOUS | Status: DC

## 2015-08-10 MED ORDER — ACETAMINOPHEN 650 MG RE SUPP: 650.0000 mg | RECTAL | Status: DC | PRN

## 2015-08-10 MED ORDER — LOSARTAN POTASSIUM-HCTZ 50-12.5 MG PO TABS: 1.0000 | ORAL_TABLET | Freq: Every day | ORAL | Status: DC

## 2015-08-10 MED ORDER — LOSARTAN POTASSIUM 50 MG PO TABS
50.0000 mg | ORAL_TABLET | Freq: Every day | ORAL | Status: DC
  Administered 2015-08-10: 50 mg via ORAL
  Filled 2015-08-10 (×2): qty 1

## 2015-08-10 MED ORDER — SODIUM CHLORIDE 0.9 % IV SOLN: INTRAVENOUS | Status: DC

## 2015-08-10 MED ORDER — LEVOTHYROXINE SODIUM 25 MCG PO TABS: 125.0000 ug | ORAL_TABLET | Freq: Every day | ORAL | Status: DC

## 2015-08-10 MED ORDER — ACETAMINOPHEN 325 MG PO TABS: 650.0000 mg | ORAL_TABLET | ORAL | Status: DC | PRN

## 2015-08-10 MED ORDER — VITAMIN D (ERGOCALCIFEROL) 1.25 MG (50000 UNIT) PO CAPS: 50000.0000 [IU] | ORAL_CAPSULE | ORAL | Status: DC

## 2015-08-10 MED ORDER — HYDRALAZINE HCL 20 MG/ML IJ SOLN: 10.0000 mg | INTRAMUSCULAR | Status: DC | PRN

## 2015-08-10 NOTE — Progress Notes (Signed)
  Echocardiogram 2D Echocardiogram has been performed.  Colleen Wright 08/10/2015, 10:18 AM

## 2015-08-10 NOTE — Progress Notes (Signed)
VASCULAR LAB PRELIMINARY  PRELIMINARY  PRELIMINARY  PRELIMINARY  Carotid duplex completed.    Preliminary report:  1-39% right ICA stenosis.  40-59% left ICA stenosis, highest end of range.  Vertebral artery flow is antegrade.   Wave Calzada, RVT 08/10/2015, 6:24 PM

## 2015-08-10 NOTE — Evaluation (Signed)
Occupational Therapy Evaluation and Discharge Patient Details Name: Colleen Wright MRN: [id number] DOB: [birth year] Today's Date: 08/10/2015    History of Present Illness Patient is a 74 year old female with past medical history of hypertension, CAD, TIA 2; who presents with complaints of intermittent right sided weakness and subsequent resolution of symptoms   Clinical Impression   This 74 yo female admitted with above presents to acute OT at a S level due to intermittent right sided weakness--pt has this from her husband. No further OT needs and pt does not feel like she needs PT (I did ambulate with pt and had her do steps)--PT made aware.    Follow Up Recommendations  No OT follow up, S with up and about   Equipment Recommendations  None recommended by OT       Precautions / Restrictions Precautions Precautions: Fall (due to intermittent weakness of RLE) Restrictions Weight Bearing Restrictions: No      Mobility Bed Mobility Overal bed mobility: Independent                Transfers Overall transfer level: Needs assistance               General transfer comment: S only due to intermittent RLE weakness. Pt ambulated without AD 300 feet and up/down a flight of steps with one rail--S.    Balance Overall balance assessment: History of Falls;Needs assistance Sitting-balance support: No upper extremity supported;Feet supported Sitting balance-Leahy Scale: Normal     Standing balance support: No upper extremity supported Standing balance-Leahy Scale: Good                              ADL                                         General ADL Comments: Overall at a S level due to intermittent weakness of right side. Did discuss with pt and husband recommendation of shower seat due to pt's intermittent weakness and safety in shower     Vision Vision Assessment?: Yes Eye Alignment: Within Functional Limits Ocular Range of  Motion: Within Functional Limits Alignment/Gaze Preference: Within Defined Limits Tracking/Visual Pursuits: Able to track stimulus in all quads without difficulty Saccades:  (decreased memory for task) Convergence: Within functional limits Visual Fields: No apparent deficits          Pertinent Vitals/Pain Pain Assessment: No/denies pain     Hand Dominance Right   Extremity/Trunk Assessment Upper Extremity Assessment Upper Extremity Assessment: Overall WFL for tasks assessed   Lower Extremity Assessment Lower Extremity Assessment: Overall WFL for tasks assessed       Communication Communication Communication: No difficulties   Cognition Arousal/Alertness: Awake/alert Behavior During Therapy: WFL for tasks assessed/performed Overall Cognitive Status: Within Functional Limits for tasks assessed                                Home Living Family/patient expects to be discharged to:: Private residence Living Arrangements: Spouse/significant other Available Help at Discharge: Family Type of Home: House Home Access: Level entry     Home Layout: Two level;Bed/bath upstairs Alternate Level Stairs-Number of Steps: 12 Alternate Level Stairs-Rails: Right Bathroom Shower/Tub: Occupational psychologist: Standard     Home Equipment: None  Prior Functioning/Environment Level of Independence: Independent             OT Diagnosis: Generalized weakness         OT Goals(Current goals can be found in the care plan section) Acute Rehab OT Goals Patient Stated Goal: home today  OT Frequency:                End of Session Equipment Utilized During Treatment: Gait belt Nurse Communication: Mobility status (chair alarm on)  Activity Tolerance: Patient tolerated treatment well Patient left: in chair;with call bell/phone within reach;with chair alarm set;with family/visitor present   Time: [id number] OT Time Calculation (min): 29  min Charges:  OT General Charges $OT Visit: 1 Procedure OT Evaluation $Initial OT Evaluation Tier I: 1 Procedure OT Treatments $Self Care/Home Management : 8-22 mins G-Codes: OT G-codes **NOT FOR INPATIENT CLASS** Functional Assessment Tool Used: Clinical observation Functional Limitation: Self care Self Care Current Status [id number]): At least 1 percent but less than 20 percent impaired, limited or restricted Self Care Goal Status [id number]): At least 1 percent but less than 20 percent impaired, limited or restricted Self Care Discharge Status [phone]): At least 1 percent but less than 20 percent impaired, limited or restricted  Almon Register [id number] 08/10/2015, 9:25 AM

## 2015-08-10 NOTE — Progress Notes (Signed)
STROKE TEAM PROGRESS NOTE   HISTORY Colleen Wright is an 74 y.o. female hx of HTN, CAD, TIA presenting to ED with recurrent episodes of right sided weakness lasting 20-30 minutes. Reports 2 episodes prior to arrival to the ED. She also reports intermittent blurred vision occuring over the past few days. Denies any speech deficits. Notes being worked up for TIA in the past, unsure what her symptoms were at that time. She takes an ASA 325mg  daily at home though she reports not taking it recently.   CT head imaging reviewed, no acute process.   Date last known well: 08/09/2015 Time last known well: unclear tPA Given: no, symptoms resolved Modified Rankin: Rankin Score=1   SUBJECTIVE (INTERVAL HISTORY) Patient feels well. Deficits resolved. Had 3 episodes that lasted 20 - 30 minutes. She sits down and it resolves. Right arm weak. She has been haing episodes for a year or longer. Right leg and right arm get numb.    OBJECTIVE Temp:  [97.5 F (36.4 C)-98.1 F (36.7 C)] 97.6 F (36.4 C) (12/24 0930) Pulse Rate:  [61-80] 61 (12/24 0930) Cardiac Rhythm:  [-] Normal sinus rhythm (12/24 0700) Resp:  [15-21] 18 (12/24 0930) BP: (119-185)/(47-98) 131/57 mmHg (12/24 0930) SpO2:  [99 %-100 %] 100 % (12/24 0930) Weight:  [65.772 kg (145 lb)-67.132 kg (148 lb)] 67.132 kg (148 lb) (12/24 0008)  CBC:  Recent Labs Lab 08/09/15 2010  WBC 8.2  HGB 12.2  HCT 36.8  MCV 87.6  PLT [id number]    Basic Metabolic Panel:  Recent Labs Lab 08/09/15 2010  NA 139  K 3.3*  CL 105  CO2 26  GLUCOSE 118*  BUN 31*  CREATININE 1.34*  CALCIUM 9.2    Lipid Panel:    Component Value Date/Time   CHOL 182 08/10/2015 0252   TRIG 50 08/10/2015 0252   HDL 48 08/10/2015 0252   CHOLHDL 3.8 08/10/2015 0252   VLDL 10 08/10/2015 0252   LDLCALC 124* 08/10/2015 0252   HgbA1c: No results found for: HGBA1C Urine Drug Screen: No results found for: LABOPIA, COCAINSCRNUR, LABBENZ, AMPHETMU, THCU, LABBARB     IMAGING  Ct Head Wo Contrast 07/19/2015   No acute intracranial hemorrhage. Age-related atrophy and chronic microvascular ischemic disease. If symptoms persist and there are no contraindications, MRI may provide better evaluation if clinically indicated.    Mr Brain Wo Contrast 08/10/2015    MRI HEAD  1. No acute intracranial infarct or other process identified.  2. 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  3. Age-related cerebral atrophy with moderate chronic microvascular ischemic disease.   MRA HEAD 1. No large or proximal arterial branch occlusion within the intracranial circulation. No correctable stenosis.  2. Short-segment moderate stenosis within a proximal left M2 branch, with additional short-segment mild stenosis within the proximal right M1 segment. Anterior circulation otherwise widely patent.  3. Short-segment severe stenoses within the mid -distal P2 segments bilaterally.      PHYSICAL EXAM  Physical Exam  Constitutional: He appears well-developed and well-nourished.  Psych: Affect appropriate to situation Eyes: No scleral injection HENT: No OP obstrucion Head: Normocephalic.  Cardiovascular: Normal rate and regular rhythm.  Respiratory: Effort normal and breath sounds normal.   Neurologic Examination: Mental Status: Alert, oriented, thought content appropriate. Speech fluent without evidence of aphasia. Able to follow 3 step commands without difficulty. Cranial Nerves: II: optic discs not visualized, visual fields grossly normal, pupils equal, round, reactive  III,IV, VI:  ptosis not present, extra-ocular motions intact bilaterally V,VII: smile symmetric, facial light touch sensation normal bilaterally VIII: hearing normal bilaterally IX,X: gag reflex present XI: trapezius strength/neck flexion strength normal bilaterally XII: tongue strength normal  Motor: Right :Upper  extremityLeft: Upper extremity 5/5 deltoid 5/5 deltoid 5/5 biceps5/5 biceps  5/5 triceps5/5 triceps 5/5 hand grip5/5 hand grip Lower extremityLower extremity 5/5 hip flexor5/5 hip flexor 5/5 quadricep5/5 quadriceps  5/5 hamstrings5/5 hamstrings 5/5 plantar flexion 5/5 plantar flexion 5/5 plantar extension5/5 plantar extension Tone and bulk:normal tone throughout; no atrophy noted Sensory: Pinprick and light touch intact throughout, bilaterally Deep Tendon Reflexes: 2+ and symmetric throughout Plantars: Right: downgoingLeft: downgoing Cerebellar: normal finger-to-nose, and normal heel-to-shin test Gait: deferred      ASSESSMENT/PLAN [name] is a 74 y.o. female with history of hx of HTN, CAD, TIA presenting to ED with recurrent episodes of right sided weakness over the last year lasting 20-30 minutes. She did not receive IV t-PA due to symptom resolution   Unclear etiology of episodic stereotyped right-sided deficits for over a year,  T TIA vs seizure activity or possibly hypoperfusion  MRI  No acute intracranial infarct or other process identified.    Remote lacunar infarcts involving the left basal ganglia and left corona  radiata. age-related cerebral atrophy with moderate chronic  microvascular  ischemic disease.             MRA  No large or proximal arterial branch occlusion within the intracranial  circulation. No correctable stenosis. Short-segment moderate stenosis within  a proximal left M2 branch, with additional short-segment mild stenosis within  the proximal right M1 segment. Anterior circulation otherwise widely patent.  Short-segment severe stenoses within the mid -distal P2 segments  bilaterally.   Carotid Doppler  pending  2D Echo  Systolic function was normal. The  estimated ejection fraction was in the range of 55% to 60%. Wall  motion was normal; there were no regional wall motion  abnormalities.  LDL 124  HgbA1c pending  Lovenox for VTE prophylaxis  Diet Heart Room service appropriate?: Yes; Fluid consistency:: Thin  No anticoagulants prior to admission, now on ASA 325  Patient counseled to be compliant with her antithrombotic medications  Ongoing aggressive stroke risk factor management  Therapy recommendations:  home  Disposition:  home  Hypertension  controlled  Hyperlipidemia  Home meds:   resumed in hospital  LDL 124, goal < 70  Add statin Lipitor 10mg   Continue statin at discharge  Diabetes  HgbA1c pending, goal < 7.0  Plan: EEG pending Neurology follow up outpatient  Other Stroke Risk Factors  Advanced age  Overweight  Body mass index is 27.06 kg/(m^2).   Hx stroke/TIA  Coronary artery disease    Personally examined patient and images, and have participated in and made any corrections needed to history, physical, neuro exam,assessment and plan as stated above.  I have personally obtained the history, evaluated lab date, reviewed imaging studies and agree with radiology interpretations.    Sarina Ill, MD Stroke Neurology [phone] [facility]     To contact Stroke Continuity provider, please refer to [email or web address] After hours, contact General Neurology

## 2015-08-10 NOTE — Consult Note (Signed)
Stroke Consult Consulting Physician: Dr Wilson Singer ED  Chief Complaint: right sided weakness HPI: Colleen Wright is an 74 y.o. female hx of HTN, CAD, TIA presenting to ED with recurrent episodes of right sided weakness lasting 20-30 minutes. Reports 2 episodes prior to arrival to the ED. She also reports intermittent blurred vision occuring over the past few days. Denies any speech deficits. Notes being worked up for TIA in the past, unsure what her symptoms were at that time. She takes an ASA 325mg  daily at home though she reports not taking it recently.   CT head imaging reviewed, no acute process.   Date last known well: 08/09/2015 Time last known well: unclear tPA Given: no, symptoms resolved Modified Rankin: Rankin Score=1  Past Medical History  Diagnosis Date  . Colon polyps   . Allergy   . Arthritis     low back  . Cancer [facility])     thyroid, s/p thyroidectomy 8 years ago  . Depression   . GERD (gastroesophageal reflux disease)   . Hyperlipidemia   . Hypertension   . Ulcer   . Hypothyroidism 06/12/2011  . Hiatal hernia   . CAD (coronary artery disease) 06/12/2011  . Back pain 06/13/2011  . Vertigo 06/13/2011  . Leg fracture   . Thyroid cancer ([place])   . Skin lesions, generalized 05/23/2012  . Pain of left heel 05/23/2012  . Pain in joint, ankle and foot 11/02/2012    Past Surgical History  Procedure Laterality Date  . Thyroidectomy, partial      twice  . Fracture surgery      left leg, pin and 5 bolts in place in lower tibia  . Tubal ligation      growth removed from posterior vaginal vault  . Lesion removal      vaginal  . Balloon angioplasty, artery      Family History  Problem Relation Age of Onset  . Cancer Mother     ovary/uterus-can't remember  . Stroke Mother   . Heart disease Father   . Heart disease Sister     bypass  . Stroke Sister   . Cancer Sister     LN, bone  . Hypertension Sister   . Heart disease Brother     bypass  . Cancer Cousin      breast   Social History:  reports that she has quit smoking. She has never used smokeless tobacco. She reports that she does not drink alcohol or use illicit drugs.  Allergies:  Allergies  Allergen Reactions  . Amoxicillin Other (See Comments)    Patient develops a very bad yeast infection.    Medications Prior to Admission  Medication Sig Dispense Refill  . ALPRAZolam (XANAX) 1 MG tablet Take 1 tablet (1 mg total) by mouth at bedtime as needed. 30 tablet 1  . aspirin 325 MG tablet Take 325 mg by mouth daily. Patient used this medication for dizziness.    . Cholecalciferol (VITAMIN D3) 50000 UNITS CAPS Take 1 capsule by mouth every 7 (seven) days. 12 capsule 0  . levothyroxine (SYNTHROID, LEVOTHROID) 125 MCG tablet Take 1 tablet (125 mcg total) by mouth daily. 90 tablet 3  . losartan-hydrochlorothiazide (HYZAAR) 50-12.5 MG per tablet Take 1 tablet by mouth daily. 90 tablet 1  . nitroGLYCERIN (NITROSTAT) 0.4 MG SL tablet Place 1 tablet (0.4 mg total) under the tongue every 5 (five) minutes x 3 doses as needed for chest pain. 25 tablet 3  . zoster vaccine  live, PF, (ZOSTAVAX) 16109 UNT/0.65ML injection Inject 19,400 Units into the skin once. 1 each 0    ROS: Out of a complete 14 system review, the patient complains of only the following symptoms, and all other reviewed systems are negative. +weakness   Physical Examination: Filed Vitals:   08/09/15 2300 08/09/15 2354  BP: 119/98 185/78  Pulse: 73 67  Temp: 97.8 F (36.6 C) 97.9 F (36.6 C)  Resp: 17 18   Physical Exam  Constitutional: He appears well-developed and well-nourished.  Psych: Affect appropriate to situation Eyes: No scleral injection HENT: No OP obstrucion Head: Normocephalic.  Cardiovascular: Normal rate and regular rhythm.  Respiratory: Effort normal and breath sounds normal.  GI: Soft. Bowel sounds are normal. No distension. There is no tenderness.  Skin: WDI  Neurologic Examination: Mental  Status: Alert, oriented, thought content appropriate.  Speech fluent without evidence of aphasia.  Able to follow 3 step commands without difficulty. Cranial Nerves: II: optic discs not visualized, visual fields grossly normal, pupils equal, round, reactive to light and accommodation III,IV, VI: ptosis not present, extra-ocular motions intact bilaterally V,VII: smile symmetric, facial light touch sensation normal bilaterally VIII: hearing normal bilaterally IX,X: gag reflex present XI: trapezius strength/neck flexion strength normal bilaterally XII: tongue strength normal  Motor: Right : Upper extremity    Left:     Upper extremity 5/5 deltoid       5/5 deltoid 5/5 biceps      5/5 biceps  5/5 triceps      5/5 triceps 5/5 hand grip      5/5 hand grip  Lower extremity     Lower extremity 5/5 hip flexor      5/5 hip flexor 5/5 quadricep      5/5 quadriceps  5/5 hamstrings     5/5 hamstrings 5/5 plantar flexion       5/5 plantar flexion 5/5 plantar extension     5/5 plantar extension Tone and bulk:normal tone throughout; no atrophy noted Sensory: Pinprick and light touch intact throughout, bilaterally Deep Tendon Reflexes: 2+ and symmetric throughout Plantars: Right: downgoing   Left: downgoing Cerebellar: normal finger-to-nose, and normal heel-to-shin test Gait: deferred  Laboratory Studies:   Basic Metabolic Panel:  Recent Labs Lab 08/09/15 2010  NA 139  K 3.3*  CL 105  CO2 26  GLUCOSE 118*  BUN 31*  CREATININE 1.34*  CALCIUM 9.2    Liver Function Tests:  Recent Labs Lab 08/09/15 2010  AST 24  ALT 18  ALKPHOS 83  BILITOT 0.6  PROT 7.8  ALBUMIN 3.9   No results for input(s): LIPASE, AMYLASE in the last 168 hours. No results for input(s): AMMONIA in the last 168 hours.  CBC:  Recent Labs Lab 08/09/15 2010  WBC 8.2  HGB 12.2  HCT 36.8  MCV 87.6  PLT 214    Cardiac Enzymes:  Recent Labs Lab 08/09/15 2010  TROPONINI <0.03    BNP: Invalid  input(s): POCBNP  CBG:  Recent Labs Lab 08/09/15 1947  GLUCAP 143*    Microbiology: Results for orders placed or performed in visit on 05/23/12  Urine culture     Status: None   Collection Time: 05/23/12  1:29 PM  Result Value Ref Range Status   Culture ESCHERICHIA COLI  Final   Colony Count >=100,000 COLONIES/ML  Final   Organism ID, Bacteria ESCHERICHIA COLI  Final      Susceptibility   Escherichia coli -  (no method available)    AMPICILLIN  4 Sensitive     AMPICILLIN/SULBACTAM <=2 Sensitive     PIP/TAZO <=4 Sensitive     IMIPENEM <=0.25 Sensitive     CEFAZOLIN <=4 Sensitive     CEFOXITIN <=4 Sensitive     CEFTRIAXONE <=1 Sensitive     CEFTAZIDIME <=1 Sensitive     CEFEPIME <=1 Sensitive     GENTAMICIN <=1 Sensitive     TOBRAMYCIN <=1 Sensitive     CIPROFLOXACIN <=0.25 Sensitive     LEVOFLOXACIN <=0.12 Sensitive     NITROFURANTOIN 32 Sensitive     TRIMETH/SULFA <=20 Sensitive     Coagulation Studies: No results for input(s): LABPROT, INR in the last 72 hours.  Urinalysis: No results for input(s): COLORURINE, LABSPEC, PHURINE, GLUCOSEU, HGBUR, BILIRUBINUR, KETONESUR, PROTEINUR, UROBILINOGEN, NITRITE, LEUKOCYTESUR in the last 168 hours.  Invalid input(s): APPERANCEUR  Lipid Panel:     Component Value Date/Time   CHOL 191 06/21/2015 1002   TRIG 72.0 06/21/2015 1002   HDL 48.40 06/21/2015 1002   CHOLHDL 4 06/21/2015 1002   VLDL 14.4 06/21/2015 1002   LDLCALC 128* 06/21/2015 1002    HgbA1C: No results found for: HGBA1C  Urine Drug Screen:  No results found for: LABOPIA, COCAINSCRNUR, LABBENZ, AMPHETMU, THCU, LABBARB  Alcohol Level: No results for input(s): ETH in the last 168 hours.  Other results:  Imaging: Ct Head Wo Contrast  08/09/2015  CLINICAL DATA:  74 year old female with right sided weakness. EXAM: CT HEAD WITHOUT CONTRAST TECHNIQUE: Contiguous axial images were obtained from the base of the skull through the vertex without intravenous contrast.  COMPARISON:  MRI dated 03/29/2015 and CT dated 02/07/2012 FINDINGS: The ventricles are dilated and the sulci are prominent compatible with age-related atrophy. Periventricular and deep white matter hypodensities represent chronic microvascular ischemic changes. There is no intracranial hemorrhage. No mass effect or midline shift identified. There is diffuse mucoperiosteal thickening of right maxillary sinus. The remainder of the visualized paranasal sinuses and mastoid air cells are well aerated. The calvarium is intact. IMPRESSION: No acute intracranial hemorrhage. Age-related atrophy and chronic microvascular ischemic disease. If symptoms persist and there are no contraindications, MRI may provide better evaluation if clinically indicated. Electronically Signed   By: Anner Crete M.D.   On: 08/09/2015 20:07    Assessment: 74 y.o. female hx of HTN, CAD, TIAs presenting with recurrent transient episodes of right sided weakness involving arm and leg. Currently asymptomatic. Admitted for TIA workup.   Plan: 1. HgbA1c, fasting lipid panel 2. MRI, MRA  of the brain without contrast 3. PT consult, OT consult, Speech consult 4. Echocardiogram 5. Carotid dopplers 6. Prophylactic therapy-ASA 325mg  daily 7. Risk factor modification 8. Telemetry monitoring 9. Frequent neuro checks 10. NPO until RN stroke swallow screen    Jim Like, DO [facility] [phone]  If 7pm- 7am, please page neurology on call as listed in [place]. 08/10/2015, 1:21 AM

## 2015-08-10 NOTE — Procedures (Signed)
ELECTROENCEPHALOGRAM REPORT   Patient: Colleen Wright        Age: 74 y.o.        Sex: female Referring Physician: Dr Jaynee Eagles Report Date:  08/10/2015        Interpreting Physician: Hulen Luster  History: Colleen Wright is an 74 y.o. female with transient right sided weakness  Medications:  I have reviewed the patient's current medications.  Conditions of Recording:  This is a 16 channel EEG carried out with the patient in the drowsy state.  Description:  The waking background activity consists of a low voltage, symmetrical, fairly well organized, 8-9 Hz alpha activity, seen from the parieto-occipital and posterior temporal regions. No focal slowing or epileptiform activity is noted. The patient drowses with slowing to irregular, low voltage theta and beta activity.    Hyperventilation was not performed. Intermittent photic stimulation was not performed.  IMPRESSION: Normal drowsy electroencephalogram. There are no focal lateralizing or epileptiform features.   Jim Like, DO [facility] [phone]  If 7pm- 7am, please page neurology on call as listed in [place]. 08/10/2015, 8:05 PM

## 2015-08-10 NOTE — Progress Notes (Signed)
PT Cancellation Note  Patient Details Name: Colleen Wright MRN: [id number] DOB: 1941/06/02   Cancelled Treatment:    Reason Eval/Treat Not Completed: OT screened, no needs identified, will sign off;Patient declined, no reason specified DC PT order per OT recommendation and patient decline of PT need.  MD notified.  Zenia Resides, Tymon Nemetz L 08/10/2015, 12:01 PM

## 2015-08-10 NOTE — H&P (Signed)
[facility] History and Physical  [name] [id number] DOB: 1940/11/23 DOA: 08/09/2015  Referring physician: Transfer from [place] [facility] PCP: Howard Pouch, DO   Chief Complaint: Right-sided weakness  HPI:  [name] is a 74 year old female with a past medical history significant for hypertension, hyperlipidemia, CAD, thyroid cancer s/p thyroidectomy, arthritis; who presents with these complaints of right-sided weakness. Patient initially noticed that she had one episode yesterday(12/23) with acute onset of right upper and lower sided weakness. Symptoms lasted approximately 15-30 minutes and self resolved.  She states it felt as if her hand would fall off. Then the preceding day she notes 2 episodes each lasting about the same amount of time. She became concerned the possibility of a stroke due to the increased frequency of symptoms and went to [facility] for further evaluation. Once their initial CT scan showed no acute signs of a stroke. She was transferred here for further workup. Upon admission into the emergency department it appears that patient notes that the symptoms actually have been going on for over a year now, but less frequent. She also notes intermittently having palpitations, but denies having any chest pain or leg swelling. She reports that she has tried nothing for the symptoms above. Nothing makes symptoms worse or better.    Review of Systems  Constitutional: Negative for fever, chills, weight loss and malaise/fatigue.  HENT: Negative for ear pain and tinnitus.   Eyes: Negative for photophobia and pain.  Respiratory: Negative for hemoptysis and sputum production.   Cardiovascular: Positive for palpitations. Negative for chest pain and leg swelling.  Gastrointestinal: Negative for nausea, vomiting and abdominal pain.  Genitourinary: Negative for urgency and frequency.  Musculoskeletal: Positive for joint pain. Negative for neck pain.   Skin: Negative for itching and rash.  Neurological: Positive for sensory change and focal weakness. Negative for loss of consciousness and headaches.  Endo/Heme/Allergies: Negative for polydipsia. Does not bruise/bleed easily.  Psychiatric/Behavioral: Negative for substance abuse.      Past Medical History  Diagnosis Date  . Colon polyps   . Allergy   . Arthritis     low back  . Cancer [facility])     thyroid, s/p thyroidectomy 8 years ago  . Depression   . GERD (gastroesophageal reflux disease)   . Hyperlipidemia   . Hypertension   . Ulcer   . Hypothyroidism 06/12/2011  . Hiatal hernia   . CAD (coronary artery disease) 06/12/2011  . Back pain 06/13/2011  . Vertigo 06/13/2011  . Leg fracture   . Thyroid cancer ([place])   . Skin lesions, generalized 05/23/2012  . Pain of left heel 05/23/2012  . Pain in joint, ankle and foot 11/02/2012     Past Surgical History  Procedure Laterality Date  . Thyroidectomy, partial      twice  . Fracture surgery      left leg, pin and 5 bolts in place in lower tibia  . Tubal ligation      growth removed from posterior vaginal vault  . Lesion removal      vaginal  . Balloon angioplasty, artery        Social History:  reports that she has quit smoking. She has never used smokeless tobacco. She reports that she does not drink alcohol or use illicit drugs. Where does patient live--home and with whom if at home? Husband  Can patient participate in ADLs?  Allergies  Allergen Reactions  . Amoxicillin Other (See Comments)  Patient develops a very bad yeast infection.    Family History  Problem Relation Age of Onset  . Cancer Mother     ovary/uterus-can't remember  . Stroke Mother   . Heart disease Father   . Heart disease Sister     bypass  . Stroke Sister   . Cancer Sister     LN, bone  . Hypertension Sister   . Heart disease Brother     bypass  . Cancer Cousin     breast     Prior to Admission medications   Medication Sig  Start Date End Date Taking? Authorizing Provider  ALPRAZolam Duanne Moron) 1 MG tablet Take 1 tablet (1 mg total) by mouth at bedtime as needed. 06/21/15  Yes Renee A Kuneff, DO  aspirin 325 MG tablet Take 325 mg by mouth daily. Patient used this medication for dizziness.   Yes Historical Provider, MD  Cholecalciferol (VITAMIN D3) 50000 UNITS CAPS Take 1 capsule by mouth every 7 (seven) days. 01/16/15  Yes Irene Pap, NP  levothyroxine (SYNTHROID, LEVOTHROID) 125 MCG tablet Take 1 tablet (125 mcg total) by mouth daily. 06/24/15  Yes Renee A Kuneff, DO  losartan-hydrochlorothiazide (HYZAAR) 50-12.5 MG per tablet Take 1 tablet by mouth daily. 03/18/15  Yes Renee A Kuneff, DO  nitroGLYCERIN (NITROSTAT) 0.4 MG SL tablet Place 1 tablet (0.4 mg total) under the tongue every 5 (five) minutes x 3 doses as needed for chest pain. 02/08/12 09/13/14  Roger A Arguello, PA-C  zoster vaccine live, PF, (ZOSTAVAX) 60454 UNT/0.65ML injection Inject 19,400 Units into the skin once. 06/21/15   Renee A Raoul Pitch, DO     Physical Exam: Filed Vitals:   08/10/15 1000 08/10/15 1200 08/10/15 1414 08/10/15 1736  BP: 138/60 140/63 133/63 155/76  Pulse: 64 66 70 86  Temp: 97.8 F (36.6 C) 98 F (36.7 C) 98.3 F (36.8 C) 97.5 F (36.4 C)  TempSrc: Oral Oral Oral Oral  Resp: 18 17 18 18   Height:      Weight:      SpO2: 100% 98% 98% 97%     Constitutional: Vital signs reviewed. Patient is a elderly female who appears at age. Well-developed and well-nourished in no acute distress and cooperative with exam. Alert and oriented x3.  Head: Normocephalic and atraumatic  Ear: TM normal bilaterally  Mouth: no erythema or exudates, MMM  Eyes: PERRL, EOMI, conjunctivae normal, No scleral icterus.  Neck: Supple, Trachea midline normal ROM, No JVD, mass, thyromegaly, or carotid bruit present.  Cardiovascular: RRR, S1 normal, S2 normal, no MRG, pulses symmetric and intact bilaterally  Pulmonary/Chest: CTAB, no wheezes, rales, or rhonchi   Abdominal: Soft. Non-tender, non-distended, bowel sounds are normal, no masses, organomegaly, or guarding present.  GU: no CVA tenderness Musculoskeletal: No joint deformities, erythema, or stiffness, ROM full and no nontender Ext: no edema and no cyanosis, pulses palpable bilaterally (DP and PT)  Hematology: no cervical, inginal, or axillary adenopathy.  Neurological: A&O x3, Strenght is normal and symmetric bilaterally, cranial nerve II-XII are grossly intact, no focal motor deficit, sensory intact to light touch bilaterally.  Skin: Warm, dry and intact. No rash, cyanosis, or clubbing.  Psychiatric: Normal mood and affect. speech and behavior is normal. Judgment and thought content normal. Cognition and memory are normal.      Data Review   Micro Results No results found for this or any previous visit (from the past 240 hour(s)).  Radiology Reports Dg Chest 2 View  08/10/2015  CLINICAL  DATA:  Left extremity weakness.  The hypertension. EXAM: CHEST  2 VIEW COMPARISON:  02/07/2012 FINDINGS: Heart size is normal. No pleural effusion or edema. No airspace consolidation. Coarsened interstitial markings of emphysema identified. IMPRESSION: 1. No acute cardiopulmonary abnormality. 2. Emphysema. Electronically Signed   By: Kerby Moors M.D.   On: 08/10/2015 09:39   Ct Head Wo Contrast  08/09/2015  CLINICAL DATA:  74 year old female with right sided weakness. EXAM: CT HEAD WITHOUT CONTRAST TECHNIQUE: Contiguous axial images were obtained from the base of the skull through the vertex without intravenous contrast. COMPARISON:  MRI dated 03/29/2015 and CT dated 02/07/2012 FINDINGS: The ventricles are dilated and the sulci are prominent compatible with age-related atrophy. Periventricular and deep white matter hypodensities represent chronic microvascular ischemic changes. There is no intracranial hemorrhage. No mass effect or midline shift identified. There is diffuse mucoperiosteal thickening of  right maxillary sinus. The remainder of the visualized paranasal sinuses and mastoid air cells are well aerated. The calvarium is intact. IMPRESSION: No acute intracranial hemorrhage. Age-related atrophy and chronic microvascular ischemic disease. If symptoms persist and there are no contraindications, MRI may provide better evaluation if clinically indicated. Electronically Signed   By: Anner Crete M.D.   On: 08/09/2015 20:07   Mr Brain Wo Contrast  08/10/2015  CLINICAL DATA:  Initial evaluation for intermittent right-sided weakness. EXAM: MRI HEAD WITHOUT CONTRAST MRA HEAD WITHOUT CONTRAST TECHNIQUE: Multiplanar, multiecho pulse sequences of the brain and surrounding structures were obtained without intravenous contrast. Angiographic images of the head were obtained using MRA technique without contrast. COMPARISON:  Prior CT from 08/09/2015 as well as previous MRI from 03/29/2015. FINDINGS: MRI HEAD FINDINGS Generalized age-related cerebral atrophy present. Patchy and confluent T2/FLAIR hyperintensity within the periventricular and deep white matter both cerebral hemispheres present, most consistent with moderate chronic small vessel ischemic disease. There are superimposed remote lacunar infarcts within the left basal ganglia/ corona radiata. No abnormal foci of restricted diffusion to suggest acute intracranial infarct. Gray-white matter differentiation maintained. Normal intracranial vascular flow voids are preserved. No acute or chronic intracranial hemorrhage. No mass lesion, midline shift, or mass effect. Ventricular prominence related to global parenchymal volume loss present without hydrocephalus. No extra-axial fluid collection. Craniocervical junction within normal limits. Pituitary gland normal.  No acute abnormality about the orbits. Mild mucosal thickening within the right maxillary sinus and ethmoidal air cells. Paranasal sinuses are otherwise clear. Small right mastoid effusion noted,  likely benign. Inner ear structures within normal limits. Bone marrow signal intensity within normal limits. No scalp soft tissue abnormality. MRA HEAD FINDINGS ANTERIOR CIRCULATION: Visualized distal cervical segments of the internal carotid arteries are patent with antegrade flow. Right ICA is slightly larger than the left. Petrous, cavernous, and supraclinoid segments widely patent. Right A1 segment widely patent. Left A1 segment hypoplastic but patent. Anterior communicating artery normal. Anterior cerebral arteries well opacified. Single short segment mild stenosis within the proximal right M1 segment (series 501, image 12). Right M1 segment otherwise widely patent to the bifurcation. Right MCA bifurcation itself within normal limits. Right MCA branches distally are well opacified. Left M1 segment widely patent without stenosis or occlusion. Left MCA bifurcation normal. Short-segment moderate stenosis within proximal left M2 branch at the base of the sylvian fissure (Series 501, image 12). Left MCA branches are well opacified distally. POSTERIOR CIRCULATION: Vertebral arteries widely patent to the vertebrobasilar junction. Posterior inferior cerebral arteries patent bilaterally. Basilar artery widely patent to its distal aspect. Small left anterior inferior cerebral artery noted.  Superior cerebral arteries patent bilaterally. There are 2 left-sided SCAs. Both of the posterior cerebral arteries arise the basilar artery and are well opacified to their distal aspects. Short-segment stenoses within the P2 segments bilaterally, fairly severe in nature, perhaps slightly worse on the left (series 506, image 16). Additional atheromatous irregularity within the left P2 segment proximally. No aneurysm or vascular malformation. IMPRESSION: MRI HEAD IMPRESSION: 1. No acute intracranial infarct or other process identified. 2. 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 3. Age-related  cerebral atrophy with moderate chronic microvascular ischemic disease. MRA HEAD IMPRESSION: 1. No large or proximal arterial branch occlusion within the intracranial circulation. No correctable stenosis. 2. Short-segment moderate stenosis within a proximal left M2 branch, with additional short-segment mild stenosis within the proximal right M1 segment. Anterior circulation otherwise widely patent. 3. Short-segment severe stenoses within the mid -distal P2 segments bilaterally. Electronically Signed   By: Jeannine Boga M.D.   On: 08/10/2015 06:04   Mr Jodene Nam Head/brain Wo Cm  08/10/2015  CLINICAL DATA:  Initial evaluation for intermittent right-sided weakness. EXAM: MRI HEAD WITHOUT CONTRAST MRA HEAD WITHOUT CONTRAST TECHNIQUE: Multiplanar, multiecho pulse sequences of the brain and surrounding structures were obtained without intravenous contrast. Angiographic images of the head were obtained using MRA technique without contrast. COMPARISON:  Prior CT from 08/09/2015 as well as previous MRI from 03/29/2015. FINDINGS: MRI HEAD FINDINGS Generalized age-related cerebral atrophy present. Patchy and confluent T2/FLAIR hyperintensity within the periventricular and deep white matter both cerebral hemispheres present, most consistent with moderate chronic small vessel ischemic disease. There are superimposed remote lacunar infarcts within the left basal ganglia/ corona radiata. No abnormal foci of restricted diffusion to suggest acute intracranial infarct. Gray-white matter differentiation maintained. Normal intracranial vascular flow voids are preserved. No acute or chronic intracranial hemorrhage. No mass lesion, midline shift, or mass effect. Ventricular prominence related to global parenchymal volume loss present without hydrocephalus. No extra-axial fluid collection. Craniocervical junction within normal limits. Pituitary gland normal.  No acute abnormality about the orbits. Mild mucosal thickening within the  right maxillary sinus and ethmoidal air cells. Paranasal sinuses are otherwise clear. Small right mastoid effusion noted, likely benign. Inner ear structures within normal limits. Bone marrow signal intensity within normal limits. No scalp soft tissue abnormality. MRA HEAD FINDINGS ANTERIOR CIRCULATION: Visualized distal cervical segments of the internal carotid arteries are patent with antegrade flow. Right ICA is slightly larger than the left. Petrous, cavernous, and supraclinoid segments widely patent. Right A1 segment widely patent. Left A1 segment hypoplastic but patent. Anterior communicating artery normal. Anterior cerebral arteries well opacified. Single short segment mild stenosis within the proximal right M1 segment (series 501, image 12). Right M1 segment otherwise widely patent to the bifurcation. Right MCA bifurcation itself within normal limits. Right MCA branches distally are well opacified. Left M1 segment widely patent without stenosis or occlusion. Left MCA bifurcation normal. Short-segment moderate stenosis within proximal left M2 branch at the base of the sylvian fissure (Series 501, image 12). Left MCA branches are well opacified distally. POSTERIOR CIRCULATION: Vertebral arteries widely patent to the vertebrobasilar junction. Posterior inferior cerebral arteries patent bilaterally. Basilar artery widely patent to its distal aspect. Small left anterior inferior cerebral artery noted. Superior cerebral arteries patent bilaterally. There are 2 left-sided SCAs. Both of the posterior cerebral arteries arise the basilar artery and are well opacified to their distal aspects. Short-segment stenoses within the P2 segments bilaterally, fairly severe in nature, perhaps slightly worse on the  left (series 506, image 16). Additional atheromatous irregularity within the left P2 segment proximally. No aneurysm or vascular malformation. IMPRESSION: MRI HEAD IMPRESSION: 1. No acute intracranial infarct or other  process identified. 2. 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 3. Age-related cerebral atrophy with moderate chronic microvascular ischemic disease. MRA HEAD IMPRESSION: 1. No large or proximal arterial branch occlusion within the intracranial circulation. No correctable stenosis. 2. Short-segment moderate stenosis within a proximal left M2 branch, with additional short-segment mild stenosis within the proximal right M1 segment. Anterior circulation otherwise widely patent. 3. Short-segment severe stenoses within the mid -distal P2 segments bilaterally. Electronically Signed   By: Jeannine Boga M.D.   On: 08/10/2015 06:04     CBC  Recent Labs Lab 08/09/15 2010  WBC 8.2  HGB 12.2  HCT 36.8  PLT 214  MCV 87.6  MCH 29.0  MCHC 33.2  RDW 12.4    Chemistries   Recent Labs Lab 08/09/15 2010  NA 139  K 3.3*  CL 105  CO2 26  GLUCOSE 118*  BUN 31*  CREATININE 1.34*  CALCIUM 9.2  AST 24  ALT 18  ALKPHOS 83  BILITOT 0.6   ------------------------------------------------------------------------------------------------------------------ estimated creatinine clearance is 32.1 mL/min (by C-G formula based on Cr of 1.34). ------------------------------------------------------------------------------------------------------------------ No results for input(s): HGBA1C in the last 72 hours. ------------------------------------------------------------------------------------------------------------------  Recent Labs  08/10/15 0252  CHOL 182  HDL 48  LDLCALC 124*  TRIG 50  CHOLHDL 3.8   ------------------------------------------------------------------------------------------------------------------ No results for input(s): TSH, T4TOTAL, T3FREE, THYROIDAB in the last 72 hours.  Invalid input(s): FREET3 ------------------------------------------------------------------------------------------------------------------ No results for  input(s): VITAMINB12, FOLATE, FERRITIN, TIBC, IRON, RETICCTPCT in the last 72 hours.  Coagulation profile No results for input(s): INR, PROTIME in the last 168 hours.  No results for input(s): DDIMER in the last 72 hours.  Cardiac Enzymes  Recent Labs Lab 08/09/15 2010  TROPONINI <0.03   ------------------------------------------------------------------------------------------------------------------ Invalid input(s): POCBNP   CBG:  Recent Labs Lab 08/09/15 1947  GLUCAP 143*       EKG: Independently reviewed.   Assessment/Plan Principal Problem:  Suspected TIA (transient ischemic attack) with reports of right sided weakness: Recurrent. Patient with complaints of transient episodes of right-sided weakness with increasing frequency. Initial CT of head negative question underlying possible cardiac cause of symptoms. - Admit to telemetry bed - Consulted neurology follow-up recommendations in a.m. 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 Question if the thyroid is causing patient's above symptoms. - Check TSH - Continue levothyroxine  Chronic kidney disease stage III: Patient's baseline creatinine appears to be 1- 1.1 , however slightly elevated on admission of 1.34.  -  gentle IV fluids ordered however patient declined  HTN (hypertension):stable -Continue Hyzaar    Hypokalemia: Acute potassium noted to be 3.3 - Replace potassium - Continue to monitor and replace as needed  Insomnia -Xanax prn sleep     Code Status:   full Family Communication: bedside Disposition Plan: admit   Total time spent 55 minutes.Greater than 50% of this time was spent in counseling, explanation of diagnosis, planning of further management, and coordination of care  [facility] Pager  [phone]  If 7PM-7AM, please contact night-coverage [email or web address] Password [facility] 08/10/2015, 5:40 PM

## 2015-08-10 NOTE — Progress Notes (Signed)
Patient arrived to 5M03 AAOx4 and pleasant. Family at the bedside. Vitals taken. MD paged of patient arrival. Will continue to monitor. Moritz Lever, Rande Brunt, RN

## 2015-08-10 NOTE — H&P (Signed)
Patient Demographics  Colleen Wright, is a 74 y.o. female  MRN: [id number]   DOB - 22-Jul-1941  Admit Date - 08/09/2015  Outpatient Primary MD for the patient is Howard Pouch, DO   With History of -  Past Medical History  Diagnosis Date  . Colon polyps   . Allergy   . Arthritis     low back  . Cancer [facility])     thyroid, s/p thyroidectomy 8 years ago  . Depression   . GERD (gastroesophageal reflux disease)   . Hyperlipidemia   . Hypertension   . Ulcer   . Hypothyroidism 06/12/2011  . Hiatal hernia   . CAD (coronary artery disease) 06/12/2011  . Back pain 06/13/2011  . Vertigo 06/13/2011  . Leg fracture   . Thyroid cancer ([place])   . Skin lesions, generalized 05/23/2012  . Pain of left heel 05/23/2012  . Pain in joint, ankle and foot 11/02/2012      Past Surgical History  Procedure Laterality Date  . Thyroidectomy, partial      twice  . Fracture surgery      left leg, pin and 5 bolts in place in lower tibia  . Tubal ligation      growth removed from posterior vaginal vault  . Lesion removal      vaginal  . Balloon angioplasty, artery      in for   Chief Complaint  Patient presents with  . Extremity Weakness     HPI  Colleen Wright  is a 74 y.o. female, with past medical history of hypertension, coronary artery disease, TIA in the past, presents to [facility] emergency department complaining of intermittent right extremity weakness, tingling numbness, reports she had 3-4 episodes over the last 48 hours, each lasting 30-40 minutes, she denies any slurred speech, visual droop, loss of consciousness or confusion, husband at bedside confirmed the history, she denies any seizure activity, urine or stool incontinence, as well husband noticed he notes episodes where he thinks her wife space out for a few minutes, patient was symptom free by the time she presented to ED, no focal deficits noticed, CT head with no acute finding, hospitalist requested to admit to evaluate for  TIA.    Review of Systems    In addition to the HPI above,  No Fever-chills, No Headache, No changes with Vision or hearing, No problems swallowing food or Liquids, No Chest pain, Cough or Shortness of Breath, No Abdominal pain, No Nausea or Vommitting, Bowel movements are regular, No Blood in stool or Urine, No dysuria, No new skin rashes or bruises, No new joints pains-aches,   intermittent episodes of right side weakness/tingling and numbness Denies recent weight gain or loss, No polyuria, polydypsia or polyphagia, No significant Mental Stressors.  A full 10 point Review of Systems was done, except as stated above, all other Review of Systems were negative.   Social History Social History  Substance Use Topics  . Smoking status: Former Research scientist (life sciences)  . Smokeless tobacco: Never Used  . Alcohol Use: No    Family History Family History  Problem Relation Age of Onset  . Cancer Mother     ovary/uterus-can't remember  . Stroke Mother   . Heart disease Father   . Heart disease Sister     bypass  . Stroke Sister   . Cancer Sister     LN, bone  . Hypertension Sister   . Heart disease Brother     bypass  .  Cancer Cousin     breast     Prior to Admission medications   Medication Sig Start Date End Date Taking? Authorizing Provider  ALPRAZolam Duanne Moron) 1 MG tablet Take 1 tablet (1 mg total) by mouth at bedtime as needed. 06/21/15  Yes Renee A Kuneff, DO  aspirin 325 MG tablet Take 325 mg by mouth daily. Patient used this medication for dizziness.   Yes Historical Provider, MD  Cholecalciferol (VITAMIN D3) 50000 UNITS CAPS Take 1 capsule by mouth every 7 (seven) days. 01/16/15  Yes Irene Pap, NP  levothyroxine (SYNTHROID, LEVOTHROID) 125 MCG tablet Take 1 tablet (125 mcg total) by mouth daily. 06/24/15  Yes Renee A Kuneff, DO  losartan-hydrochlorothiazide (HYZAAR) 50-12.5 MG per tablet Take 1 tablet by mouth daily. 03/18/15  Yes Renee A Kuneff, DO  nitroGLYCERIN (NITROSTAT)  0.4 MG SL tablet Place 1 tablet (0.4 mg total) under the tongue every 5 (five) minutes x 3 doses as needed for chest pain. 02/08/12 09/13/14  Roger A Arguello, PA-C  zoster vaccine live, PF, (ZOSTAVAX) 02725 UNT/0.65ML injection Inject 19,400 Units into the skin once. 06/21/15   Renee A Kuneff, DO    Allergies  Allergen Reactions  . Amoxicillin Other (See Comments)    Patient develops a very bad yeast infection.    Physical Exam  Vitals  Blood pressure 133/63, pulse 70, temperature 98.3 F (36.8 C), temperature source Oral, resp. rate 18, height 5\' 2"  (1.575 m), weight 67.132 kg (148 lb), SpO2 98 %.   1. Generwell-nourished female  lying in bed in NAD,    2. Normal affect and insight, Not Suicidal or Homicidal, Awake Alert, Oriented X 3.  3. No F.N deficits, ALL C.Nerves Intact, Strength 5/5 all 4 extremities, Sensation intact all 4 extremities, Plantars down going.  4. Ears and Eyes appear Normal, Conjunctivae clear, PERRLA. Moist Oral Mucosa.  5. Supple Neck, No JVD, No cervical lymphadenopathy appriciated, No Carotid Bruits.  6. Symmetrical Chest wall movement, Good air movement bilaterally, CTAB.  7. RRR, No Gallops, Rubs or Murmurs, No Parasternal Heave.  8. Positive Bowel Sounds, Abdomen Soft, No tenderness, No organomegaly appriciated,No rebound -guarding or rigidity.  9.  No Cyanosis, Normal Skin Turgor, No Skin Rash or Bruise.  10. Good muscle tone,  joints appear normal , no effusions, Normal ROM.  11. No Palpable Lymph Nodes in Neck or Axillae    Data Review  CBC  Recent Labs Lab 08/09/15 2010  WBC 8.2  HGB 12.2  HCT 36.8  PLT 214  MCV 87.6  MCH 29.0  MCHC 33.2  RDW 12.4   ------------------------------------------------------------------------------------------------------------------  Chemistries   Recent Labs Lab 08/09/15 2010  NA 139  K 3.3*  CL 105  CO2 26  GLUCOSE 118*  BUN 31*  CREATININE 1.34*  CALCIUM 9.2  AST 24  ALT 18    ALKPHOS 83  BILITOT 0.6   ------------------------------------------------------------------------------------------------------------------ estimated creatinine clearance is 32.1 mL/min (by C-G formula based on Cr of 1.34). ------------------------------------------------------------------------------------------------------------------ No results for input(s): TSH, T4TOTAL, T3FREE, THYROIDAB in the last 72 hours.  Invalid input(s): FREET3   Coagulation profile No results for input(s): INR, PROTIME in the last 168 hours. ------------------------------------------------------------------------------------------------------------------- No results for input(s): DDIMER in the last 72 hours. -------------------------------------------------------------------------------------------------------------------  Cardiac Enzymes  Recent Labs Lab 08/09/15 2010  TROPONINI <0.03   ------------------------------------------------------------------------------------------------------------------ Invalid input(s): POCBNP   ---------------------------------------------------------------------------------------------------------------  Urinalysis    Component Value Date/Time   COLORURINE YELLOW 03/24/2013 [place] 03/24/2013 1644  LABSPEC 1.021 03/24/2013 1644   PHURINE 5.5 03/24/2013 1644   GLUCOSEU NEG 03/24/2013 1644   HGBUR NEG 03/24/2013 1644   BILIRUBINUR NEG 03/24/2013 1644   BILIRUBINUR neg 05/23/2012 1329   KETONESUR NEG 03/24/2013 1644   PROTEINUR NEG 03/24/2013 1644   PROTEINUR neg 05/23/2012 1329   UROBILINOGEN 0.2 03/24/2013 1644   UROBILINOGEN 0.2 05/23/2012 1329   NITRITE NEG 03/24/2013 1644   NITRITE neg 05/23/2012 1329   LEUKOCYTESUR NEG 03/24/2013 1644    ----------------------------------------------------------------------------------------------------------------  Imaging results:   Dg Chest 2 View  08/10/2015  CLINICAL DATA:  Left  extremity weakness.  The hypertension. EXAM: CHEST  2 VIEW COMPARISON:  02/07/2012 FINDINGS: Heart size is normal. No pleural effusion or edema. No airspace consolidation. Coarsened interstitial markings of emphysema identified. IMPRESSION: 1. No acute cardiopulmonary abnormality. 2. Emphysema. Electronically Signed   By: Kerby Moors M.D.   On: 08/10/2015 09:39   Ct Head Wo Contrast  08/09/2015  CLINICAL DATA:  74 year old female with right sided weakness. EXAM: CT HEAD WITHOUT CONTRAST TECHNIQUE: Contiguous axial images were obtained from the base of the skull through the vertex without intravenous contrast. COMPARISON:  MRI dated 03/29/2015 and CT dated 02/07/2012 FINDINGS: The ventricles are dilated and the sulci are prominent compatible with age-related atrophy. Periventricular and deep white matter hypodensities represent chronic microvascular ischemic changes. There is no intracranial hemorrhage. No mass effect or midline shift identified. There is diffuse mucoperiosteal thickening of right maxillary sinus. The remainder of the visualized paranasal sinuses and mastoid air cells are well aerated. The calvarium is intact. IMPRESSION: No acute intracranial hemorrhage. Age-related atrophy and chronic microvascular ischemic disease. If symptoms persist and there are no contraindications, MRI may provide better evaluation if clinically indicated. Electronically Signed   By: Anner Crete M.D.   On: 08/09/2015 20:07   Mr Brain Wo Contrast  08/10/2015  CLINICAL DATA:  Initial evaluation for intermittent right-sided weakness. EXAM: MRI HEAD WITHOUT CONTRAST MRA HEAD WITHOUT CONTRAST TECHNIQUE: Multiplanar, multiecho pulse sequences of the brain and surrounding structures were obtained without intravenous contrast. Angiographic images of the head were obtained using MRA technique without contrast. COMPARISON:  Prior CT from 08/09/2015 as well as previous MRI from 03/29/2015. FINDINGS: MRI HEAD FINDINGS  Generalized age-related cerebral atrophy present. Patchy and confluent T2/FLAIR hyperintensity within the periventricular and deep white matter both cerebral hemispheres present, most consistent with moderate chronic small vessel ischemic disease. There are superimposed remote lacunar infarcts within the left basal ganglia/ corona radiata. No abnormal foci of restricted diffusion to suggest acute intracranial infarct. Gray-white matter differentiation maintained. Normal intracranial vascular flow voids are preserved. No acute or chronic intracranial hemorrhage. No mass lesion, midline shift, or mass effect. Ventricular prominence related to global parenchymal volume loss present without hydrocephalus. No extra-axial fluid collection. Craniocervical junction within normal limits. Pituitary gland normal.  No acute abnormality about the orbits. Mild mucosal thickening within the right maxillary sinus and ethmoidal air cells. Paranasal sinuses are otherwise clear. Small right mastoid effusion noted, likely benign. Inner ear structures within normal limits. Bone marrow signal intensity within normal limits. No scalp soft tissue abnormality. MRA HEAD FINDINGS ANTERIOR CIRCULATION: Visualized distal cervical segments of the internal carotid arteries are patent with antegrade flow. Right ICA is slightly larger than the left. Petrous, cavernous, and supraclinoid segments widely patent. Right A1 segment widely patent. Left A1 segment hypoplastic but patent. Anterior communicating artery normal. Anterior cerebral arteries well opacified. Single short segment mild stenosis within the proximal right  M1 segment (series 501, image 12). Right M1 segment otherwise widely patent to the bifurcation. Right MCA bifurcation itself within normal limits. Right MCA branches distally are well opacified. Left M1 segment widely patent without stenosis or occlusion. Left MCA bifurcation normal. Short-segment moderate stenosis within proximal  left M2 branch at the base of the sylvian fissure (Series 501, image 12). Left MCA branches are well opacified distally. POSTERIOR CIRCULATION: Vertebral arteries widely patent to the vertebrobasilar junction. Posterior inferior cerebral arteries patent bilaterally. Basilar artery widely patent to its distal aspect. Small left anterior inferior cerebral artery noted. Superior cerebral arteries patent bilaterally. There are 2 left-sided SCAs. Both of the posterior cerebral arteries arise the basilar artery and are well opacified to their distal aspects. Short-segment stenoses within the P2 segments bilaterally, fairly severe in nature, perhaps slightly worse on the left (series 506, image 16). Additional atheromatous irregularity within the left P2 segment proximally. No aneurysm or vascular malformation. IMPRESSION: MRI HEAD IMPRESSION: 1. No acute intracranial infarct or other process identified. 2. 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 3. Age-related cerebral atrophy with moderate chronic microvascular ischemic disease. MRA HEAD IMPRESSION: 1. No large or proximal arterial branch occlusion within the intracranial circulation. No correctable stenosis. 2. Short-segment moderate stenosis within a proximal left M2 branch, with additional short-segment mild stenosis within the proximal right M1 segment. Anterior circulation otherwise widely patent. 3. Short-segment severe stenoses within the mid -distal P2 segments bilaterally. Electronically Signed   By: Jeannine Boga M.D.   On: 08/10/2015 06:04   Mr Jodene Nam Head/brain Wo Cm  08/10/2015  CLINICAL DATA:  Initial evaluation for intermittent right-sided weakness. EXAM: MRI HEAD WITHOUT CONTRAST MRA HEAD WITHOUT CONTRAST TECHNIQUE: Multiplanar, multiecho pulse sequences of the brain and surrounding structures were obtained without intravenous contrast. Angiographic images of the head were obtained using MRA technique  without contrast. COMPARISON:  Prior CT from 08/09/2015 as well as previous MRI from 03/29/2015. FINDINGS: MRI HEAD FINDINGS Generalized age-related cerebral atrophy present. Patchy and confluent T2/FLAIR hyperintensity within the periventricular and deep white matter both cerebral hemispheres present, most consistent with moderate chronic small vessel ischemic disease. There are superimposed remote lacunar infarcts within the left basal ganglia/ corona radiata. No abnormal foci of restricted diffusion to suggest acute intracranial infarct. Gray-white matter differentiation maintained. Normal intracranial vascular flow voids are preserved. No acute or chronic intracranial hemorrhage. No mass lesion, midline shift, or mass effect. Ventricular prominence related to global parenchymal volume loss present without hydrocephalus. No extra-axial fluid collection. Craniocervical junction within normal limits. Pituitary gland normal.  No acute abnormality about the orbits. Mild mucosal thickening within the right maxillary sinus and ethmoidal air cells. Paranasal sinuses are otherwise clear. Small right mastoid effusion noted, likely benign. Inner ear structures within normal limits. Bone marrow signal intensity within normal limits. No scalp soft tissue abnormality. MRA HEAD FINDINGS ANTERIOR CIRCULATION: Visualized distal cervical segments of the internal carotid arteries are patent with antegrade flow. Right ICA is slightly larger than the left. Petrous, cavernous, and supraclinoid segments widely patent. Right A1 segment widely patent. Left A1 segment hypoplastic but patent. Anterior communicating artery normal. Anterior cerebral arteries well opacified. Single short segment mild stenosis within the proximal right M1 segment (series 501, image 12). Right M1 segment otherwise widely patent to the bifurcation. Right MCA bifurcation itself within normal limits. Right MCA branches distally are well opacified. Left M1 segment  widely patent without stenosis or occlusion. Left MCA bifurcation normal. Short-segment  moderate stenosis within proximal left M2 branch at the base of the sylvian fissure (Series 501, image 12). Left MCA branches are well opacified distally. POSTERIOR CIRCULATION: Vertebral arteries widely patent to the vertebrobasilar junction. Posterior inferior cerebral arteries patent bilaterally. Basilar artery widely patent to its distal aspect. Small left anterior inferior cerebral artery noted. Superior cerebral arteries patent bilaterally. There are 2 left-sided SCAs. Both of the posterior cerebral arteries arise the basilar artery and are well opacified to their distal aspects. Short-segment stenoses within the P2 segments bilaterally, fairly severe in nature, perhaps slightly worse on the left (series 506, image 16). Additional atheromatous irregularity within the left P2 segment proximally. No aneurysm or vascular malformation. IMPRESSION: MRI HEAD IMPRESSION: 1. No acute intracranial infarct or other process identified. 2. 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 3. Age-related cerebral atrophy with moderate chronic microvascular ischemic disease. MRA HEAD IMPRESSION: 1. No large or proximal arterial branch occlusion within the intracranial circulation. No correctable stenosis. 2. Short-segment moderate stenosis within a proximal left M2 branch, with additional short-segment mild stenosis within the proximal right M1 segment. Anterior circulation otherwise widely patent. 3. Short-segment severe stenoses within the mid -distal P2 segments bilaterally. Electronically Signed   By: Jeannine Boga M.D.   On: 08/10/2015 06:04        Assessment & Plan  Active Problems:   TIA (transient ischemic attack)   Possible TIA - Patient presenting with transient recurrent episodes of right-sided weakness involving arm and leg. No acute finding in CT head, admitted for TIA/CVA  workup - MRI brain with no evidence of acute infarction  - MRA head with no stenosis and major blood vessels  - 2-D echo EF 55-60%, no regional wall motion abnormality, no evidence of embolic source  - 2-D echo pending - TEG pending - LDL is 124, will start on Lipitor. - glycohemoglobin A1c is pending  - Currently on aspirin 325 mg oral daily at home, which will be continued during hospital stay,   hypertension  - Continue with home medication   Hypothyroidism - Continue with Synthroid  DVT Prophylaxis  Lovenox -   AM Labs Ordered, also please review Full Orders  Family Communication: Admission, patients condition and plan of care including tests being ordered have been discussed with the patient and husband  who indicate understanding and agree with the plan and Code Status.  Code Status Full  Likely DC to  : Stable   Condition GUARDED    Time spent in minutes : 45 minutes    ELGERGAWY, DAWOOD M.D on 08/10/2015 at 3:43 PM  Between 7am to 7pm - Pager - [phone]  After 7pm go to [email or web address] - password [email or web address]  And look for the night coverage person covering me after hours  [facility] Office  [phone]

## 2015-08-11 DIAGNOSIS — I10 Essential (primary) hypertension: Secondary | ICD-10-CM | POA: Diagnosis not present

## 2015-08-11 DIAGNOSIS — E785 Hyperlipidemia, unspecified: Secondary | ICD-10-CM | POA: Diagnosis not present

## 2015-08-11 DIAGNOSIS — G459 Transient cerebral ischemic attack, unspecified: Secondary | ICD-10-CM | POA: Diagnosis not present

## 2015-08-11 MED ORDER — ATORVASTATIN CALCIUM 40 MG PO TABS: 40.0000 mg | ORAL_TABLET | Freq: Every day | ORAL | Status: DC

## 2015-08-11 NOTE — Progress Notes (Signed)
Pt for discharge home today. Discharge orders received. IV and telemetry dcd. Discharge instructions, handouts and 1 prescription given with verbalized understanding. Family at bedside to assist with discharge. Staff brought patient to lobby via wheelchair at this time.Transported to home by family member.

## 2015-08-11 NOTE — Discharge Instructions (Signed)
Follow with Primary MD Howard Pouch, DO in 7 days   Get CBC, CMP, 2 view Chest X ray checked  by Primary MD next visit.    Activity: As tolerated with Full fall precautions use walker/cane & assistance as needed   Disposition Home    Diet: Heart Healthy  , with feeding assistance and aspiration precautions.  For Heart failure patients - Check your Weight same time everyday, if you gain over 2 pounds, or you develop in leg swelling, experience more shortness of breath or chest pain, call your Primary MD immediately. Follow Cardiac Low Salt Diet and 1.5 lit/day fluid restriction.   On your next visit with your primary care physician please Get Medicines reviewed and adjusted.   Please request your Prim.MD to go over all Hospital Tests and Procedure/Radiological results at the follow up, please get all Hospital records sent to your Prim MD by signing hospital release before you go home.   If you experience worsening of your admission symptoms, develop shortness of breath, life threatening emergency, suicidal or homicidal thoughts you must seek medical attention immediately by calling 911 or calling your MD immediately  if symptoms less severe.  You Must read complete instructions/literature along with all the possible adverse reactions/side effects for all the Medicines you take and that have been prescribed to you. Take any new Medicines after you have completely understood and accpet all the possible adverse reactions/side effects.   Do not drive, operating heavy machinery, perform activities at heights, swimming or participation in water activities or provide baby sitting services if your were admitted for syncope or siezures until you have seen by Primary MD or a Neurologist and advised to do so again.  Do not drive when taking Pain medications.    Do not take more than prescribed Pain, Sleep and Anxiety Medications  Special Instructions: If you have smoked or chewed Tobacco  in the  last 2 yrs please stop smoking, stop any regular Alcohol  and or any Recreational drug use.  Wear Seat belts while driving.   Please note  You were cared for by a hospitalist during your hospital stay. If you have any questions about your discharge medications or the care you received while you were in the hospital after you are discharged, you can call the unit and asked to speak with the hospitalist on call if the hospitalist that took care of you is not available. Once you are discharged, your primary care physician will handle any further medical issues. Please note that NO REFILLS for any discharge medications will be authorized once you are discharged, as it is imperative that you return to your primary care physician (or establish a relationship with a primary care physician if you do not have one) for your aftercare needs so that they can reassess your need for medications and monitor your lab values.

## 2015-08-11 NOTE — Discharge Summary (Signed)
Colleen Wright, is a 74 y.o. female  DOB [birth year]  MRN [id number].  Admission date:  08/09/2015  Admitting Physician  Colleen Morton, MD  Discharge Date:  08/11/2015   Primary MD  Colleen Pouch, DO  Recommendations for primary care physician for things to follow:  - Please check CBC, BMP during next visit - Patient to follow with neurology in 2 months   Admission Diagnosis  Transient cerebral ischemia, unspecified transient cerebral ischemia type [G45.9]   Discharge Diagnosis  Transient cerebral ischemia, unspecified transient cerebral ischemia type [G45.9]    Principal Problem:   TIA (transient ischemic attack) Active Problems:   Postsurgical hypothyroidism   HTN (hypertension)   Right sided weakness   Hypokalemia   HLD (hyperlipidemia)   Insomnia      Past Medical History  Diagnosis Date  . Colon polyps   . Allergy   . Arthritis     low back  . Cancer [facility])     thyroid, s/p thyroidectomy 8 years ago  . Depression   . GERD (gastroesophageal reflux disease)   . Hyperlipidemia   . Hypertension   . Ulcer   . Hypothyroidism 06/12/2011  . Hiatal hernia   . CAD (coronary artery disease) 06/12/2011  . Back pain 06/13/2011  . Vertigo 06/13/2011  . Leg fracture   . Thyroid cancer ([place])   . Skin lesions, generalized 05/23/2012  . Pain of left heel 05/23/2012  . Pain in joint, ankle and foot 11/02/2012    Past Surgical History  Procedure Laterality Date  . Thyroidectomy, partial      twice  . Fracture surgery      left leg, pin and 5 bolts in place in lower tibia  . Tubal ligation      growth removed from posterior vaginal vault  . Lesion removal      vaginal  . Balloon angioplasty, artery         History of present illness and  Hospital Course:     Kindly see H&P for history of present illness and admission details, please review complete Labs, Consult reports and Test  reports for all details in brief  HPI  from the history and physical done on the day of admission  [name] is a 74 year old female with a past medical history significant for hypertension, hyperlipidemia, CAD, thyroid cancer s/p thyroidectomy, arthritis; who presents with these complaints of right-sided weakness. Patient initially noticed that she had one episode yesterday(12/23) with acute onset of right upper and lower sided weakness. Symptoms lasted approximately 15-30 minutes and self resolved. She states it felt as if her hand would fall off. Then the preceding day she notes 2 episodes each lasting about the same amount of time. She became concerned the possibility of a stroke due to the increased frequency of symptoms and went to [facility] [place] [facility] for further evaluation. Once their initial CT scan showed no acute signs of a stroke. She was transferred here for further workup. Upon admission into the  emergency department it appears that patient notes that the symptoms actually have been going on for over a year now, but less frequent. She also notes intermittently having palpitations, but denies having any chest pain or leg swelling. She reports that she has tried nothing for the symptoms above. Nothing makes symptoms worse or better.  Hospital Course  TIA - Patient symptoms are related to TIA, she will be restarted on aspirin 325 mg oral daily(did not take for last few weeks as she ran out) as well she'll be started on Lipitor, and to follow with neurology in 2 months. - Patient presenting with transient recurrent episodes of right-sided weakness involving arm and leg. No acute finding in CT head, was admitted for further workup to rule out CVA/TIA with finding as below. 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 - MRA head with no stenosis and major blood vessels  - 2-D echo EF  55-60%, no regional wall motion abnormality, no evidence of embolic source  - Carotid Doppler :1-39% right ICA stenosis. 40-59% left ICA stenosis, highest end of range. Vertebral artery flow is antegrade.  - TEE with no evidence of seizure activity  - LDL is 124, will start on Lipitor. - glycohemoglobin A1c is pending time of discharge     hypertension  - Continue with home medication   Hypothyroidism - Continue with Synthroid     Discharge Condition:  Stable   Follow UP  Follow-up Information    Follow up with Colleen [facility], DO. Schedule an appointment as soon as possible for a visit in 1 week.   Specialty:  Family Medicine   Contact information:   Colleen Wright [phone]       Follow up with Colleen Aris, MD In 8 weeks.   Specialty:  Emergency Medicine   Why:  will be contacted with appointment   Contact information:   Colleen Wright [phone]         Discharge Instructions  and  Discharge Medications     Discharge Instructions    Ambulatory referral to Neurology    Complete by:  As directed   An appointment is requested in approximately: 8 weeks     Diet - low sodium heart healthy    Complete by:  As directed      Discharge instructions    Complete by:  As directed   Follow with Primary MD Colleen Pouch, DO in 7 days   Get CBC, CMP, 2 view Chest X ray checked  by Primary MD next visit.    Activity: As tolerated with Full fall precautions use walker/cane & assistance as needed   Disposition Home **   Diet: Heart Healthy ** , with feeding assistance and aspiration precautions.  For Heart failure patients - Check your Weight same time everyday, if you gain over 2 pounds, or you develop in leg swelling, experience more shortness of breath or chest pain, call your Primary MD immediately. Follow Cardiac Low Salt Diet and 1.5 lit/day fluid restriction.   On your next visit with your primary care  physician please Get Medicines reviewed and adjusted.   Please request your Prim.MD to go over all Hospital Tests and Procedure/Radiological results at the follow up, please get all Hospital records sent to your Prim MD by signing hospital release before you go home.  If you experience worsening of your admission symptoms, develop shortness of breath, life threatening emergency, suicidal or homicidal thoughts you must seek medical attention immediately by calling 911 or calling your MD immediately  if symptoms less severe.  You Must read complete instructions/literature along with all the possible adverse reactions/side effects for all the Medicines you take and that have been prescribed to you. Take any new Medicines after you have completely understood and accpet all the possible adverse reactions/side effects.   Do not drive, operating heavy machinery, perform activities at heights, swimming or participation in water activities or provide baby sitting services if your were admitted for syncope or siezures until you have seen by Primary MD or a Neurologist and advised to do so again.  Do not drive when taking Pain medications.    Do not take more than prescribed Pain, Sleep and Anxiety Medications  Special Instructions: If you have smoked or chewed Tobacco  in the last 2 yrs please stop smoking, stop any regular Alcohol  and or any Recreational drug use.  Wear Seat belts while driving.   Please note  You were cared for by a hospitalist during your hospital stay. If you have any questions about your discharge medications or the care you received while you were in the hospital after you are discharged, you can call the unit and asked to speak with the hospitalist on call if the hospitalist that took care of you is not available. Once you are discharged, your primary care physician will handle any further medical issues. Please note that NO REFILLS for any discharge medications will be  authorized once you are discharged, as it is imperative that you return to your primary care physician (or establish a relationship with a primary care physician if you do not have one) for your aftercare needs so that they can reassess your need for medications and monitor your lab values.     Increase activity slowly    Complete by:  As directed             Medication List    TAKE these medications        ALPRAZolam 1 MG tablet  Commonly known as:  XANAX  Take 1 tablet (1 mg total) by mouth at bedtime as needed.     aspirin 325 MG tablet  Take 325 mg by mouth daily after supper.     atorvastatin 40 MG tablet  Commonly known as:  LIPITOR  Take 1 tablet (40 mg total) by mouth daily at 6 PM.     levothyroxine 125 MCG tablet  Commonly known as:  SYNTHROID, LEVOTHROID  Take 1 tablet (125 mcg total) by mouth daily.     losartan-hydrochlorothiazide 50-12.5 MG tablet  Commonly known as:  HYZAAR  Take 1 tablet by mouth daily.     nitroGLYCERIN 0.4 MG SL tablet  Commonly known as:  NITROSTAT  Place 0.4 mg under the tongue every 5 (five) minutes as needed for chest pain.     OVER THE COUNTER MEDICATION  Take 1 tablet by mouth 4 (four) times a week. Heart Healthy Vitamin     VITAMIN B12 PO  Take 1 tablet by mouth daily after supper.     VITAMIN D3 PO  Take 1 tablet by mouth daily after supper.     Vitamin D3 50000 UNITS Caps  Take 1 capsule by mouth every 7 (seven) days.     zoster vaccine live (PF) 19400 UNT/0.65ML injection  Commonly  known as:  ZOSTAVAX  Inject 19,400 Units into the skin once.          Diet and Activity recommendation: See Discharge Instructions above   Consults obtained -  Neurology   Major procedures and Radiology Reports - PLEASE review detailed and final reports for all details, in brief -     Dg Chest 2 View  08/10/2015  CLINICAL DATA:  Left extremity weakness.  The hypertension. EXAM: CHEST  2 VIEW COMPARISON:  02/07/2012 FINDINGS:  Heart size is normal. No pleural effusion or edema. No airspace consolidation. Coarsened interstitial markings of emphysema identified. IMPRESSION: 1. No acute cardiopulmonary abnormality. 2. Emphysema. Electronically Signed   By: Kerby Moors M.D.   On: 08/10/2015 09:39   Ct Head Wo Contrast  08/09/2015  CLINICAL DATA:  74 year old female with right sided weakness. EXAM: CT HEAD WITHOUT CONTRAST TECHNIQUE: Contiguous axial images were obtained from the base of the skull through the vertex without intravenous contrast. COMPARISON:  MRI dated 03/29/2015 and CT dated 02/07/2012 FINDINGS: The ventricles are dilated and the sulci are prominent compatible with age-related atrophy. Periventricular and deep white matter hypodensities represent chronic microvascular ischemic changes. There is no intracranial hemorrhage. No mass effect or midline shift identified. There is diffuse mucoperiosteal thickening of right maxillary sinus. The remainder of the visualized paranasal sinuses and mastoid air cells are well aerated. The calvarium is intact. IMPRESSION: No acute intracranial hemorrhage. Age-related atrophy and chronic microvascular ischemic disease. If symptoms persist and there are no contraindications, MRI may provide better evaluation if clinically indicated. Electronically Signed   By: Anner Crete M.D.   On: 08/09/2015 20:07   Mr Brain Wo Contrast  08/10/2015  CLINICAL DATA:  Initial evaluation for intermittent right-sided weakness. EXAM: MRI HEAD WITHOUT CONTRAST MRA HEAD WITHOUT CONTRAST TECHNIQUE: Multiplanar, multiecho pulse sequences of the brain and surrounding structures were obtained without intravenous contrast. Angiographic images of the head were obtained using MRA technique without contrast. COMPARISON:  Prior CT from 08/09/2015 as well as previous MRI from 03/29/2015. FINDINGS: MRI HEAD FINDINGS Generalized age-related cerebral atrophy present. Patchy and confluent T2/FLAIR hyperintensity  within the periventricular and deep white matter both cerebral hemispheres present, most consistent with moderate chronic small vessel ischemic disease. There are superimposed remote lacunar infarcts within the left basal ganglia/ corona radiata. No abnormal foci of restricted diffusion to suggest acute intracranial infarct. Gray-white matter differentiation maintained. Normal intracranial vascular flow voids are preserved. No acute or chronic intracranial hemorrhage. No mass lesion, midline shift, or mass effect. Ventricular prominence related to global parenchymal volume loss present without hydrocephalus. No extra-axial fluid collection. Craniocervical junction within normal limits. Pituitary gland normal.  No acute abnormality about the orbits. Mild mucosal thickening within the right maxillary sinus and ethmoidal air cells. Paranasal sinuses are otherwise clear. Small right mastoid effusion noted, likely benign. Inner ear structures within normal limits. Bone marrow signal intensity within normal limits. No scalp soft tissue abnormality. MRA HEAD FINDINGS ANTERIOR CIRCULATION: Visualized distal cervical segments of the internal carotid arteries are patent with antegrade flow. Right ICA is slightly larger than the left. Petrous, cavernous, and supraclinoid segments widely patent. Right A1 segment widely patent. Left A1 segment hypoplastic but patent. Anterior communicating artery normal. Anterior cerebral arteries well opacified. Single short segment mild stenosis within the proximal right M1 segment (series 501, image 12). Right M1 segment otherwise widely patent to the bifurcation. Right MCA bifurcation itself within normal limits. Right MCA branches distally are well opacified. Left M1  segment widely patent without stenosis or occlusion. Left MCA bifurcation normal. Short-segment moderate stenosis within proximal left M2 branch at the base of the sylvian fissure (Series 501, image 12). Left MCA branches are  well opacified distally. POSTERIOR CIRCULATION: Vertebral arteries widely patent to the vertebrobasilar junction. Posterior inferior cerebral arteries patent bilaterally. Basilar artery widely patent to its distal aspect. Small left anterior inferior cerebral artery noted. Superior cerebral arteries patent bilaterally. There are 2 left-sided SCAs. Both of the posterior cerebral arteries arise the basilar artery and are well opacified to their distal aspects. Short-segment stenoses within the P2 segments bilaterally, fairly severe in nature, perhaps slightly worse on the left (series 506, image 16). Additional atheromatous irregularity within the left P2 segment proximally. No aneurysm or vascular malformation. IMPRESSION: MRI HEAD IMPRESSION: 1. No acute intracranial infarct or other process identified. 2. 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 3. Age-related cerebral atrophy with moderate chronic microvascular ischemic disease. MRA HEAD IMPRESSION: 1. No large or proximal arterial branch occlusion within the intracranial circulation. No correctable stenosis. 2. Short-segment moderate stenosis within a proximal left M2 branch, with additional short-segment mild stenosis within the proximal right M1 segment. Anterior circulation otherwise widely patent. 3. Short-segment severe stenoses within the mid -distal P2 segments bilaterally. Electronically Signed   By: Jeannine Boga M.D.   On: 08/10/2015 06:04   Mr Jodene Nam Head/brain Wo Cm  08/10/2015  CLINICAL DATA:  Initial evaluation for intermittent right-sided weakness. EXAM: MRI HEAD WITHOUT CONTRAST MRA HEAD WITHOUT CONTRAST TECHNIQUE: Multiplanar, multiecho pulse sequences of the brain and surrounding structures were obtained without intravenous contrast. Angiographic images of the head were obtained using MRA technique without contrast. COMPARISON:  Prior CT from 08/09/2015 as well as previous MRI from 03/29/2015.  FINDINGS: MRI HEAD FINDINGS Generalized age-related cerebral atrophy present. Patchy and confluent T2/FLAIR hyperintensity within the periventricular and deep white matter both cerebral hemispheres present, most consistent with moderate chronic small vessel ischemic disease. There are superimposed remote lacunar infarcts within the left basal ganglia/ corona radiata. No abnormal foci of restricted diffusion to suggest acute intracranial infarct. Gray-white matter differentiation maintained. Normal intracranial vascular flow voids are preserved. No acute or chronic intracranial hemorrhage. No mass lesion, midline shift, or mass effect. Ventricular prominence related to global parenchymal volume loss present without hydrocephalus. No extra-axial fluid collection. Craniocervical junction within normal limits. Pituitary gland normal.  No acute abnormality about the orbits. Mild mucosal thickening within the right maxillary sinus and ethmoidal air cells. Paranasal sinuses are otherwise clear. Small right mastoid effusion noted, likely benign. Inner ear structures within normal limits. Bone marrow signal intensity within normal limits. No scalp soft tissue abnormality. MRA HEAD FINDINGS ANTERIOR CIRCULATION: Visualized distal cervical segments of the internal carotid arteries are patent with antegrade flow. Right ICA is slightly larger than the left. Petrous, cavernous, and supraclinoid segments widely patent. Right A1 segment widely patent. Left A1 segment hypoplastic but patent. Anterior communicating artery normal. Anterior cerebral arteries well opacified. Single short segment mild stenosis within the proximal right M1 segment (series 501, image 12). Right M1 segment otherwise widely patent to the bifurcation. Right MCA bifurcation itself within normal limits. Right MCA branches distally are well opacified. Left M1 segment widely patent without stenosis or occlusion. Left MCA bifurcation normal. Short-segment moderate  stenosis within proximal left M2 branch at the base of the sylvian fissure (Series 501, image 12). Left MCA branches are well opacified distally. POSTERIOR CIRCULATION: Vertebral arteries widely patent  to the vertebrobasilar junction. Posterior inferior cerebral arteries patent bilaterally. Basilar artery widely patent to its distal aspect. Small left anterior inferior cerebral artery noted. Superior cerebral arteries patent bilaterally. There are 2 left-sided SCAs. Both of the posterior cerebral arteries arise the basilar artery and are well opacified to their distal aspects. Short-segment stenoses within the P2 segments bilaterally, fairly severe in nature, perhaps slightly worse on the left (series 506, image 16). Additional atheromatous irregularity within the left P2 segment proximally. No aneurysm or vascular malformation. IMPRESSION: MRI HEAD IMPRESSION: 1. No acute intracranial infarct or other process identified. 2. 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 3. Age-related cerebral atrophy with moderate chronic microvascular ischemic disease. MRA HEAD IMPRESSION: 1. No large or proximal arterial branch occlusion within the intracranial circulation. No correctable stenosis. 2. Short-segment moderate stenosis within a proximal left M2 branch, with additional short-segment mild stenosis within the proximal right M1 segment. Anterior circulation otherwise widely patent. 3. Short-segment severe stenoses within the mid -distal P2 segments bilaterally. Electronically Signed   By: Jeannine Boga M.D.   On: 08/10/2015 06:04    Micro Results     No results found for this or any previous visit (from the past 240 hour(s)).     Today   Subjective:   Colleen Wright today has no headache,no chest abdominal pain,no new weakness tingling or numbness, feels much better wants to go home today.   Objective:   Blood pressure 124/55, pulse 70, temperature 97.7 F  (36.5 C), temperature source Oral, resp. rate 18, height 5\' 2"  (1.575 m), weight 67.132 kg (148 lb), SpO2 98 %.   Intake/Output Summary (Last 24 hours) at 08/11/15 0956 Last data filed at 08/10/15 1700  Gross per 24 hour  Intake    240 ml  Output      0 ml  Net    240 ml    Exam Awake Alert, Oriented x 3, No new F.N deficits, Normal affect [place].AT,PERRAL Supple Neck,No JVD, No cervical lymphadenopathy appriciated.  Symmetrical Chest wall movement, Good air movement bilaterally, CTAB RRR,No Gallops,Rubs or new Murmurs, No Parasternal Heave +ve B.Sounds, Abd Soft, Non tender, No organomegaly appriciated, No rebound -guarding or rigidity. No Cyanosis, Clubbing or edema, No new Rash or bruise  Data Review   CBC w Diff: Lab Results  Component Value Date   WBC 8.2 08/09/2015   WBC 6.9 09/19/2007   HGB 12.2 08/09/2015   HGB 13.8 09/19/2007   HCT 36.8 08/09/2015   HCT 38.9 09/19/2007   PLT 214 08/09/2015   PLT 256 09/19/2007   LYMPHOPCT 32.5 06/21/2015   LYMPHOPCT 36.9 09/19/2007   MONOPCT 7.8 06/21/2015   MONOPCT 6.6 09/19/2007   EOSPCT 6.8* 06/21/2015   EOSPCT 3.3 09/19/2007   BASOPCT 0.6 06/21/2015   BASOPCT 0.3 09/19/2007    CMP: Lab Results  Component Value Date   NA 139 08/09/2015   K 3.3* 08/09/2015   CL 105 08/09/2015   CO2 26 08/09/2015   BUN 31* 08/09/2015   CREATININE 1.34* 08/09/2015   CREATININE 1.08 03/24/2013   PROT 7.8 08/09/2015   ALBUMIN 3.9 08/09/2015   BILITOT 0.6 08/09/2015   ALKPHOS 83 08/09/2015   AST 24 08/09/2015   ALT 18 08/09/2015  .   Total Time in preparing paper work, data evaluation and todays exam - 35 minutes  Bronwyn Belasco M.D on 08/11/2015 at 9:56 AM  [facility]   Office  [phone]

## 2015-08-13 ENCOUNTER — Telehealth: Payer: Self-pay | Admitting: Family Medicine

## 2015-08-13 LAB — HEMOGLOBIN A1C
HEMOGLOBIN A1C: 6.1 % — AB (ref 4.8–5.6)
MEAN PLASMA GLUCOSE: 128 mg/dL

## 2015-08-13 NOTE — Telephone Encounter (Signed)
Spoke to patient regarding recent hospital admission.  Tried doing TCM but patient refused appointment.  She states that she has an appointment with her heart doctor and she is going to just follow up with him.  She didn't feel any need to follow up with Dr Raoul Pitch at this time.

## 2015-08-13 NOTE — Telephone Encounter (Signed)
I looked deeper into patient's chart, she doesn't have a f/u with her cardiology until 09/13/15.  There are labs and CXR that need done prior to that which is why she is to f/u with PCP.   Called patient again to try to convince patient to come into office and reminded her that per her discharge paper work that she is to have blood work and x-rays.  Pt states she will come in but can not schedule appointment at this time.  She will CB to schedule within 7 days.

## 2015-08-13 NOTE — Telephone Encounter (Signed)
Transition Care Management Follow-up Telephone Call   Date discharged? 08/11/15   How have you been since you were released from the hospital? She feels okay   Do you understand why you were in the hospital? yes   Do you understand the discharge instructions? no, she states she wasn't told anything about doing repeat labs / x-rays.   Where were you discharged to? Home   Items Reviewed:  Medications reviewed: yes  Allergies reviewed: no  Dietary changes reviewed: yes  Referrals reviewed: no   Functional Questionnaire:   Activities of Daily Living (ADLs):   She states they are independent in the following: None States they require assistance with the following: feeding / ambulation per her D/C summary   Any transportation issues/concerns?: no   Any patient concerns? no   Confirmed importance and date/time of follow-up visits scheduled yes, however patient would not schedule at this time because she keeps her grandbaby and doesn't know when she will be able to get into the office.  She states that she will follow up within 7 days though.    Provider Appointment booked with Dr. Raoul Pitch- pt will CB to schedule within 7 days.   Confirmed with patient if condition begins to worsen call PCP or go to the ER.  Patient was given the office number and encouraged to call back with question or concerns.  : yes, but patient was unable to schedule at this time.

## 2015-08-14 ENCOUNTER — Ambulatory Visit (INDEPENDENT_AMBULATORY_CARE_PROVIDER_SITE_OTHER): Payer: Medicare Other | Admitting: Family Medicine

## 2015-08-14 VITALS — BP 144/82 | HR 73 | Temp 97.9°F | Resp 20 | Wt 147.2 lb

## 2015-08-14 DIAGNOSIS — I25119 Atherosclerotic heart disease of native coronary artery with unspecified angina pectoris: Secondary | ICD-10-CM | POA: Diagnosis not present

## 2015-08-14 DIAGNOSIS — E876 Hypokalemia: Secondary | ICD-10-CM

## 2015-08-14 DIAGNOSIS — J439 Emphysema, unspecified: Secondary | ICD-10-CM

## 2015-08-14 DIAGNOSIS — G459 Transient cerebral ischemic attack, unspecified: Secondary | ICD-10-CM

## 2015-08-14 DIAGNOSIS — Z9289 Personal history of other medical treatment: Secondary | ICD-10-CM

## 2015-08-14 LAB — CBC WITH DIFFERENTIAL/PLATELET
Basophils Absolute: 0 10*3/uL (ref 0.0–0.1)
Basophils Relative: 0 % (ref 0–1)
Eosinophils Absolute: 0.2 10*3/uL (ref 0.0–0.7)
Eosinophils Relative: 3 % (ref 0–5)
HCT: 36.8 % (ref 36.0–46.0)
HEMOGLOBIN: 12.5 g/dL (ref 12.0–15.0)
LYMPHS PCT: 37 % (ref 12–46)
Lymphs Abs: 2.6 10*3/uL (ref 0.7–4.0)
MCH: 29.2 pg (ref 26.0–34.0)
MCHC: 34 g/dL (ref 30.0–36.0)
MCV: 86 fL (ref 78.0–100.0)
MONOS PCT: 6 % (ref 3–12)
MPV: 10.4 fL (ref 8.6–12.4)
Monocytes Absolute: 0.4 10*3/uL (ref 0.1–1.0)
NEUTROS ABS: 3.8 10*3/uL (ref 1.7–7.7)
NEUTROS PCT: 54 % (ref 43–77)
Platelets: 213 10*3/uL (ref 150–400)
RBC: 4.28 MIL/uL (ref 3.87–5.11)
RDW: 13.8 % (ref 11.5–15.5)
WBC: 7 10*3/uL (ref 4.0–10.5)

## 2015-08-14 LAB — COMPREHENSIVE METABOLIC PANEL
ALBUMIN: 3.9 g/dL (ref 3.6–5.1)
ALK PHOS: 78 U/L (ref 33–130)
ALT: 17 U/L (ref 6–29)
AST: 22 U/L (ref 10–35)
BUN: 28 mg/dL — ABNORMAL HIGH (ref 7–25)
CHLORIDE: 102 mmol/L (ref 98–110)
CO2: 26 mmol/L (ref 20–31)
CREATININE: 1.06 mg/dL — AB (ref 0.60–0.93)
Calcium: 9.4 mg/dL (ref 8.6–10.4)
Glucose, Bld: 100 mg/dL — ABNORMAL HIGH (ref 65–99)
POTASSIUM: 3.9 mmol/L (ref 3.5–5.3)
Sodium: 140 mmol/L (ref 135–146)
TOTAL PROTEIN: 7.1 g/dL (ref 6.1–8.1)
Total Bilirubin: 0.4 mg/dL (ref 0.2–1.2)

## 2015-08-14 NOTE — Progress Notes (Signed)
Subjective:    Patient ID: Colleen Wright, female    DOB: Mar 10, 1941, 74 y.o.   MRN: [id number]  HPI   Patient presents for transitional care management follow-up after recent hospitalization for transient ischemic attack. Patient was admitted on 08/10/2015 and discharged on 08/11/2015. Patient's principal problem list during admission was TIA, hypertension, right-sided weakness, hypokalemia, hyperlipidemia.  Patient states that they called her and told her she needed to come and have a chest x-ray completed today, it do not see this request in any of her admission notes or discharge instruction. Of note patient did have a chest x-ray, what appears to be admission to the hospital, which resulted with no acute cardiopulmonary abnormality, emphysema present. Patient denies any shortness of breath, she states she is wondered at times, but nothing currently. She was unaware of the chest x-ray results and states they did not tell her anything in the hospital. Patient is not currently on any type of inhaler medications, and has never followed with pulmonology.  Patient was admitted secondary to right-sided weakness, MRI  imaging revealed no large or proximal arterial branch occlusion within the intracranial circulation, no correctable stenosis, short segment moderate stenosis within a proximal left M2 branch, with additional short segment mild stenosis within the proximal right M1 segment. Anterior circulation otherwise widely patent. Short segment severe stenosis within the mid-distal P2 segments bilaterally. No acute intracranial infarct or other process identified.  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 Age-related cerebral atrophy with moderate chronic microvascular ischemic disease. Patient states she had multiple attacks over a couple days of right-sided weakness that concerned her.2-D echo EF 55-60%, no regional wall motion abnormality, no evidence of embolic  source  Carotid Doppler studies indicate 1-39% right ICA stenosis. 40-59% left ICA stenosis, highest end of range. Vertebral artery flow is antegrade. TEE with no evidence of seizure activity . LDL is 124, and patient was started on Lipitor 40 mg daily, which she reports compliance. She has been symptom-free since her discharge. She reports compliance with aspirin 325 mg daily. Patient is to follow-up with neurology in 2 months, she has had to make this appointment.   Patient was found to have mild hypokalemia with potassium of 3.3, she was supplemented appropriately. Follow-up BMP in the outpatient setting resulted with normal potassium of 4.1. She was encouraged to continue supplementation.  Elevated A1c was found on admission of 6.1. Patient is not a diabetic, currently not on any medications.   08/10/2015   EXAM: MRI HEAD WITHOUT CONTRAST MRA HEAD WITHOUT CONTRAST TECHNIQUE:MRA HEAD IMPRESSION: 1. No large or proximal arterial branch occlusion within the intracranial circulation. No correctable stenosis. 2. Short-segment moderate stenosis within a proximal left M2 branch, with additional short-segment mild stenosis within the proximal right M1 segment. Anterior circulation otherwise widely patent. 3. Short-segment severe stenoses within the mid -distal P2 segments bilaterally. Electronically Signed   By: Jeannine Boga M.D.   On: 08/10/2015 06:04   08/10/2015  CLINICAL DATA:  Initial evaluation for intermittent right-sided weakness. EXAM: MRI HEAD WITHOUT CONTRAST MRA HEAD WITHOUT CONTRAST TECHNIQUE:   HEAD FINDINGS ANTERIOR CIRCULATION: Visualized distal cervical segments of the internal carotid arteries are patent with antegrade flow. Right ICA is slightly larger than the left. Petrous, cavernous, and supraclinoid segments widely patent. Right A1 segment widely patent. Left A1 segment hypoplastic but patent. Anterior communicating artery normal. Anterior cerebral arteries well opacified.  Single short segment mild stenosis within the proximal right M1  segment (series 501, image 12). Right M1 segment otherwise widely patent to the bifurcation. Right MCA bifurcation itself within normal limits. Right MCA branches distally are well opacified. Left M1 segment widely patent without stenosis or occlusion. Left MCA bifurcation normal. Short-segment moderate stenosis within proximal left M2 branch at the base of the sylvian fissure (Series 501, image 12). Left MCA branches are well opacified distally.  POSTERIOR CIRCULATION: Vertebral arteries widely patent to the vertebrobasilar junction. Posterior inferior cerebral arteries patent bilaterally. Basilar artery widely patent to its distal aspect. Small left anterior inferior cerebral artery noted. Superior cerebral arteries patent bilaterally. There are 2 left-sided SCAs. Both of the posterior cerebral arteries arise the basilar artery and are well opacified to their distal aspects. Short-segment stenoses within the P2 segments bilaterally, fairly severe in nature, perhaps slightly worse on the left (series 506, image 16). Additional atheromatous irregularity within the left P2 segment proximally. No aneurysm or vascular malformation.  IMPRESSION: MRI HEAD IMPRESSION: 1. No acute intracranial infarct or other process identified. 2. 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 3. Age-related cerebral atrophy with moderate chronic microvascular ischemic disease.  MRA HEAD IMPRESSION: 1. No large or proximal arterial branch occlusion within the intracranial circulation. No correctable stenosis. 2. Short-segment moderate stenosis within a proximal left M2 branch, with additional short-segment mild stenosis within the proximal right M1 segment. Anterior circulation otherwise widely patent. 3. Short-segment severe stenoses within the mid -distal P2 segments bilaterally. Electronically Signed   By: Jeannine Boga M.D.    On: 08/10/2015 06:04   08/10/2015 CHEST  2 VIEW COMPARISON: 1. No acute cardiopulmonary abnormality. 2. Emphysema. Electronically Signed   By: Kerby Moors M.D.   On: 08/10/2015 09:39  Past Medical History  Diagnosis Date  . Colon polyps   . Allergy   . Arthritis     low back  . Cancer [facility])     thyroid, s/p thyroidectomy 8 years ago  . Depression   . GERD (gastroesophageal reflux disease)   . Hyperlipidemia   . Hypertension   . Ulcer   . Hypothyroidism 06/12/2011  . Hiatal hernia   . CAD (coronary artery disease) 06/12/2011  . Back pain 06/13/2011  . Vertigo 06/13/2011  . Leg fracture   . Thyroid cancer ([place])   . Skin lesions, generalized 05/23/2012  . Pain of left heel 05/23/2012  . Pain in joint, ankle and foot 11/02/2012   Allergies  Allergen Reactions  . Amoxicillin Other (See Comments)    Patient develops a very bad yeast infection.   Past Surgical History  Procedure Laterality Date  . Thyroidectomy, partial      twice  . Fracture surgery      left leg, pin and 5 bolts in place in lower tibia  . Tubal ligation      growth removed from posterior vaginal vault  . Lesion removal      vaginal  . Balloon angioplasty, artery     Family History  Problem Relation Age of Onset  . Cancer Mother     ovary/uterus-can't remember  . Stroke Mother   . Heart disease Father   . Heart disease Sister     bypass  . Stroke Sister   . Cancer Sister     LN, bone  . Hypertension Sister   . Heart disease Brother     bypass  . Cancer Cousin     breast   Social History   Social History  .  Marital Status: Married    Spouse Name: N/A  . Number of Children: N/A  . Years of Education: N/A   Occupational History  . Not on file.   Social History Main Topics  . Smoking status: Former Research scientist (life sciences)  . Smokeless tobacco: Never Used  . Alcohol Use: No  . Drug Use: No  . Sexual Activity: Not Currently   Other Topics Concern  . Not on file   Social History Narrative     Review of Systems Negative, with the exception of above mentioned in HPI     Objective:   Physical Exam BP 144/82 mmHg  Pulse 73  Temp(Src) 97.9 F (36.6 C) (Temporal)  Resp 20  Wt 147 lb 4 oz (66.792 kg)  SpO2 99% Gen: Afebrile. No acute distress. Nontoxic appearance, well-developed, well-nourished, Caucasian female. Very pleasant. Appears well today. HENT: AT. [place]. Bilateral TM visualized and normal in appearance. MMM. Bilateral nares without erythema or swelling. Throat without erythema or exudates. No cough on exam, no hoarseness on exam. Eyes:Pupils Equal Round Reactive to light, Extraocular movements intact,  Conjunctiva without redness, discharge or icterus. Neck/lymp/endocrine: Supple, no lymphadenopathy CV: RRR, no edema, +2/4 P posterior tibialis pulses Chest: CTAB, no wheeze or crackles Abd: Soft. Round. NTND. BS present. No Masses palpated.  Skin: No rashes, purpura or petechiae. Skin intact Neuro: Normal gait. PERLA. EOMi. Alert. Oriented x3. Cranial nerves II through XII intact. Muscle strength 5/5 upper and lower extremity. DTRs equal bilaterally. Psych: Normal affect, dress and demeanor. Normal speech. Normal thought content and judgment.     Assessment & Plan:  Patient presents for follow-up after recent hospital admission for TIA. 1. Hypokalemia - Potassium 4.1 today. Patient encouraged to continue supplementation. - Basic Metabolic Panel (BMET) - CBC w/Diff  2. Pulmonary emphysema, unspecified emphysema type ([place]) - Discussed briefly emphysema with patient today, she doesn't seem to have any symptoms from her emphysema as far as clinical symptoms go. Discussed establishing with pulmonology and considering pulmonary function testing. Patient amendable to this referral. - Ambulatory referral to Pulmonology  3. History of recent hospitalization - Referral patient doing well after recent hospitalization for TIA, she is taking the appropriate medications  suggested to her from her admission. She has yet to make neurology follow-up, this was encouraged for her to call their office and make follow-up appointment with him.  Follow up on other chronic issues as schedule.

## 2015-08-14 NOTE — Patient Instructions (Signed)
Make certain to take medications as directed. I will make a pulmonology referral for you to discuss your emphysema and get a pulmonary test. 325 mg Aspirin daily and lipitor.  I will call your with your labs once available Please follow up with your cardiologist and neurologist as they had asked you to schedule.   Transient Ischemic Attack A transient ischemic attack (TIA) is a "warning stroke" that causes stroke-like symptoms. Unlike a stroke, a TIA does not cause permanent damage to the brain. The symptoms of a TIA can happen very fast and do not last long. It is important to know the symptoms of a TIA and what to do. This can help prevent a major stroke or death. CAUSES  A TIA is caused by a temporary blockage in an artery in the brain or neck (carotid artery). The blockage does not allow the brain to get the blood supply it needs and can cause different symptoms. The blockage can be caused by either:  A blood clot.  Fatty buildup (plaque) in a neck or brain artery. RISK FACTORS  High blood pressure (hypertension).  High cholesterol.  Diabetes mellitus.  Heart disease.  The buildup of plaque in the blood vessels (peripheral artery disease or atherosclerosis).  The buildup of plaque in the blood vessels that provide blood and oxygen to the brain (carotid artery stenosis).  An abnormal heart rhythm (atrial fibrillation).  Obesity.  Using any tobacco products, including cigarettes, chewing tobacco, or electronic cigarettes.  Taking oral contraceptives, especially in combination with using tobacco.  Physical inactivity.  A diet high in fats, salt (sodium), and calories.  Excessive alcohol use.  Use of illegal drugs (especially cocaine and methamphetamine).  Being female.  Being African American.  Being over the age of 20 years.  Family history of stroke.  Previous history of blood clots, stroke, TIA, or heart attack.  Sickle cell disease. SIGNS AND SYMPTOMS  TIA  symptoms are the same as a stroke but are temporary. These symptoms usually develop suddenly, or may be newly present upon waking from sleep:  Sudden weakness or numbness of the face, arm, or leg, especially on one side of the body.  Sudden trouble walking or difficulty moving arms or legs.  Sudden confusion.  Sudden personality changes.  Trouble speaking (aphasia) or understanding.  Difficulty swallowing.  Sudden trouble seeing in one or both eyes.  Double vision.  Dizziness.  Loss of balance or coordination.  Sudden severe headache with no known cause.  Trouble reading or writing.  Loss of bowel or bladder control.  Loss of consciousness. DIAGNOSIS  Your health care provider may be able to determine the presence or absence of a TIA based on your symptoms, history, and physical exam. CT scan of the brain is usually performed to help identify a TIA. Other tests may include:  Electrocardiography (ECG).  Continuous heart monitoring.  Echocardiography.  Carotid ultrasonography.  MRI.  A scan of the brain circulation.  Blood tests. TREATMENT  Since the symptoms of TIA are the same as a stroke, it is important to seek treatment as soon as possible. You may need a medicine to dissolve a blood clot (thrombolytic) if that is the cause of the TIA. This medicine cannot be given if too much time has passed. Treatment may also include:   Rest, oxygen, fluids through an IV tube, and medicines to thin the blood (anticoagulants).  Measures will be taken to prevent short-term and long-term complications, including infection from breathing foreign material  into the lungs (aspiration pneumonia), blood clots in the legs, and falls.  Procedures to either remove plaque in the carotid arteries or dilate carotid arteries that have narrowed due to plaque. Those procedures are:  Carotid endarterectomy.  Carotid angioplasty and stenting.  Medicines and diet may be used to address  diabetes, high blood pressure, and other underlying risk factors. HOME CARE INSTRUCTIONS   Take medicines only as directed by your health care provider. Follow the directions carefully. Medicines may be used to control risk factors for a stroke. Be sure you understand all your medicine instructions.  You may be told to take aspirin or the anticoagulant warfarin. Warfarin needs to be taken exactly as instructed.  Taking too much or too little warfarin is dangerous. Too much warfarin increases the risk of bleeding. Too little warfarin continues to allow the risk for blood clots. While taking warfarin, you will need to have regular blood tests to measure your blood clotting time. A PT blood test measures how long it takes for blood to clot. Your PT is used to calculate another value called an INR. Your PT and INR help your health care provider to adjust your dose of warfarin. The dose can change for many reasons. It is critically important that you take warfarin exactly as prescribed.  Many foods, especially foods high in vitamin K can interfere with warfarin and affect the PT and INR. Foods high in vitamin K include spinach, kale, broccoli, cabbage, collard and turnip greens, Brussels sprouts, peas, cauliflower, seaweed, and parsley, as well as beef and pork liver, green tea, and soybean oil. You should eat a consistent amount of foods high in vitamin K. Avoid major changes in your diet, or notify your health care provider before changing your diet. Arrange a visit with a dietitian to answer your questions.  Many medicines can interfere with warfarin and affect the PT and INR. You must tell your health care provider about any and all medicines you take; this includes all vitamins and supplements. Be especially cautious with aspirin and anti-inflammatory medicines. Do not take or discontinue any prescribed or over-the-counter medicine except on the advice of your health care provider or  pharmacist.  Warfarin can have side effects, such as excessive bruising or bleeding. You will need to hold pressure over cuts for longer than usual. Your health care provider or pharmacist will discuss other potential side effects.  Avoid sports or activities that may cause injury or bleeding.  Be careful when shaving, flossing your teeth, or handling sharp objects.  Alcohol can change the body's ability to handle warfarin. It is best to avoid alcoholic drinks or consume only very small amounts while taking warfarin. Notify your health care provider if you change your alcohol intake.  Notify your dentist or other health care providers before procedures.  Eat a diet that includes 5 or more servings of fruits and vegetables each day. This may reduce the risk of stroke. Certain diets may be prescribed to address high blood pressure, high cholesterol, diabetes, or obesity.  A diet low in sodium, saturated fat, trans fat, and cholesterol is recommended to manage high blood pressure.  A diet low in saturated fat, trans fat, and cholesterol, and high in fiber may control cholesterol levels.  A controlled-carbohydrate, controlled-sugar diet is recommended to manage diabetes.  A reduced-calorie diet that is low in sodium, saturated fat, trans fat, and cholesterol is recommended to manage obesity.  Maintain a healthy weight.  Stay physically active. It is  recommended that you get at least 30 minutes of activity on most or all days.  Do not use any tobacco products, including cigarettes, chewing tobacco, or electronic cigarettes. If you need help quitting, ask your health care provider.  Limit alcohol intake to no more than 1 drink per day for nonpregnant women and 2 drinks per day for men. One drink equals 12 ounces of beer, 5 ounces of wine, or 1 ounces of hard liquor.  Do not abuse drugs.  A safe home environment is important to reduce the risk of falls. Your health care provider may arrange  for specialists to evaluate your home. Having grab bars in the bedroom and bathroom is often important. Your health care provider may arrange for equipment to be used at home, such as raised toilets and a seat for the shower.  Follow all instructions for follow-up with your health care provider. This is very important. This includes any referrals and lab tests. Proper follow-up can prevent a stroke or another TIA from occurring. PREVENTION  The risk of a TIA can be decreased by appropriately treating high blood pressure, high cholesterol, diabetes, heart disease, and obesity, and by quitting smoking, limiting alcohol, and staying physically active. SEEK MEDICAL CARE IF:  You have personality changes.  You have difficulty swallowing.  You are seeing double.  You have dizziness.  You have a fever. SEEK IMMEDIATE MEDICAL CARE IF:  Any of the following symptoms may represent a serious problem that is an emergency. Do not wait to see if the symptoms will go away. Get medical help right away. Call your local emergency services (911 in [place] Do not drive yourself to the hospital.  You have sudden weakness or numbness of the face, arm, or leg, especially on one side of the body.  You have sudden trouble walking or difficulty moving arms or legs.  You have sudden confusion.  You have trouble speaking (aphasia) or understanding.  You have sudden trouble seeing in one or both eyes.  You have a loss of balance or coordination.  You have a sudden, severe headache with no known cause.  You have new chest pain or an irregular heartbeat.  You have a partial or total loss of consciousness. MAKE SURE YOU:   Understand these instructions.  Will watch your condition.  Will get help right away if you are not doing well or get worse.   This information is not intended to replace advice given to you by your health care provider. Make sure you discuss any questions you have with your health care  provider.   Document Released: 05/13/2005 Document Revised: 08/24/2014 Document Reviewed: 11/08/2013 Elsevier Interactive Patient Education [employer].

## 2015-08-15 ENCOUNTER — Telehealth: Payer: Self-pay | Admitting: Family Medicine

## 2015-08-15 LAB — BASIC METABOLIC PANEL
BUN: 30 mg/dL — ABNORMAL HIGH (ref 6–23)
CO2: 27 mEq/L (ref 19–32)
Calcium: 9.6 mg/dL (ref 8.4–10.5)
Chloride: 103 mEq/L (ref 96–112)
Creatinine, Ser: 1.16 mg/dL (ref 0.40–1.20)
GFR: 48.22 mL/min — ABNORMAL LOW (ref >60.00)
Glucose, Bld: 105 mg/dL — ABNORMAL HIGH (ref 70–99)
Potassium: 4.1 mEq/L (ref 3.5–5.1)
Sodium: 140 mEq/L (ref 135–145)

## 2015-08-15 NOTE — Telephone Encounter (Signed)
Spoke with patient reviewed lab results and instructions. 

## 2015-08-15 NOTE — Telephone Encounter (Signed)
Please call his patients, her labs are stable. Her potassium is normal. She should continue to eat potassium rich foods or supplements.

## 2015-08-16 ENCOUNTER — Encounter: Payer: Self-pay | Admitting: Family Medicine

## 2015-08-16 DIAGNOSIS — Z9289 Personal history of other medical treatment: Secondary | ICD-10-CM | POA: Insufficient documentation

## 2015-09-09 ENCOUNTER — Other Ambulatory Visit: Payer: Self-pay | Admitting: *Deleted

## 2015-09-09 MED ORDER — ALPRAZOLAM 1 MG PO TABS: 1.0000 mg | ORAL_TABLET | Freq: Every evening | ORAL | Status: DC | PRN

## 2015-09-09 NOTE — Telephone Encounter (Signed)
Refill request received for xanax. Last refill 06/21/15 for 30 tabs with 1 refill. Patient last seen 08/14/15 . Next scheduled visit here is 02/21/16

## 2015-09-11 ENCOUNTER — Other Ambulatory Visit: Payer: Self-pay | Admitting: *Deleted

## 2015-09-11 DIAGNOSIS — I10 Essential (primary) hypertension: Secondary | ICD-10-CM

## 2015-09-11 MED ORDER — LOSARTAN POTASSIUM-HCTZ 50-12.5 MG PO TABS: 1.0000 | ORAL_TABLET | Freq: Every day | ORAL | Status: DC

## 2015-09-13 ENCOUNTER — Ambulatory Visit (INDEPENDENT_AMBULATORY_CARE_PROVIDER_SITE_OTHER): Payer: Medicare Other | Admitting: Internal Medicine

## 2015-09-13 ENCOUNTER — Encounter: Payer: Self-pay | Admitting: Internal Medicine

## 2015-09-13 VITALS — BP 158/92 | HR 71 | Ht 62.0 in | Wt 148.6 lb

## 2015-09-13 DIAGNOSIS — G459 Transient cerebral ischemic attack, unspecified: Secondary | ICD-10-CM | POA: Diagnosis not present

## 2015-09-13 DIAGNOSIS — I639 Cerebral infarction, unspecified: Secondary | ICD-10-CM

## 2015-09-13 DIAGNOSIS — I2510 Atherosclerotic heart disease of native coronary artery without angina pectoris: Secondary | ICD-10-CM

## 2015-09-13 DIAGNOSIS — R42 Dizziness and giddiness: Secondary | ICD-10-CM

## 2015-09-13 DIAGNOSIS — E785 Hyperlipidemia, unspecified: Secondary | ICD-10-CM

## 2015-09-13 DIAGNOSIS — I10 Essential (primary) hypertension: Secondary | ICD-10-CM

## 2015-09-13 NOTE — Patient Instructions (Signed)
Dr Debara Pickett recommends that you have a loop recorder placed. This will be done as an outpatient procedure at [facility], [facility], [facility].

## 2015-09-13 NOTE — Progress Notes (Signed)
OFFICE NOTE  Chief Complaint:  Neck pain, recent TIA / cryptogenic stroke  Primary Care Physician: Howard Pouch, DO  HPI:  Colleen Wright is a 1 showed female previous he followed by Dr. Nolon Lennert, who saw her last in 2013. Prior to that she had been seen at [facility] cardiology and an angioplasty of a posterior lateral branch in 2009. Since then she's had no recurrent chest pain symptoms. In 2013 she had a repeat Myoview which was low risk. She has however recently had a TIA episode which brought her to the hospital. Workup indicated normal LV function by echo and showed mild to moderate bilateral carotid artery disease, which was not thought to be the source of her symptoms. She had a MRI which showed no acute stroke however there were signs of old lacunar infarcts and volume loss. She denies palpitations, chest pain or worsening shortness of breath. She is complaining of some pain over the posterior neck base that radiates over the shoulders. This is been going on for several weeks around the time when she had her TIA episode. She's not had any further episodes since then.  PMHx:  Past Medical History  Diagnosis Date  . Colon polyps   . Allergy   . Arthritis     low back  . Cancer [facility])     thyroid, s/p thyroidectomy 8 years ago  . Depression   . GERD (gastroesophageal reflux disease)   . Hyperlipidemia   . Hypertension   . Ulcer   . Hypothyroidism 06/12/2011  . Hiatal hernia   . CAD (coronary artery disease) 06/12/2011  . Back pain 06/13/2011  . Vertigo 06/13/2011  . Leg fracture   . Thyroid cancer ([place])   . Skin lesions, generalized 05/23/2012  . Pain of left heel 05/23/2012  . Pain in joint, ankle and foot 11/02/2012    Past Surgical History  Procedure Laterality Date  . Thyroidectomy, partial      twice  . Fracture surgery      left leg, pin and 5 bolts in place in lower tibia  . Tubal ligation      growth removed from posterior vaginal vault  . Lesion removal        vaginal  . Balloon angioplasty, artery      FAMHx:  Family History  Problem Relation Age of Onset  . Cancer Mother     ovary/uterus-can't remember  . Stroke Mother   . Heart disease Father   . Heart disease Sister     bypass  . Stroke Sister   . Cancer Sister     LN, bone  . Hypertension Sister   . Heart disease Brother     bypass  . Cancer Cousin     breast    SOCHx:   reports that she has quit smoking. She has never used smokeless tobacco. She reports that she does not drink alcohol or use illicit drugs.  ALLERGIES:  Allergies  Allergen Reactions  . Amoxicillin Other (See Comments)    Patient develops a very bad yeast infection.    ROS: A comprehensive review of systems was negative except for: Musculoskeletal: positive for neck pain Neurological: positive for Stroke/TIA  HOME MEDS: Current Outpatient Prescriptions  Medication Sig Dispense Refill  . ALPRAZolam (XANAX) 1 MG tablet Take 1 tablet (1 mg total) by mouth at bedtime as needed. 30 tablet 1  . aspirin 325 MG tablet Take 325 mg by mouth daily after supper.     Marland Kitchen  atorvastatin (LIPITOR) 40 MG tablet Take 1 tablet (40 mg total) by mouth daily at 6 PM. 30 tablet 1  . Cholecalciferol (VITAMIN D3 PO) Take 1 tablet by mouth daily after supper.    . Cholecalciferol (VITAMIN D3) 50000 UNITS CAPS Take 1 capsule by mouth every 7 (seven) days. 12 capsule 0  . Cyanocobalamin (VITAMIN B12 PO) Take 1 tablet by mouth daily after supper.    . levothyroxine (SYNTHROID, LEVOTHROID) 125 MCG tablet Take 1 tablet (125 mcg total) by mouth daily. (Patient taking differently: Take 125 mcg by mouth daily after supper. ) 90 tablet 3  . losartan-hydrochlorothiazide (HYZAAR) 50-12.5 MG tablet Take 1 tablet by mouth daily. 90 tablet 1  . nitroGLYCERIN (NITROSTAT) 0.4 MG SL tablet Place 0.4 mg under the tongue every 5 (five) minutes as needed for chest pain.    Marland Kitchen OVER THE COUNTER MEDICATION Take 1 tablet by mouth 4 (four) times a week.  Heart Healthy Vitamin     No current facility-administered medications for this visit.    LABS/IMAGING: No results found for this or any previous visit (from the past 48 hour(s)). No results found.  WEIGHTS: Wt Readings from Last 3 Encounters:  09/13/15 148 lb 9 oz (67.388 kg)  08/14/15 147 lb 4 oz (66.792 kg)  08/10/15 148 lb (67.132 kg)    VITALS: BP 158/92 mmHg  Pulse 71  Ht 5\' 2"  (1.575 m)  Wt 148 lb 9 oz (67.388 kg)  BMI 27.17 kg/m2  EXAM: General appearance: alert and no distress Neck: no carotid bruit and no JVD Lungs: clear to auscultation bilaterally Heart: regular rate and rhythm, S1, S2 normal, no murmur, click, rub or gallop Abdomen: soft, non-tender; bowel sounds normal; no masses,  no organomegaly Extremities: extremities normal, atraumatic, no cyanosis or edema Pulses: 2+ and symmetric Skin: Skin color, texture, turgor normal. No rashes or lesions Neurologic: Grossly normal Psych: Pleasant  EKG: Sinus rhythm with PVCs at 71  ASSESSMENT: 1. 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 Neck pain 3. Coronary artery disease status post remote POBA to the posterior lateral branch in 2009 4. HTN 5. HPL 6. Mild to moderate bilateral carotid artery stenosis  PLAN: 1.   [name] has had a recent TIA event. Workup in the hospital was unrevealing except she does have mild to moderate bilateral carotid artery stenosis. Echo shows normal LV function. She's complaining of some neck base pain which does not sound cardiac in etiology. Is not worse with exertion or relieved by rest. Her MRI did not show any acute stroke however remote lacunar infarcts were noted. This TIA event was associated with significant functional limitation that resolved fairly quickly. I'm concerned about the possibility of cryptogenic causes of stroke including atrial fibrillation. I discussed monitoring with her including a possible 30 day monitor however since the  episodes are very infrequent, we may have difficulty in capturing that. Another option would be an implanted loop recorder which she was much more in favor of. I did discuss the loop recorder device and risks and benefits of the procedure which is minimal and she is willing to undergo it. We'll go ahead and schedule that with Dr. Loletha Grayer my partner. We'll plan follow-up in the office in a few months and I will continue to evaluate any episodes of May, her loop recorder. She will need repeat carotid Dopplers in one year. Blood pressure is high normal today we will reevaluate that when she returns. She is on Lipitor  40 and may need additional adjustment.  Thanks for the kind referral.  Pixie Casino, MD, [facility] Attending Cardiologist [place] 09/13/2015, 6:17 PM

## 2015-09-24 ENCOUNTER — Encounter (HOSPITAL_COMMUNITY)
Admission: RE | Disposition: A | Discharge status: Home / Self Care | Payer: Self-pay | Source: Ambulatory Visit | Attending: Cardiovascular Disease

## 2015-09-24 ENCOUNTER — Ambulatory Visit (HOSPITAL_COMMUNITY)
Admission: RE | Admit: 2015-09-24 | Discharge: 2015-09-24 | Disposition: A | Discharge status: Home / Self Care | Payer: Medicare Other | Source: Ambulatory Visit | Attending: Cardiovascular Disease | Admitting: Cardiovascular Disease

## 2015-09-24 ENCOUNTER — Other Ambulatory Visit: Payer: Self-pay | Admitting: *Deleted

## 2015-09-24 DIAGNOSIS — Z880 Allergy status to penicillin: Secondary | ICD-10-CM | POA: Insufficient documentation

## 2015-09-24 DIAGNOSIS — I638 Other cerebral infarction: Secondary | ICD-10-CM | POA: Diagnosis not present

## 2015-09-24 DIAGNOSIS — I639 Cerebral infarction, unspecified: Secondary | ICD-10-CM | POA: Diagnosis not present

## 2015-09-24 DIAGNOSIS — I10 Essential (primary) hypertension: Secondary | ICD-10-CM | POA: Diagnosis not present

## 2015-09-24 DIAGNOSIS — Z7982 Long term (current) use of aspirin: Secondary | ICD-10-CM | POA: Diagnosis not present

## 2015-09-24 DIAGNOSIS — K219 Gastro-esophageal reflux disease without esophagitis: Secondary | ICD-10-CM | POA: Diagnosis not present

## 2015-09-24 DIAGNOSIS — Z823 Family history of stroke: Secondary | ICD-10-CM | POA: Diagnosis not present

## 2015-09-24 DIAGNOSIS — Z86010 Personal history of colonic polyps: Secondary | ICD-10-CM | POA: Diagnosis not present

## 2015-09-24 DIAGNOSIS — G459 Transient cerebral ischemic attack, unspecified: Secondary | ICD-10-CM | POA: Diagnosis present

## 2015-09-24 DIAGNOSIS — Z8673 Personal history of transient ischemic attack (TIA), and cerebral infarction without residual deficits: Secondary | ICD-10-CM | POA: Insufficient documentation

## 2015-09-24 DIAGNOSIS — E890 Postprocedural hypothyroidism: Secondary | ICD-10-CM | POA: Diagnosis not present

## 2015-09-24 DIAGNOSIS — F329 Major depressive disorder, single episode, unspecified: Secondary | ICD-10-CM | POA: Diagnosis not present

## 2015-09-24 DIAGNOSIS — M479 Spondylosis, unspecified: Secondary | ICD-10-CM | POA: Diagnosis not present

## 2015-09-24 DIAGNOSIS — I6523 Occlusion and stenosis of bilateral carotid arteries: Secondary | ICD-10-CM | POA: Insufficient documentation

## 2015-09-24 DIAGNOSIS — Z85850 Personal history of malignant neoplasm of thyroid: Secondary | ICD-10-CM | POA: Insufficient documentation

## 2015-09-24 DIAGNOSIS — I2510 Atherosclerotic heart disease of native coronary artery without angina pectoris: Secondary | ICD-10-CM | POA: Insufficient documentation

## 2015-09-24 DIAGNOSIS — E785 Hyperlipidemia, unspecified: Secondary | ICD-10-CM | POA: Insufficient documentation

## 2015-09-24 DIAGNOSIS — M542 Cervicalgia: Secondary | ICD-10-CM | POA: Insufficient documentation

## 2015-09-24 DIAGNOSIS — Z8249 Family history of ischemic heart disease and other diseases of the circulatory system: Secondary | ICD-10-CM | POA: Diagnosis not present

## 2015-09-24 HISTORY — PX: EP IMPLANTABLE DEVICE: SHX172B

## 2015-09-24 SURGERY — LOOP RECORDER INSERTION
Anesthesia: LOCAL

## 2015-09-24 MED ORDER — LIDOCAINE-EPINEPHRINE 1 %-1:100000 IJ SOLN
INTRAMUSCULAR | Status: DC | PRN
  Administered 2015-09-24: 20 mL

## 2015-09-24 MED ORDER — METOPROLOL TARTRATE 12.5 MG HALF TABLET
ORAL_TABLET | ORAL | Status: AC
  Administered 2015-09-24: 25 mg via ORAL
  Filled 2015-09-24: qty 2

## 2015-09-24 MED ORDER — METOPROLOL TARTRATE 25 MG PO TABS
25.0000 mg | ORAL_TABLET | Freq: Once | ORAL | Status: AC
  Administered 2015-09-24: 25 mg via ORAL

## 2015-09-24 MED ORDER — LIDOCAINE-EPINEPHRINE 1 %-1:100000 IJ SOLN
INTRAMUSCULAR | Status: AC
  Filled 2015-09-24: qty 1

## 2015-09-24 SURGICAL SUPPLY — 2 items
LOOP REVEAL LINQSYS (Prosthesis & Implant Heart) ×1 IMPLANT
PACK LOOP INSERTION (CUSTOM PROCEDURE TRAY) ×2 IMPLANT

## 2015-09-24 NOTE — H&P (View-Only) (Signed)
OFFICE NOTE  Chief Complaint:  Neck pain, recent TIA / cryptogenic stroke  Primary Care Physician: Howard Pouch, DO  HPI:  Colleen Wright is a 68 showed female previous he followed by Dr. Nolon Lennert, who saw her last in 2013. Prior to that she had been seen at [facility] cardiology and an angioplasty of a posterior lateral branch in 2009. Since then she's had no recurrent chest pain symptoms. In 2013 she had a repeat Myoview which was low risk. She has however recently had a TIA episode which brought her to the hospital. Workup indicated normal LV function by echo and showed mild to moderate bilateral carotid artery disease, which was not thought to be the source of her symptoms. She had a MRI which showed no acute stroke however there were signs of old lacunar infarcts and volume loss. She denies palpitations, chest pain or worsening shortness of breath. She is complaining of some pain over the posterior neck base that radiates over the shoulders. This is been going on for several weeks around the time when she had her TIA episode. She's not had any further episodes since then.  PMHx:  Past Medical History  Diagnosis Date  . Colon polyps   . Allergy   . Arthritis     low back  . Cancer [facility])     thyroid, s/p thyroidectomy 8 years ago  . Depression   . GERD (gastroesophageal reflux disease)   . Hyperlipidemia   . Hypertension   . Ulcer   . Hypothyroidism 06/12/2011  . Hiatal hernia   . CAD (coronary artery disease) 06/12/2011  . Back pain 06/13/2011  . Vertigo 06/13/2011  . Leg fracture   . Thyroid cancer ([place])   . Skin lesions, generalized 05/23/2012  . Pain of left heel 05/23/2012  . Pain in joint, ankle and foot 11/02/2012    Past Surgical History  Procedure Laterality Date  . Thyroidectomy, partial      twice  . Fracture surgery      left leg, pin and 5 bolts in place in lower tibia  . Tubal ligation      growth removed from posterior vaginal vault  . Lesion removal        vaginal  . Balloon angioplasty, artery      FAMHx:  Family History  Problem Relation Age of Onset  . Cancer Mother     ovary/uterus-can't remember  . Stroke Mother   . Heart disease Father   . Heart disease Sister     bypass  . Stroke Sister   . Cancer Sister     LN, bone  . Hypertension Sister   . Heart disease Brother     bypass  . Cancer Cousin     breast    SOCHx:   reports that she has quit smoking. She has never used smokeless tobacco. She reports that she does not drink alcohol or use illicit drugs.  ALLERGIES:  Allergies  Allergen Reactions  . Amoxicillin Other (See Comments)    Patient develops a very bad yeast infection.    ROS: A comprehensive review of systems was negative except for: Musculoskeletal: positive for neck pain Neurological: positive for Stroke/TIA  HOME MEDS: Current Outpatient Prescriptions  Medication Sig Dispense Refill  . ALPRAZolam (XANAX) 1 MG tablet Take 1 tablet (1 mg total) by mouth at bedtime as needed. 30 tablet 1  . aspirin 325 MG tablet Take 325 mg by mouth daily after supper.     Marland Kitchen  atorvastatin (LIPITOR) 40 MG tablet Take 1 tablet (40 mg total) by mouth daily at 6 PM. 30 tablet 1  . Cholecalciferol (VITAMIN D3 PO) Take 1 tablet by mouth daily after supper.    . Cholecalciferol (VITAMIN D3) 50000 UNITS CAPS Take 1 capsule by mouth every 7 (seven) days. 12 capsule 0  . Cyanocobalamin (VITAMIN B12 PO) Take 1 tablet by mouth daily after supper.    . levothyroxine (SYNTHROID, LEVOTHROID) 125 MCG tablet Take 1 tablet (125 mcg total) by mouth daily. (Patient taking differently: Take 125 mcg by mouth daily after supper. ) 90 tablet 3  . losartan-hydrochlorothiazide (HYZAAR) 50-12.5 MG tablet Take 1 tablet by mouth daily. 90 tablet 1  . nitroGLYCERIN (NITROSTAT) 0.4 MG SL tablet Place 0.4 mg under the tongue every 5 (five) minutes as needed for chest pain.    Marland Kitchen OVER THE COUNTER MEDICATION Take 1 tablet by mouth 4 (four) times a week.  Heart Healthy Vitamin     No current facility-administered medications for this visit.    LABS/IMAGING: No results found for this or any previous visit (from the past 48 hour(s)). No results found.  WEIGHTS: Wt Readings from Last 3 Encounters:  09/13/15 148 lb 9 oz (67.388 kg)  08/14/15 147 lb 4 oz (66.792 kg)  08/10/15 148 lb (67.132 kg)    VITALS: BP 158/92 mmHg  Pulse 71  Ht 5\' 2"  (1.575 m)  Wt 148 lb 9 oz (67.388 kg)  BMI 27.17 kg/m2  EXAM: General appearance: alert and no distress Neck: no carotid bruit and no JVD Lungs: clear to auscultation bilaterally Heart: regular rate and rhythm, S1, S2 normal, no murmur, click, rub or gallop Abdomen: soft, non-tender; bowel sounds normal; no masses,  no organomegaly Extremities: extremities normal, atraumatic, no cyanosis or edema Pulses: 2+ and symmetric Skin: Skin color, texture, turgor normal. No rashes or lesions Neurologic: Grossly normal Psych: Pleasant  EKG: Sinus rhythm with PVCs at 71  ASSESSMENT: 1. 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 Neck pain 3. Coronary artery disease status post remote POBA to the posterior lateral branch in 2009 4. HTN 5. HPL 6. Mild to moderate bilateral carotid artery stenosis  PLAN: 1.   Ms. Murch has had a recent TIA event. Workup in the hospital was unrevealing except she does have mild to moderate bilateral carotid artery stenosis. Echo shows normal LV function. She's complaining of some neck base pain which does not sound cardiac in etiology. Is not worse with exertion or relieved by rest. Her MRI did not show any acute stroke however remote lacunar infarcts were noted. This TIA event was associated with significant functional limitation that resolved fairly quickly. I'm concerned about the possibility of cryptogenic causes of stroke including atrial fibrillation. I discussed monitoring with her including a possible 30 day monitor however since the  episodes are very infrequent, we may have difficulty in capturing that. Another option would be an implanted loop recorder which she was much more in favor of. I did discuss the loop recorder device and risks and benefits of the procedure which is minimal and she is willing to undergo it. We'll go ahead and schedule that with Dr. Loletha Grayer my partner. We'll plan follow-up in the office in a few months and I will continue to evaluate any episodes of May, her loop recorder. She will need repeat carotid Dopplers in one year. Blood pressure is high normal today we will reevaluate that when she returns. She is on Lipitor  40 and may need additional adjustment.  Thanks for the kind referral.  Pixie Casino, MD, [facility] Attending Cardiologist [place] 09/13/2015, 6:17 PM

## 2015-09-24 NOTE — Op Note (Signed)
LOOP RECORDER IMPLANT   Procedure report  Procedure performed:  Loop recorder implantation   Reason for procedure:  1. Cryptogenic stroke Procedure performed by:  Sanda Klein, MD  Complications:  None  Estimated blood loss:  <5 mL  Medications administered during procedure:  Lidocaine 1% with 1/10,000 epinephrine 10 mL locally Device details:  Medtronic Reveal Linq model number [id number], serial number [id number] Procedure details:  After the risks and benefits of the procedure were discussed the patient provided informed consent. The patient was prepped and draped in usual sterile fashion. Local anesthesia was administered to an area 2 cm to the left of the sternum in the 4th intercostal space. A cutaneous incision was made using the incision tool. The introducer was then used to create a subcutaneous tunnel and carefully deploy the device. Local pressure was held to ensure hemostasis.  The incision was closed with SteriStrips and a sterile dressing was applied.  R waves 0.39 mV.  Sanda Klein, MD, [place] [phone] office [phone] pager 09/24/2015 2:50 PM

## 2015-09-24 NOTE — Progress Notes (Signed)
Dr. Sallyanne Kuster looked at 12 lead EKG. Instructed to keep pt a while longer to monitor. Pt's VSS.

## 2015-09-24 NOTE — Progress Notes (Signed)
Not long after loop recorder implantation she developed tachycardia, elevated blood pressure and chest pressure. The EKG shows sinus rhythm and very frequent PACs, atrial couplets even brief runs of atrial tachycardia. She did not have atrial fibrillation. A single dose of metoprolol was administered and she gradually improved and is asymptomatic at the time of discharge. Speculate that the epinephrine in the local anesthetic may have triggered the tachycardia and elevated blood pressure. It is probably wise to avoid future epinephrine and local anesthetics.

## 2015-09-24 NOTE — Interval H&P Note (Signed)
History and Physical Interval Note:  09/24/2015 2:50 PM  Colleen Wright  has presented today for surgery, with the diagnosis of cryptogenic stroke  The various methods of treatment have been discussed with the patient and family. After consideration of risks, benefits and other options for treatment, the patient has consented to  Procedure(s): Loop Recorder Insertion (N/A) as a surgical intervention .  The patient's history has been reviewed, patient examined, no change in status, stable for surgery.  I have reviewed the patient's chart and labs.  Questions were answered to the patient's satisfaction.     Kester Stimpson

## 2015-09-24 NOTE — Progress Notes (Signed)
Pt c/o SOB and that her hands and feet are numb. Informed Dr. Sallyanne Kuster, order received for po Lopressor, given per order. Will monitor.

## 2015-09-24 NOTE — Progress Notes (Signed)
Pt states feeling better. VSS. Dr. Sallyanne Kuster informed.

## 2015-09-24 NOTE — Progress Notes (Signed)
Pt c/o chest heaviness and heart racing. Dr. Sallyanne Kuster notified, order received for 12 lead EKG. Pt with minimal SOB, O2 at 2LPM via  applied.

## 2015-09-25 ENCOUNTER — Encounter (HOSPITAL_COMMUNITY): Payer: Self-pay | Admitting: Cardiovascular Disease

## 2015-09-25 ENCOUNTER — Encounter: Payer: Self-pay | Admitting: Cardiology

## 2015-09-30 ENCOUNTER — Telehealth: Payer: Self-pay | Admitting: Internal Medicine

## 2015-09-30 DIAGNOSIS — R0789 Other chest pain: Secondary | ICD-10-CM

## 2015-09-30 DIAGNOSIS — R000 Tachycardia, unspecified: Secondary | ICD-10-CM

## 2015-09-30 NOTE — Telephone Encounter (Signed)
-----   Message from Pixie Casino, MD sent at 09/27/2015  6:12 PM EST ----- Regarding: Needs a stress test She had a strange reaction after her loop recorder. Dr. Loletha Grayer was concerned about ischemia. Can you order a lexiscan stress test for her (chest pressure, tachycardia) and follow-up with me afterwards?  Thanks.  Dr. Lemmie Evens  ----- Message -----    From: Sanda Klein, MD    Sent: 09/24/2015   5:08 PM      To: Pixie Casino, MD  After the loop recorder she felt chest pressure. EKG showed sinus tachycardia and lots of PACs. She was hypertensive. I wonder if it was the epinephrine mixed in with the lidocaine. Gave her 1 dose of metoprolol and she gradually got better. Not sure whether it's worth taking another look for ischemic heart disease in case this was the equivalent of a "failed stress test"

## 2015-09-30 NOTE — Telephone Encounter (Signed)
Thanks .Marland Kitchen All we can do is advise.  Dr. Debara Pickett

## 2015-09-30 NOTE — Telephone Encounter (Signed)
Contacted patient regarding MD concerns & possible stress test. Patient is hesitant to have stress test stating she & her husband have both had false negative stress tests in the past and ended up with blockages. Explained this is the best non-invasive screening we have to determine possible coronary disease without doing a cardiac cath. Patient states she wants to think about this and will call back at end of the week.   Routed to MD as Juluis Rainier

## 2015-10-04 ENCOUNTER — Ambulatory Visit (INDEPENDENT_AMBULATORY_CARE_PROVIDER_SITE_OTHER): Payer: Medicare Other | Admitting: *Deleted

## 2015-10-04 DIAGNOSIS — Z95818 Presence of other cardiac implants and grafts: Secondary | ICD-10-CM

## 2015-10-04 DIAGNOSIS — I639 Cerebral infarction, unspecified: Secondary | ICD-10-CM

## 2015-10-04 LAB — CUP PACEART INCLINIC DEVICE CHECK: MDC IDC SESS DTM: 20170217213912

## 2015-10-06 NOTE — Progress Notes (Signed)
Loop recorder wound check appointment. Steri-strips previously removed by patient. Wound without redness or edema. Incision edges approximated, wound well healed. Battery status: good. R-waves 0.88mV. 1 symptom episode--patient tested symptom activator. No tachy, brady, pause, or AF episodes. Monthly summary reports and ROV in 3 months with implanting physician.

## 2015-10-07 NOTE — Telephone Encounter (Signed)
Called patient regarding stress test. She states she would like to go ahead and proceed with testing. Informed her a scheduler will contact her about arranging an appt date and time (she prefers Friday afternoons)  [place] ordered. Staff message sent to schedulers to arrange   Patient also states she has a referral to pulmonary and wants to cancel. Advised her to contact PCP about reason for referral for cancelling appt.

## 2015-10-11 ENCOUNTER — Institutional Professional Consult (permissible substitution): Payer: No Typology Code available for payment source | Admitting: Pulmonary Disease

## 2015-10-14 ENCOUNTER — Other Ambulatory Visit: Payer: Self-pay | Admitting: Family Medicine

## 2015-10-16 ENCOUNTER — Encounter: Payer: Self-pay | Admitting: Gastroenterology

## 2015-10-17 DIAGNOSIS — L814 Other melanin hyperpigmentation: Secondary | ICD-10-CM | POA: Diagnosis not present

## 2015-10-17 DIAGNOSIS — Z85828 Personal history of other malignant neoplasm of skin: Secondary | ICD-10-CM | POA: Diagnosis not present

## 2015-10-17 DIAGNOSIS — L853 Xerosis cutis: Secondary | ICD-10-CM | POA: Diagnosis not present

## 2015-10-17 DIAGNOSIS — L570 Actinic keratosis: Secondary | ICD-10-CM | POA: Diagnosis not present

## 2015-10-17 DIAGNOSIS — L821 Other seborrheic keratosis: Secondary | ICD-10-CM | POA: Diagnosis not present

## 2015-10-24 ENCOUNTER — Ambulatory Visit (INDEPENDENT_AMBULATORY_CARE_PROVIDER_SITE_OTHER): Payer: Medicare Other | Admitting: *Deleted

## 2015-10-24 DIAGNOSIS — I639 Cerebral infarction, unspecified: Secondary | ICD-10-CM

## 2015-10-28 NOTE — Progress Notes (Signed)
Carelink Summary Report / Loop Recorder 

## 2015-11-04 ENCOUNTER — Telehealth: Payer: Self-pay | Admitting: Internal Medicine

## 2015-11-04 ENCOUNTER — Encounter: Payer: Self-pay | Admitting: Internal Medicine

## 2015-11-04 NOTE — Telephone Encounter (Signed)
Pixie Casino, MD at 11/04/2015 12:01 PM     Status: Signed       Expand All Collapse All   Notified by Dr. Loletha Grayer that her loop recorder indicated an irregularly irregular tachycardia with rates in the 150's, highly suggestive of atrial fibrillation. Would recommend starting anticoagulation for this. Her CHADSVASC score is 5 (given recent TIA) - start Eliquis 5 mg BID and reduce (but continue) aspirin to 81 mg daily (for history of CAD and prior angioplasty).  Pixie Casino, MD, [facility] Attending Cardiologist [place]

## 2015-11-04 NOTE — Progress Notes (Signed)
Notified by Dr. Loletha Grayer that her loop recorder indicated an irregularly irregular tachycardia with rates in the 150's, highly suggestive of atrial fibrillation. Would recommend starting anticoagulation for this. Her CHADSVASC score is 5 (given recent TIA) - start Eliquis 5 mg BID and reduce (but continue) aspirin to 81 mg daily (for history of CAD and prior angioplasty).  Pixie Casino, MD, [facility] Attending Cardiologist [place]

## 2015-11-12 NOTE — Telephone Encounter (Signed)
I returned patient call.  Explained loop recorder events, Dr. Lysbeth Penner recommendations. Pt voiced understanding, but states she is very hesitant about starting a new medication. She would like a call from Dr. Debara Pickett (preferred) or his nurse to discuss the rationale for med changes. Informed her I would be happy to answer any questions, but pt preferred to speak w/ physician.

## 2015-11-12 NOTE — Telephone Encounter (Signed)
/  u  Pt returning RN phon ecall from 3/20. Please call back and discuss.

## 2015-11-13 MED ORDER — APIXABAN 5 MG PO TABS: 5.0000 mg | ORAL_TABLET | Freq: Two times a day (BID) | ORAL | Status: DC

## 2015-11-13 NOTE — Telephone Encounter (Signed)
Called Colleen Wright - she is agreeable to start Eliquis 5 mg BID for a-fib. Reduce aspirin to 81 mg daily - history of CAD. Can stop her 325 mg aspirin. Please send her a co-pay card for free 1 month.  Dr. Lemmie Evens

## 2015-11-13 NOTE — Telephone Encounter (Signed)
Med list updated.  Rx(s) sent to pharmacy electronically.

## 2015-11-14 NOTE — Telephone Encounter (Signed)
LMTCB

## 2015-11-15 ENCOUNTER — Telehealth: Payer: Self-pay

## 2015-11-15 NOTE — Telephone Encounter (Signed)
Prior auth for Eliquis 5 mg submitted to [employer] via Fax.

## 2015-11-18 ENCOUNTER — Telehealth: Payer: Self-pay

## 2015-11-18 NOTE — Telephone Encounter (Signed)
Eliquis approved by [employer], an d is good through 11/15/2018.

## 2015-11-19 ENCOUNTER — Other Ambulatory Visit: Payer: Self-pay | Admitting: Family Medicine

## 2015-11-20 ENCOUNTER — Other Ambulatory Visit: Payer: Self-pay | Admitting: Family Medicine

## 2015-11-20 MED ORDER — APIXABAN 5 MG PO TABS: 5.0000 mg | ORAL_TABLET | Freq: Two times a day (BID) | ORAL | Status: DC

## 2015-11-20 MED ORDER — ALPRAZOLAM 1 MG PO TABS
1.0000 mg | ORAL_TABLET | Freq: Every evening | ORAL | Status: DC | PRN
Start: 2015-11-20 — End: 2016-05-06

## 2015-11-20 NOTE — Telephone Encounter (Signed)
Called patient. She states she called and checked on Rx for eliquis and it is $200/month. Explained this could be due a prescription drug deductible that needs to be met. She states she cannot pay this price. Explained that warfarin is the only generic alternative and that it involves frequent blood monitoring.   Will mail free 30 day card and eliquis patient assistance paperwork to patient to see if she qualifies. She states she may not.   Did not recommend change from asa 325 to 87 at this time as patient has yet to start on NOAC

## 2015-11-21 NOTE — Telephone Encounter (Signed)
Ok .. We can also try xarelto 20 mg daily if the cost of Eliquis is too much. Otherwise, will need to arrange warfarin with Erasmo Downer.  Dr. Lemmie Evens

## 2015-11-25 ENCOUNTER — Ambulatory Visit (INDEPENDENT_AMBULATORY_CARE_PROVIDER_SITE_OTHER): Payer: Medicare Other | Admitting: *Deleted

## 2015-11-25 DIAGNOSIS — I639 Cerebral infarction, unspecified: Secondary | ICD-10-CM | POA: Diagnosis not present

## 2015-11-25 NOTE — Telephone Encounter (Signed)
eliquis patient assistance and printed RN signed and mailed to patient with co-pay card.

## 2015-11-25 NOTE — Telephone Encounter (Signed)
Called patient to let her know that eliquis patient assistance and free 30 day card. Her insurance company told her that they will work on lowering the cost of Eliquis but also told her that xarelto would be cheaper. Offered to Rx xarelto 20mg  QD per MD but patient would prefer to try eliquis first. She will reduce asa from 325mg  daily to 81mg  daily when she starts on eliquis with free 30 day card. Asked that patient keep [place] updated if she has any further info on the price of her medications/coverage.

## 2015-11-25 NOTE — Progress Notes (Signed)
Carelink Summary Report / Loop Recorder 

## 2015-11-28 ENCOUNTER — Telehealth: Payer: Self-pay | Admitting: Internal Medicine

## 2015-11-28 NOTE — Telephone Encounter (Signed)
Follow up   Mendel Ryder with [facility] [employer] called request prior Auth for Eliquis. Please call back to discuss

## 2015-11-28 NOTE — Telephone Encounter (Signed)
Message routed to [place] for [place]

## 2015-11-29 NOTE — Telephone Encounter (Signed)
Spoke with patient.  She states she called her Insurance Co. They told her we could ask for  A Tier exception for her Eliquis.  I called [employer], and a Tier Exception was granted, down to a Tier 1. I will notify the pharmacy when I get confirmation fax.

## 2015-11-29 NOTE — Telephone Encounter (Signed)
Patient calling to speak with Dr. Debara Pickett or his nurse about papers that were faxed to Dr Debara Pickett about recent medication cost.  She did not tell the name of the rx but would like a call back.

## 2015-11-29 NOTE — Telephone Encounter (Signed)
Thanks,  DR. H

## 2015-12-12 ENCOUNTER — Ambulatory Visit (INDEPENDENT_AMBULATORY_CARE_PROVIDER_SITE_OTHER): Payer: Medicare Other | Admitting: Gastroenterology

## 2015-12-12 ENCOUNTER — Encounter: Payer: Self-pay | Admitting: Internal Medicine

## 2015-12-12 ENCOUNTER — Encounter: Payer: Self-pay | Admitting: Family Medicine

## 2015-12-12 ENCOUNTER — Encounter: Payer: Self-pay | Admitting: Gastroenterology

## 2015-12-12 ENCOUNTER — Telehealth: Payer: Self-pay

## 2015-12-12 VITALS — BP 110/60 | HR 82 | Ht 62.0 in | Wt 149.5 lb

## 2015-12-12 DIAGNOSIS — Z7901 Long term (current) use of anticoagulants: Secondary | ICD-10-CM | POA: Diagnosis not present

## 2015-12-12 DIAGNOSIS — I639 Cerebral infarction, unspecified: Secondary | ICD-10-CM | POA: Diagnosis not present

## 2015-12-12 DIAGNOSIS — Z860100 Personal history of colon polyps, unspecified: Secondary | ICD-10-CM | POA: Insufficient documentation

## 2015-12-12 NOTE — Progress Notes (Signed)
    History of Present Illness: This is a 75 year old female referred by Raoul Pitch, Renee A, DO for the evaluation of adenomatous colon polyps. She was started on Eliquis for a history of TIAs and loop recorder was placed in 09/2015. Colonoscopy in 11/2009 showed 1 adenomatous colon polyp. EGD in 09/2007 showed mild chronic gastritis and a hyperplastic gastric polyp. Denies weight loss, abdominal pain, constipation, diarrhea, change in stool caliber, melena, hematochezia, nausea, vomiting, dysphagia, reflux symptoms, chest pain.  Review of Systems: Pertinent positive and negative review of systems were noted in the above HPI section. All other review of systems were otherwise negative.  Current Medications, Allergies, Past Medical History, Past Surgical History, Family History and Social History were reviewed in [employer] record.  Physical Exam: General: Well developed, well nourished, no acute distress Head: Normocephalic and atraumatic Eyes:  sclerae anicteric, EOMI Ears: Normal auditory acuity Mouth: No deformity or lesions Neck: Supple, no masses or thyromegaly Lungs: Clear throughout to auscultation Heart: Regular rate and rhythm; no murmurs, rubs or bruits Abdomen: Soft, non tender and non distended. No masses, hepatosplenomegaly or hernias noted. Normal Bowel sounds Rectal: deferred to colonoscopy Musculoskeletal: Symmetrical with no gross deformities  Skin: No lesions on visible extremities Pulses:  Normal pulses noted Extremities: No clubbing, cyanosis, edema or deformities noted Neurological: Alert oriented x 4, grossly nonfocal Cervical Nodes:  No significant cervical adenopathy Inguinal Nodes: No significant inguinal adenopathy Psychological:  Alert and cooperative. Normal mood and affect  Assessment and Recommendations:  1. Personal history of adenomatous colon polyps due for 5 year interval surveillance colonoscopy. The risks (including bleeding,  perforation, infection, missed lesions, medication reactions and possible hospitalization or surgery if complications occur), benefits, and alternatives to colonoscopy with possible biopsy and possible polypectomy were discussed with the patient and they consent to proceed.   2. TIA, loop recorder, Eliquis recently started. Hold Eliquis 2 days before procedure - will instruct when and how to resume after procedure. Low but real risk of cardiovascular event such as heart attack, stroke, embolism, thrombosis or ischemia/infarct of other organs off Eliquis explained and need to seek urgent help if this occurs. The patient consents to proceed. Will communicate by phone or EMR with patient's prescribing provider to confirm that holding Eliquis is reasonable in this case.    cc: Ma Hillock, DO [id number], [place] [id number]

## 2015-12-12 NOTE — Patient Instructions (Signed)
We will contact you after we hear from Dr. Debara Pickett regarding your Eliquis clearance and scheduling your Colonoscopy.  Thank you for choosing me and [facility].  Pricilla Riffle. Dagoberto Ligas., MD., Marval Regal

## 2015-12-12 NOTE — Telephone Encounter (Signed)
  12/12/2015   RE: Colleen Wright DOB: January 25, 1941 MRN: [id number]   Dear Dr. Debara Pickett,    The above patient needs to be scheduled for an endoscopic procedure. Our records show that she is on anticoagulation therapy.   Please advise as to how long or if the patient may come off her therapy of Eliquis prior to the procedure. We will schedule her Colonoscopy depending on your answer.   Please route your answer back to Marlon Pel, CMA.  Sincerely,    Marlon Pel, CMA

## 2015-12-12 NOTE — Telephone Encounter (Signed)
Clearance letter sent - ok to stop Eliquis for 2 days prior to procedure.  Dr. Lemmie Evens

## 2015-12-13 NOTE — Telephone Encounter (Signed)
Left a message for patient to return my call. 

## 2015-12-16 NOTE — Telephone Encounter (Signed)
Spoke with patient and informed her per Dr. Debara Pickett patient can come off Eliquis 2 days prior to procedure. Patient verbalized understanding. Patient scheduled for colon on 02/25/16 at 9:30am. Patient is going to call me back after she asks her sister when she wants to come for her Colonoscopy. We will then set patient up for pre-visit.

## 2015-12-16 NOTE — Telephone Encounter (Signed)
Patient scheduled pre-visit on 02/14/16 at 4:00pm and Colonoscopy 02/25/16 at 9:30am. Patient verbalized understanding of the hold for Eliquis also.

## 2015-12-23 ENCOUNTER — Ambulatory Visit (INDEPENDENT_AMBULATORY_CARE_PROVIDER_SITE_OTHER): Payer: Medicare Other | Admitting: *Deleted

## 2015-12-23 DIAGNOSIS — I639 Cerebral infarction, unspecified: Secondary | ICD-10-CM

## 2015-12-24 NOTE — Progress Notes (Signed)
Carelink Summary Report / Loop Recorder 

## 2015-12-28 ENCOUNTER — Telehealth: Payer: Self-pay | Admitting: Cardiology

## 2015-12-28 ENCOUNTER — Encounter (HOSPITAL_COMMUNITY): Payer: Self-pay | Admitting: Emergency Medicine

## 2015-12-28 ENCOUNTER — Emergency Department (HOSPITAL_COMMUNITY): Payer: Medicare Other

## 2015-12-28 ENCOUNTER — Emergency Department (HOSPITAL_COMMUNITY)
Admission: EM | Admit: 2015-12-28 | Discharge: 2015-12-28 | Disposition: A | Discharge status: Home / Self Care | Payer: Medicare Other | Attending: Emergency Medicine | Admitting: Emergency Medicine

## 2015-12-28 DIAGNOSIS — I10 Essential (primary) hypertension: Secondary | ICD-10-CM | POA: Diagnosis not present

## 2015-12-28 DIAGNOSIS — R079 Chest pain, unspecified: Secondary | ICD-10-CM | POA: Diagnosis not present

## 2015-12-28 DIAGNOSIS — Z79899 Other long term (current) drug therapy: Secondary | ICD-10-CM | POA: Diagnosis not present

## 2015-12-28 DIAGNOSIS — E039 Hypothyroidism, unspecified: Secondary | ICD-10-CM | POA: Insufficient documentation

## 2015-12-28 DIAGNOSIS — I2510 Atherosclerotic heart disease of native coronary artery without angina pectoris: Secondary | ICD-10-CM | POA: Insufficient documentation

## 2015-12-28 DIAGNOSIS — Z86010 Personal history of colonic polyps: Secondary | ICD-10-CM | POA: Insufficient documentation

## 2015-12-28 DIAGNOSIS — F329 Major depressive disorder, single episode, unspecified: Secondary | ICD-10-CM | POA: Insufficient documentation

## 2015-12-28 DIAGNOSIS — R0789 Other chest pain: Secondary | ICD-10-CM | POA: Diagnosis not present

## 2015-12-28 DIAGNOSIS — Z8719 Personal history of other diseases of the digestive system: Secondary | ICD-10-CM | POA: Diagnosis not present

## 2015-12-28 DIAGNOSIS — M1990 Unspecified osteoarthritis, unspecified site: Secondary | ICD-10-CM | POA: Diagnosis not present

## 2015-12-28 DIAGNOSIS — Z872 Personal history of diseases of the skin and subcutaneous tissue: Secondary | ICD-10-CM | POA: Diagnosis not present

## 2015-12-28 DIAGNOSIS — Z7982 Long term (current) use of aspirin: Secondary | ICD-10-CM | POA: Diagnosis not present

## 2015-12-28 DIAGNOSIS — Z8781 Personal history of (healed) traumatic fracture: Secondary | ICD-10-CM | POA: Diagnosis not present

## 2015-12-28 DIAGNOSIS — R112 Nausea with vomiting, unspecified: Secondary | ICD-10-CM

## 2015-12-28 DIAGNOSIS — Z85850 Personal history of malignant neoplasm of thyroid: Secondary | ICD-10-CM | POA: Insufficient documentation

## 2015-12-28 DIAGNOSIS — Z880 Allergy status to penicillin: Secondary | ICD-10-CM | POA: Diagnosis not present

## 2015-12-28 DIAGNOSIS — E785 Hyperlipidemia, unspecified: Secondary | ICD-10-CM | POA: Diagnosis not present

## 2015-12-28 LAB — HEPATIC FUNCTION PANEL
ALK PHOS: 82 U/L (ref 38–126)
ALT: 15 U/L (ref 14–54)
AST: 21 U/L (ref 15–41)
Albumin: 3.5 g/dL (ref 3.5–5.0)
Bilirubin, Direct: <0.1 mg/dL — ABNORMAL LOW (ref 0.1–0.5)
Total Bilirubin: 0.6 mg/dL (ref 0.3–1.2)
Total Protein: 7 g/dL (ref 6.5–8.1)

## 2015-12-28 LAB — BASIC METABOLIC PANEL
ANION GAP: 13 (ref 5–15)
BUN: 29 mg/dL — ABNORMAL HIGH (ref 6–20)
CHLORIDE: 103 mmol/L (ref 101–111)
CO2: 21 mmol/L — ABNORMAL LOW (ref 22–32)
Calcium: 9.1 mg/dL (ref 8.9–10.3)
Creatinine, Ser: 1.2 mg/dL — ABNORMAL HIGH (ref 0.44–1.00)
GFR calc Af Amer: 50 mL/min — ABNORMAL LOW (ref >60)
GFR, EST NON AFRICAN AMERICAN: 43 mL/min — AB (ref >60)
Glucose, Bld: 106 mg/dL — ABNORMAL HIGH (ref 65–99)
POTASSIUM: 3.2 mmol/L — AB (ref 3.5–5.1)
SODIUM: 137 mmol/L (ref 135–145)

## 2015-12-28 LAB — TROPONIN I: TROPONIN I: 0.03 ng/mL (ref <0.031)

## 2015-12-28 LAB — PROTIME-INR
INR: 1.22 (ref 0.00–1.49)
PROTHROMBIN TIME: 15.6 s — AB (ref 11.6–15.2)

## 2015-12-28 LAB — CBC
HEMATOCRIT: 34.3 % — AB (ref 36.0–46.0)
HEMOGLOBIN: 11.8 g/dL — AB (ref 12.0–15.0)
MCH: 29.8 pg (ref 26.0–34.0)
MCHC: 34.4 g/dL (ref 30.0–36.0)
MCV: 86.6 fL (ref 78.0–100.0)
Platelets: 206 10*3/uL (ref 150–400)
RBC: 3.96 MIL/uL (ref 3.87–5.11)
RDW: 12.2 % (ref 11.5–15.5)
WBC: 12.8 10*3/uL — AB (ref 4.0–10.5)

## 2015-12-28 LAB — I-STAT TROPONIN, ED: Troponin i, poc: 0.01 ng/mL (ref 0.00–0.08)

## 2015-12-28 LAB — LIPASE, BLOOD: LIPASE: 37 U/L (ref 11–51)

## 2015-12-28 MED ORDER — ONDANSETRON 4 MG PO TBDP: 4.0000 mg | ORAL_TABLET | Freq: Three times a day (TID) | ORAL | Status: DC | PRN

## 2015-12-28 MED ORDER — ONDANSETRON HCL 4 MG/2ML IJ SOLN
4.0000 mg | Freq: Once | INTRAMUSCULAR | Status: AC
  Administered 2015-12-28: 4 mg via INTRAVENOUS
  Filled 2015-12-28: qty 2

## 2015-12-28 MED ORDER — SODIUM CHLORIDE 0.9 % IV BOLUS (SEPSIS)
500.0000 mL | Freq: Once | INTRAVENOUS | Status: AC
  Administered 2015-12-28: 500 mL via INTRAVENOUS

## 2015-12-28 NOTE — ED Provider Notes (Signed)
CSN: [id number]     Arrival date & time 12/28/15  1859 History   First MD Initiated Contact with Patient 12/28/15 1915     Chief Complaint  Patient presents with  . Chest Pain     (Consider location/radiation/quality/duration/timing/severity/associated sxs/prior Treatment) HPI Patient presents with concern of chest tightness, nausea, vomiting, weakness. Patient has history of CAD, one prior cardiac event. She notes that recently she has had mild ongoing weakness. However, today, after going shopping, she felt particularly weak, had an episode of heaviness, tightness in her anterior chest, and subsequently had multiple episodes of vomiting. Currently, her symptoms have improved substantially, she has no ongoing chest tightness. Patient takes Eliquis, and one month ago had a cardiac monitor placed. Today the patient also took aspirin after symptoms became uncomfortable.   Past Medical History  Diagnosis Date  . Adenomatous colon polyp 2006  . Allergy   . Arthritis     low back  . Cancer [facility])     thyroid, s/p thyroidectomy 8 years ago  . Depression   . GERD (gastroesophageal reflux disease)   . Hyperlipidemia   . Hypertension   . Ulcer   . Hypothyroidism 06/12/2011  . Hiatal hernia   . CAD (coronary artery disease) 06/12/2011  . Back pain 06/13/2011  . Vertigo 06/13/2011  . Leg fracture   . Thyroid cancer ([place])   . Skin lesions, generalized 05/23/2012  . Pain of left heel 05/23/2012  . Pain in joint, ankle and foot 11/02/2012   Past Surgical History  Procedure Laterality Date  . Thyroidectomy, partial      twice  . Fracture surgery      left leg, pin and 5 bolts in place in lower tibia  . Tubal ligation      growth removed from posterior vaginal vault  . Lesion removal      vaginal  . Balloon angioplasty, artery    . Ep implantable device N/A 09/24/2015    Procedure: Loop Recorder Insertion;  Surgeon: Sanda Klein, MD;  Location: [place] [facility];  Service:  Cardiovascular;  Laterality: N/A;   Family History  Problem Relation Age of Onset  . Cancer Mother     ovary/uterus-can't remember  . Stroke Mother   . Heart disease Father   . Heart disease Sister     bypass  . Stroke Sister   . Cancer Sister     LN, bone  . Hypertension Sister   . Heart disease Brother     bypass  . Cancer Cousin     breast   Social History  Substance Use Topics  . Smoking status: Former Research scientist (life sciences)  . Smokeless tobacco: Never Used  . Alcohol Use: No   OB History    No data available     Review of Systems  Constitutional:       Per HPI, otherwise negative  HENT:       Per HPI, otherwise negative  Respiratory:       Per HPI, otherwise negative  Cardiovascular:       Per HPI, otherwise negative  Gastrointestinal: Positive for nausea and vomiting.  Endocrine:       Negative aside from HPI  Genitourinary:       Neg aside from HPI   Musculoskeletal:       Per HPI, otherwise negative  Skin: Negative.   Neurological: Negative for syncope.      Allergies  Amoxicillin  Home Medications   Prior to Admission  medications   Medication Sig Start Date End Date Taking? Authorizing Provider  ALPRAZolam Duanne Moron) 1 MG tablet Take 1 tablet (1 mg total) by mouth at bedtime as needed. Patient taking differently: Take 0.5-1 mg by mouth at bedtime.  11/20/15  Yes Renee A Kuneff, DO  apixaban (ELIQUIS) 5 MG TABS tablet Take 1 tablet (5 mg total) by mouth 2 (two) times daily. 11/20/15  Yes Pixie Casino, MD  aspirin 325 MG EC tablet Take 325 mg by mouth daily.   Yes Historical Provider, MD  atorvastatin (LIPITOR) 40 MG tablet TAKE 1 TABLET BY MOUTH DAILY AT 6 PM 10/14/15  Yes Renee A Kuneff, DO  Cholecalciferol (VITAMIN D3 PO) Take 1 tablet by mouth daily after supper.   Yes Historical Provider, MD  Cyanocobalamin (VITAMIN B12 PO) Take 1 tablet by mouth daily after supper.   Yes Historical Provider, MD  levothyroxine (SYNTHROID, LEVOTHROID) 125 MCG tablet Take 1  tablet (125 mcg total) by mouth daily. Patient taking differently: Take 125 mcg by mouth daily after supper.  06/24/15  Yes Renee A Kuneff, DO  losartan-hydrochlorothiazide (HYZAAR) 50-12.5 MG tablet Take 1 tablet by mouth daily. 09/11/15  Yes Renee A Kuneff, DO  nitroGLYCERIN (NITROSTAT) 0.4 MG SL tablet Place 0.4 mg under the tongue every 5 (five) minutes as needed for chest pain.   Yes Historical Provider, MD  ondansetron (ZOFRAN ODT) 4 MG disintegrating tablet Take 1 tablet (4 mg total) by mouth every 8 (eight) hours as needed for nausea or vomiting. 12/28/15   Carmin Muskrat, MD   BP 157/77 mmHg  Pulse 71  Temp(Src) 98.1 F (36.7 C) (Oral)  Resp 16  SpO2 100% Physical Exam  Constitutional: She is oriented to person, place, and time. She appears well-developed and well-nourished. No distress.  HENT:  Head: Normocephalic and atraumatic.  Eyes: Conjunctivae and EOM are normal.  Cardiovascular: Normal rate and regular rhythm.   Pulmonary/Chest: Effort normal and breath sounds normal. No stridor. No respiratory distress.  Abdominal: She exhibits no distension. There is no tenderness.  Musculoskeletal: She exhibits no edema.  Neurological: She is alert and oriented to person, place, and time. No cranial nerve deficit.  Skin: Skin is warm and dry.  Psychiatric: She has a normal mood and affect.  Nursing note and vitals reviewed.   ED Course  Procedures (including critical care time) Labs Review Labs Reviewed  BASIC METABOLIC PANEL - Abnormal; Notable for the following:    Potassium 3.2 (*)    CO2 21 (*)    Glucose, Bld 106 (*)    BUN 29 (*)    Creatinine, Ser 1.20 (*)    GFR calc non Af Amer 43 (*)    GFR calc Af Amer 50 (*)    All other components within normal limits  CBC - Abnormal; Notable for the following:    WBC 12.8 (*)    Hemoglobin 11.8 (*)    HCT 34.3 (*)    All other components within normal limits  PROTIME-INR - Abnormal; Notable for the following:     Prothrombin Time 15.6 (*)    All other components within normal limits  HEPATIC FUNCTION PANEL - Abnormal; Notable for the following:    Bilirubin, Direct <0.1 (*)    All other components within normal limits  LIPASE, BLOOD  TROPONIN I  I-STAT TROPOININ, ED    EMR notable for placement of cardiac loop recorder, initiation of eliquis.  Imaging Review Dg Chest 2 View  12/28/2015  CLINICAL DATA:  Chest pain EXAM: CHEST  2 VIEW COMPARISON: COPD. FINDINGS: Heart size normal. Interval placement of cardiac loop recorder. Negative for heart failure. Lungs are clear without infiltrate infiltrate or effusion Pulmonary hyperinflation.  COPD with apical scarring IMPRESSION: COPD.  No acute abnormality. Electronically Signed   By: Franchot Gallo M.D.   On: 12/28/2015 19:35   I have personally reviewed and evaluated these images and lab results as part of my medical decision-making.   EKG Interpretation   Date/Time:  Saturday Dec 28 2015 19:03:58 EDT Ventricular Rate:  83 PR Interval:  123 QRS Duration: 89 QT Interval:  397 QTC Calculation: 466 R Axis:   50 Text Interpretation:  Sinus rhythm Normal ECG Confirmed by Carmin Muskrat  MD ([id number]) on 12/28/2015 9:18:12 PM     10:44 PM Following return of a second troponin I reevaluated the patient. We discussed all findings at length. Patient continues to have no complaints Granddaughter states that the patient has had similar episodes of chest pain for months. With no ongoing concerns, the patient will follow up with her cardiologist next week.  MDM   Final diagnoses:  Non-intractable vomiting with nausea, vomiting of unspecified type  Atypical chest pain   Patient presents after episodes of nausea, vomiting, chest tightness. Here the patient is awake and alert, hemodynamically stable. Patient has to normal troponin, unremarkable EKG.  Patient is on L [place], and though she does have a history of coronary disease, there is no evidence  for ongoing coronary ischemia. Patient is appropriately medically managed. Patient has no evidence for new stroke, PE, pneumonia or other infectious pathology. With resolution of symptoms, reassuring vitals, labs, she will follow up with her primary care, cardiology team.   Carmin Muskrat, MD 12/28/15 2245

## 2015-12-28 NOTE — Discharge Instructions (Signed)
As discussed, your evaluation today has been largely reassuring.  But, it is important that you monitor your condition carefully, and do not hesitate to return to the ED if you develop new, or concerning changes in your condition. ? ?Otherwise, please follow-up with your physician for appropriate ongoing care. ? ?

## 2015-12-28 NOTE — ED Notes (Signed)
Pt up to restroom. Ambulated independently

## 2015-12-28 NOTE — Telephone Encounter (Signed)
Patient's grandedaughter called stating that patient went to the grocery store and when she got home she started  vomiting and complaining of CP, tingling all over and BP is very high.  Instructed to go to [facility] ER now.  Granddaughter will call EMS.

## 2015-12-28 NOTE — ED Notes (Signed)
MD at bedside. 

## 2015-12-28 NOTE — ED Notes (Signed)
Pt ambulates independently and with steady gait at time of discharge. Discharge instructions and follow up information reviewed with patient. No other questions or concerns voiced at this time.  

## 2015-12-28 NOTE — ED Notes (Signed)
Per EMS:  Pt called out today after experiencing an episode of heaviness/pressure/tightness in her chest (which pt states she has had intermittently x 2-3 months).  Pt went shopping after the episode and then came home and began to have nausea, vomited twice, and began to feel weak.  Denies LOC.  Pt took ASA at home.  Pt began Eliquis x 1 month ago, and had an "internal cardiac monitor" placed in her chest approx 3 months ago.

## 2016-01-07 ENCOUNTER — Encounter: Payer: Self-pay | Admitting: Cardiology

## 2016-01-07 ENCOUNTER — Ambulatory Visit (INDEPENDENT_AMBULATORY_CARE_PROVIDER_SITE_OTHER): Payer: Medicare Other | Admitting: Cardiovascular Disease

## 2016-01-07 ENCOUNTER — Encounter: Payer: Self-pay | Admitting: Cardiovascular Disease

## 2016-01-07 VITALS — BP 122/70 | HR 67 | Ht 62.0 in | Wt 151.0 lb

## 2016-01-07 DIAGNOSIS — R5383 Other fatigue: Secondary | ICD-10-CM | POA: Diagnosis not present

## 2016-01-07 DIAGNOSIS — I25119 Atherosclerotic heart disease of native coronary artery with unspecified angina pectoris: Secondary | ICD-10-CM | POA: Diagnosis not present

## 2016-01-07 DIAGNOSIS — R0789 Other chest pain: Secondary | ICD-10-CM | POA: Diagnosis not present

## 2016-01-07 DIAGNOSIS — Z01812 Encounter for preprocedural laboratory examination: Secondary | ICD-10-CM

## 2016-01-07 DIAGNOSIS — I10 Essential (primary) hypertension: Secondary | ICD-10-CM

## 2016-01-07 DIAGNOSIS — I480 Paroxysmal atrial fibrillation: Secondary | ICD-10-CM

## 2016-01-07 DIAGNOSIS — I639 Cerebral infarction, unspecified: Secondary | ICD-10-CM | POA: Diagnosis not present

## 2016-01-07 DIAGNOSIS — Z4509 Encounter for adjustment and management of other cardiac device: Secondary | ICD-10-CM

## 2016-01-07 LAB — CUP PACEART INCLINIC DEVICE CHECK: MDC IDC SESS DTM: 20170523133500

## 2016-01-07 LAB — CUP PACEART REMOTE DEVICE CHECK: Date Time Interrogation Session: 20170309190636

## 2016-01-07 NOTE — Patient Instructions (Addendum)
Your physician has requested that you have a cardiac catheterization with Dr Martinique. Cardiac catheterization is used to diagnose and/or treat various heart conditions. Doctors may recommend this procedure for a number of different reasons. The most common reason is to evaluate chest pain. Chest pain can be a symptom of coronary artery disease (CAD), and cardiac catheterization can show whether plaque is narrowing or blocking your heart's arteries. This procedure is also used to evaluate the valves, as well as measure the blood flow and oxygen levels in different parts of your heart. For further information please visit [email or web address].   Following your catheterization, you will not be allowed to drive for 3 days.  No lifting, pushing, or pulling greater that 10 pounds is allowed for 1 week.  You will be required to have the following tests prior to the procedure:  1. Blood work-the blood work can be done no more than 7 days prior to the procedure. It can be done at any [facility] lab. There is one downstairs on the first floor of this building and one in the [facility] building [phone]).  Remote monitoring is used to monitor your Pacemaker of ICD from home. This monitoring reduces the number of office visits required to check your device to one time per year. It allows [place] to keep an eye on the functioning of your device to ensure it is working properly. You are scheduled for a device check from home on Monday, June 26th, 2017. You may send your transmission at any time that day. If you have a wireless device, the transmission will be sent automatically. After your physician reviews your transmission, you will receive a postcard with your next transmission date.  Dr Sallyanne Kuster recommends that you schedule a follow-up appointment in 12 months with a pacemaker check. You will receive a reminder letter in the mail two months in advance. If you don't receive a letter,  please call our office to schedule the follow-up appointment.  If you need a refill on your cardiac medications before your next appointment, please call your pharmacy.

## 2016-01-07 NOTE — Progress Notes (Signed)
Patient ID: Colleen Wright, female   DOB: 03/14/1941, 75 y.o.   MRN: [id number]    Cardiology Office Note    Date:  01/08/2016   ID:  Colleen Wright, DOB 08/24/1940, MRN [id number]  PCP:  Howard Pouch, DO  Cardiologist:   Sanda Klein, MD   No chief complaint on file.   History of Present Illness:  Colleen Wright is a 75 y.o. female here to discuss the findings of paroxysmal atrial fibrillation on her implantable loop recorder. This became apparent not long after device implantation for an episode of cryptogenic transient ischemic attack. She is now taking anticoagulation with Eliquis and has not had any bleeding problems. She has not consistently been aware of palpitations during atrial fibrillation. She has activated the loop recorder at a time when her rhythm was normal as well as when she had one episode of atrial fibrillation. She was unaware of arrhythmia during another episode of atrial fibrillation.  She hasn't had any problems at the loop recorder site.  Today she really wants to discuss recurrent problems with midsternal chest pressure. Episodes are generally brief, lasting for a couple of minutes and resolve spontaneously. This has been occurring both at rest and with activity and worries her. She is very concerned that she has recurrent angina pectoris and new blockages. She has a remote history of angioplasty to a posterolateral branch in 2009 and points out repeatedly that her stress test was nondiagnostic and that only the cardiac catheterization revealed the presence of her blockage. She also tells me that her husband had a similar experience and clearly she has no confidence in the results of nuclear stress test. She does point out that her current retrosternal discomfort is different than the very intense pain that she experienced prior to her angioplasty.   Past Medical History  Diagnosis Date  . Adenomatous colon polyp 2006  . Allergy   . Arthritis     low back  . Cancer  [facility])     thyroid, s/p thyroidectomy 8 years ago  . Depression   . GERD (gastroesophageal reflux disease)   . Hyperlipidemia   . Hypertension   . Ulcer   . Hypothyroidism 06/12/2011  . Hiatal hernia   . CAD (coronary artery disease) 06/12/2011  . Back pain 06/13/2011  . Vertigo 06/13/2011  . Leg fracture   . Thyroid cancer ([place])   . Skin lesions, generalized 05/23/2012  . Pain of left heel 05/23/2012  . Pain in joint, ankle and foot 11/02/2012    Past Surgical History  Procedure Laterality Date  . Thyroidectomy, partial      twice  . Fracture surgery      left leg, pin and 5 bolts in place in lower tibia  . Tubal ligation      growth removed from posterior vaginal vault  . Lesion removal      vaginal  . Balloon angioplasty, artery    . Ep implantable device N/A 09/24/2015    Procedure: Loop Recorder Insertion;  Surgeon: Sanda Klein, MD;  Location: [facility];  Service: Cardiovascular;  Laterality: N/A;    Current Medications: Outpatient Prescriptions Prior to Visit  Medication Sig Dispense Refill  . ALPRAZolam (XANAX) 1 MG tablet Take 1 tablet (1 mg total) by mouth at bedtime as needed. (Patient taking differently: Take 0.5-1 mg by mouth at bedtime. ) 30 tablet 2  . apixaban (ELIQUIS) 5 MG TABS tablet Take 1 tablet (5 mg total) by mouth  2 (two) times daily. 180 tablet 3  . aspirin 325 MG EC tablet Take 325 mg by mouth daily.    Marland Kitchen atorvastatin (LIPITOR) 40 MG tablet TAKE 1 TABLET BY MOUTH DAILY AT 6 PM 30 tablet 11  . Cholecalciferol (VITAMIN D3 PO) Take 1 tablet by mouth daily after supper.    . Cyanocobalamin (VITAMIN B12 PO) Take 1 tablet by mouth daily after supper.    . levothyroxine (SYNTHROID, LEVOTHROID) 125 MCG tablet Take 1 tablet (125 mcg total) by mouth daily. (Patient taking differently: Take 125 mcg by mouth daily after supper. ) 90 tablet 3  . losartan-hydrochlorothiazide (HYZAAR) 50-12.5 MG tablet Take 1 tablet by mouth daily. 90 tablet 1  .  nitroGLYCERIN (NITROSTAT) 0.4 MG SL tablet Place 0.4 mg under the tongue every 5 (five) minutes as needed for chest pain.    Marland Kitchen ondansetron (ZOFRAN ODT) 4 MG disintegrating tablet Take 1 tablet (4 mg total) by mouth every 8 (eight) hours as needed for nausea or vomiting. 20 tablet 0   No facility-administered medications prior to visit.     Allergies:   Amoxicillin   Social History   Social History  . Marital Status: Married    Spouse Name: N/A  . Number of Children: N/A  . Years of Education: N/A   Social History Main Topics  . Smoking status: Former Research scientist (life sciences)  . Smokeless tobacco: Never Used  . Alcohol Use: No  . Drug Use: No  . Sexual Activity: Not Currently   Other Topics Concern  . None   Social History Narrative     Family History:  The patient's family history includes Cancer in her cousin, mother, and sister; Heart disease in her brother, father, and sister; Hypertension in her sister; Stroke in her mother and sister.   ROS:   Please see the history of present illness.    ROS All other systems reviewed and are negative.   PHYSICAL EXAM:   VS:  BP 122/70 mmHg  Pulse 67  Ht 5\' 2"  (1.575 m)  Wt 68.493 kg (151 lb)  BMI 27.61 kg/m2  SpO2 97%   GEN: Well nourished, well developed, in no acute distress HEENT: normal Neck: no JVD, carotid bruits, or masses Cardiac: RRR; no murmurs, rubs, or gallops,no edema ,  well-healed loop recorder site Respiratory:  clear to auscultation bilaterally, normal work of breathing GI: soft, nontender, nondistended, + BS MS: no deformity or atrophy Skin: warm and dry, no rash Neuro:  Alert and Oriented x 3, Strength and sensation are intact Psych: euthymic mood, full affect  Wt Readings from Last 3 Encounters:  01/07/16 68.493 kg (151 lb)  12/12/15 67.813 kg (149 lb 8 oz)  09/24/15 66.821 kg (147 lb 5 oz)      Studies/Labs Reviewed:   EKG:  EKG is not ordered today.   Recent Labs: 06/21/2015: TSH 1.74 12/28/2015: ALT 15; BUN  29*; Creatinine, Ser 1.20*; Hemoglobin 11.8*; Platelets 206; Potassium 3.2*; Sodium 137   Lipid Panel    Component Value Date/Time   CHOL 182 08/10/2015 0252   TRIG 50 08/10/2015 0252   HDL 48 08/10/2015 0252   CHOLHDL 3.8 08/10/2015 0252   VLDL 10 08/10/2015 0252   LDLCALC 124* 08/10/2015 0252      ASSESSMENT:    1. Coronary artery disease involving native coronary artery of native heart with angina pectoris (HCC)   2. Paroxysmal atrial fibrillation ([place])   3. Essential hypertension   4. Encounter for loop recorder  check   5. Other chest pain   6. Pre-procedure lab exam   7. Other fatigue      PLAN:  In order of problems listed above:  1. CAD: Her symptoms are compatible with atypical angina pectoris and she has known coronary atherosclerosis and previous intervention. After some discussion with decided to proceed directly with coronary angiography. We'll try to schedule her with .Dr. Martinique, who performed her previous intervention. Target LDL <70. 2. PAF: Tolerating anticoagulation without difficulty so for. Stop the Eliquis June 7 for procedure June 9. 3. HTN: good control 4. Loop recorder: Device is functioning normally. We'll continue monthly downloads to evaluate the overall prevalence of atrial fibrillation    Medication Adjustments/Labs and Tests Ordered: Current medicines are reviewed at length with the patient today.  Concerns regarding medicines are outlined above.  Medication changes, Labs and Tests ordered today are listed in the Patient Instructions below. Patient Instructions  Your physician has requested that you have a cardiac catheterization with Dr Martinique. Cardiac catheterization is used to diagnose and/or treat various heart conditions. Doctors may recommend this procedure for a number of different reasons. The most common reason is to evaluate chest pain. Chest pain can be a symptom of coronary artery disease (CAD), and cardiac catheterization can show  whether plaque is narrowing or blocking your heart's arteries. This procedure is also used to evaluate the valves, as well as measure the blood flow and oxygen levels in different parts of your heart. For further information please visit [email or web address].   Following your catheterization, you will not be allowed to drive for 3 days.  No lifting, pushing, or pulling greater that 10 pounds is allowed for 1 week.  You will be required to have the following tests prior to the procedure:  1. Blood work-the blood work can be done no more than 7 days prior to the procedure. It can be done at any [facility] lab. There is one downstairs on the first floor of this building and one in the [facility] building [phone]).  Remote monitoring is used to monitor your Pacemaker of ICD from home. This monitoring reduces the number of office visits required to check your device to one time per year. It allows [place] to keep an eye on the functioning of your device to ensure it is working properly. You are scheduled for a device check from home on Monday, June 26th, 2017. You may send your transmission at any time that day. If you have a wireless device, the transmission will be sent automatically. After your physician reviews your transmission, you will receive a postcard with your next transmission date.  Dr Sallyanne Kuster recommends that you schedule a follow-up appointment in 12 months with a pacemaker check. You will receive a reminder letter in the mail two months in advance. If you don't receive a letter, please call our office to schedule the follow-up appointment.  If you need a refill on your cardiac medications before your next appointment, please call your pharmacy.    Signed, Sanda Klein, MD  01/08/2016 8:29 AM    [facility] [address], [place], [place]  [address] Phone: [phone]; Fax: [phone]

## 2016-01-08 ENCOUNTER — Encounter: Payer: Self-pay | Admitting: Cardiovascular Disease

## 2016-01-08 DIAGNOSIS — I480 Paroxysmal atrial fibrillation: Secondary | ICD-10-CM | POA: Insufficient documentation

## 2016-01-12 LAB — CUP PACEART REMOTE DEVICE CHECK: Date Time Interrogation Session: 20170408193548

## 2016-01-12 NOTE — Progress Notes (Signed)
Carelink summary report received. Battery status OK. Normal device function. No new symptom episodes, brady, or pause episodes. No new AF episodes. 1 tachy episode, AF w RVR, +Eliquis.  Monthly summary reports and ROV/PRN

## 2016-01-15 ENCOUNTER — Telehealth: Payer: Self-pay | Admitting: Internal Medicine

## 2016-01-15 ENCOUNTER — Other Ambulatory Visit: Payer: Self-pay

## 2016-01-15 DIAGNOSIS — I25119 Atherosclerotic heart disease of native coronary artery with unspecified angina pectoris: Secondary | ICD-10-CM

## 2016-01-15 NOTE — Telephone Encounter (Signed)
Returned patient call. She thinks this is regarding upcoming cath w/ Dr. Martinique. Set up for cath when she saw Dr. Sallyanne Kuster last week for loop recorder f/u. She is a regular pt of Dr. Debara Pickett. Informed her labs ordered for [facility] which need to be done no more than 7 days before cath. She will check to see if these can be drawn by primary care.  Informed her I didn't know what else needs to be discussed but will send to primary nurse to address any procedure prep instructions, med holds, etc.

## 2016-01-15 NOTE — Telephone Encounter (Signed)
F/u call  Pt returning call to RN. Pt was unsure of who gave her a call. Please call back

## 2016-01-15 NOTE — Telephone Encounter (Signed)
No further instructions per nursing standpoint.   Will route to Dr. Debara Pickett as Juluis Rainier that patient is having LHC & if he has any advice on meds, etc.

## 2016-01-15 NOTE — Telephone Encounter (Signed)
Follow Up ° °Pt returned call//  °

## 2016-01-16 NOTE — Telephone Encounter (Signed)
Discussed with Dr. Loletha Grayer - concern for CAD, he recommended cath with Dr. Martinique - may need a stent.  Dr. Lemmie Evens

## 2016-01-16 NOTE — Telephone Encounter (Signed)
Called patient to notify her that MD has no further instructions. She voiced understanding. Advised her to follow plan as recommended by Dr. Loletha Grayer at last appt regarding cath

## 2016-01-16 NOTE — Telephone Encounter (Signed)
Noted  

## 2016-01-17 ENCOUNTER — Telehealth: Payer: Self-pay

## 2016-01-17 ENCOUNTER — Encounter: Payer: Self-pay | Admitting: Cardiovascular Disease

## 2016-01-17 DIAGNOSIS — D689 Coagulation defect, unspecified: Secondary | ICD-10-CM

## 2016-01-17 DIAGNOSIS — R5383 Other fatigue: Secondary | ICD-10-CM

## 2016-01-17 DIAGNOSIS — I25119 Atherosclerotic heart disease of native coronary artery with unspecified angina pectoris: Secondary | ICD-10-CM

## 2016-01-17 NOTE — Telephone Encounter (Signed)
Previously ordered labs cancelled and new future orders placed.

## 2016-01-17 NOTE — Addendum Note (Signed)
Addended by: Diana Eves on: 01/17/2016 11:33 AM   Modules accepted: Orders

## 2016-01-17 NOTE — Telephone Encounter (Signed)
Ralph Dowdy, [place] Truitt, CMA            Hi Colleen Wright,   I am from [facility] office and this patient would like her labs ordered on 01/07/16 to be drawn here. Can you please change the orders to "future" so I can release them.  Patient is going to schedule appointment for 01/20/16.   Thank you!  Lattie Haw, CMA

## 2016-01-20 ENCOUNTER — Other Ambulatory Visit (INDEPENDENT_AMBULATORY_CARE_PROVIDER_SITE_OTHER): Payer: Medicare Other

## 2016-01-20 DIAGNOSIS — R5383 Other fatigue: Secondary | ICD-10-CM

## 2016-01-20 DIAGNOSIS — D689 Coagulation defect, unspecified: Secondary | ICD-10-CM | POA: Diagnosis not present

## 2016-01-20 DIAGNOSIS — I25119 Atherosclerotic heart disease of native coronary artery with unspecified angina pectoris: Secondary | ICD-10-CM | POA: Diagnosis not present

## 2016-01-20 DIAGNOSIS — I209 Angina pectoris, unspecified: Secondary | ICD-10-CM | POA: Diagnosis not present

## 2016-01-20 DIAGNOSIS — I2510 Atherosclerotic heart disease of native coronary artery without angina pectoris: Secondary | ICD-10-CM | POA: Diagnosis not present

## 2016-01-20 LAB — APTT: aPTT: 30 seconds (ref 24–37)

## 2016-01-20 LAB — PROTIME-INR
INR: 1.05 (ref <1.50)
PROTHROMBIN TIME: 13.8 s (ref 11.6–15.2)

## 2016-01-21 LAB — CBC
HCT: 35 % (ref 35.0–45.0)
Hemoglobin: 11.5 g/dL — ABNORMAL LOW (ref 11.7–15.5)
MCH: 28.6 pg (ref 27.0–33.0)
MCHC: 32.9 g/dL (ref 32.0–36.0)
MCV: 87.1 fL (ref 80.0–100.0)
MPV: 10.6 fL (ref 7.5–12.5)
PLATELETS: 188 10*3/uL (ref 140–400)
RBC: 4.02 MIL/uL (ref 3.80–5.10)
RDW: 13.8 % (ref 11.0–15.0)
WBC: 7.5 10*3/uL (ref 3.8–10.8)

## 2016-01-21 LAB — BASIC METABOLIC PANEL
BUN: 22 mg/dL (ref 7–25)
CHLORIDE: 103 mmol/L (ref 98–110)
CO2: 24 mmol/L (ref 20–31)
Calcium: 9 mg/dL (ref 8.6–10.4)
Creat: 1.33 mg/dL — ABNORMAL HIGH (ref 0.60–0.93)
Glucose, Bld: 92 mg/dL (ref 65–99)
POTASSIUM: 3.9 mmol/L (ref 3.5–5.3)
SODIUM: 138 mmol/L (ref 135–146)

## 2016-01-21 LAB — TSH: TSH: 0.67 mIU/L

## 2016-01-22 ENCOUNTER — Ambulatory Visit (INDEPENDENT_AMBULATORY_CARE_PROVIDER_SITE_OTHER): Payer: Medicare Other | Admitting: *Deleted

## 2016-01-22 DIAGNOSIS — I639 Cerebral infarction, unspecified: Secondary | ICD-10-CM

## 2016-01-23 NOTE — Progress Notes (Signed)
Carelink Summary Report / Loop Recorder 

## 2016-01-24 ENCOUNTER — Encounter (HOSPITAL_COMMUNITY)
Admission: RE | Disposition: A | Discharge status: Home / Self Care | Payer: Self-pay | Source: Ambulatory Visit | Attending: Cardiology

## 2016-01-24 ENCOUNTER — Encounter (HOSPITAL_COMMUNITY): Payer: Self-pay | Admitting: Cardiology

## 2016-01-24 ENCOUNTER — Ambulatory Visit (HOSPITAL_COMMUNITY)
Admission: RE | Admit: 2016-01-24 | Discharge: 2016-01-24 | Disposition: A | Discharge status: Home / Self Care | Payer: Medicare Other | Source: Ambulatory Visit | Attending: Cardiology | Admitting: Cardiology

## 2016-01-24 ENCOUNTER — Other Ambulatory Visit: Payer: Self-pay | Admitting: Cardiology

## 2016-01-24 DIAGNOSIS — Z8249 Family history of ischemic heart disease and other diseases of the circulatory system: Secondary | ICD-10-CM | POA: Insufficient documentation

## 2016-01-24 DIAGNOSIS — Z8673 Personal history of transient ischemic attack (TIA), and cerebral infarction without residual deficits: Secondary | ICD-10-CM | POA: Insufficient documentation

## 2016-01-24 DIAGNOSIS — Z7982 Long term (current) use of aspirin: Secondary | ICD-10-CM | POA: Diagnosis not present

## 2016-01-24 DIAGNOSIS — Z87891 Personal history of nicotine dependence: Secondary | ICD-10-CM | POA: Diagnosis not present

## 2016-01-24 DIAGNOSIS — M1990 Unspecified osteoarthritis, unspecified site: Secondary | ICD-10-CM | POA: Diagnosis not present

## 2016-01-24 DIAGNOSIS — Z880 Allergy status to penicillin: Secondary | ICD-10-CM | POA: Insufficient documentation

## 2016-01-24 DIAGNOSIS — Z85850 Personal history of malignant neoplasm of thyroid: Secondary | ICD-10-CM | POA: Diagnosis not present

## 2016-01-24 DIAGNOSIS — Z7901 Long term (current) use of anticoagulants: Secondary | ICD-10-CM | POA: Insufficient documentation

## 2016-01-24 DIAGNOSIS — K219 Gastro-esophageal reflux disease without esophagitis: Secondary | ICD-10-CM | POA: Insufficient documentation

## 2016-01-24 DIAGNOSIS — K449 Diaphragmatic hernia without obstruction or gangrene: Secondary | ICD-10-CM | POA: Diagnosis not present

## 2016-01-24 DIAGNOSIS — E785 Hyperlipidemia, unspecified: Secondary | ICD-10-CM | POA: Diagnosis not present

## 2016-01-24 DIAGNOSIS — E039 Hypothyroidism, unspecified: Secondary | ICD-10-CM | POA: Diagnosis not present

## 2016-01-24 DIAGNOSIS — I480 Paroxysmal atrial fibrillation: Secondary | ICD-10-CM | POA: Diagnosis present

## 2016-01-24 DIAGNOSIS — I2510 Atherosclerotic heart disease of native coronary artery without angina pectoris: Secondary | ICD-10-CM

## 2016-01-24 DIAGNOSIS — R079 Chest pain, unspecified: Secondary | ICD-10-CM | POA: Diagnosis present

## 2016-01-24 DIAGNOSIS — Z9861 Coronary angioplasty status: Secondary | ICD-10-CM | POA: Insufficient documentation

## 2016-01-24 DIAGNOSIS — F329 Major depressive disorder, single episode, unspecified: Secondary | ICD-10-CM | POA: Diagnosis not present

## 2016-01-24 DIAGNOSIS — I10 Essential (primary) hypertension: Secondary | ICD-10-CM | POA: Diagnosis present

## 2016-01-24 DIAGNOSIS — I25118 Atherosclerotic heart disease of native coronary artery with other forms of angina pectoris: Secondary | ICD-10-CM | POA: Diagnosis present

## 2016-01-24 DIAGNOSIS — R0789 Other chest pain: Secondary | ICD-10-CM | POA: Diagnosis present

## 2016-01-24 DIAGNOSIS — I25119 Atherosclerotic heart disease of native coronary artery with unspecified angina pectoris: Secondary | ICD-10-CM

## 2016-01-24 HISTORY — PX: CARDIAC CATHETERIZATION: SHX172

## 2016-01-24 SURGERY — LEFT HEART CATH AND CORONARY ANGIOGRAPHY

## 2016-01-24 MED ORDER — MIDAZOLAM HCL 2 MG/2ML IJ SOLN
INTRAMUSCULAR | Status: DC | PRN
  Administered 2016-01-24: 1 mg via INTRAVENOUS

## 2016-01-24 MED ORDER — SODIUM CHLORIDE 0.9% FLUSH: 3.0000 mL | Freq: Two times a day (BID) | INTRAVENOUS | Status: DC

## 2016-01-24 MED ORDER — SODIUM CHLORIDE 0.9 % WEIGHT BASED INFUSION: 1.0000 mL/kg/h | INTRAVENOUS | Status: DC

## 2016-01-24 MED ORDER — HEPARIN SODIUM (PORCINE) 1000 UNIT/ML IJ SOLN
INTRAMUSCULAR | Status: DC | PRN
Start: 2016-01-24 — End: 2016-01-24
  Administered 2016-01-24: 3500 [IU] via INTRAVENOUS

## 2016-01-24 MED ORDER — SODIUM CHLORIDE 0.9 % WEIGHT BASED INFUSION
3.0000 mL/kg/h | INTRAVENOUS | Status: AC
  Administered 2016-01-24: 3 mL/kg/h via INTRAVENOUS

## 2016-01-24 MED ORDER — METOPROLOL TARTRATE 25 MG PO TABS: 25.0000 mg | ORAL_TABLET | Freq: Two times a day (BID) | ORAL | Status: DC

## 2016-01-24 MED ORDER — ASPIRIN 81 MG PO CHEW
81.0000 mg | CHEWABLE_TABLET | ORAL | Status: AC
  Administered 2016-01-24: 81 mg via ORAL

## 2016-01-24 MED ORDER — VERAPAMIL HCL 2.5 MG/ML IV SOLN
INTRAVENOUS | Status: DC | PRN
  Administered 2016-01-24: 1.2 mL via INTRA_ARTERIAL

## 2016-01-24 MED ORDER — LIDOCAINE HCL (PF) 1 % IJ SOLN
INTRAMUSCULAR | Status: DC | PRN
  Administered 2016-01-24: 2 mL

## 2016-01-24 MED ORDER — SODIUM CHLORIDE 0.9 % IV SOLN: 250.0000 mL | INTRAVENOUS | Status: DC | PRN

## 2016-01-24 MED ORDER — FENTANYL CITRATE (PF) 100 MCG/2ML IJ SOLN
INTRAMUSCULAR | Status: AC
  Filled 2016-01-24: qty 2

## 2016-01-24 MED ORDER — IOPAMIDOL (ISOVUE-370) INJECTION 76%
INTRAVENOUS | Status: DC | PRN
  Administered 2016-01-24: 85 mL via INTRA_ARTERIAL

## 2016-01-24 MED ORDER — HEPARIN (PORCINE) IN NACL 2-0.9 UNIT/ML-% IJ SOLN
INTRAMUSCULAR | Status: AC
  Filled 2016-01-24: qty 1000

## 2016-01-24 MED ORDER — FENTANYL CITRATE (PF) 100 MCG/2ML IJ SOLN
INTRAMUSCULAR | Status: DC | PRN
  Administered 2016-01-24: 25 ug via INTRAVENOUS

## 2016-01-24 MED ORDER — VERAPAMIL HCL 2.5 MG/ML IV SOLN
INTRAVENOUS | Status: AC
  Filled 2016-01-24: qty 2

## 2016-01-24 MED ORDER — LIDOCAINE HCL (PF) 1 % IJ SOLN
INTRAMUSCULAR | Status: AC
  Filled 2016-01-24: qty 30

## 2016-01-24 MED ORDER — HEPARIN SODIUM (PORCINE) 1000 UNIT/ML IJ SOLN
INTRAMUSCULAR | Status: AC
  Filled 2016-01-24: qty 1

## 2016-01-24 MED ORDER — HEPARIN (PORCINE) IN NACL 2-0.9 UNIT/ML-% IJ SOLN
INTRAMUSCULAR | Status: DC | PRN
  Administered 2016-01-24: 1500 mL

## 2016-01-24 MED ORDER — ASPIRIN 81 MG PO CHEW
CHEWABLE_TABLET | ORAL | Status: AC
  Filled 2016-01-24: qty 1

## 2016-01-24 MED ORDER — MIDAZOLAM HCL 2 MG/2ML IJ SOLN
INTRAMUSCULAR | Status: AC
  Filled 2016-01-24: qty 2

## 2016-01-24 MED ORDER — SODIUM CHLORIDE 0.9% FLUSH: 3.0000 mL | INTRAVENOUS | Status: DC | PRN

## 2016-01-24 MED ORDER — IOPAMIDOL (ISOVUE-370) INJECTION 76%
INTRAVENOUS | Status: AC
  Filled 2016-01-24: qty 100

## 2016-01-24 SURGICAL SUPPLY — 15 items
CATH INFINITI 4FR 145 PIGTAIL (CATHETERS) ×1 IMPLANT
CATH INFINITI 5 FR JL3.5 (CATHETERS) ×1 IMPLANT
CATH INFINITI 5FR ANG PIGTAIL (CATHETERS) ×1 IMPLANT
CATH INFINITI JR4 5F (CATHETERS) ×1 IMPLANT
CATH LAUNCHER 5F RADL (CATHETERS) IMPLANT
CATHETER LAUNCHER 5F RADL (CATHETERS) ×2
DEVICE RAD COMP TR BAND LRG (VASCULAR PRODUCTS) ×1 IMPLANT
GLIDESHEATH SLEND SS 6F .021 (SHEATH) ×1 IMPLANT
KIT HEART LEFT (KITS) ×2 IMPLANT
PACK CARDIAC CATHETERIZATION (CUSTOM PROCEDURE TRAY) ×2 IMPLANT
SYR MEDRAD MARK V 150ML (SYRINGE) ×2 IMPLANT
TRANSDUCER W/STOPCOCK (MISCELLANEOUS) ×2 IMPLANT
TUBING CIL FLEX 10 FLL-RA (TUBING) ×2 IMPLANT
WIRE HI TORQ VERSACORE-J 145CM (WIRE) ×1 IMPLANT
WIRE SAFE-T 1.5MM-J .035X260CM (WIRE) ×1 IMPLANT

## 2016-01-24 NOTE — Discharge Instructions (Addendum)
May resume Eliquis tomorrow 01/25/16  Start metoprolol 25 mg twice a day.  Radial Site Care Refer to this sheet in the next few weeks. These instructions provide you with information about caring for yourself after your procedure. Your health care provider may also give you more specific instructions. Your treatment has been planned according to current medical practices, but problems sometimes occur. Call your health care provider if you have any problems or questions after your procedure. WHAT TO EXPECT AFTER THE PROCEDURE After your procedure, it is typical to have the following:  Bruising at the radial site that usually fades within 1-2 weeks.  Blood collecting in the tissue (hematoma) that may be painful to the touch. It should usually decrease in size and tenderness within 1-2 weeks. HOME CARE INSTRUCTIONS  Take medicines only as directed by your health care provider.  You may shower 24-48 hours after the procedure or as directed by your health care provider. Remove the bandage (dressing) and gently wash the site with plain soap and water. Pat the area dry with a clean towel. Do not rub the site, because this may cause bleeding.  Do not take baths, swim, or use a hot tub until your health care provider approves.  Check your insertion site every day for redness, swelling, or drainage.  Do not apply powder or lotion to the site.  Do not flex or bend the affected arm for 24 hours or as directed by your health care provider.  Do not push or pull heavy objects with the affected arm for 24 hours or as directed by your health care provider.  Do not lift over 10 lb (4.5 kg) for 5 days after your procedure or as directed by your health care provider.  Ask your health care provider when it is okay to:  Return to work or school.  Resume usual physical activities or sports.  Resume sexual activity.  Do not drive home if you are discharged the same day as the procedure. Have someone  else drive you.  You may drive 24 hours after the procedure unless otherwise instructed by your health care provider.  Do not operate machinery or power tools for 24 hours after the procedure.  If your procedure was done as an outpatient procedure, which means that you went home the same day as your procedure, a responsible adult should be with you for the first 24 hours after you arrive home.  Keep all follow-up visits as directed by your health care provider. This is important. SEEK MEDICAL CARE IF:  You have a fever.  You have chills.  You have increased bleeding from the radial site. Hold pressure on the site. SEEK IMMEDIATE MEDICAL CARE IF:  You have unusual pain at the radial site.  You have redness, warmth, or swelling at the radial site.  You have drainage (other than a small amount of blood on the dressing) from the radial site.  The radial site is bleeding, and the bleeding does not stop after 30 minutes of holding steady pressure on the site.  Your arm or hand becomes pale, cool, tingly, or numb.   This information is not intended to replace advice given to you by your health care provider. Make sure you discuss any questions you have with your health care provider.   Document Released: 09/05/2010 Document Revised: 08/24/2014 Document Reviewed: 02/19/2014 Elsevier Interactive Patient Education [employer].

## 2016-01-24 NOTE — H&P (View-Only) (Signed)
Patient ID: Colleen Wright, female   DOB: 1941/04/23, 75 y.o.   MRN: [id number]    Cardiology Office Note    Date:  01/08/2016   ID:  Colleen Wright, DOB [birth year], MRN [id number]  PCP:  Howard Pouch, DO  Cardiologist:   Sanda Klein, MD   No chief complaint on file.   History of Present Illness:  Colleen Wright is a 75 y.o. female here to discuss the findings of paroxysmal atrial fibrillation on her implantable loop recorder. This became apparent not long after device implantation for an episode of cryptogenic transient ischemic attack. She is now taking anticoagulation with Eliquis and has not had any bleeding problems. She has not consistently been aware of palpitations during atrial fibrillation. She has activated the loop recorder at a time when her rhythm was normal as well as when she had one episode of atrial fibrillation. She was unaware of arrhythmia during another episode of atrial fibrillation.  She hasn't had any problems at the loop recorder site.  Today she really wants to discuss recurrent problems with midsternal chest pressure. Episodes are generally brief, lasting for a couple of minutes and resolve spontaneously. This has been occurring both at rest and with activity and worries her. She is very concerned that she has recurrent angina pectoris and new blockages. She has a remote history of angioplasty to a posterolateral branch in 2009 and points out repeatedly that her stress test was nondiagnostic and that only the cardiac catheterization revealed the presence of her blockage. She also tells me that her husband had a similar experience and clearly she has no confidence in the results of nuclear stress test. She does point out that her current retrosternal discomfort is different than the very intense pain that she experienced prior to her angioplasty.   Past Medical History  Diagnosis Date  . Adenomatous colon polyp 2006  . Allergy   . Arthritis     low back  . Cancer  [facility])     thyroid, s/p thyroidectomy 8 years ago  . Depression   . GERD (gastroesophageal reflux disease)   . Hyperlipidemia   . Hypertension   . Ulcer   . Hypothyroidism 06/12/2011  . Hiatal hernia   . CAD (coronary artery disease) 06/12/2011  . Back pain 06/13/2011  . Vertigo 06/13/2011  . Leg fracture   . Thyroid cancer ([place])   . Skin lesions, generalized 05/23/2012  . Pain of left heel 05/23/2012  . Pain in joint, ankle and foot 11/02/2012    Past Surgical History  Procedure Laterality Date  . Thyroidectomy, partial      twice  . Fracture surgery      left leg, pin and 5 bolts in place in lower tibia  . Tubal ligation      growth removed from posterior vaginal vault  . Lesion removal      vaginal  . Balloon angioplasty, artery    . Ep implantable device N/A 09/24/2015    Procedure: Loop Recorder Insertion;  Surgeon: Sanda Klein, MD;  Location: [place] [facility];  Service: Cardiovascular;  Laterality: N/A;    Current Medications: Outpatient Prescriptions Prior to Visit  Medication Sig Dispense Refill  . ALPRAZolam (XANAX) 1 MG tablet Take 1 tablet (1 mg total) by mouth at bedtime as needed. (Patient taking differently: Take 0.5-1 mg by mouth at bedtime. ) 30 tablet 2  . apixaban (ELIQUIS) 5 MG TABS tablet Take 1 tablet (5 mg total) by mouth  2 (two) times daily. 180 tablet 3  . aspirin 325 MG EC tablet Take 325 mg by mouth daily.    Marland Kitchen atorvastatin (LIPITOR) 40 MG tablet TAKE 1 TABLET BY MOUTH DAILY AT 6 PM 30 tablet 11  . Cholecalciferol (VITAMIN D3 PO) Take 1 tablet by mouth daily after supper.    . Cyanocobalamin (VITAMIN B12 PO) Take 1 tablet by mouth daily after supper.    . levothyroxine (SYNTHROID, LEVOTHROID) 125 MCG tablet Take 1 tablet (125 mcg total) by mouth daily. (Patient taking differently: Take 125 mcg by mouth daily after supper. ) 90 tablet 3  . losartan-hydrochlorothiazide (HYZAAR) 50-12.5 MG tablet Take 1 tablet by mouth daily. 90 tablet 1  .  nitroGLYCERIN (NITROSTAT) 0.4 MG SL tablet Place 0.4 mg under the tongue every 5 (five) minutes as needed for chest pain.    Marland Kitchen ondansetron (ZOFRAN ODT) 4 MG disintegrating tablet Take 1 tablet (4 mg total) by mouth every 8 (eight) hours as needed for nausea or vomiting. 20 tablet 0   No facility-administered medications prior to visit.     Allergies:   Amoxicillin   Social History   Social History  . Marital Status: Married    Spouse Name: N/A  . Number of Children: N/A  . Years of Education: N/A   Social History Main Topics  . Smoking status: Former Research scientist (life sciences)  . Smokeless tobacco: Never Used  . Alcohol Use: No  . Drug Use: No  . Sexual Activity: Not Currently   Other Topics Concern  . None   Social History Narrative     Family History:  The patient's family history includes Cancer in her cousin, mother, and sister; Heart disease in her brother, father, and sister; Hypertension in her sister; Stroke in her mother and sister.   ROS:   Please see the history of present illness.    ROS All other systems reviewed and are negative.   PHYSICAL EXAM:   VS:  BP 122/70 mmHg  Pulse 67  Ht 5\' 2"  (1.575 m)  Wt 68.493 kg (151 lb)  BMI 27.61 kg/m2  SpO2 97%   GEN: Well nourished, well developed, in no acute distress HEENT: normal Neck: no JVD, carotid bruits, or masses Cardiac: RRR; no murmurs, rubs, or gallops,no edema ,  well-healed loop recorder site Respiratory:  clear to auscultation bilaterally, normal work of breathing GI: soft, nontender, nondistended, + BS MS: no deformity or atrophy Skin: warm and dry, no rash Neuro:  Alert and Oriented x 3, Strength and sensation are intact Psych: euthymic mood, full affect  Wt Readings from Last 3 Encounters:  01/07/16 68.493 kg (151 lb)  12/12/15 67.813 kg (149 lb 8 oz)  09/24/15 66.821 kg (147 lb 5 oz)      Studies/Labs Reviewed:   EKG:  EKG is not ordered today.   Recent Labs: 06/21/2015: TSH 1.74 12/28/2015: ALT 15; BUN  29*; Creatinine, Ser 1.20*; Hemoglobin 11.8*; Platelets 206; Potassium 3.2*; Sodium 137   Lipid Panel    Component Value Date/Time   CHOL 182 08/10/2015 0252   TRIG 50 08/10/2015 0252   HDL 48 08/10/2015 0252   CHOLHDL 3.8 08/10/2015 0252   VLDL 10 08/10/2015 0252   LDLCALC 124* 08/10/2015 0252      ASSESSMENT:    1. Coronary artery disease involving native coronary artery of native heart with angina pectoris (HCC)   2. Paroxysmal atrial fibrillation ([place])   3. Essential hypertension   4. Encounter for loop recorder  check   5. Other chest pain   6. Pre-procedure lab exam   7. Other fatigue      PLAN:  In order of problems listed above:  1. CAD: Her symptoms are compatible with atypical angina pectoris and she has known coronary atherosclerosis and previous intervention. After some discussion with decided to proceed directly with coronary angiography. We'll try to schedule her with .Dr. Martinique, who performed her previous intervention. Target LDL <70. 2. PAF: Tolerating anticoagulation without difficulty so for. Stop the Eliquis June 7 for procedure June 9. 3. HTN: good control 4. Loop recorder: Device is functioning normally. We'll continue monthly downloads to evaluate the overall prevalence of atrial fibrillation    Medication Adjustments/Labs and Tests Ordered: Current medicines are reviewed at length with the patient today.  Concerns regarding medicines are outlined above.  Medication changes, Labs and Tests ordered today are listed in the Patient Instructions below. Patient Instructions  Your physician has requested that you have a cardiac catheterization with Dr Martinique. Cardiac catheterization is used to diagnose and/or treat various heart conditions. Doctors may recommend this procedure for a number of different reasons. The most common reason is to evaluate chest pain. Chest pain can be a symptom of coronary artery disease (CAD), and cardiac catheterization can show  whether plaque is narrowing or blocking your heart's arteries. This procedure is also used to evaluate the valves, as well as measure the blood flow and oxygen levels in different parts of your heart. For further information please visit [email or web address].   Following your catheterization, you will not be allowed to drive for 3 days.  No lifting, pushing, or pulling greater that 10 pounds is allowed for 1 week.  You will be required to have the following tests prior to the procedure:  1. Blood work-the blood work can be done no more than 7 days prior to the procedure. It can be done at any [facility] lab. There is one downstairs on the first floor of this building and one in the [facility] building [phone]).  Remote monitoring is used to monitor your Pacemaker of ICD from home. This monitoring reduces the number of office visits required to check your device to one time per year. It allows [place] to keep an eye on the functioning of your device to ensure it is working properly. You are scheduled for a device check from home on Monday, June 26th, 2017. You may send your transmission at any time that day. If you have a wireless device, the transmission will be sent automatically. After your physician reviews your transmission, you will receive a postcard with your next transmission date.  Dr Sallyanne Kuster recommends that you schedule a follow-up appointment in 12 months with a pacemaker check. You will receive a reminder letter in the mail two months in advance. If you don't receive a letter, please call our office to schedule the follow-up appointment.  If you need a refill on your cardiac medications before your next appointment, please call your pharmacy.    Signed, Sanda Klein, MD  01/08/2016 8:29 AM    [facility] [address], [place],   [address] Phone: [phone]; Fax: [phone]

## 2016-01-24 NOTE — Interval H&P Note (Signed)
History and Physical Interval Note:  01/24/2016 9:46 AM  Colleen Wright  has presented today for surgery, with the diagnosis of cad - cp  The various methods of treatment have been discussed with the patient and family. After consideration of risks, benefits and other options for treatment, the patient has consented to  Procedure(s): Left Heart Cath and Coronary Angiography (N/A) as a surgical intervention .  The patient's history has been reviewed, patient examined, no change in status, stable for surgery.  I have reviewed the patient's chart and labs.  Questions were answered to the patient's satisfaction.   Cath Lab Visit (complete for each Cath Lab visit)  Clinical Evaluation Leading to the Procedure:   ACS: No.  Non-ACS:    Anginal Classification: CCS III  Anti-ischemic medical therapy: Minimal Therapy (1 class of medications)  Non-Invasive Test Results: No non-invasive testing performed  Prior CABG: No previous CABG        [facility] 01/24/2016 9:46 AM

## 2016-01-27 ENCOUNTER — Encounter: Payer: Self-pay | Admitting: Cardiovascular Disease

## 2016-01-29 LAB — CUP PACEART REMOTE DEVICE CHECK: Date Time Interrogation Session: 20170508200532

## 2016-02-21 ENCOUNTER — Ambulatory Visit (INDEPENDENT_AMBULATORY_CARE_PROVIDER_SITE_OTHER): Payer: Medicare Other | Admitting: *Deleted

## 2016-02-21 ENCOUNTER — Ambulatory Visit: Payer: Medicare Other | Admitting: Family Medicine

## 2016-02-21 DIAGNOSIS — I639 Cerebral infarction, unspecified: Secondary | ICD-10-CM | POA: Diagnosis not present

## 2016-02-24 NOTE — Progress Notes (Signed)
Carelink Summary Report / Loop Recorder 

## 2016-02-25 ENCOUNTER — Encounter: Payer: Medicare Other | Admitting: Gastroenterology

## 2016-02-27 ENCOUNTER — Telehealth: Payer: Self-pay | Admitting: *Deleted

## 2016-02-27 LAB — CUP PACEART REMOTE DEVICE CHECK: Date Time Interrogation Session: 20170607200703

## 2016-02-27 NOTE — Telephone Encounter (Signed)
Called patient to ensure that she resumed taking Eliquis on 01/25/16 as instructed (medication does not appear on medication list).  Eliquis added back to patient's medication list as she reports she has been taking as instructed.  Patient is appreciative of call and denies questions or concerns at this time.

## 2016-03-16 ENCOUNTER — Other Ambulatory Visit: Payer: Self-pay | Admitting: Family Medicine

## 2016-03-16 DIAGNOSIS — I10 Essential (primary) hypertension: Secondary | ICD-10-CM

## 2016-03-18 LAB — CUP PACEART REMOTE DEVICE CHECK: Date Time Interrogation Session: 20170707210602

## 2016-03-18 NOTE — Progress Notes (Signed)
Carelink summary report received. Battery status OK. Normal device function. No new symptom episodes, tachy episodes, brady, or pause episodes. No new AF episodes. Monthly summary reports and ROV/PRN 

## 2016-03-23 ENCOUNTER — Ambulatory Visit (INDEPENDENT_AMBULATORY_CARE_PROVIDER_SITE_OTHER): Payer: Medicare Other | Admitting: *Deleted

## 2016-03-23 DIAGNOSIS — I639 Cerebral infarction, unspecified: Secondary | ICD-10-CM | POA: Diagnosis not present

## 2016-03-23 NOTE — Progress Notes (Signed)
Carelink Summary Report / Loop Recorder 

## 2016-03-26 ENCOUNTER — Other Ambulatory Visit: Payer: Self-pay | Admitting: Family Medicine

## 2016-03-26 DIAGNOSIS — I10 Essential (primary) hypertension: Secondary | ICD-10-CM

## 2016-04-15 LAB — CUP PACEART REMOTE DEVICE CHECK: MDC IDC SESS DTM: 20170806210845

## 2016-04-15 NOTE — Progress Notes (Signed)
Carelink summary report received. Battery status OK. Normal device function. No new symptom episodes, tachy episodes, brady, or pause episodes. 1 AF 0% ECG appears SR PACs. Monthly summary reports and ROV/PRN

## 2016-04-21 ENCOUNTER — Ambulatory Visit (INDEPENDENT_AMBULATORY_CARE_PROVIDER_SITE_OTHER): Payer: Medicare Other | Admitting: *Deleted

## 2016-04-21 DIAGNOSIS — I639 Cerebral infarction, unspecified: Secondary | ICD-10-CM

## 2016-04-22 NOTE — Progress Notes (Signed)
Carelink Summary Report / Loop Recorder 

## 2016-04-24 ENCOUNTER — Telehealth: Payer: Self-pay | Admitting: Family Medicine

## 2016-04-24 DIAGNOSIS — I10 Essential (primary) hypertension: Secondary | ICD-10-CM

## 2016-04-24 DIAGNOSIS — I679 Cerebrovascular disease, unspecified: Secondary | ICD-10-CM

## 2016-04-24 MED ORDER — LOSARTAN POTASSIUM-HCTZ 50-12.5 MG PO TABS: 1.0000 | ORAL_TABLET | Freq: Every day | ORAL | 0 refills | Status: DC

## 2016-04-24 NOTE — Telephone Encounter (Signed)
I have received a request for pts losartan-hctz. She is overdue for follow up on this condition (last seen here 07/2015).  - Should be seen every 6 months for chronic medical conditions.  - I will refill once and pt needs to follow up for future refills.  Please make her aware

## 2016-04-27 NOTE — Telephone Encounter (Signed)
Spoke to patient, appt. Scheduled.

## 2016-04-28 ENCOUNTER — Telehealth: Payer: Self-pay

## 2016-04-28 NOTE — Telephone Encounter (Signed)
Spoke with patient to schedule MWV, declined at this time.

## 2016-05-06 ENCOUNTER — Ambulatory Visit (INDEPENDENT_AMBULATORY_CARE_PROVIDER_SITE_OTHER): Payer: Medicare Other | Admitting: Family Medicine

## 2016-05-06 ENCOUNTER — Other Ambulatory Visit: Payer: Self-pay | Admitting: Family Medicine

## 2016-05-06 ENCOUNTER — Encounter: Payer: Self-pay | Admitting: Family Medicine

## 2016-05-06 VITALS — BP 121/80 | HR 60 | Temp 97.8°F | Resp 20 | Ht 62.0 in | Wt 154.2 lb

## 2016-05-06 DIAGNOSIS — I480 Paroxysmal atrial fibrillation: Secondary | ICD-10-CM | POA: Diagnosis not present

## 2016-05-06 DIAGNOSIS — E538 Deficiency of other specified B group vitamins: Secondary | ICD-10-CM

## 2016-05-06 DIAGNOSIS — R7309 Other abnormal glucose: Secondary | ICD-10-CM | POA: Diagnosis not present

## 2016-05-06 DIAGNOSIS — I679 Cerebrovascular disease, unspecified: Secondary | ICD-10-CM

## 2016-05-06 DIAGNOSIS — E559 Vitamin D deficiency, unspecified: Secondary | ICD-10-CM | POA: Diagnosis not present

## 2016-05-06 DIAGNOSIS — R5383 Other fatigue: Secondary | ICD-10-CM

## 2016-05-06 DIAGNOSIS — I10 Essential (primary) hypertension: Secondary | ICD-10-CM | POA: Diagnosis not present

## 2016-05-06 DIAGNOSIS — E663 Overweight: Secondary | ICD-10-CM | POA: Insufficient documentation

## 2016-05-06 DIAGNOSIS — I2510 Atherosclerotic heart disease of native coronary artery without angina pectoris: Secondary | ICD-10-CM

## 2016-05-06 DIAGNOSIS — I639 Cerebral infarction, unspecified: Secondary | ICD-10-CM

## 2016-05-06 DIAGNOSIS — E785 Hyperlipidemia, unspecified: Secondary | ICD-10-CM

## 2016-05-06 DIAGNOSIS — Z6828 Body mass index (BMI) 28.0-28.9, adult: Secondary | ICD-10-CM

## 2016-05-06 DIAGNOSIS — E890 Postprocedural hypothyroidism: Secondary | ICD-10-CM

## 2016-05-06 DIAGNOSIS — R531 Weakness: Secondary | ICD-10-CM | POA: Insufficient documentation

## 2016-05-06 DIAGNOSIS — G4700 Insomnia, unspecified: Secondary | ICD-10-CM

## 2016-05-06 LAB — VITAMIN B12: Vitamin B-12: 410 pg/mL (ref 200–1100)

## 2016-05-06 MED ORDER — LORAZEPAM 1 MG PO TABS: 0.5000 mg | ORAL_TABLET | Freq: Every day | ORAL | 3 refills | Status: DC

## 2016-05-06 MED ORDER — TRAZODONE HCL 50 MG PO TABS: 50.0000 mg | ORAL_TABLET | Freq: Every evening | ORAL | 1 refills | Status: DC | PRN

## 2016-05-06 NOTE — Progress Notes (Signed)
Subjective:    Patient ID: Colleen Wright, female    DOB: 05/17/1941, 75 y.o.   MRN: [id number]  HPI   Patient Care Team: Ma Hillock, DO as PCP - General (Family Medicine) Sanda Klein, MD as Consulting Physician (Cardiology) Pieter Partridge, DO as Consulting Physician (Neurology)   Colleen Wright is a 75 y.o. female Patient presents to office for her "insomnia issues".   Coronary artery disease/Cerebrovascular disease/Essential hypertension/Paroxysmal atrial fibrillation (HCC)/hyperlipidemia: Pt follows routinely with her cardiologist. She has known coronary atherosclerosis and previous intervention. She recently underwent  coronary angiography 01/24/2016, report below.  Sheiis on a statin with target LDL <70. Per cards. PAF and HTN  is well controlled. She is prescribed Eliquis, Lipitor 40 mg, Lopressor 25 mg BID, Hyzaar 50-12.5. She currently has an implantable loop recorder, since her cryptogenic stroke.   Fatigue/B12 deficiency/Vitamin D deficiency: Pt complains of increased fatigue over the last month. She states she had taken B12 supplement until last month. She does take a daily vit d supplement. Her thyroid was tested 3 months ago and was WNL. She reports compliance with her medications.   Elevated hemoglobin A1c: pt has had an elevated a1c in 07/2015 of 6.1. She has no prior h/o of diabetes or elevated glucose.   Insomnia: Pt has has been on xanax 0.5-1 mg QHS for 20+ years for her insomnia. She repots she needs to or she just can not sleep. Usually she only needs a 1/2 tab, but occasionally she will take a whole tab. She has never tried any other medication for her "insomnia".   01/24/2016 Cardiology report on left Heart Cath and coronary angiography:   Prox RCA lesion, 20% stenosed.  Mid RCA lesion, 50% stenosed.  Dist RCA-1 lesion, 70% stenosed.  Dist RCA-2 lesion, 80% stenosed. The lesion was previously treated with angioplasty greater than two years ago.  Prox LAD to Mid  LAD lesion, 30% stenosed.  Mid LAD lesion, 60% stenosed.  The left ventricular systolic function is normal.   1. Moderate 2 vessel CAD    -60% mid LAD    -70% distal RCA    - 80% ostial PLOM 2. Normal LV function 3. Dilated aortic root.  4. MAC  Plan: would recommend medical therapy. Her symptoms are not clearly anginal. She has been on no antianginal therapy. During her procedure she was noted to have episodes of tachycardia with HR to 130s. She is also hypertensive. The distal RCA lesion would be amenable to PCI but would be difficult due to marked tortuosity of the RCA. I will add metoprolol 25 mg bid and arrange follow up as outpatient.    08/10/2015   EXAM: MRI HEAD WITHOUT CONTRAST MRA HEAD WITHOUT CONTRAST TECHNIQUE:MRA HEAD IMPRESSION: 1. No large or proximal arterial branch occlusion within the intracranial circulation. No correctable stenosis. 2. Short-segment moderate stenosis within a proximal left M2 branch, with additional short-segment mild stenosis within the proximal right M1 segment. Anterior circulation otherwise widely patent. 3. Short-segment severe stenoses within the mid -distal P2 segments bilaterally. Electronically Signed   By: Jeannine Boga M.D.   On: 08/10/2015 06:04   08/10/2015  CLINICAL DATA:  Initial evaluation for intermittent right-sided weakness. EXAM: MRI HEAD WITHOUT CONTRAST MRA HEAD WITHOUT CONTRAST TECHNIQUE:   HEAD FINDINGS ANTERIOR CIRCULATION: Visualized distal cervical segments of the internal carotid arteries are patent with antegrade flow. Right ICA is slightly larger than the left. Petrous, cavernous, and supraclinoid segments widely patent. Right A1  segment widely patent. Left A1 segment hypoplastic but patent. Anterior communicating artery normal. Anterior cerebral arteries well opacified. Single short segment mild stenosis within the proximal right M1 segment (series 501, image 12). Right M1 segment otherwise widely patent to the  bifurcation. Right MCA bifurcation itself within normal limits. Right MCA branches distally are well opacified. Left M1 segment widely patent without stenosis or occlusion. Left MCA bifurcation normal. Short-segment moderate stenosis within proximal left M2 branch at the base of the sylvian fissure (Series 501, image 12). Left MCA branches are well opacified distally.  POSTERIOR CIRCULATION: Vertebral arteries widely patent to the vertebrobasilar junction. Posterior inferior cerebral arteries patent bilaterally. Basilar artery widely patent to its distal aspect. Small left anterior inferior cerebral artery noted. Superior cerebral arteries patent bilaterally. There are 2 left-sided SCAs. Both of the posterior cerebral arteries arise the basilar artery and are well opacified to their distal aspects. Short-segment stenoses within the P2 segments bilaterally, fairly severe in nature, perhaps slightly worse on the left (series 506, image 16). Additional atheromatous irregularity within the left P2 segment proximally. No aneurysm or vascular malformation.  IMPRESSION: MRI HEAD IMPRESSION: 1. No acute intracranial infarct or other process identified. 2. 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 3. Age-related cerebral atrophy with moderate chronic microvascular ischemic disease.  MRA HEAD IMPRESSION: 1. No large or proximal arterial branch occlusion within the intracranial circulation. No correctable stenosis. 2. Short-segment moderate stenosis within a proximal left M2 branch, with additional short-segment mild stenosis within the proximal right M1 segment. Anterior circulation otherwise widely patent. 3. Short-segment severe stenoses within the mid -distal P2 segments bilaterally. Electronically Signed   By: Jeannine Boga M.D.   On: 08/10/2015 06:04   08/10/2015 CHEST  2 VIEW COMPARISON: 1. No acute cardiopulmonary abnormality. 2. Emphysema. Electronically Signed    By: Kerby Moors M.D.   On: 08/10/2015 09:39  Past Medical History:  Diagnosis Date  . Adenomatous colon polyp 2006  . Allergy   . Arthritis    low back  . Back pain 06/13/2011  . CAD (coronary artery disease) 06/12/2011  . Depression   . GERD (gastroesophageal reflux disease)   . Hiatal hernia   . Hyperlipidemia   . Hypertension   . Hypothyroidism 06/12/2011  . Leg fracture   . Pain in joint, ankle and foot 11/02/2012  . Pain of left heel 05/23/2012  . Skin lesions, generalized 05/23/2012  . Thyroid cancer ([place])   . Ulcer   . Vertigo 06/13/2011   Allergies  Allergen Reactions  . Amoxicillin Other (See Comments)    Patient develops a very bad yeast infection.   Past Surgical History:  Procedure Laterality Date  . BALLOON ANGIOPLASTY, ARTERY    . CARDIAC CATHETERIZATION N/A 01/24/2016   Procedure: Left Heart Cath and Coronary Angiography;  Surgeon: Peter M Martinique, MD;  Location: [place] [facility];  Service: Cardiovascular;  Laterality: N/A;  . EP IMPLANTABLE DEVICE N/A 09/24/2015   Procedure: Loop Recorder Insertion;  Surgeon: Sanda Klein, MD;  Location: [place] [facility];  Service: Cardiovascular;  Laterality: N/A;  . FRACTURE SURGERY     left leg, pin and 5 bolts in place in lower tibia  . LESION REMOVAL     vaginal  . THYROIDECTOMY, PARTIAL     twice  . TUBAL LIGATION     growth removed from posterior vaginal vault   Family History  Problem Relation Age of Onset  . Cancer Mother  ovary/uterus-can't remember  . Stroke Mother   . Heart disease Father   . Heart disease Sister     bypass  . Stroke Sister   . Cancer Sister     LN, bone  . Hypertension Sister   . Heart disease Brother     bypass  . Cancer Cousin     breast   Social History   Social History  . Marital status: Married    Spouse name: N/A  . Number of children: N/A  . Years of education: N/A   Occupational History  . Not on file.   Social History Main Topics  . Smoking status:  Former Research scientist (life sciences)  . Smokeless tobacco: Never Used  . Alcohol use No  . Drug use: No  . Sexual activity: Not Currently   Other Topics Concern  . Not on file   Social History Narrative  . No narrative on file    Review of Systems Negative, with the exception of above mentioned in HPI     Objective:   Physical Exam BP 121/80 (BP Location: Right Arm, Patient Position: Sitting, Cuff Size: Normal)   Pulse 60   Temp 97.8 F (36.6 C) (Oral)   Resp 20   Ht 5\' 2"  (1.575 m)   Wt 154 lb 4 oz (70 kg)   SpO2 98%   BMI 28.21 kg/m  Gen: Afebrile. No acute distress. Nontoxic appearance, well-developed, well-nourished, Caucasian female. Very pleasant. Appears well today. HENT: AT. [place].MMM.  Eyes:Pupils Equal Round Reactive to light, Extraocular movements intact,  Conjunctiva without redness, discharge or icterus. Neck/lymp/endocrine: Supple, no lymphadenopathy CV: RRR, no edema Chest: CTAB, no wheeze or crackles Abd: Soft. Round. NTND. BS present. No Masses palpated.  Skin: No rashes, purpura or petechiae. Skin intact Neuro: Normal gait. PERLA. EOMi. Alert. Oriented x3.  Psych: Normal affect, dress and demeanor. Normal speech. Normal thought content and judgment.     Assessment & Plan:  ODALI ISMAIL is a 75 y.o. female presents for follow up on multiple chronic issues:  Coronary artery disease involving native coronary artery of native heart without angina pectoris/Cerebrovascular disease/Essential hypertension/Paroxysmal atrial fibrillation (HCC)/hyperlipidemia - Continue to follow routinely with cards.  - she has declined neurological follow ups for stroke.  - Continue eliquis, lopressor, Hyzaar, lipitor - VSS stable today.  - BMP q 6 months, last 01/20/2016 cr 1.33 - lipids yearly, due in 07/2015 LDL goal <70 - F/U q 6 months as long as continues to follow regularly with cards.   Fatigue/B12 deficiency/Vitamin D deficiency/hypothyroidism - this is a chronic issue for her. However she  does have vitamin deficiency history, thyroid disease and an elevated a1c in december, all potential causes. - VSS. HR 60. - Vitamin D (25 hydroxy) - B12 - TSH WNL 3 months ago. Continue levothyroxine 125 mcg QD and follow TSH yearly.   Elevated hemoglobin A1c/BMI 28 - counseled on diet and exercise.  - HgB A1c today - f/u 6 months if prediabetic range, immediately if new diabetic  Insomnia:  - DC xanax.  - start Ativan 0.5 mg - 1 mg QHS for 1 week and then taper down on dose until able to discontinue. - start trazodone 50 mg qhs for 1 week, then increase to 100 mg qhs (when ativan decreases).  - discussed with pt in great detail, multiple times and by AVS instructions tapering of medications. - Pt understands the goal is to eventually DC benzo and be on trazodone only (if able). Pt understands  we may need to increase trazodone. Pt understands needing to to taper off benzo.  - She is in agreement with plan.  - f/u 4 weeks.   Electronically Signed by: Howard Pouch, DO [place] [facility] [place]

## 2016-05-06 NOTE — Patient Instructions (Signed)
Trazodone 1 tab  about 1 hour before bed every night Ativan takes the place of the xanax. It is more safe for the body, but is the same type of medication. I want you to take 1/2 for one week with the trazodone and then try to stop using ativan if able.  The goal is to use trazodone only, but this can take some time to get the dose right and we also can not just stop the xanax/ativan medication quickly.  After stopping ativan you can go up to 2 tabs of the trazodone.

## 2016-05-07 ENCOUNTER — Telehealth: Payer: Self-pay | Admitting: Family Medicine

## 2016-05-07 ENCOUNTER — Other Ambulatory Visit: Payer: Self-pay | Admitting: Family Medicine

## 2016-05-07 DIAGNOSIS — I10 Essential (primary) hypertension: Secondary | ICD-10-CM

## 2016-05-07 LAB — HEMOGLOBIN A1C
HEMOGLOBIN A1C: 5.6 % (ref <5.7)
Mean Plasma Glucose: 114 mg/dL

## 2016-05-07 LAB — VITAMIN D 25 HYDROXY (VIT D DEFICIENCY, FRACTURES): Vit D, 25-Hydroxy: 43 ng/mL (ref 30–100)

## 2016-05-07 NOTE — Telephone Encounter (Signed)
Please call pt: - all her labs look good.  - her b12 is normal, but lower than her usual. She could benefit from starting the b12 OTC daily again.  - Please remind her to follow up in 4 weeks to followup on new medications and her insomnia.

## 2016-05-08 NOTE — Telephone Encounter (Signed)
Spoke with patient reviewed lab results and instructions. Patient verbalized understanding. 

## 2016-05-13 NOTE — Telephone Encounter (Signed)
Patient called and left message stating she can't take the ativan it makes her stomach upset and she feels funny she states she just needs her alprazolam back that is what works for her. Please advise.

## 2016-05-15 NOTE — Telephone Encounter (Signed)
Please advise pt to decrease dose to half pill. Would want her to continue ativan, especially since she is tapering off this type of medication and going to use trazodone for insomnia.

## 2016-05-17 LAB — CUP PACEART REMOTE DEVICE CHECK: Date Time Interrogation Session: 20170905210910

## 2016-05-17 NOTE — Progress Notes (Signed)
Carelink summary report received. Battery status OK. Normal device function. No new symptom episodes, tachy episodes, brady, or pause episodes. 5 AF- 4 w/ ECGs show artifact, appear SR w/ PACs. Monthly summary reports and ROV/PRN

## 2016-05-18 ENCOUNTER — Other Ambulatory Visit: Payer: Self-pay | Admitting: Family Medicine

## 2016-05-18 NOTE — Telephone Encounter (Signed)
Rf request fromt [facility] for alprazolam.  Denied.  Patient was switched to lorazepam.

## 2016-05-19 NOTE — Telephone Encounter (Signed)
Left message with detailed instructions on patient voice mail.

## 2016-05-21 ENCOUNTER — Telehealth: Payer: Self-pay | Admitting: Cardiovascular Disease

## 2016-05-21 ENCOUNTER — Ambulatory Visit (INDEPENDENT_AMBULATORY_CARE_PROVIDER_SITE_OTHER): Payer: Medicare Other | Admitting: *Deleted

## 2016-05-21 DIAGNOSIS — I639 Cerebral infarction, unspecified: Secondary | ICD-10-CM

## 2016-05-21 NOTE — Telephone Encounter (Signed)
F/u Message ° °Pt returning RN call. Please call back to discuss  °

## 2016-05-21 NOTE — Telephone Encounter (Signed)
LMOVM for pt return call.  

## 2016-05-21 NOTE — Telephone Encounter (Signed)
New Message  Pt voiced needing help with her transmission.  Please f/u

## 2016-05-21 NOTE — Telephone Encounter (Signed)
Informed pt her remote transmission will come through automatically. Pt verbalized understanding.

## 2016-05-21 NOTE — Telephone Encounter (Signed)
LMVM for pt to return call.   

## 2016-05-22 NOTE — Progress Notes (Signed)
Carelink Summary Report / Loop Recorder 

## 2016-06-01 ENCOUNTER — Telehealth: Payer: Self-pay | Admitting: *Deleted

## 2016-06-01 NOTE — Telephone Encounter (Signed)
Patient called and states she is not taking the ativan she states it does not work for her she wants her xanax back. She states she likes it here but if we cant Rx this for her she will have to get another Dr. Please advise.

## 2016-06-01 NOTE — Telephone Encounter (Addendum)
I called Ms. Newnam to discuss her medication use. She is adamant she wants to  have her xanax prescribed again. She has not made an appt to follow up on her insomnia and trial of ativan taper (off) and trazodone taper (up) for her "insomnia". I again advised her, her medication use is a discussion, her follow up appt is necessary and I will continue to provide her with good care and offer safier options when appropriate. I informed her if she would like to discuss or restart low dose xanax, she will need to follow up. She stated she is not coming back up because there is nothing to discuss and she will get one of her other doctors to prescribed her xanax.  Electronically Signed by: Howard Pouch, DO [facility]

## 2016-06-22 ENCOUNTER — Ambulatory Visit (INDEPENDENT_AMBULATORY_CARE_PROVIDER_SITE_OTHER): Payer: Medicare Other | Admitting: *Deleted

## 2016-06-22 DIAGNOSIS — I639 Cerebral infarction, unspecified: Secondary | ICD-10-CM

## 2016-06-22 NOTE — Progress Notes (Signed)
Carelink Summary Report / Loop Recorder 

## 2016-06-23 ENCOUNTER — Encounter: Payer: Self-pay | Admitting: Cardiovascular Disease

## 2016-06-28 DIAGNOSIS — Z23 Encounter for immunization: Secondary | ICD-10-CM | POA: Diagnosis not present

## 2016-06-28 LAB — CUP PACEART REMOTE DEVICE CHECK
Implantable Pulse Generator Implant Date: 20170207
MDC IDC SESS DTM: 20171005214047

## 2016-06-28 NOTE — Progress Notes (Signed)
Carelink summary report received. Battery status OK. Normal device function. No new symptom episodes, tachy episodes, brady, or pause episodes. No new AF episodes. Monthly summary reports and ROV/PRN 

## 2016-07-15 ENCOUNTER — Other Ambulatory Visit: Payer: Self-pay | Admitting: *Deleted

## 2016-07-15 MED ORDER — LEVOTHYROXINE SODIUM 125 MCG PO TABS: 125.0000 ug | ORAL_TABLET | Freq: Every day | ORAL | 2 refills | Status: DC

## 2016-07-15 NOTE — Telephone Encounter (Signed)
Refilled levothyroxine. Patient next due for follow up on thyroid in June 2018.

## 2016-07-20 ENCOUNTER — Ambulatory Visit (INDEPENDENT_AMBULATORY_CARE_PROVIDER_SITE_OTHER): Payer: Medicare Other | Admitting: *Deleted

## 2016-07-20 DIAGNOSIS — I639 Cerebral infarction, unspecified: Secondary | ICD-10-CM | POA: Diagnosis not present

## 2016-07-21 NOTE — Progress Notes (Signed)
Carelink Summary Report / Loop Recorder 

## 2016-07-31 ENCOUNTER — Other Ambulatory Visit: Payer: Self-pay | Admitting: Internal Medicine

## 2016-08-07 LAB — CUP PACEART REMOTE DEVICE CHECK
Implantable Pulse Generator Implant Date: 20170207
MDC IDC SESS DTM: 20171104233718

## 2016-08-07 NOTE — Progress Notes (Signed)
Carelink summary report received. Battery status OK. Normal device function. No new symptom episodes, tachy episodes, brady, or pause episodes. 1 AF 0%, ECG appears SR PACs/noise artifact. +eliquis. Monthly summary reports and ROV/PRN

## 2016-08-19 ENCOUNTER — Ambulatory Visit (INDEPENDENT_AMBULATORY_CARE_PROVIDER_SITE_OTHER): Payer: Medicare Other | Admitting: *Deleted

## 2016-08-19 DIAGNOSIS — I639 Cerebral infarction, unspecified: Secondary | ICD-10-CM | POA: Diagnosis not present

## 2016-08-20 NOTE — Progress Notes (Signed)
Carelink Summary Report / Loop Recorder 

## 2016-08-27 LAB — CUP PACEART REMOTE DEVICE CHECK
Date Time Interrogation Session: 20171205004208
Implantable Pulse Generator Implant Date: 20170207

## 2016-09-01 ENCOUNTER — Other Ambulatory Visit: Payer: Self-pay | Admitting: Cardiovascular Disease

## 2016-09-18 ENCOUNTER — Ambulatory Visit (INDEPENDENT_AMBULATORY_CARE_PROVIDER_SITE_OTHER): Payer: Medicare Other | Admitting: *Deleted

## 2016-09-18 DIAGNOSIS — I639 Cerebral infarction, unspecified: Secondary | ICD-10-CM

## 2016-09-21 NOTE — Progress Notes (Signed)
Carelink Summary Report / Loop Recorder 

## 2016-09-24 ENCOUNTER — Telehealth: Payer: Self-pay | Admitting: Family Medicine

## 2016-09-24 NOTE — Telephone Encounter (Signed)
Patient would like to switch PCP from Dr. Raoul Pitch of [facility] to Dr. Birdie Riddle of [facility].   If both providers can send a message in confirmation, I'd appreciate it.  Thank you.  -LL

## 2016-09-24 NOTE — Telephone Encounter (Signed)
I cannot accept a transfer at this time due to very large panel size

## 2016-09-25 NOTE — Telephone Encounter (Signed)
Ok with me 

## 2016-09-25 NOTE — Telephone Encounter (Signed)
Called Pt to make aware and pt stated that [place] has already scheduled NP appt. And that husband will be est care with [facility] in May. I let [facility] know this and she will  accept as a NP.

## 2016-10-06 LAB — CUP PACEART REMOTE DEVICE CHECK
MDC IDC PG IMPLANT DT: 20170207
MDC IDC SESS DTM: 20180104004139

## 2016-10-15 LAB — CUP PACEART REMOTE DEVICE CHECK
Date Time Interrogation Session: 20180203004158
MDC IDC PG IMPLANT DT: 20170207

## 2016-10-15 NOTE — Progress Notes (Signed)
Carelink summary report received. Battery status OK. Normal device function. No new symptom episodes, tachy episodes, brady, or pause episodes. 12 AF 0.1% +Eliquis, available ECGs appear SR w/ PACS/PVCs.. Monthly summary reports and ROV/PRN

## 2016-10-16 ENCOUNTER — Telehealth: Payer: Self-pay | Admitting: Cardiovascular Disease

## 2016-10-16 NOTE — Telephone Encounter (Signed)
Returned call to patient she stated she has received a bill twice this year for loop recorder.Stated she has never received a bill before.Advised to call her insurance it may be you have not paid detectible this year.Stated she will call insurance.

## 2016-10-16 NOTE — Telephone Encounter (Signed)
New Message  Pt voiced wanting to know if she needs to come and visit as much as she needs due to her being billed with the device she has every month.  Pt voiced she spoke with billing department and they directed her over to [place].  Please f/u with pt

## 2016-10-19 ENCOUNTER — Ambulatory Visit (INDEPENDENT_AMBULATORY_CARE_PROVIDER_SITE_OTHER): Payer: Medicare Other | Admitting: *Deleted

## 2016-10-19 DIAGNOSIS — I639 Cerebral infarction, unspecified: Secondary | ICD-10-CM

## 2016-10-19 NOTE — Progress Notes (Signed)
Carelink Summary Report / Loop Recorder 

## 2016-10-31 LAB — CUP PACEART REMOTE DEVICE CHECK
Date Time Interrogation Session: 20180305013556
MDC IDC PG IMPLANT DT: 20170207

## 2016-10-31 NOTE — Progress Notes (Signed)
Carelink summary report received. Battery status OK. Normal device function. No new symptom episodes, tachy episodes, brady, or pause episodes. 5 AF 0% available ECGs appear SB/SR w/ PACs. Monthly summary reports and ROV/PRN

## 2016-11-04 ENCOUNTER — Other Ambulatory Visit: Payer: Self-pay | Admitting: Family Medicine

## 2016-11-17 ENCOUNTER — Ambulatory Visit (INDEPENDENT_AMBULATORY_CARE_PROVIDER_SITE_OTHER): Payer: Medicare Other | Admitting: *Deleted

## 2016-11-17 DIAGNOSIS — I639 Cerebral infarction, unspecified: Secondary | ICD-10-CM

## 2016-11-18 NOTE — Progress Notes (Signed)
Carelink Summary Report / Loop Recorder 

## 2016-11-19 LAB — CUP PACEART REMOTE DEVICE CHECK
Implantable Pulse Generator Implant Date: 20170207
MDC IDC SESS DTM: 20180404014528

## 2016-11-24 ENCOUNTER — Other Ambulatory Visit: Payer: Self-pay | Admitting: Family Medicine

## 2016-11-24 DIAGNOSIS — I10 Essential (primary) hypertension: Secondary | ICD-10-CM

## 2016-11-25 DIAGNOSIS — H2513 Age-related nuclear cataract, bilateral: Secondary | ICD-10-CM | POA: Diagnosis not present

## 2016-11-25 DIAGNOSIS — H43813 Vitreous degeneration, bilateral: Secondary | ICD-10-CM | POA: Diagnosis not present

## 2016-12-03 ENCOUNTER — Ambulatory Visit (INDEPENDENT_AMBULATORY_CARE_PROVIDER_SITE_OTHER): Payer: Medicare Other | Admitting: Family Medicine

## 2016-12-03 ENCOUNTER — Encounter: Payer: Self-pay | Admitting: Family Medicine

## 2016-12-03 VITALS — BP 121/82 | HR 76 | Temp 98.1°F | Resp 16 | Ht 62.0 in | Wt 154.2 lb

## 2016-12-03 DIAGNOSIS — E785 Hyperlipidemia, unspecified: Secondary | ICD-10-CM | POA: Diagnosis not present

## 2016-12-03 DIAGNOSIS — I6529 Occlusion and stenosis of unspecified carotid artery: Secondary | ICD-10-CM | POA: Insufficient documentation

## 2016-12-03 DIAGNOSIS — R300 Dysuria: Secondary | ICD-10-CM | POA: Diagnosis not present

## 2016-12-03 DIAGNOSIS — I6523 Occlusion and stenosis of bilateral carotid arteries: Secondary | ICD-10-CM | POA: Diagnosis not present

## 2016-12-03 DIAGNOSIS — Z1231 Encounter for screening mammogram for malignant neoplasm of breast: Secondary | ICD-10-CM | POA: Diagnosis not present

## 2016-12-03 DIAGNOSIS — I10 Essential (primary) hypertension: Secondary | ICD-10-CM | POA: Diagnosis not present

## 2016-12-03 DIAGNOSIS — E890 Postprocedural hypothyroidism: Secondary | ICD-10-CM

## 2016-12-03 LAB — POCT URINALYSIS DIPSTICK
BILIRUBIN UA: NEGATIVE
Glucose, UA: NEGATIVE
KETONES UA: NEGATIVE
Nitrite, UA: NEGATIVE
PH UA: 5 (ref 5.0–8.0)
Protein, UA: NEGATIVE
SPEC GRAV UA: 1.025 (ref 1.010–1.025)
Urobilinogen, UA: 0.2 E.U./dL

## 2016-12-03 LAB — LIPID PANEL
CHOL/HDL RATIO: 3
Cholesterol: 138 mg/dL (ref 0–200)
HDL: 47.1 mg/dL (ref >39.00)
LDL Cholesterol: 68 mg/dL (ref 0–99)
NONHDL: 90.89
Triglycerides: 115 mg/dL (ref 0.0–149.0)
VLDL: 23 mg/dL (ref 0.0–40.0)

## 2016-12-03 LAB — CBC WITH DIFFERENTIAL/PLATELET
BASOS PCT: 0.7 % (ref 0.0–3.0)
Basophils Absolute: 0 10*3/uL (ref 0.0–0.1)
EOS ABS: 0.2 10*3/uL (ref 0.0–0.7)
EOS PCT: 2.9 % (ref 0.0–5.0)
HCT: 36.3 % (ref 36.0–46.0)
HEMOGLOBIN: 12.3 g/dL (ref 12.0–15.0)
LYMPHS PCT: 35.6 % (ref 12.0–46.0)
Lymphs Abs: 2.5 10*3/uL (ref 0.7–4.0)
MCHC: 33.9 g/dL (ref 30.0–36.0)
MCV: 87.7 fl (ref 78.0–100.0)
MONO ABS: 0.5 10*3/uL (ref 0.1–1.0)
MONOS PCT: 7.3 % (ref 3.0–12.0)
Neutro Abs: 3.8 10*3/uL (ref 1.4–7.7)
Neutrophils Relative %: 53.5 % (ref 43.0–77.0)
Platelets: 197 10*3/uL (ref 150.0–400.0)
RBC: 4.13 Mil/uL (ref 3.87–5.11)
RDW: 13.2 % (ref 11.5–15.5)
WBC: 7.1 10*3/uL (ref 4.0–10.5)

## 2016-12-03 LAB — BASIC METABOLIC PANEL
BUN: 23 mg/dL (ref 6–23)
CALCIUM: 9.6 mg/dL (ref 8.4–10.5)
CO2: 30 mEq/L (ref 19–32)
CREATININE: 1.22 mg/dL — AB (ref 0.40–1.20)
Chloride: 102 mEq/L (ref 96–112)
GFR: 45.33 mL/min — ABNORMAL LOW (ref >60.00)
Glucose, Bld: 87 mg/dL (ref 70–99)
Potassium: 4.3 mEq/L (ref 3.5–5.1)
SODIUM: 139 meq/L (ref 135–145)

## 2016-12-03 LAB — HEPATIC FUNCTION PANEL
ALT: 13 U/L (ref 0–35)
AST: 16 U/L (ref 0–37)
Albumin: 4 g/dL (ref 3.5–5.2)
Alkaline Phosphatase: 89 U/L (ref 39–117)
BILIRUBIN DIRECT: 0.1 mg/dL (ref 0.0–0.3)
BILIRUBIN TOTAL: 0.5 mg/dL (ref 0.2–1.2)
Total Protein: 6.9 g/dL (ref 6.0–8.3)

## 2016-12-03 LAB — TSH: TSH: 0.55 u[IU]/mL (ref 0.35–4.50)

## 2016-12-03 MED ORDER — CEPHALEXIN 500 MG PO CAPS: 500.0000 mg | ORAL_CAPSULE | Freq: Two times a day (BID) | ORAL | 0 refills | Status: AC

## 2016-12-03 MED ORDER — ALPRAZOLAM 0.5 MG PO TABS: 0.5000 mg | ORAL_TABLET | Freq: Every evening | ORAL | 3 refills | Status: DC | PRN

## 2016-12-03 NOTE — Progress Notes (Signed)
Pre visit review using our clinic review tool, if applicable. No additional management support is needed unless otherwise documented below in the visit note. 

## 2016-12-03 NOTE — Assessment & Plan Note (Signed)
New to provider, ongoing for pt.  Check labs.  Adjust meds prn

## 2016-12-03 NOTE — Assessment & Plan Note (Signed)
Chronic problem.  Well controlled today.  Asymptomatic.  Check labs.  No anticipated med changes.  Will follow. 

## 2016-12-03 NOTE — Assessment & Plan Note (Signed)
New.  Pt's UA shows trace blood and trace leuks.  Start Keflex for UTI and await urine culture.  Will follow.

## 2016-12-03 NOTE — Progress Notes (Signed)
   Subjective:    Patient ID: Colleen Wright, female    DOB: 09-16-1940, 76 y.o.   MRN: [id number]  HPI Pt is transferring care.  HTN- chronic problem, on Losartan HCTZ and Metoprolol.  Denies CP, SOB above baseline, HAs, visual changes, edema.  Hyperlipidemia- chronic problem, on Lipitor daily.  Denies abd pain, N/V, myalgias.  Hypothyroid- chronic problem, on Levothyroxine 117mcg daily.  'i stay tired but I keep my 68 yr old great grandbaby'.    Carotid artery stenosis- pt has hx of this in 2016.  Has not had imaging since.  R side 0-39%, L side 40-79% depending on how it's read.  Dysuria- sxs started 2-3 days ago.  Denies increased frequency.  No recent incontinence.  No fevers.  Insomnia- 'i want my alprazolam back, that's why I come here'.  Pt reports Dr Raoul Pitch took her Alprazolam and started her on Lorazepam and Trazodone.  This made her sick.  Pt has been on Alprazolam for 30 years, 'and it ain't do nothing'.   Review of Systems For ROS see HPI     Objective:   Physical Exam  Constitutional: She is oriented to person, place, and time. She appears well-developed and well-nourished. No distress.  HENT:  Head: Normocephalic and atraumatic.  Eyes: Conjunctivae and EOM are normal. Pupils are equal, round, and reactive to light.  Neck: Normal range of motion. Neck supple. No thyromegaly present.  Cardiovascular: Normal rate, regular rhythm, normal heart sounds and intact distal pulses.   No murmur heard. Pulmonary/Chest: Effort normal and breath sounds normal. No respiratory distress.  Abdominal: Soft. She exhibits no distension. There is no tenderness.  Musculoskeletal: She exhibits no edema.  Lymphadenopathy:    She has no cervical adenopathy.  Neurological: She is alert and oriented to person, place, and time.  Skin: Skin is warm and dry.  Psychiatric: She has a normal mood and affect. Her behavior is normal.  Vitals reviewed.         Assessment & Plan:

## 2016-12-03 NOTE — Assessment & Plan Note (Signed)
Chronic problem, on Lipitor daily.  Asymptomatic.  Check labs.  Adjust meds prn

## 2016-12-03 NOTE — Patient Instructions (Addendum)
Schedule your Medicare Wellness Visit w/ Colleen Wright at your convenience Follow up with me in 6 months to recheck BP and cholesterol We'll notify you of your lab results and make any changes if needed Start the Keflex twice daily for the UTI Drink plenty of fluids Continue to work on healthy diet and regular exercise We'll call you with your carotid ultrasound appt Restart the Alprazolam nightly for sleep Call with any questions or concerns Happy [place]!!!

## 2016-12-03 NOTE — Assessment & Plan Note (Signed)
New to provider, pt last had imaging 18 months ago.  Will repeat today to determine if intervention is needed.  Pt is on statin, Eliquis, ASA.  Will follow.

## 2016-12-05 LAB — URINE CULTURE

## 2016-12-07 ENCOUNTER — Other Ambulatory Visit: Payer: Self-pay | Admitting: Family Medicine

## 2016-12-17 ENCOUNTER — Ambulatory Visit (INDEPENDENT_AMBULATORY_CARE_PROVIDER_SITE_OTHER): Payer: Medicare Other | Admitting: *Deleted

## 2016-12-17 DIAGNOSIS — I639 Cerebral infarction, unspecified: Secondary | ICD-10-CM | POA: Diagnosis not present

## 2016-12-21 NOTE — Progress Notes (Signed)
Carelink Summary Report / Loop Recorder 

## 2016-12-24 ENCOUNTER — Ambulatory Visit: Payer: Medicare Other

## 2016-12-25 DIAGNOSIS — Z85828 Personal history of other malignant neoplasm of skin: Secondary | ICD-10-CM | POA: Diagnosis not present

## 2016-12-25 DIAGNOSIS — L814 Other melanin hyperpigmentation: Secondary | ICD-10-CM | POA: Diagnosis not present

## 2016-12-25 DIAGNOSIS — L281 Prurigo nodularis: Secondary | ICD-10-CM | POA: Diagnosis not present

## 2016-12-25 DIAGNOSIS — D1801 Hemangioma of skin and subcutaneous tissue: Secondary | ICD-10-CM | POA: Diagnosis not present

## 2016-12-25 DIAGNOSIS — L570 Actinic keratosis: Secondary | ICD-10-CM | POA: Diagnosis not present

## 2016-12-25 DIAGNOSIS — D225 Melanocytic nevi of trunk: Secondary | ICD-10-CM | POA: Diagnosis not present

## 2016-12-25 DIAGNOSIS — L821 Other seborrheic keratosis: Secondary | ICD-10-CM | POA: Diagnosis not present

## 2016-12-30 ENCOUNTER — Other Ambulatory Visit: Payer: Self-pay | Admitting: Family Medicine

## 2016-12-30 DIAGNOSIS — I10 Essential (primary) hypertension: Secondary | ICD-10-CM

## 2016-12-31 LAB — CUP PACEART REMOTE DEVICE CHECK
Date Time Interrogation Session: 20180504021303
MDC IDC PG IMPLANT DT: 20170207

## 2016-12-31 IMAGING — DX DG CHEST 2V
2 series · 2 of 2 positions shown · non-contrast
Comparison: 02/07/2012

CLINICAL DATA: Left extremity weakness.  The hypertension.

EXAM:
CHEST  2 VIEW

[w chest pa]
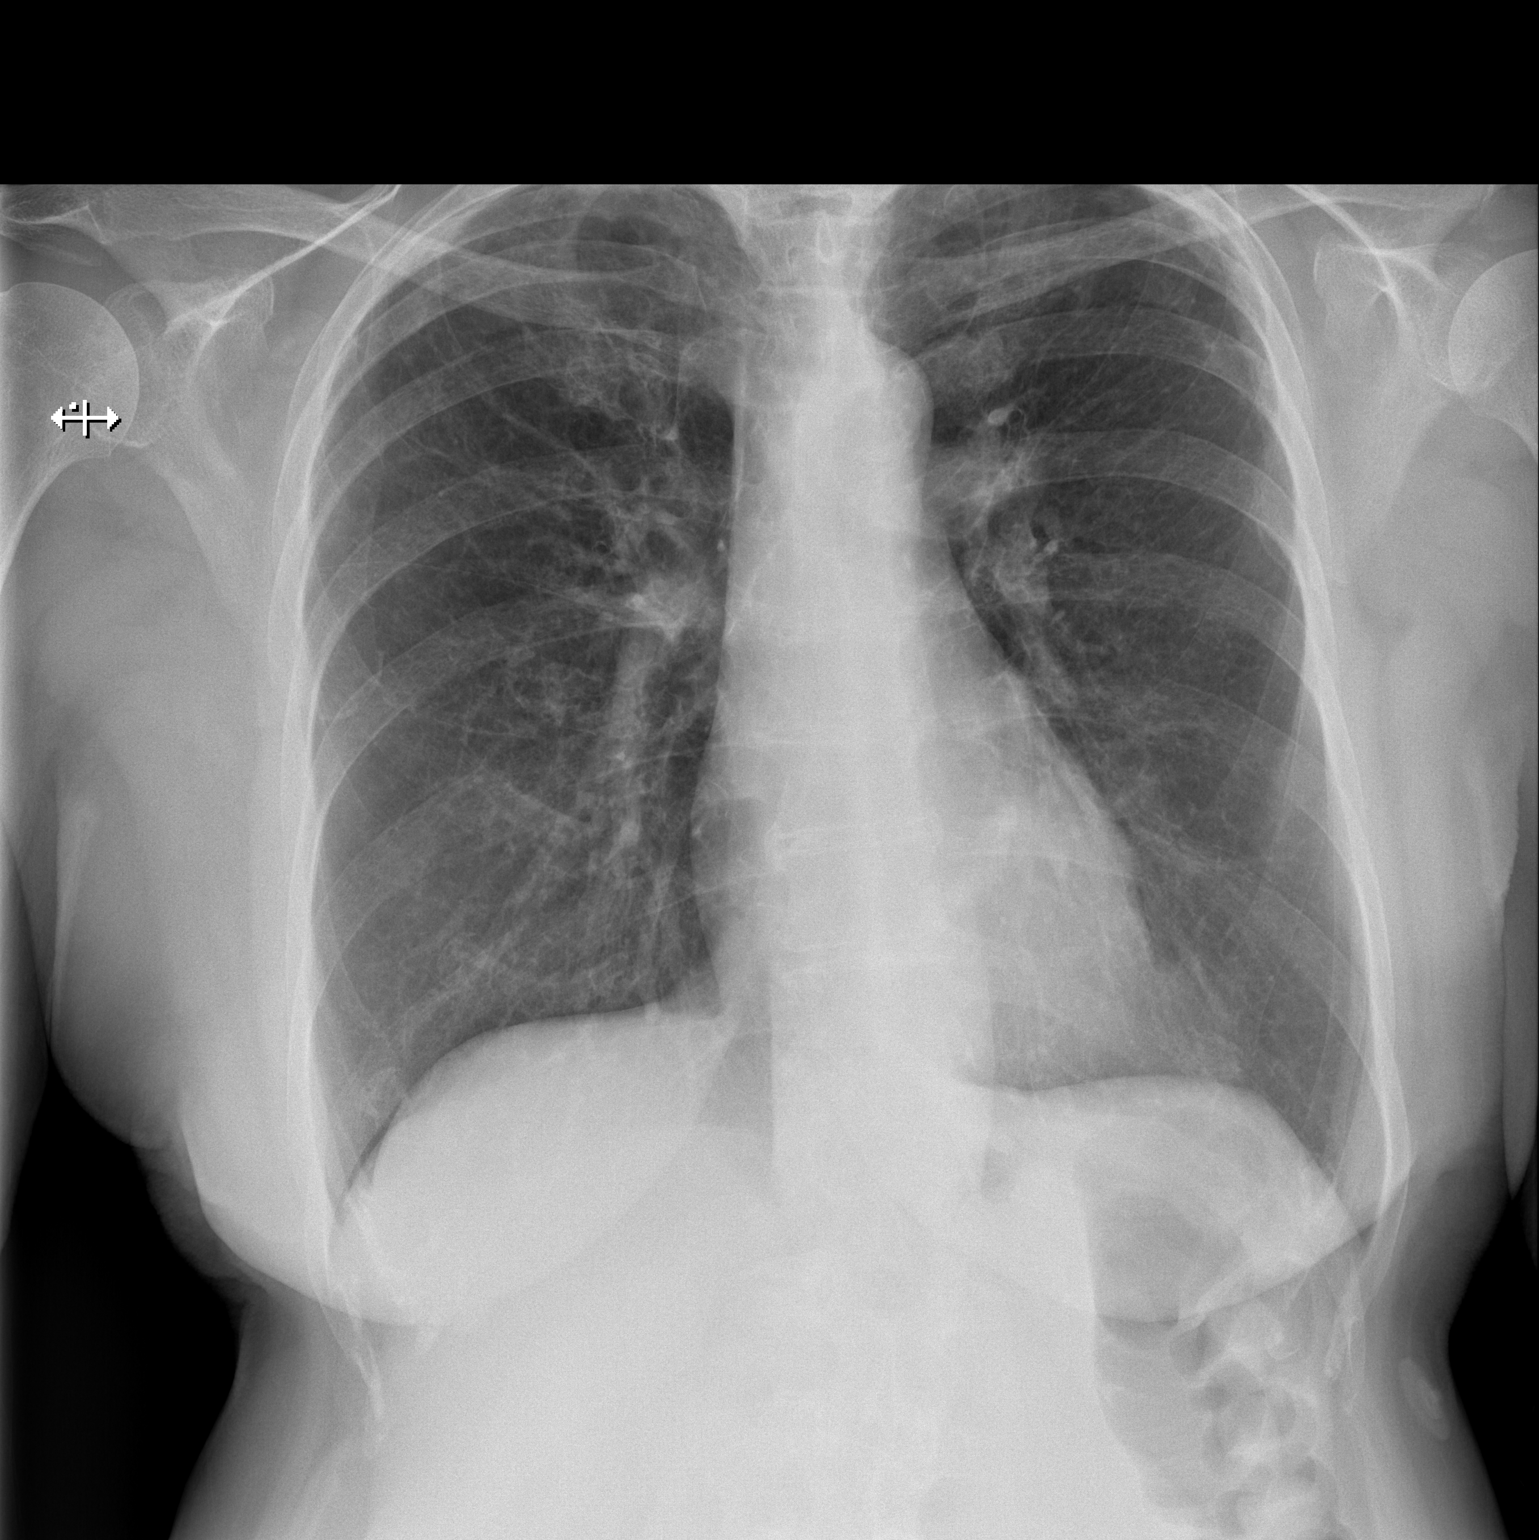

[w chest lat]
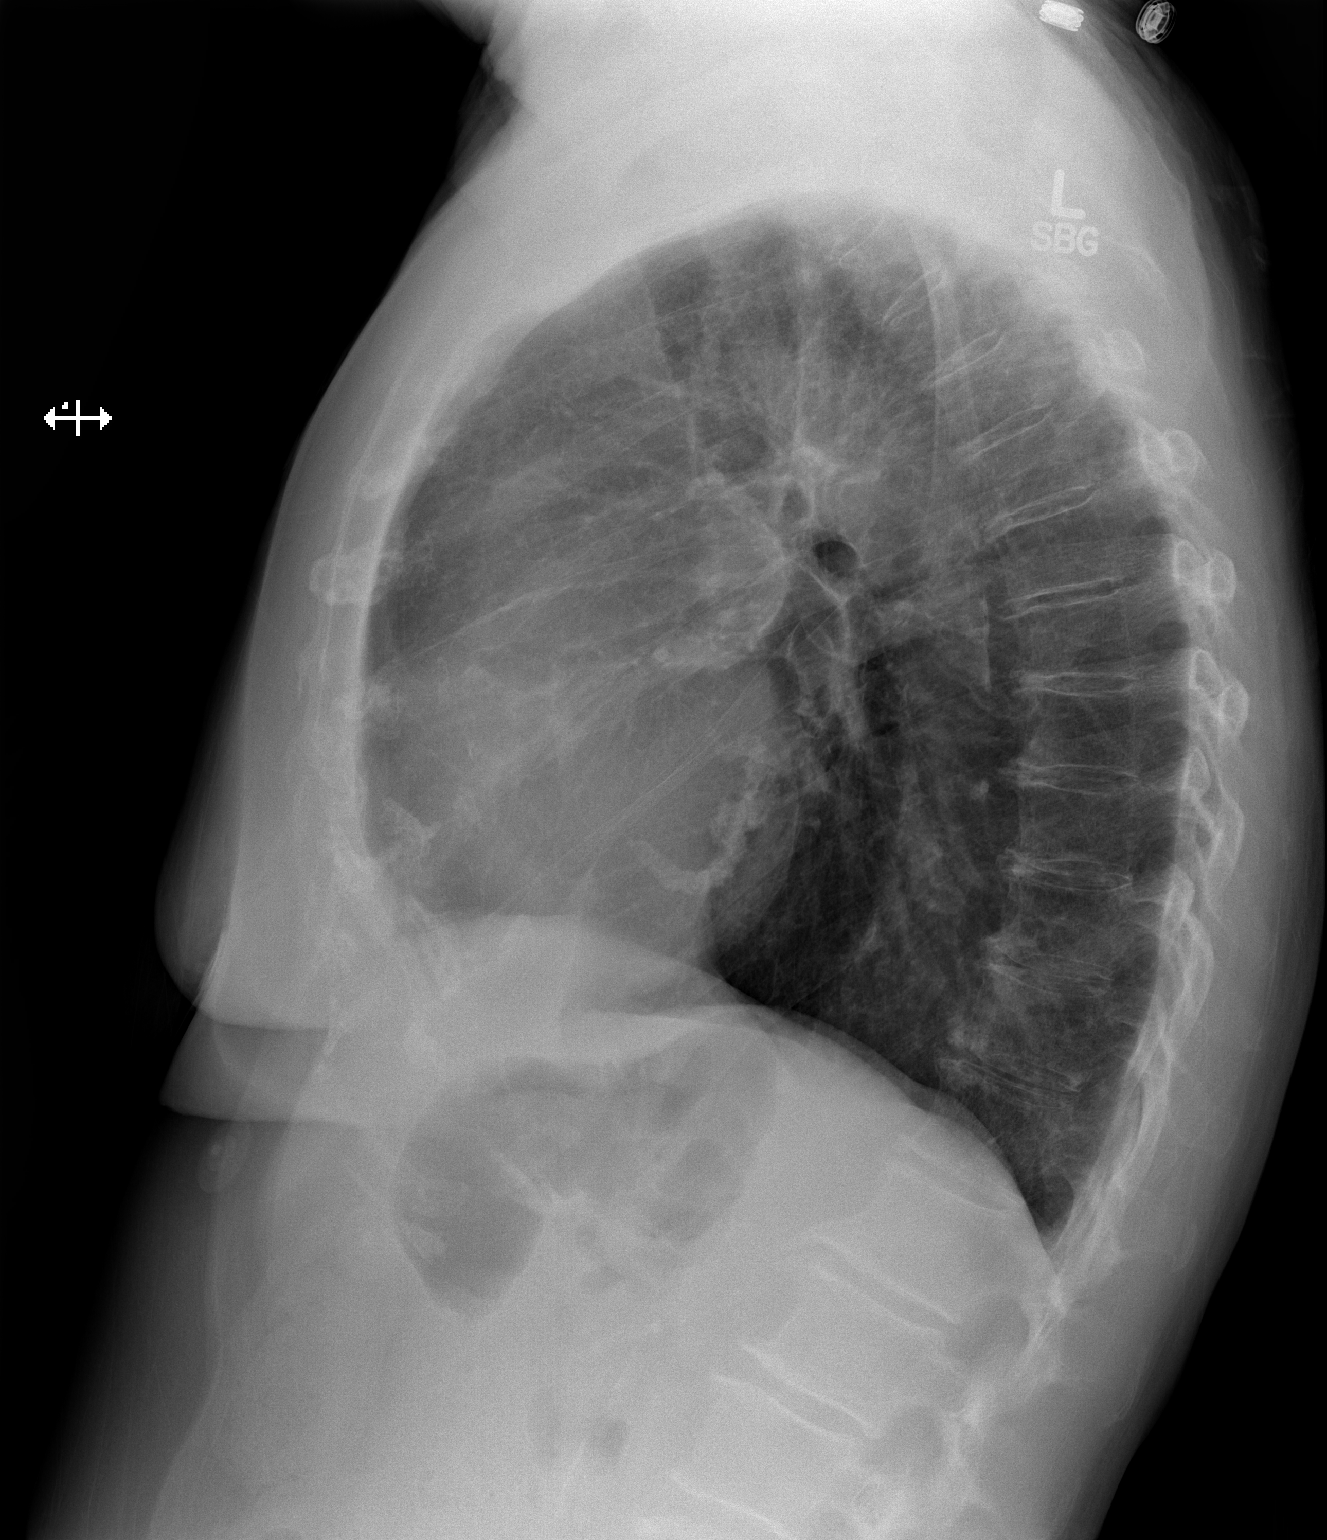

[2 of 2 positions shown; findings below may reference images not displayed]

FINDINGS: Heart size is normal. No pleural effusion or edema. No airspace
consolidation. Coarsened interstitial markings of emphysema
identified.
IMPRESSION: 1. No acute cardiopulmonary abnormality.
2. Emphysema.

## 2017-01-09 DIAGNOSIS — L237 Allergic contact dermatitis due to plants, except food: Secondary | ICD-10-CM | POA: Diagnosis not present

## 2017-01-18 ENCOUNTER — Ambulatory Visit (INDEPENDENT_AMBULATORY_CARE_PROVIDER_SITE_OTHER): Payer: Medicare Other | Admitting: *Deleted

## 2017-01-18 DIAGNOSIS — I639 Cerebral infarction, unspecified: Secondary | ICD-10-CM

## 2017-01-18 NOTE — Progress Notes (Signed)
Carelink Summary Report / Loop Recorder 

## 2017-01-20 ENCOUNTER — Ambulatory Visit: Payer: Medicare Other | Admitting: Family Medicine

## 2017-01-20 LAB — CUP PACEART REMOTE DEVICE CHECK
Implantable Pulse Generator Implant Date: 20170207
MDC IDC SESS DTM: 20180603024208

## 2017-02-03 ENCOUNTER — Other Ambulatory Visit: Payer: Self-pay | Admitting: Family Medicine

## 2017-02-15 ENCOUNTER — Ambulatory Visit (INDEPENDENT_AMBULATORY_CARE_PROVIDER_SITE_OTHER): Payer: Medicare Other | Admitting: *Deleted

## 2017-02-15 DIAGNOSIS — I639 Cerebral infarction, unspecified: Secondary | ICD-10-CM

## 2017-02-16 NOTE — Progress Notes (Signed)
Carelink Summary Report / Loop Recorder 

## 2017-02-25 LAB — CUP PACEART REMOTE DEVICE CHECK
Date Time Interrogation Session: 20180703023927
MDC IDC PG IMPLANT DT: 20170207

## 2017-02-28 ENCOUNTER — Other Ambulatory Visit: Payer: Self-pay | Admitting: Family Medicine

## 2017-03-03 ENCOUNTER — Other Ambulatory Visit: Payer: Self-pay | Admitting: Family Medicine

## 2017-03-17 ENCOUNTER — Ambulatory Visit (INDEPENDENT_AMBULATORY_CARE_PROVIDER_SITE_OTHER): Payer: Medicare Other | Admitting: *Deleted

## 2017-03-17 DIAGNOSIS — I639 Cerebral infarction, unspecified: Secondary | ICD-10-CM | POA: Diagnosis not present

## 2017-03-18 NOTE — Progress Notes (Signed)
Carelink Summary Report / Loop Recorder 

## 2017-03-24 ENCOUNTER — Telehealth: Payer: Self-pay | Admitting: Family Medicine

## 2017-03-24 NOTE — Telephone Encounter (Signed)
Patient thinks she's getting a UTI, says she has burning and urgency with urinating. Patient was wondering if she could get some Cipro called in. Patient was suggested to do an E-Visit since she didn't want to come in the office for an appt, but declined stating that she didn't know anything about that app. Patient uses [facility] in [place]. Please advise.

## 2017-03-25 NOTE — Telephone Encounter (Signed)
Cannot send in a prescription without pt being seen. Unfortunately this is office protocol

## 2017-03-29 LAB — CUP PACEART REMOTE DEVICE CHECK
Date Time Interrogation Session: 20180802033837
MDC IDC PG IMPLANT DT: 20170207

## 2017-04-06 ENCOUNTER — Other Ambulatory Visit: Payer: Self-pay | Admitting: Family Medicine

## 2017-04-06 NOTE — Telephone Encounter (Signed)
Medication filled to pharmacy as requested.   

## 2017-04-06 NOTE — Telephone Encounter (Signed)
Last OV 12/03/16 Alprazolam last filled 12/03/16 #30 with 3

## 2017-04-07 ENCOUNTER — Other Ambulatory Visit: Payer: Self-pay | Admitting: General Practice

## 2017-04-07 MED ORDER — LEVOTHYROXINE SODIUM 125 MCG PO TABS: 125.0000 ug | ORAL_TABLET | Freq: Every day | ORAL | 1 refills | Status: DC

## 2017-04-16 ENCOUNTER — Ambulatory Visit (INDEPENDENT_AMBULATORY_CARE_PROVIDER_SITE_OTHER): Payer: Medicare Other | Admitting: *Deleted

## 2017-04-16 DIAGNOSIS — I639 Cerebral infarction, unspecified: Secondary | ICD-10-CM | POA: Diagnosis not present

## 2017-04-20 NOTE — Progress Notes (Signed)
Loop recorder summary report 

## 2017-04-22 LAB — CUP PACEART REMOTE DEVICE CHECK
Implantable Pulse Generator Implant Date: 20170207
MDC IDC SESS DTM: 20180901035033

## 2017-05-08 ENCOUNTER — Other Ambulatory Visit: Payer: Self-pay | Admitting: Family Medicine

## 2017-05-08 DIAGNOSIS — I10 Essential (primary) hypertension: Secondary | ICD-10-CM

## 2017-05-10 NOTE — Telephone Encounter (Signed)
Last OV 12/03/16 Alprazolam last filled 04/06/17 #30 with 0

## 2017-05-17 ENCOUNTER — Ambulatory Visit (INDEPENDENT_AMBULATORY_CARE_PROVIDER_SITE_OTHER): Payer: Medicare Other | Admitting: *Deleted

## 2017-05-17 DIAGNOSIS — I639 Cerebral infarction, unspecified: Secondary | ICD-10-CM | POA: Diagnosis not present

## 2017-05-17 NOTE — Progress Notes (Signed)
Carelink Summary Report / Loop Recorder 

## 2017-05-18 LAB — CUP PACEART REMOTE DEVICE CHECK
Date Time Interrogation Session: 20181001093953
MDC IDC PG IMPLANT DT: 20170207

## 2017-05-19 ENCOUNTER — Telehealth: Payer: Self-pay | Admitting: *Deleted

## 2017-05-19 NOTE — Telephone Encounter (Signed)
Called patient to schedule  Loop Recorde rfollow up with Dr. Sallyanne Kuster.  Patient states she enver hears any results from her remote device checks.  She is not even sure the transmission goes through.  Please call.

## 2017-05-19 NOTE — Telephone Encounter (Signed)
I explained that [name]' monitor is working appropriately and that she would typically only hear from [place] if her monitor was not working or if something needed to be addressed. She verbalizes understanding. I advised her that she could view the results on MyChart- she declines and states that she is "76 years old, I can't do that." She is appreciative of call.

## 2017-05-28 ENCOUNTER — Other Ambulatory Visit: Payer: Self-pay | Admitting: Family Medicine

## 2017-05-28 ENCOUNTER — Other Ambulatory Visit: Payer: Self-pay | Admitting: *Deleted

## 2017-05-28 MED ORDER — METOPROLOL TARTRATE 25 MG PO TABS: 25.0000 mg | ORAL_TABLET | Freq: Two times a day (BID) | ORAL | 0 refills | Status: DC

## 2017-06-04 ENCOUNTER — Encounter: Payer: Self-pay | Admitting: Family Medicine

## 2017-06-04 ENCOUNTER — Ambulatory Visit (INDEPENDENT_AMBULATORY_CARE_PROVIDER_SITE_OTHER): Payer: Medicare Other | Admitting: Family Medicine

## 2017-06-04 VITALS — BP 124/80 | HR 72 | Temp 98.1°F | Resp 16 | Ht 62.0 in | Wt 155.2 lb

## 2017-06-04 DIAGNOSIS — E785 Hyperlipidemia, unspecified: Secondary | ICD-10-CM | POA: Diagnosis not present

## 2017-06-04 DIAGNOSIS — I6523 Occlusion and stenosis of bilateral carotid arteries: Secondary | ICD-10-CM

## 2017-06-04 DIAGNOSIS — Z23 Encounter for immunization: Secondary | ICD-10-CM | POA: Diagnosis not present

## 2017-06-04 DIAGNOSIS — E890 Postprocedural hypothyroidism: Secondary | ICD-10-CM

## 2017-06-04 DIAGNOSIS — R319 Hematuria, unspecified: Secondary | ICD-10-CM | POA: Diagnosis not present

## 2017-06-04 DIAGNOSIS — I10 Essential (primary) hypertension: Secondary | ICD-10-CM

## 2017-06-04 LAB — POCT URINALYSIS DIPSTICK
BILIRUBIN UA: NEGATIVE
GLUCOSE UA: NEGATIVE
Ketones, UA: NEGATIVE
LEUKOCYTES UA: NEGATIVE
Protein, UA: NEGATIVE
Spec Grav, UA: 1.025 (ref 1.010–1.025)
Urobilinogen, UA: 0.2 E.U./dL
pH, UA: 5 (ref 5.0–8.0)

## 2017-06-04 NOTE — Progress Notes (Signed)
Chelley, Please ask Colleen Wright to switch from metoprolol tartrate to metoprolol succinate 50 mg once daily and stop the losartan HCT. After a week, please ask her to send [place] 4-5 days of random BP recordings at rest.  She is also overdue for an office appointment - we can reevaluate her BP meds at that time face to face. Thanks. MCr

## 2017-06-04 NOTE — Assessment & Plan Note (Signed)
Chronic problem.  Asymptomatic at this time.  Check labs.  Adjust meds prn. 

## 2017-06-04 NOTE — Patient Instructions (Signed)
Follow up as scheduled for your Medicare Wellness Visit Please schedule a lab appt for next week so we can check your labs We will call you with your Carotid Ultrasound appt Continue to drink plenty of fluids Call with any questions or concerns Happy Fall!!!

## 2017-06-04 NOTE — Assessment & Plan Note (Signed)
Chronic problem.  Pt was never called to schedule her carotid doppler in April when I originally ordered it.  She will get this done in the next 2 weeks.

## 2017-06-04 NOTE — Addendum Note (Signed)
Addended by: Davis Gourd on: 06/04/2017 04:33 PM   Modules accepted: Orders

## 2017-06-04 NOTE — Assessment & Plan Note (Signed)
Chronic problem.  Pt's BP is well controlled today but she has adjust her BP meds on her own.  She is now taking the Losartan HCTZ every other day and the Metoprolol once daily.  She reports she feels much better since making these changes- no longer dizzy, energy has improved.  Cautioned her that she should not be adjusting medications without first discussing this with her cardiologists.  I will make sure they get a note alerting them to the changes she has made.

## 2017-06-04 NOTE — Progress Notes (Signed)
   Subjective:    Patient ID: Colleen Wright, female    DOB: August 02, 1941, 76 y.o.   MRN: [id number]  HPI HTN- chronic problem, on Losartan HCTZ daily (pt is taking every other day as this minimizes dizziness and side effects) and Metoprolol BID (pt taking once daily) w/ adequate control.  No CP, SOB above baseline, HAs, visual changes.  Hyperlipidemia- chronic problem, on Lipitor 40mg   By pt has only had 2 dispenses in 4 months.  No abd pain, N/V.  Hypothyroid- chronic problem, on Levothyroxine 146mcg daily.  Pt reports energy is better since decreasing her BP meds  Carotid artery stenosis- pt had dopplers ordered 12/03/16 but she did not have this done.   Review of Systems For ROS see HPI     Objective:   Physical Exam  Constitutional: She is oriented to person, place, and time. She appears well-developed and well-nourished. No distress.  HENT:  Head: Normocephalic and atraumatic.  Eyes: Pupils are equal, round, and reactive to light. Conjunctivae and EOM are normal.  Neck: Normal range of motion. Neck supple. No thyromegaly present.  Cardiovascular: Normal rate, normal heart sounds and intact distal pulses.   No murmur heard. Irregularly irregular S1/S2  Pulmonary/Chest: Effort normal and breath sounds normal. No respiratory distress.  Abdominal: Soft. She exhibits no distension. There is no tenderness.  Musculoskeletal: She exhibits no edema.  Lymphadenopathy:    She has no cervical adenopathy.  Neurological: She is alert and oriented to person, place, and time.  Skin: Skin is warm and dry.  Psychiatric: She has a normal mood and affect. Her behavior is normal.          Assessment & Plan:

## 2017-06-04 NOTE — Assessment & Plan Note (Signed)
Chronic problem.  Tolerating statin w/o difficulty.  Check labs.  Adjust meds prn  

## 2017-06-07 ENCOUNTER — Other Ambulatory Visit: Payer: Self-pay | Admitting: General Practice

## 2017-06-07 LAB — URINE CULTURE
MICRO NUMBER:: 81171770
SPECIMEN QUALITY:: ADEQUATE

## 2017-06-07 MED ORDER — CEPHALEXIN 500 MG PO CAPS: 500.0000 mg | ORAL_CAPSULE | Freq: Two times a day (BID) | ORAL | 0 refills | Status: DC

## 2017-06-10 ENCOUNTER — Other Ambulatory Visit (INDEPENDENT_AMBULATORY_CARE_PROVIDER_SITE_OTHER): Payer: Medicare Other

## 2017-06-10 DIAGNOSIS — E785 Hyperlipidemia, unspecified: Secondary | ICD-10-CM

## 2017-06-10 DIAGNOSIS — E890 Postprocedural hypothyroidism: Secondary | ICD-10-CM | POA: Diagnosis not present

## 2017-06-10 DIAGNOSIS — I10 Essential (primary) hypertension: Secondary | ICD-10-CM

## 2017-06-10 LAB — HEPATIC FUNCTION PANEL
ALT: 16 U/L (ref 0–35)
AST: 20 U/L (ref 0–37)
Albumin: 4 g/dL (ref 3.5–5.2)
Alkaline Phosphatase: 82 U/L (ref 39–117)
Bilirubin, Direct: 0.2 mg/dL (ref 0.0–0.3)
TOTAL PROTEIN: 6.9 g/dL (ref 6.0–8.3)
Total Bilirubin: 0.6 mg/dL (ref 0.2–1.2)

## 2017-06-10 LAB — LIPID PANEL
Cholesterol: 130 mg/dL (ref 0–200)
HDL: 48.3 mg/dL (ref >39.00)
LDL Cholesterol: 64 mg/dL (ref 0–99)
NonHDL: 81.52
Total CHOL/HDL Ratio: 3
Triglycerides: 87 mg/dL (ref 0.0–149.0)
VLDL: 17.4 mg/dL (ref 0.0–40.0)

## 2017-06-10 LAB — CBC WITH DIFFERENTIAL/PLATELET
BASOS ABS: 0 10*3/uL (ref 0.0–0.1)
Basophils Relative: 0.7 % (ref 0.0–3.0)
EOS ABS: 0.2 10*3/uL (ref 0.0–0.7)
Eosinophils Relative: 3.8 % (ref 0.0–5.0)
HEMATOCRIT: 38.1 % (ref 36.0–46.0)
HEMOGLOBIN: 12.9 g/dL (ref 12.0–15.0)
LYMPHS PCT: 39.5 % (ref 12.0–46.0)
Lymphs Abs: 2.5 10*3/uL (ref 0.7–4.0)
MCHC: 33.7 g/dL (ref 30.0–36.0)
MCV: 88.9 fl (ref 78.0–100.0)
Monocytes Absolute: 0.5 10*3/uL (ref 0.1–1.0)
Monocytes Relative: 7.7 % (ref 3.0–12.0)
NEUTROS ABS: 3.1 10*3/uL (ref 1.4–7.7)
Neutrophils Relative %: 48.3 % (ref 43.0–77.0)
PLATELETS: 205 10*3/uL (ref 150.0–400.0)
RBC: 4.29 Mil/uL (ref 3.87–5.11)
RDW: 13.1 % (ref 11.5–15.5)
WBC: 6.4 10*3/uL (ref 4.0–10.5)

## 2017-06-10 LAB — BASIC METABOLIC PANEL
BUN: 20 mg/dL (ref 6–23)
CHLORIDE: 102 meq/L (ref 96–112)
CO2: 32 meq/L (ref 19–32)
Calcium: 9.6 mg/dL (ref 8.4–10.5)
Creatinine, Ser: 1.05 mg/dL (ref 0.40–1.20)
GFR: 53.83 mL/min — ABNORMAL LOW (ref >60.00)
GLUCOSE: 92 mg/dL (ref 70–99)
POTASSIUM: 4.1 meq/L (ref 3.5–5.1)
SODIUM: 140 meq/L (ref 135–145)

## 2017-06-10 LAB — TSH: TSH: 0.33 u[IU]/mL — ABNORMAL LOW (ref 0.35–4.50)

## 2017-06-11 ENCOUNTER — Telehealth: Payer: Self-pay

## 2017-06-11 ENCOUNTER — Other Ambulatory Visit: Payer: Medicare Other

## 2017-06-11 MED ORDER — METOPROLOL SUCCINATE ER 50 MG PO TB24: 50.0000 mg | ORAL_TABLET | Freq: Every day | ORAL | 3 refills | Status: DC

## 2017-06-11 NOTE — Telephone Encounter (Signed)
Patient advised of recommendations. Verbalized understanding and agreed with plan. Rx(s) sent to pharmacy electronically.

## 2017-06-11 NOTE — Telephone Encounter (Signed)
Patient has an appt scheduled for 08/24/2017 with Dr C.

## 2017-06-11 NOTE — Telephone Encounter (Signed)
Sanda Klein, MD at 06/04/2017 3:00 PM   Status: Signed    Chelley, Please ask Mrs. Bertino to switch from metoprolol tartrate to metoprolol succinate 50 mg once daily and stop the losartan HCT. After a week, please ask her to send [place] 4-5 days of random BP recordings at rest.  She is also overdue for an office appointment - we can reevaluate her BP meds at that time face to face. Thanks. MCr

## 2017-06-14 ENCOUNTER — Other Ambulatory Visit: Payer: Self-pay

## 2017-06-14 ENCOUNTER — Other Ambulatory Visit: Payer: Self-pay | Admitting: Family Medicine

## 2017-06-14 MED ORDER — LEVOTHYROXINE SODIUM 112 MCG PO TABS: 112.0000 ug | ORAL_TABLET | Freq: Every day | ORAL | 0 refills | Status: DC

## 2017-06-16 ENCOUNTER — Ambulatory Visit (INDEPENDENT_AMBULATORY_CARE_PROVIDER_SITE_OTHER): Payer: Medicare Other | Admitting: *Deleted

## 2017-06-16 DIAGNOSIS — I639 Cerebral infarction, unspecified: Secondary | ICD-10-CM

## 2017-06-17 ENCOUNTER — Ambulatory Visit: Payer: Medicare Other

## 2017-06-17 NOTE — Progress Notes (Signed)
Carelink Summary Report / Loop Recorder 

## 2017-06-18 ENCOUNTER — Inpatient Hospital Stay (HOSPITAL_COMMUNITY): Admission: RE | Admit: 2017-06-18 | Payer: Medicare Other | Source: Ambulatory Visit

## 2017-06-18 LAB — CUP PACEART REMOTE DEVICE CHECK
Date Time Interrogation Session: 20181031093910
MDC IDC PG IMPLANT DT: 20170207

## 2017-06-24 DIAGNOSIS — Z1212 Encounter for screening for malignant neoplasm of rectum: Secondary | ICD-10-CM | POA: Diagnosis not present

## 2017-06-24 DIAGNOSIS — Z1211 Encounter for screening for malignant neoplasm of colon: Secondary | ICD-10-CM | POA: Diagnosis not present

## 2017-06-24 LAB — COLOGUARD: Cologuard: NEGATIVE

## 2017-07-06 ENCOUNTER — Encounter: Payer: Self-pay | Admitting: General Practice

## 2017-07-16 ENCOUNTER — Other Ambulatory Visit: Payer: Self-pay | Admitting: Family Medicine

## 2017-07-16 ENCOUNTER — Encounter (HOSPITAL_COMMUNITY): Payer: Medicare Other

## 2017-07-16 ENCOUNTER — Ambulatory Visit (INDEPENDENT_AMBULATORY_CARE_PROVIDER_SITE_OTHER): Payer: Medicare Other | Admitting: *Deleted

## 2017-07-16 ENCOUNTER — Other Ambulatory Visit: Payer: Self-pay | Admitting: General Practice

## 2017-07-16 ENCOUNTER — Other Ambulatory Visit (INDEPENDENT_AMBULATORY_CARE_PROVIDER_SITE_OTHER): Payer: Medicare Other

## 2017-07-16 DIAGNOSIS — R5383 Other fatigue: Secondary | ICD-10-CM

## 2017-07-16 DIAGNOSIS — I639 Cerebral infarction, unspecified: Secondary | ICD-10-CM | POA: Diagnosis not present

## 2017-07-16 DIAGNOSIS — R7989 Other specified abnormal findings of blood chemistry: Secondary | ICD-10-CM | POA: Diagnosis not present

## 2017-07-16 LAB — TSH: TSH: 0.91 u[IU]/mL (ref 0.35–4.50)

## 2017-07-16 MED ORDER — ALPRAZOLAM 0.5 MG PO TABS: ORAL_TABLET | ORAL | 1 refills | Status: DC

## 2017-07-16 NOTE — Telephone Encounter (Signed)
Medication filled to pharmacy as requested.  Pt made aware.  

## 2017-07-16 NOTE — Progress Notes (Signed)
Carelink Summary Report / Loop Recorder 

## 2017-07-16 NOTE — Telephone Encounter (Signed)
Last OV 06/04/17 Alprazolam last filled 05/10/17 #30 with 1

## 2017-07-18 ENCOUNTER — Other Ambulatory Visit: Payer: Self-pay | Admitting: Family Medicine

## 2017-07-27 LAB — CUP PACEART REMOTE DEVICE CHECK
Date Time Interrogation Session: 20181130110842
MDC IDC PG IMPLANT DT: 20170207

## 2017-07-30 ENCOUNTER — Ambulatory Visit (HOSPITAL_COMMUNITY)
Admission: RE | Admit: 2017-07-30 | Discharge: 2017-07-30 | Disposition: A | Discharge status: Home / Self Care | Payer: Medicare Other | Source: Ambulatory Visit | Attending: Cardiovascular Disease | Admitting: Cardiovascular Disease

## 2017-07-30 DIAGNOSIS — I6523 Occlusion and stenosis of bilateral carotid arteries: Secondary | ICD-10-CM | POA: Diagnosis not present

## 2017-07-30 DIAGNOSIS — Z1231 Encounter for screening mammogram for malignant neoplasm of breast: Secondary | ICD-10-CM | POA: Diagnosis not present

## 2017-07-30 LAB — HM MAMMOGRAPHY

## 2017-08-03 ENCOUNTER — Encounter: Payer: Self-pay | Admitting: General Practice

## 2017-08-16 ENCOUNTER — Ambulatory Visit (INDEPENDENT_AMBULATORY_CARE_PROVIDER_SITE_OTHER): Payer: Medicare Other | Admitting: *Deleted

## 2017-08-16 DIAGNOSIS — I639 Cerebral infarction, unspecified: Secondary | ICD-10-CM

## 2017-08-16 NOTE — Progress Notes (Signed)
Carelink Summary Report / Loop Recorder 

## 2017-08-24 ENCOUNTER — Encounter: Payer: Self-pay | Admitting: Cardiovascular Disease

## 2017-08-24 ENCOUNTER — Ambulatory Visit (INDEPENDENT_AMBULATORY_CARE_PROVIDER_SITE_OTHER): Payer: Medicare Other | Admitting: Cardiovascular Disease

## 2017-08-24 VITALS — BP 132/70 | HR 63 | Ht 62.0 in | Wt 157.0 lb

## 2017-08-24 DIAGNOSIS — I2510 Atherosclerotic heart disease of native coronary artery without angina pectoris: Secondary | ICD-10-CM

## 2017-08-24 DIAGNOSIS — I10 Essential (primary) hypertension: Secondary | ICD-10-CM

## 2017-08-24 DIAGNOSIS — Z4509 Encounter for adjustment and management of other cardiac device: Secondary | ICD-10-CM | POA: Diagnosis not present

## 2017-08-24 DIAGNOSIS — Z7901 Long term (current) use of anticoagulants: Secondary | ICD-10-CM | POA: Diagnosis not present

## 2017-08-24 DIAGNOSIS — I480 Paroxysmal atrial fibrillation: Secondary | ICD-10-CM

## 2017-08-24 NOTE — Patient Instructions (Signed)
Dr Croitoru recommends that you schedule a follow-up appointment in 12 months. You will receive a reminder letter in the mail two months in advance. If you don't receive a letter, please call our office to schedule the follow-up appointment.  If you need a refill on your cardiac medications before your next appointment, please call your pharmacy. 

## 2017-08-24 NOTE — Progress Notes (Signed)
Patient ID: Colleen Wright, female   DOB: 02-16-1941, 77 y.o.   MRN: [id number]    Cardiology Office Note    Date:  08/24/2017   ID:  Colleen Wright, DOB [birth year], MRN [id number]  PCP:  Midge Minium, MD  Cardiologist:   Sanda Klein, MD   Chief Complaint  Patient presents with  . Follow-up  Atrial fibrillation, coronary and carotid disease.  History of Present Illness:  Colleen Wright is a 77 y.o. 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  Cardiac catheterization in June 2017 showed a 50% mid RCA, 70% distal RCA, farther downstream 80% distal RCA/PL branch stenosis as well as 60% mid LAD stenosis. She has normal left ventricle systolic function.  The patient specifically denies any chest pain with exertion, dyspnea at rest or with exertion, orthopnea, paroxysmal nocturnal dyspnea, syncope, palpitations, focal neurological deficits, intermittent claudication, lower extremity edema, unexplained weight gain, cough, hemoptysis or wheezing.  She has occasional chest discomfort that is not associated with physical activity. Once it occurred after she took a sudden gulp of cold liquid. She has occasional orthostatic dizziness but this does not bother her much.  Past Medical History:  Diagnosis Date  . Adenomatous colon polyp 2006  . Allergy   . Arthritis    low back  . Back pain 06/13/2011  . CAD (coronary artery disease) 06/12/2011  . Depression   . GERD (gastroesophageal reflux disease)   . Hiatal hernia   . Hyperlipidemia   . Hypertension   . Hypothyroidism  06/12/2011  . Leg fracture   . Pain in joint, ankle and foot 11/02/2012  . Pain of left heel 05/23/2012  . Skin lesions, generalized 05/23/2012  . Thyroid cancer ([place])   . Ulcer   . Vertigo 06/13/2011    Past Surgical History:  Procedure Laterality Date  . BALLOON ANGIOPLASTY, ARTERY    . CARDIAC CATHETERIZATION N/A 01/24/2016   Procedure: Left Heart Cath and Coronary Angiography;  Surgeon: Peter M Martinique, MD;  Location: [facility];  Service: Cardiovascular;  Laterality: N/A;  . EP IMPLANTABLE DEVICE N/A 09/24/2015   Procedure: Loop Recorder Insertion;  Surgeon: Sanda Klein, MD;  Location: [facility];  Service: Cardiovascular;  Laterality: N/A;  . FRACTURE SURGERY     left leg, pin and 5 bolts in place in lower tibia  . LESION REMOVAL     vaginal  . THYROIDECTOMY, PARTIAL     twice  . TUBAL LIGATION     growth removed from posterior vaginal vault    Current Medications: Outpatient Medications Prior to Visit  Medication Sig Dispense Refill  . ALPRAZolam (XANAX) 0.5 MG tablet TAKE 1 TABLET BY MOUTH EVERY NIGHT AT BEDTIME AS NEEDED FOR ANXIETY 30 tablet 1  . atorvastatin (LIPITOR) 40 MG tablet TAKE 1 TABLET BY MOUTH DAILY AT 6 PM 90 tablet 0  . ELIQUIS 5 MG TABS tablet TAKE 1 TABLET(5 MG) BY MOUTH TWICE DAILY 60 tablet 5  . levothyroxine (SYNTHROID, LEVOTHROID) 112 MCG tablet TAKE 1 TABLET(112 MCG) BY MOUTH DAILY 30 tablet 6  . metoprolol succinate (TOPROL-XL) 50 MG 24 hr tablet Take 1 tablet (50  mg total) by mouth daily. Take with or immediately following a meal. 90 tablet 3  . nitroGLYCERIN (NITROSTAT) 0.4 MG SL tablet Place 0.4 mg under the tongue every 5 (five) minutes as needed for chest pain.    . cephALEXin (KEFLEX) 500 MG capsule Take 1 capsule (500 mg total) by mouth 2 (two) times daily. 10 capsule 0  . Cyanocobalamin (VITAMIN B12 PO) Take 1 tablet by mouth daily after supper.     No facility-administered medications prior to visit.      Allergies:    Amoxicillin   Social History   Socioeconomic History  . Marital status: Married    Spouse name: None  . Number of children: None  . Years of education: None  . Highest education level: None  Social Needs  . Financial resource strain: None  . Food insecurity - worry: None  . Food insecurity - inability: None  . Transportation needs - medical: None  . Transportation needs - non-medical: None  Occupational History  . None  Tobacco Use  . Smoking status: Former Research scientist (life sciences)  . Smokeless tobacco: Never Used  Substance and Sexual Activity  . Alcohol use: No    Alcohol/week: 0.0 oz  . Drug use: No  . Sexual activity: Not Currently  Other Topics Concern  . None  Social History Narrative  . None     Family History:  The patient's family history includes Cancer in her cousin, mother, and sister; Heart disease in her brother, father, and sister; Hypertension in her sister; Stroke in her mother and sister.   ROS:   Please see the history of present illness.    ROS All other systems reviewed and are negative.   PHYSICAL EXAM:   VS:  BP 132/70   Pulse 63   Ht 5\' 2"  (1.575 m)   Wt 157 lb (71.2 kg)   BMI 28.72 kg/m     General: Alert, oriented x3, no distress, mildly overweight Head: no evidence of trauma, PERRL, EOMI, no exophtalmos or lid lag, no myxedema, no xanthelasma; normal ears, nose and oropharynx Neck: normal jugular venous pulsations and no hepatojugular reflux; brisk carotid pulses without delay and no carotid bruits Chest: clear to auscultation, no signs of consolidation by percussion or palpation, normal fremitus, symmetrical and full respiratory excursions. Healthy loop recorder site Cardiovascular: normal position and quality of the apical impulse, regular rhythm, normal first and second heart sounds, no murmurs, rubs or gallops Abdomen: no tenderness or distention, no masses by palpation, no abnormal pulsatility or arterial bruits, normal bowel sounds, no  hepatosplenomegaly Extremities: no clubbing, cyanosis or edema; 2+ radial, ulnar and brachial pulses bilaterally; 2+ right femoral, posterior tibial and dorsalis pedis pulses; 2+ left femoral, posterior tibial and dorsalis pedis pulses; no subclavian or femoral bruits Neurological: grossly nonfocal Psych: Normal mood and affect   Wt Readings from Last 3 Encounters:  08/24/17 157 lb (71.2 kg)  06/04/17 155 lb 4 oz (70.4 kg)  12/03/16 154 lb 4 oz (70 kg)      Studies/Labs Reviewed:   EKG:  EKG is ordered today. Shows normal sinus rhythm and is a normal tracing.  Recent Labs: 06/10/2017: ALT 16; BUN 20; Creatinine, Ser 1.05; Hemoglobin 12.9; Platelets 205.0; Potassium 4.1; Sodium 140 07/16/2017: TSH 0.91   Lipid Panel    Component Value Date/Time   CHOL 130 06/10/2017 1005   TRIG 87.0 06/10/2017 1005   HDL 48.30 06/10/2017 1005   CHOLHDL 3 06/10/2017 1005   VLDL 17.4  06/10/2017 1005   LDLCALC 64 06/10/2017 1005      ASSESSMENT:    1. Coronary artery disease involving native coronary artery of native heart without angina pectoris   2. Paroxysmal atrial fibrillation (HCC)   3. Long term current use of anticoagulant   4. Essential hypertension   5. Encounter for loop recorder check      PLAN:  In order of problems listed above:  1. CAD: Stable angina pectoris is well controlled with metoprolol monotherapy. She has a known high-grade stenosis in the distal right coronary artery distribution, but due to the tortuosity of the vessel this is felt best left for medical therapy. 2. PAF: Atrial fibrillation is infrequent, but occasionally recorded by her loop recorder. It is asymptomatic. CHADSVasc 6 (age 73, gender, CAD, CVA 2). Continue anticoagulation. Tiredness are not justified. 3. Eliquis: Anticoagulation is well tolerated without bleeding complications. 4. HTN: Well controlled 5. Loop recorder: Device is functioning normally. We'll continue monthly downloads to evaluate  the overall prevalence of atrial fibrillation, as long as the battery lasts.    Medication Adjustments/Labs and Tests Ordered: Current medicines are reviewed at length with the patient today.  Concerns regarding medicines are outlined above.  Medication changes, Labs and Tests ordered today are listed in the Patient Instructions below. Patient Instructions  Dr Sallyanne Kuster recommends that you schedule a follow-up appointment in 12 months. You will receive a reminder letter in the mail two months in advance. If you don't receive a letter, please call our office to schedule the follow-up appointment.  If you need a refill on your cardiac medications before your next appointment, please call your pharmacy.    Signed, Sanda Klein, MD  08/24/2017 6:19 PM    [facility] [place], [place], [place]  [address] Phone: [phone]; Fax: [phone]

## 2017-08-25 ENCOUNTER — Other Ambulatory Visit: Payer: Self-pay | Admitting: Family Medicine

## 2017-08-26 LAB — CUP PACEART REMOTE DEVICE CHECK
Implantable Pulse Generator Implant Date: 20170207
MDC IDC SESS DTM: 20181230110718

## 2017-08-31 ENCOUNTER — Other Ambulatory Visit: Payer: Self-pay

## 2017-08-31 MED ORDER — METOPROLOL SUCCINATE ER 50 MG PO TB24: 50.0000 mg | ORAL_TABLET | Freq: Every day | ORAL | 3 refills | Status: DC

## 2017-08-31 NOTE — Telephone Encounter (Signed)
Rx(s) sent to pharmacy electronically.  

## 2017-09-02 ENCOUNTER — Telehealth: Payer: Self-pay

## 2017-09-02 NOTE — Telephone Encounter (Signed)
Patient declines AWV.  

## 2017-09-10 ENCOUNTER — Encounter: Payer: Medicare Other | Admitting: Cardiovascular Disease

## 2017-09-14 ENCOUNTER — Ambulatory Visit (INDEPENDENT_AMBULATORY_CARE_PROVIDER_SITE_OTHER): Payer: Medicare Other | Admitting: *Deleted

## 2017-09-14 DIAGNOSIS — I639 Cerebral infarction, unspecified: Secondary | ICD-10-CM

## 2017-09-14 NOTE — Progress Notes (Signed)
Carelink Summary Report / Loop Recorder 

## 2017-09-17 ENCOUNTER — Other Ambulatory Visit: Payer: Self-pay | Admitting: Family Medicine

## 2017-09-17 NOTE — Telephone Encounter (Signed)
Last OV 06/04/17 Alprazolam last filled 09/17/17 #30 with 0

## 2017-09-17 NOTE — Telephone Encounter (Signed)
Medication filled to pharmacy as requested.   

## 2017-09-17 NOTE — Telephone Encounter (Signed)
Last OV 06/04/17 Alprazolam last filled 07/16/17 #30 with 0

## 2017-09-20 ENCOUNTER — Other Ambulatory Visit: Payer: Self-pay | Admitting: Family Medicine

## 2017-09-23 ENCOUNTER — Other Ambulatory Visit: Payer: Self-pay | Admitting: Cardiovascular Disease

## 2017-09-28 LAB — CUP PACEART REMOTE DEVICE CHECK
Implantable Pulse Generator Implant Date: 20170207
MDC IDC SESS DTM: 20190129124012

## 2017-10-02 ENCOUNTER — Other Ambulatory Visit: Payer: Self-pay | Admitting: Family Medicine

## 2017-10-02 DIAGNOSIS — I10 Essential (primary) hypertension: Secondary | ICD-10-CM

## 2017-10-08 ENCOUNTER — Ambulatory Visit: Payer: Self-pay | Admitting: *Deleted

## 2017-10-08 NOTE — Telephone Encounter (Signed)
Pt called stating that she has the symptoms of having a UTI. She does not have a fever, or pain with urination but she does have pressure in and discomfort in her supra pubic area. She does not want an appointment. She wants to know if she can come by to pick up a cup for a urine specimen.  Home care advice given to patient with verbal understanding.  Reason for Disposition . Urinating more frequently than usual (i.e., frequency) . Age > 50 years  Answer Assessment - Initial Assessment Questions 1. SYMPTOM: "What's the main symptom you're concerned about?" (e.g., frequency, incontinence)     frequency 2. ONSET: "When did the  ________  start?"     About or more 3. PAIN: "Is there any pain?" If so, ask: "How bad is it?" (Scale: 1-10; mild, moderate, severe)     no 4. CAUSE: "What do you think is causing the symptoms?"     uti 5. OTHER SYMPTOMS: "Do you have any other symptoms?" (e.g., fever, flank pain, blood in urine, pain with urination)     Pain and pressure in supra pubic area 6. PREGNANCY: "Is there any chance you are pregnant?" "When was your last menstrual period?"     no  Protocols used: URINARY SYMPTOMS-A-AH, URINATION PAIN - FEMALE-A-AH

## 2017-10-08 NOTE — Telephone Encounter (Signed)
Suprapubic pressure in a 77 yr old does not necessarily mean UTI.  She would need to be seen for an appt and urine.

## 2017-10-08 NOTE — Telephone Encounter (Signed)
Patient would not make an appointment. Offered appointment with Dr. Jonni Sanger since PCP did not have any openings.  Patient declined.  She said that she would call back Monday if she is not any better. Advised patient that she will need to be seen  - stated verbal understanding.

## 2017-10-12 ENCOUNTER — Encounter: Payer: Self-pay | Admitting: Family Medicine

## 2017-10-12 ENCOUNTER — Ambulatory Visit (INDEPENDENT_AMBULATORY_CARE_PROVIDER_SITE_OTHER): Payer: Medicare Other | Admitting: Family Medicine

## 2017-10-12 ENCOUNTER — Other Ambulatory Visit: Payer: Self-pay

## 2017-10-12 VITALS — BP 110/66 | HR 62 | Temp 97.8°F | Resp 16 | Ht 62.0 in | Wt 158.2 lb

## 2017-10-12 DIAGNOSIS — N3000 Acute cystitis without hematuria: Secondary | ICD-10-CM

## 2017-10-12 DIAGNOSIS — R300 Dysuria: Secondary | ICD-10-CM

## 2017-10-12 LAB — POCT URINALYSIS DIPSTICK
BILIRUBIN UA: NEGATIVE
GLUCOSE UA: NEGATIVE
Ketones, UA: NEGATIVE
Nitrite, UA: POSITIVE
RBC UA: NEGATIVE
Spec Grav, UA: 1.025 (ref 1.010–1.025)
Urobilinogen, UA: 0.2 E.U./dL
pH, UA: 5.5 (ref 5.0–8.0)

## 2017-10-12 MED ORDER — CEPHALEXIN 500 MG PO CAPS: 500.0000 mg | ORAL_CAPSULE | Freq: Two times a day (BID) | ORAL | 0 refills | Status: DC

## 2017-10-12 NOTE — Progress Notes (Signed)
Subjective   CC:  Chief Complaint  Patient presents with  . Back Pain    Kidney pain, hurts/burns when she urinates and pressure in lower pelvic    HPI: Colleen Wright is a 77 y.o. female who presents to the office today to address the problems listed above in the chief complaint.  Patient reports urinary frequency with hesitancy and occasional and avoid dysuria.  She has sensation of increased urinary pressure.  She denies fevers flank pain nausea vomiting or gross hematuria.  Symptoms have been present for several hours to days.  Her last UTI was in October and was E. coli sensitive to Keflex.  She denies history of interstitial cystitis.  She denies vaginal symptoms including vaginal discharge or pelvic pain.   I reviewed the patients updated PMH, FH, and SocHx.    Patient Active Problem List   Diagnosis Date Noted  . Carotid artery stenosis 12/03/2016  . Dysuria 12/03/2016  . Other fatigue 05/06/2016  . BMI 28.0-28.9,adult 05/06/2016  . Chest pain with high risk for cardiac etiology 01/24/2016  . Paroxysmal atrial fibrillation ([place]) 01/08/2016  . History of colonic polyps 12/12/2015  . Cryptogenic stroke ([place]) 09/13/2015  . Insomnia 08/10/2015  . TIA (transient ischemic attack) 08/09/2015  . Vitamin D deficiency 06/21/2015  . B12 deficiency 09/25/2013  . Cerebrovascular disease 06/13/2011  . HTN (hypertension) 06/13/2011  . HIstory of thyroid cancer   . Hyperlipidemia   . Postsurgical hypothyroidism   . CAD (coronary artery disease)    Current Meds  Medication Sig  . ALPRAZolam (XANAX) 0.5 MG tablet TAKE 1 TABLET BY MOUTH EVERY NIGHT AT BEDTIME AS NEEDED FOR ANXIETY  . atorvastatin (LIPITOR) 40 MG tablet TAKE 1 TABLET BY MOUTH EVERY DAY AT 6 PM  . ELIQUIS 5 MG TABS tablet TAKE 1 TABLET(5 MG) BY MOUTH TWICE DAILY  . levothyroxine (SYNTHROID, LEVOTHROID) 112 MCG tablet TAKE 1 TABLET(112 MCG) BY MOUTH DAILY  . losartan-hydrochlorothiazide (HYZAAR) 50-12.5 MG tablet TAKE  1 TABLET BY MOUTH DAILY  . metoprolol succinate (TOPROL-XL) 50 MG 24 hr tablet Take 1 tablet (50 mg total) by mouth daily. Take with or immediately following a meal.  . nitroGLYCERIN (NITROSTAT) 0.4 MG SL tablet Place 0.4 mg under the tongue every 5 (five) minutes as needed for chest pain.    Review of Systems: Cardiovascular: negative for chest pain Respiratory: negative for SOB or persistent cough Gastrointestinal: negative for abdominal pain Constitutional: Negative for fever malaise or anorexia  Objective  Vitals: BP 110/66   Pulse 62   Temp 97.8 F (36.6 C) (Oral)   Resp 16   Ht 5\' 2"  (1.575 m)   Wt 158 lb 3.2 oz (71.8 kg)   SpO2 99%   BMI 28.94 kg/m  General: no acute distress  Psych:  Alert and oriented, normal mood and affect Cardiovascular:  RRR without murmur or gallop. no peripheral edema Respiratory:  Good breath sounds bilaterally, CTAB with normal respiratory effort Gastrointestinal: soft, flat abdomen, normal active bowel sounds, no palpable masses, no hepatosplenomegaly, no appreciated hernias, NO CVAT, mild suprapubic ttp w/o rebound or guarding Skin:  Warm, no rashes Neurologic:   Mental status is normal. normal gait Office Visit on 10/12/2017  Component Date Value Ref Range Status  . Color, UA 10/12/2017 yellow   Final  . Clarity, UA 10/12/2017 cloudy   Final  . Glucose, UA 10/12/2017 negative   Final  . Bilirubin, UA 10/12/2017 negative   Final  . Ketones, UA  10/12/2017 negative   Final  . Spec Grav, UA 10/12/2017 1.025  1.010 - 1.025 Final  . Blood, UA 10/12/2017 negative   Final  . pH, UA 10/12/2017 5.5  5.0 - 8.0 Final  . Protein, UA 10/12/2017 Trace   Final  . Urobilinogen, UA 10/12/2017 0.2  0.2 or 1.0 E.U./dL Final  . Nitrite, UA 10/12/2017 Positive   Final  . Leukocytes, UA 10/12/2017 Small (1+)* Negative Final    Assessment  1. Dysuria      Plan   Acute cystitis: Supportive care and Keflex ordered.  Await urine culture.  Follow up: prn      Commons side effects, risks, benefits, and alternatives for medications and treatment plan prescribed today were discussed, and the patient expressed understanding of the given instructions. Patient is instructed to call or message via MyChart if he/she has any questions or concerns regarding our treatment plan. No barriers to understanding were identified. We discussed Red Flag symptoms and signs in detail. Patient expressed understanding regarding what to do in case of urgent or emergency type symptoms.   Medication list was reconciled, printed and provided to the patient in AVS. Patient instructions and summary information was reviewed with the patient as documented in the AVS. This note was prepared with assistance of Dragon voice recognition software. Occasional wrong-word or sound-a-like substitutions may have occurred due to the inherent limitations of voice recognition software  Orders Placed This Encounter  Procedures  . POCT urinalysis dipstick   No orders of the defined types were placed in this encounter.

## 2017-10-12 NOTE — Patient Instructions (Signed)
Please follow up if symptoms do not improve or as needed.    Urinary Tract Infection, Adult A urinary tract infection (UTI) is an infection of any part of the urinary tract, which includes the kidneys, ureters, bladder, and urethra. These organs make, store, and get rid of urine in the body. UTI can be a bladder infection (cystitis) or kidney infection (pyelonephritis). What are the causes? This infection may be caused by fungi, viruses, or bacteria. Bacteria are the most common cause of UTIs. This condition can also be caused by repeated incomplete emptying of the bladder during urination. What increases the risk? This condition is more likely to develop if:  You ignore your need to urinate or hold urine for long periods of time.  You do not empty your bladder completely during urination.  You wipe back to front after urinating or having a bowel movement, if you are female.  You are uncircumcised, if you are female.  You are constipated.  You have a urinary catheter that stays in place (indwelling).  You have a weak defense (immune) system.  You have a medical condition that affects your bowels, kidneys, or bladder.  You have diabetes.  You take antibiotic medicines frequently or for long periods of time, and the antibiotics no longer work well against certain types of infections (antibiotic resistance).  You take medicines that irritate your urinary tract.  You are exposed to chemicals that irritate your urinary tract.  You are female.  What are the signs or symptoms? Symptoms of this condition include:  Fever.  Frequent urination or passing small amounts of urine frequently.  Needing to urinate urgently.  Pain or burning with urination.  Urine that smells bad or unusual.  Cloudy urine.  Pain in the lower abdomen or back.  Trouble urinating.  Blood in the urine.  Vomiting or being less hungry than normal.  Diarrhea or abdominal pain.  Vaginal discharge, if  you are female.  How is this diagnosed? This condition is diagnosed with a medical history and physical exam. You will also need to provide a urine sample to test your urine. Other tests may be done, including:  Blood tests.  Sexually transmitted disease (STD) testing.  If you have had more than one UTI, a cystoscopy or imaging studies may be done to determine the cause of the infections. How is this treated? Treatment for this condition often includes a combination of two or more of the following:  Antibiotic medicine.  Other medicines to treat less common causes of UTI.  Over-the-counter medicines to treat pain.  Drinking enough water to stay hydrated.  Follow these instructions at home:  Take over-the-counter and prescription medicines only as told by your health care provider.  If you were prescribed an antibiotic, take it as told by your health care provider. Do not stop taking the antibiotic even if you start to feel better.  Avoid alcohol, caffeine, tea, and carbonated beverages. They can irritate your bladder.  Drink enough fluid to keep your urine clear or pale yellow.  Keep all follow-up visits as told by your health care provider. This is important.  Make sure to: ? Empty your bladder often and completely. Do not hold urine for long periods of time. ? Empty your bladder before and after sex. ? Wipe from front to back after a bowel movement if you are female. Use each tissue one time when you wipe. Contact a health care provider if:  You have back pain.  You   have a fever.  You feel nauseous or vomit.  Your symptoms do not get better after 3 days.  Your symptoms go away and then return. Get help right away if:  You have severe back pain or lower abdominal pain.  You are vomiting and cannot keep down any medicines or water. This information is not intended to replace advice given to you by your health care provider. Make sure you discuss any questions you  have with your health care provider. Document Released: 05/13/2005 Document Revised: 01/15/2016 Document Reviewed: 06/24/2015 Elsevier Interactive Patient Education  2018 Elsevier Inc.  

## 2017-10-13 ENCOUNTER — Other Ambulatory Visit: Payer: Self-pay | Admitting: Family Medicine

## 2017-10-15 LAB — URINE CULTURE
MICRO NUMBER:: 90250894
SPECIMEN QUALITY:: ADEQUATE

## 2017-10-15 NOTE — Progress Notes (Signed)
Positive E. coli UTI sensitive to Keflex

## 2017-10-16 ENCOUNTER — Other Ambulatory Visit: Payer: Self-pay | Admitting: Family Medicine

## 2017-10-18 ENCOUNTER — Ambulatory Visit (INDEPENDENT_AMBULATORY_CARE_PROVIDER_SITE_OTHER): Payer: Medicare Other | Admitting: *Deleted

## 2017-10-18 DIAGNOSIS — I639 Cerebral infarction, unspecified: Secondary | ICD-10-CM

## 2017-10-18 NOTE — Telephone Encounter (Signed)
Last OV 10/12/17 Alprazolam last filled 09/17/17 #30 with 0

## 2017-10-18 NOTE — Progress Notes (Signed)
Carelink Summary Report / Loop Recorder 

## 2017-11-09 ENCOUNTER — Other Ambulatory Visit: Payer: Self-pay | Admitting: Family Medicine

## 2017-11-19 ENCOUNTER — Ambulatory Visit (INDEPENDENT_AMBULATORY_CARE_PROVIDER_SITE_OTHER): Payer: Medicare Other | Admitting: *Deleted

## 2017-11-19 DIAGNOSIS — I639 Cerebral infarction, unspecified: Secondary | ICD-10-CM

## 2017-11-22 NOTE — Progress Notes (Signed)
Carelink Summary Report / Loop Recorder 

## 2017-11-26 LAB — CUP PACEART REMOTE DEVICE CHECK
Implantable Pulse Generator Implant Date: 20170207
MDC IDC SESS DTM: 20190303124005

## 2017-12-21 ENCOUNTER — Other Ambulatory Visit: Payer: Self-pay | Admitting: Family Medicine

## 2017-12-22 ENCOUNTER — Ambulatory Visit (INDEPENDENT_AMBULATORY_CARE_PROVIDER_SITE_OTHER): Payer: Medicare Other | Admitting: *Deleted

## 2017-12-22 DIAGNOSIS — I639 Cerebral infarction, unspecified: Secondary | ICD-10-CM

## 2017-12-22 LAB — CUP PACEART REMOTE DEVICE CHECK
Date Time Interrogation Session: 20190405123642
MDC IDC PG IMPLANT DT: 20170207

## 2017-12-22 NOTE — Progress Notes (Signed)
Carelink Summary Report / Loop Recorder 

## 2018-01-17 LAB — CUP PACEART REMOTE DEVICE CHECK
Implantable Pulse Generator Implant Date: 20170207
MDC IDC SESS DTM: 20190508133628

## 2018-01-24 ENCOUNTER — Ambulatory Visit (INDEPENDENT_AMBULATORY_CARE_PROVIDER_SITE_OTHER): Payer: Medicare Other | Admitting: *Deleted

## 2018-01-24 DIAGNOSIS — I639 Cerebral infarction, unspecified: Secondary | ICD-10-CM

## 2018-01-24 NOTE — Progress Notes (Signed)
Carelink Summary Report / Loop Recorder 

## 2018-01-27 DIAGNOSIS — L821 Other seborrheic keratosis: Secondary | ICD-10-CM | POA: Diagnosis not present

## 2018-01-27 DIAGNOSIS — L814 Other melanin hyperpigmentation: Secondary | ICD-10-CM | POA: Diagnosis not present

## 2018-01-27 DIAGNOSIS — C44622 Squamous cell carcinoma of skin of right upper limb, including shoulder: Secondary | ICD-10-CM | POA: Diagnosis not present

## 2018-01-27 DIAGNOSIS — D485 Neoplasm of uncertain behavior of skin: Secondary | ICD-10-CM | POA: Diagnosis not present

## 2018-01-27 DIAGNOSIS — Z85828 Personal history of other malignant neoplasm of skin: Secondary | ICD-10-CM | POA: Diagnosis not present

## 2018-01-27 DIAGNOSIS — L819 Disorder of pigmentation, unspecified: Secondary | ICD-10-CM | POA: Diagnosis not present

## 2018-01-27 DIAGNOSIS — L570 Actinic keratosis: Secondary | ICD-10-CM | POA: Diagnosis not present

## 2018-02-07 NOTE — Progress Notes (Signed)
Spoke to pt, she was not aware of the orders. Gave her the number to call and schedule with [facility]. Faxed the orders to the office as well .

## 2018-02-07 NOTE — Progress Notes (Signed)
Spoke to Pt, states that she was not aware of the orders. Gave her the number to call [facility] to schedule. Faxed orders to office as well.

## 2018-02-07 NOTE — Progress Notes (Signed)
Spoke to the patient. She was not aware of the orders. [facility] the [facility] and faxed the orders over to the office.

## 2018-02-08 ENCOUNTER — Other Ambulatory Visit: Payer: Self-pay | Admitting: Family Medicine

## 2018-02-08 DIAGNOSIS — I6522 Occlusion and stenosis of left carotid artery: Secondary | ICD-10-CM

## 2018-02-11 ENCOUNTER — Ambulatory Visit (HOSPITAL_COMMUNITY)
Admission: RE | Admit: 2018-02-11 | Discharge: 2018-02-11 | Disposition: A | Discharge status: Home / Self Care | Payer: Medicare Other | Source: Ambulatory Visit | Attending: Cardiovascular Disease | Admitting: Cardiovascular Disease

## 2018-02-11 DIAGNOSIS — I6523 Occlusion and stenosis of bilateral carotid arteries: Secondary | ICD-10-CM | POA: Insufficient documentation

## 2018-02-11 DIAGNOSIS — I6522 Occlusion and stenosis of left carotid artery: Secondary | ICD-10-CM

## 2018-02-14 ENCOUNTER — Telehealth: Payer: Self-pay | Admitting: Family Medicine

## 2018-02-14 NOTE — Telephone Encounter (Signed)
Copied from [place] [id number]. Topic: Quick Communication - Lab Results >> Feb 14, 2018 11:19 AM Davis Gourd, CMA wrote: R carotid shows 1-39% stenosis and L carotid shows 40-59% stenosis.  No need for intervention at this time.  Repeat ultrasound in 1 yr   [place] for [facility] to Discuss results / PCP recommendations / Schedule patient.

## 2018-02-14 NOTE — Telephone Encounter (Signed)
Pt given results per notes of Dr. Birdie Riddle on 02/13/18. Pt verbalized understanding. Unable to document in result notes.

## 2018-02-14 NOTE — Telephone Encounter (Signed)
Patient returning call.

## 2018-02-19 ENCOUNTER — Other Ambulatory Visit: Payer: Self-pay | Admitting: Family Medicine

## 2018-02-21 NOTE — Telephone Encounter (Signed)
Last visit: 05/2017 Last Fill: 10/18/2017

## 2018-02-24 ENCOUNTER — Encounter: Payer: Self-pay | Admitting: General Practice

## 2018-02-28 ENCOUNTER — Ambulatory Visit (INDEPENDENT_AMBULATORY_CARE_PROVIDER_SITE_OTHER): Payer: Medicare Other | Admitting: *Deleted

## 2018-02-28 DIAGNOSIS — I639 Cerebral infarction, unspecified: Secondary | ICD-10-CM | POA: Diagnosis not present

## 2018-02-28 NOTE — Progress Notes (Signed)
Carelink Summary Report / Loop Recorder 

## 2018-03-01 LAB — CUP PACEART REMOTE DEVICE CHECK
Date Time Interrogation Session: 20190610161054
Implantable Pulse Generator Implant Date: 20170207

## 2018-03-23 ENCOUNTER — Other Ambulatory Visit: Payer: Self-pay | Admitting: General Practice

## 2018-03-23 ENCOUNTER — Other Ambulatory Visit: Payer: Self-pay | Admitting: Family Medicine

## 2018-03-23 DIAGNOSIS — I10 Essential (primary) hypertension: Secondary | ICD-10-CM

## 2018-03-23 MED ORDER — LOSARTAN POTASSIUM-HCTZ 50-12.5 MG PO TABS: 1.0000 | ORAL_TABLET | Freq: Every day | ORAL | 0 refills | Status: DC

## 2018-03-31 ENCOUNTER — Ambulatory Visit (INDEPENDENT_AMBULATORY_CARE_PROVIDER_SITE_OTHER): Payer: Medicare Other | Admitting: *Deleted

## 2018-03-31 DIAGNOSIS — I639 Cerebral infarction, unspecified: Secondary | ICD-10-CM | POA: Diagnosis not present

## 2018-03-31 NOTE — Progress Notes (Signed)
Carelink Summary Report / Loop Recorder 

## 2018-04-02 ENCOUNTER — Other Ambulatory Visit: Payer: Self-pay | Admitting: Family Medicine

## 2018-04-03 ENCOUNTER — Emergency Department (HOSPITAL_BASED_OUTPATIENT_CLINIC_OR_DEPARTMENT_OTHER)
Admission: EM | Admit: 2018-04-03 | Discharge: 2018-04-03 | Disposition: A | Discharge status: Home / Self Care | Payer: Medicare Other | Attending: Emergency Medicine | Admitting: Emergency Medicine

## 2018-04-03 ENCOUNTER — Other Ambulatory Visit: Payer: Self-pay

## 2018-04-03 ENCOUNTER — Emergency Department (HOSPITAL_BASED_OUTPATIENT_CLINIC_OR_DEPARTMENT_OTHER): Payer: Medicare Other

## 2018-04-03 ENCOUNTER — Encounter (HOSPITAL_BASED_OUTPATIENT_CLINIC_OR_DEPARTMENT_OTHER): Payer: Self-pay | Admitting: Emergency Medicine

## 2018-04-03 DIAGNOSIS — G8929 Other chronic pain: Secondary | ICD-10-CM | POA: Insufficient documentation

## 2018-04-03 DIAGNOSIS — I10 Essential (primary) hypertension: Secondary | ICD-10-CM | POA: Diagnosis not present

## 2018-04-03 DIAGNOSIS — M549 Dorsalgia, unspecified: Secondary | ICD-10-CM | POA: Diagnosis not present

## 2018-04-03 DIAGNOSIS — Z8673 Personal history of transient ischemic attack (TIA), and cerebral infarction without residual deficits: Secondary | ICD-10-CM | POA: Diagnosis not present

## 2018-04-03 DIAGNOSIS — R072 Precordial pain: Secondary | ICD-10-CM | POA: Diagnosis not present

## 2018-04-03 DIAGNOSIS — E039 Hypothyroidism, unspecified: Secondary | ICD-10-CM | POA: Insufficient documentation

## 2018-04-03 DIAGNOSIS — Z85850 Personal history of malignant neoplasm of thyroid: Secondary | ICD-10-CM | POA: Insufficient documentation

## 2018-04-03 DIAGNOSIS — Z87891 Personal history of nicotine dependence: Secondary | ICD-10-CM | POA: Diagnosis not present

## 2018-04-03 DIAGNOSIS — I2510 Atherosclerotic heart disease of native coronary artery without angina pectoris: Secondary | ICD-10-CM | POA: Diagnosis not present

## 2018-04-03 DIAGNOSIS — Z7901 Long term (current) use of anticoagulants: Secondary | ICD-10-CM | POA: Diagnosis not present

## 2018-04-03 DIAGNOSIS — Z79899 Other long term (current) drug therapy: Secondary | ICD-10-CM | POA: Insufficient documentation

## 2018-04-03 DIAGNOSIS — R079 Chest pain, unspecified: Secondary | ICD-10-CM | POA: Diagnosis not present

## 2018-04-03 DIAGNOSIS — M545 Low back pain: Secondary | ICD-10-CM | POA: Insufficient documentation

## 2018-04-03 DIAGNOSIS — R531 Weakness: Secondary | ICD-10-CM | POA: Diagnosis not present

## 2018-04-03 LAB — COMPREHENSIVE METABOLIC PANEL
ALBUMIN: 3.9 g/dL (ref 3.5–5.0)
ALT: 23 U/L (ref 0–44)
ANION GAP: 9 (ref 5–15)
AST: 30 U/L (ref 15–41)
Alkaline Phosphatase: 77 U/L (ref 38–126)
BILIRUBIN TOTAL: 0.5 mg/dL (ref 0.3–1.2)
BUN: 32 mg/dL — AB (ref 8–23)
CALCIUM: 9 mg/dL (ref 8.9–10.3)
CO2: 25 mmol/L (ref 22–32)
Chloride: 104 mmol/L (ref 98–111)
Creatinine, Ser: 1.19 mg/dL — ABNORMAL HIGH (ref 0.44–1.00)
GFR calc Af Amer: 49 mL/min — ABNORMAL LOW (ref >60)
GFR, EST NON AFRICAN AMERICAN: 42 mL/min — AB (ref >60)
GLUCOSE: 101 mg/dL — AB (ref 70–99)
POTASSIUM: 3.7 mmol/L (ref 3.5–5.1)
Sodium: 138 mmol/L (ref 135–145)
TOTAL PROTEIN: 7.3 g/dL (ref 6.5–8.1)

## 2018-04-03 LAB — CBC WITH DIFFERENTIAL/PLATELET
BASOS PCT: 0 %
Basophils Absolute: 0 10*3/uL (ref 0.0–0.1)
EOS ABS: 0.2 10*3/uL (ref 0.0–0.7)
EOS PCT: 3 %
HCT: 34.4 % — ABNORMAL LOW (ref 36.0–46.0)
Hemoglobin: 11.9 g/dL — ABNORMAL LOW (ref 12.0–15.0)
LYMPHS ABS: 2.1 10*3/uL (ref 0.7–4.0)
Lymphocytes Relative: 32 %
MCH: 30.4 pg (ref 26.0–34.0)
MCHC: 34.6 g/dL (ref 30.0–36.0)
MCV: 88 fL (ref 78.0–100.0)
MONOS PCT: 10 %
Monocytes Absolute: 0.6 10*3/uL (ref 0.1–1.0)
NEUTROS PCT: 55 %
Neutro Abs: 3.6 10*3/uL (ref 1.7–7.7)
PLATELETS: 177 10*3/uL (ref 150–400)
RBC: 3.91 MIL/uL (ref 3.87–5.11)
RDW: 13 % (ref 11.5–15.5)
WBC: 6.5 10*3/uL (ref 4.0–10.5)

## 2018-04-03 LAB — URINALYSIS, ROUTINE W REFLEX MICROSCOPIC
BILIRUBIN URINE: NEGATIVE
Glucose, UA: NEGATIVE mg/dL
HGB URINE DIPSTICK: NEGATIVE
KETONES UR: NEGATIVE mg/dL
NITRITE: NEGATIVE
PROTEIN: NEGATIVE mg/dL
Specific Gravity, Urine: <1.005 — ABNORMAL LOW (ref 1.005–1.030)
pH: 5.5 (ref 5.0–8.0)

## 2018-04-03 LAB — URINALYSIS, MICROSCOPIC (REFLEX): RBC / HPF: NONE SEEN RBC/hpf (ref 0–5)

## 2018-04-03 LAB — LIPASE, BLOOD: Lipase: 44 U/L (ref 11–51)

## 2018-04-03 LAB — TROPONIN I: Troponin I: <0.03 ng/mL (ref <0.03)

## 2018-04-03 NOTE — ED Notes (Signed)
Patient transported to X-ray and returned 

## 2018-04-03 NOTE — ED Provider Notes (Signed)
[facility] EM[facility]GENCY DEPARTMENT Provider Note   CSN: [id number] Arrival date & time: 04/03/18  1623     History   Chief Complaint Chief Complaint  Patient presents with  . Weakness    HPI Colleen Wright is a 77 y.o. female.  HPI Patient reports that she had gone to church and then she was out to eat with her friend.  She reports she has some fish and onion rings and [place] fries for lunch.  Just after she finished eating, she suddenly started to feel a pain in the lower and center of her chest and she became feeling weak and nauseated.  That feeling has resolved.  Do not have any loss of consciousness.  No shortness of breath.  No lower extremity swelling or calf pain.  She reports that she was concerned because she does have history of stents and coronary artery disease.  She was hoping to go into the urgent care and just get her vital signs checked but she was advised to come to the emergency department for further evaluation.  She reports that what she would like to find out while she is here is why she has ongoing problems with low back pain.  She reports she gets central lower aching if she does activities like making her bed or standing and washing dishes.  Pain resolves if she rests and is not present when she is doing only light activity.  She denies any weakness or numbness in her legs.  Pain does not radiate into her legs.  She does not have abdominal pain nor burning urgency with urination.  This has been a long-standing problem.  She reports that it used to respond pretty well to meloxicam but she is not advised to take that anymore due to being on Eliquis and a daily aspirin.  Thus, she does not try anything for pain control.  She reports she does not want to take anything for pain but she is tired of having her usually active lifestyle limited by low back pain. Past Medical History:  Diagnosis Date  . Adenomatous colon polyp 2006  . Allergy   . Arthritis    low back    . Back pain 06/13/2011  . CAD (coronary artery disease) 06/12/2011  . Depression   . GERD (gastroesophageal reflux disease)   . Hiatal hernia   . Hyperlipidemia   . Hypertension   . Hypothyroidism 06/12/2011  . Leg fracture   . Pain in joint, ankle and foot 11/02/2012  . Pain of left heel 05/23/2012  . Skin lesions, generalized 05/23/2012  . Thyroid cancer ([place])   . Ulcer   . Vertigo 06/13/2011    Patient Active Problem List   Diagnosis Date Noted  . Carotid artery stenosis 12/03/2016  . Dysuria 12/03/2016  . Other fatigue 05/06/2016  . BMI 28.0-28.9,adult 05/06/2016  . Chest pain with high risk for cardiac etiology 01/24/2016  . Paroxysmal atrial fibrillation ([place]) 01/08/2016  . History of colonic polyps 12/12/2015  . Cryptogenic stroke ([place]) 09/13/2015  . Insomnia 08/10/2015  . TIA (transient ischemic attack) 08/09/2015  . Vitamin D deficiency 06/21/2015  . B12 deficiency 09/25/2013  . Cerebrovascular disease 06/13/2011  . HTN (hypertension) 06/13/2011  . HIstory of thyroid cancer   . Hyperlipidemia   . Postsurgical hypothyroidism   . CAD (coronary artery disease)     Past Surgical History:  Procedure Laterality Date  . BALLOON ANGIOPLASTY, ARTERY    . CARDIAC CATHETERIZATION N/A 01/24/2016  Procedure: Left Heart Cath and Coronary Angiography;  Surgeon: Peter M Martinique, MD;  Location: [facility];  Service: Cardiovascular;  Laterality: N/A;  . EP IMPLANTABLE DEVICE N/A 09/24/2015   Procedure: Loop Recorder Insertion;  Surgeon: Sanda Klein, MD;  Location: [facility];  Service: Cardiovascular;  Laterality: N/A;  . FRACTURE SURGERY     left leg, pin and 5 bolts in place in lower tibia  . LESION REMOVAL     vaginal  . THYROIDECTOMY, PARTIAL     twice  . TUBAL LIGATION     growth removed from posterior vaginal vault     OB History   None      Home Medications    Prior to Admission medications   Medication Sig Start Date End Date Taking?  Authorizing Provider  ALPRAZolam Duanne Moron) 0.5 MG tablet TAKE 1 TABLET BY MOUTH EVERY NIGHT AT BEDTIME AS NEEDED FOR ANXIETY 02/22/18  Yes Midge Minium, MD  atorvastatin (LIPITOR) 40 MG tablet TAKE 1 TABLET BY MOUTH DAILY AT 6 PM 12/21/17  Yes Midge Minium, MD  ELIQUIS 5 MG TABS tablet TAKE 1 TABLET(5 MG) BY MOUTH TWICE DAILY 09/23/17  Yes Croitoru, Mihai, MD  levothyroxine (SYNTHROID, LEVOTHROID) 112 MCG tablet TAKE 1 TABLET BY MOUTH EVERY DAY 10/13/17  Yes Midge Minium, MD  losartan-hydrochlorothiazide (HYZAAR) 50-12.5 MG tablet TAKE 1 TABLET BY MOUTH DAILY. FOLLOW USE 03/23/18  Yes Midge Minium, MD  metoprolol succinate (TOPROL-XL) 50 MG 24 hr tablet Take 1 tablet (50 mg total) by mouth daily. Take with or immediately following a meal. 08/31/17  Yes Croitoru, Mihai, MD  nitroGLYCERIN (NITROSTAT) 0.4 MG SL tablet Place 0.4 mg under the tongue every 5 (five) minutes as needed for chest pain.   Yes [provider]  cephALEXin (KEFLEX) 500 MG capsule Take 1 capsule (500 mg total) by mouth 2 (two) times daily. 10/12/17   Leamon Arnt, MD    Family History Family History  Problem Relation Age of Onset  . Cancer Mother        ovary/uterus-can't remember  . Stroke Mother   . Heart disease Father   . Heart disease Sister        bypass  . Stroke Sister   . Cancer Sister        LN, bone  . Hypertension Sister   . Heart disease Brother        bypass  . Cancer Cousin        breast    Social History Social History   Tobacco Use  . Smoking status: Former Research scientist (life sciences)  . Smokeless tobacco: Never Used  Substance Use Topics  . Alcohol use: No    Alcohol/week: 0.0 standard drinks  . Drug use: No     Allergies   Amoxicillin   Review of Systems Review of Systems 10 Systems reviewed and are negative for acute change except as noted in the HPI.   Physical Exam Updated Vital Signs BP 132/60   Pulse 66   Temp 98.2 F (36.8 C) (Oral)   Resp 17   Ht 5\' 2"   (1.575 m)   Wt 72.6 kg   SpO2 100%   BMI 29.26 kg/m   Physical Exam  Constitutional: She is oriented to person, place, and time. She appears well-developed and well-nourished.  HENT:  Head: Normocephalic and atraumatic.  Eyes: Pupils are equal, round, and reactive to light. EOM are normal.  Neck: Neck supple.  Cardiovascular:  Normal rate, regular rhythm, normal heart sounds and intact distal pulses.  Pulmonary/Chest: Effort normal and breath sounds normal.  Abdominal: Soft. Bowel sounds are normal. She exhibits no distension. There is no tenderness. There is no guarding.  Musculoskeletal: Normal range of motion. She exhibits no edema.  Patient does not have pain reproducible to the bony prominences of the back.  No CVA tenderness.  Normal range of motion bilateral lower extremities.  No peripheral edema or calf tenderness.  Pedal pulses are 2+ and symmetric.  Feet are warm and dry.  She is in very good physical condition without significant muscular atrophy.  Neurological: She is alert and oriented to person, place, and time. She has normal strength. She exhibits normal muscle tone. Coordination normal. GCS eye subscore is 4. GCS verbal subscore is 5. GCS motor subscore is 6.  Skin: Skin is warm, dry and intact.  Psychiatric: She has a normal mood and affect.     ED Treatments / Results  Labs (all labs ordered are listed, but only abnormal results are displayed) Labs Reviewed  COMPREHENSIVE METABOLIC PANEL - Abnormal; Notable for the following components:      Result Value   Glucose, Bld 101 (*)    BUN 32 (*)    Creatinine, Ser 1.19 (*)    GFR calc non Af Amer 42 (*)    GFR calc Af Amer 49 (*)    All other components within normal limits  CBC WITH DIFFERENTIAL/PLATELET - Abnormal; Notable for the following components:   Hemoglobin 11.9 (*)    HCT 34.4 (*)    All other components within normal limits  URINALYSIS, ROUTINE W REFLEX MICROSCOPIC - Abnormal; Notable for the following  components:   Specific Gravity, Urine <1.005 (*)    Leukocytes, UA TRACE (*)    All other components within normal limits  URINALYSIS, MICROSCOPIC (REFLEX) - Abnormal; Notable for the following components:   Bacteria, UA FEW (*)    All other components within normal limits  LIPASE, BLOOD  TROPONIN I    EKG EKG Interpretation  Date/Time:  Sunday April 03 2018 16:31:13 EDT Ventricular Rate:  72 PR Interval:  114 QRS Duration: 76 QT Interval:  376 QTC Calculation: 411 R Axis:   36 Text Interpretation:  Poor data quality, interpretation may be adversely affected Normal sinus rhythm Normal ECG normal. no change. need repeat for V3. Confirmed by Charlesetta Shanks [phone]) on 04/03/2018 5:46:21 PM   Radiology Dg Chest 2 View  Result Date: 04/03/2018 CLINICAL DATA:  Chest pain EXAM: CHEST - 2 VIEW COMPARISON:  12/28/2015 FINDINGS: Hyperinflation. No focal opacity or pleural effusion. Normal heart size. No pneumothorax. Degenerative changes of the spine. Aortic atherosclerosis. IMPRESSION: No active cardiopulmonary disease. Electronically Signed   By: Donavan Foil M.D.   On: 04/03/2018 19:00   Dg Lumbar Spine 2-3 Views  Result Date: 04/03/2018 CLINICAL DATA:  Chest pain weakness back pain EXAM: LUMBAR SPINE - 2-3 VIEW COMPARISON:  MRI 12/13/2014, radiograph 12/17/2010 FINDINGS: Mild scoliosis of the upper lumbar spine. SI joints are patent. Dense aortic atherosclerosis. Vertebral body heights appear within normal limits. Moderate degenerative changes at L1-L2, L2-L3 and L3-L4. IMPRESSION: Scoliosis and moderate degenerative changes of the spine. No acute osseous abnormality. Electronically Signed   By: Donavan Foil M.D.   On: 04/03/2018 19:01    Procedures Procedures (including critical care time)  Medications Ordered in ED Medications - No data to display   Initial Impression / Assessment and Plan / ED  Course  I have reviewed the triage vital signs and the nursing notes.  Pertinent  labs & imaging results that were available during my care of the patient were reviewed by me and considered in my medical decision making (see chart for details).      Final Clinical Impressions(s) / ED Diagnoses   Final diagnoses:  Generalized weakness  Chronic midline low back pain without sciatica  Precordial pain   Patient had episode of chest pain with nausea and diaphoresis after eating lunch.  Had eaten a fairly high fat meal with [place] fries and onion rings.  Suspect reflux more so than acute ischemia.  Patient does have known coronary artery disease but describes being active without getting chest pain or dyspnea.  As an aside, she was concerned about low back pain which has been present for quite some time.  There is no sign of neurologic impingement syndrome.  No associated abdominal pain or urinary symptoms.  Pain is provoked by activity such as making her bed or standing doing dishes.  X-rays do confirm significant amount of osteoarthritis.  She does not wish to take any narcotic pain medications.  She is counseled to take Tylenol as needed.  Due to being on Eliquis and mild renal insufficiency she is not a candidate for NSAIDs which she reports that worked for her well in the past.  Patient is counseled on close follow-up with her cardiologist and PCP. ED Discharge Orders    None       Charlesetta Shanks, MD 04/03/18 [phone]

## 2018-04-03 NOTE — Discharge Instructions (Signed)
1.  Take extra strength Tylenol for back pain. 2.  Follow-up with your cardiologist this week. 3.  Follow-up with your family doctor as soon as possible.

## 2018-04-03 NOTE — ED Triage Notes (Signed)
Generalized weakness and nausea since eating lunch today. Endorses some chest pain this morning.

## 2018-04-04 ENCOUNTER — Encounter: Payer: Self-pay | Admitting: General Practice

## 2018-04-04 NOTE — Telephone Encounter (Signed)
Called and tried to advise pt. Letter also written.

## 2018-04-04 NOTE — Telephone Encounter (Signed)
Last OV 10/12/17 Alprazolam last filled 02/22/18 #30 with 0

## 2018-04-04 NOTE — Telephone Encounter (Signed)
This will be last refill w/o appt as I have not seen pt since Oct 2018.  Overdue for followup

## 2018-04-07 ENCOUNTER — Telehealth: Payer: Self-pay | Admitting: Family Medicine

## 2018-04-07 MED ORDER — LEVOTHYROXINE SODIUM 112 MCG PO TABS: 112.0000 ug | ORAL_TABLET | Freq: Every day | ORAL | 0 refills | Status: DC

## 2018-04-07 NOTE — Telephone Encounter (Signed)
Copied from [place] [id number]. Topic: Quick Communication - Rx Refill/Question >> Apr 07, 2018 10:40 AM Synthia Innocent wrote: Medication: levothyroxine (SYNTHROID, LEVOTHROID) 112 MCG tablet, appt scheduled for 04/13/18, will run out of meds prior to appt.   Has the patient contacted their pharmacy? Yes.   (Agent: If no, request that the patient contact the pharmacy for the refill.) (Agent: If yes, when and what did the pharmacy advise?)  Preferred Pharmacy (with phone number or street name): [facility] [place]  Agent: Please be advised that RX refills may take up to 3 business days. We ask that you follow-up with your pharmacy.

## 2018-04-09 ENCOUNTER — Other Ambulatory Visit: Payer: Self-pay | Admitting: Family Medicine

## 2018-04-13 ENCOUNTER — Telehealth: Payer: Self-pay | Admitting: Emergency Medicine

## 2018-04-13 ENCOUNTER — Ambulatory Visit: Payer: Medicare Other | Admitting: Family Medicine

## 2018-04-13 NOTE — Telephone Encounter (Signed)
FYI

## 2018-04-13 NOTE — Telephone Encounter (Signed)
Copied from [place] [id number]. Topic: Quick Communication - Appointment Cancellation >> Apr 13, 2018 10:23 AM Neva Seat wrote: Patient called to cancel appointment scheduled for today with [place] at 3:30  Patient has rescheduled their appointment.  Route to department's PEC pool.

## 2018-04-14 ENCOUNTER — Other Ambulatory Visit: Payer: Self-pay

## 2018-04-14 ENCOUNTER — Ambulatory Visit (INDEPENDENT_AMBULATORY_CARE_PROVIDER_SITE_OTHER): Payer: Medicare Other | Admitting: Family Medicine

## 2018-04-14 ENCOUNTER — Encounter: Payer: Self-pay | Admitting: General Practice

## 2018-04-14 ENCOUNTER — Encounter: Payer: Self-pay | Admitting: Family Medicine

## 2018-04-14 VITALS — BP 114/76 | HR 60 | Temp 98.0°F | Resp 16 | Ht 62.0 in | Wt 159.0 lb

## 2018-04-14 DIAGNOSIS — I6522 Occlusion and stenosis of left carotid artery: Secondary | ICD-10-CM

## 2018-04-14 DIAGNOSIS — I10 Essential (primary) hypertension: Secondary | ICD-10-CM

## 2018-04-14 DIAGNOSIS — E538 Deficiency of other specified B group vitamins: Secondary | ICD-10-CM | POA: Diagnosis not present

## 2018-04-14 DIAGNOSIS — Z23 Encounter for immunization: Secondary | ICD-10-CM | POA: Diagnosis not present

## 2018-04-14 DIAGNOSIS — E785 Hyperlipidemia, unspecified: Secondary | ICD-10-CM

## 2018-04-14 DIAGNOSIS — E890 Postprocedural hypothyroidism: Secondary | ICD-10-CM

## 2018-04-14 LAB — LIPID PANEL
CHOL/HDL RATIO: 3
Cholesterol: 132 mg/dL (ref 0–200)
HDL: 48.2 mg/dL (ref >39.00)
LDL CALC: 70 mg/dL (ref 0–99)
NonHDL: 84.24
TRIGLYCERIDES: 73 mg/dL (ref 0.0–149.0)
VLDL: 14.6 mg/dL (ref 0.0–40.0)

## 2018-04-14 LAB — TSH: TSH: 2.07 u[IU]/mL (ref 0.35–4.50)

## 2018-04-14 LAB — BASIC METABOLIC PANEL
BUN: 24 mg/dL — ABNORMAL HIGH (ref 6–23)
CHLORIDE: 101 meq/L (ref 96–112)
CO2: 28 meq/L (ref 19–32)
CREATININE: 1.14 mg/dL (ref 0.40–1.20)
Calcium: 9.5 mg/dL (ref 8.4–10.5)
GFR: 48.85 mL/min — ABNORMAL LOW (ref >60.00)
GLUCOSE: 112 mg/dL — AB (ref 70–99)
Potassium: 3.6 mEq/L (ref 3.5–5.1)
SODIUM: 138 meq/L (ref 135–145)

## 2018-04-14 LAB — VITAMIN B12: VITAMIN B 12: 246 pg/mL (ref 211–911)

## 2018-04-14 NOTE — Assessment & Plan Note (Signed)
Chronic problem.  Tolerating statin w/o difficulty.  Check labs.  Adjust meds prn  

## 2018-04-14 NOTE — Assessment & Plan Note (Signed)
Check labs due to pt's chronic fatigue and hx of deficiency.  Replete prn.

## 2018-04-14 NOTE — Assessment & Plan Note (Signed)
Chronic problem.  Excellent control on Losartan HCTZ and Metoprolol.  Currently asymptomatic.  Reviewed labs done at ER- no need to repeat CBC.  Will continue to follow.

## 2018-04-14 NOTE — Patient Instructions (Signed)
Schedule your Medicare Wellness Visit with Colleen Wright Follow up with me in 6 months to recheck BP and cholesterol We'll notify you of your lab results and make any changes if needed Keep up the good work on healthy diet and regular exercise- you can do it!! Call with any questions or concerns Happy Labor Day!!!

## 2018-04-14 NOTE — Assessment & Plan Note (Signed)
S/p thyroidectomy due to thyroid cancer.  + fatigue but this is 'constant' for pt.  Check labs.  Adjust meds prn

## 2018-04-14 NOTE — Progress Notes (Signed)
   Subjective:    Patient ID: Colleen Wright, female    DOB: [birth year], 77 y.o.   MRN: [id number]  HPI Hyperlipidemia- chronic problem, on Lipitor 40mg  daily.  No abd pain, N/V (with exception of episode on 8/18).  HTN- chronic problem, on Losartan HCTZ 50/12.5mg  daily and Metoprolol 50mg  daily w/ good control.  Denies CP, SOB, HAs, visual changes.  Hypothyroid- chronic problem, on Levothyroxine 114mcg.  + fatigue.  Denies changes to skin/hair/nails   Review of Systems For ROS see HPI     Objective:   Physical Exam  Constitutional: She is oriented to person, place, and time. She appears well-developed and well-nourished. No distress.  HENT:  Head: Normocephalic and atraumatic.  Eyes: Pupils are equal, round, and reactive to light. Conjunctivae and EOM are normal.  Neck: Normal range of motion. Neck supple. No thyromegaly present.  Cardiovascular: Normal rate, regular rhythm, normal heart sounds and intact distal pulses.  No murmur heard. Pulmonary/Chest: Effort normal and breath sounds normal. No respiratory distress.  Abdominal: Soft. She exhibits no distension. There is no tenderness.  Musculoskeletal: She exhibits no edema.  Lymphadenopathy:    She has no cervical adenopathy.  Neurological: She is alert and oriented to person, place, and time.  Skin: Skin is warm and dry.  Psychiatric: She has a normal mood and affect. Her behavior is normal.  Vitals reviewed.         Assessment & Plan:

## 2018-04-18 LAB — CUP PACEART REMOTE DEVICE CHECK
Implantable Pulse Generator Implant Date: 20170207
MDC IDC SESS DTM: 20190713161013

## 2018-05-02 DIAGNOSIS — H02105 Unspecified ectropion of left lower eyelid: Secondary | ICD-10-CM | POA: Diagnosis not present

## 2018-05-02 DIAGNOSIS — H2513 Age-related nuclear cataract, bilateral: Secondary | ICD-10-CM | POA: Diagnosis not present

## 2018-05-03 ENCOUNTER — Ambulatory Visit (INDEPENDENT_AMBULATORY_CARE_PROVIDER_SITE_OTHER): Payer: Medicare Other | Admitting: *Deleted

## 2018-05-03 DIAGNOSIS — I639 Cerebral infarction, unspecified: Secondary | ICD-10-CM

## 2018-05-04 NOTE — Progress Notes (Signed)
Carelink Summary Report / Loop Recorder 

## 2018-05-09 LAB — CUP PACEART REMOTE DEVICE CHECK
Date Time Interrogation Session: 20190815161123
Implantable Pulse Generator Implant Date: 20170207

## 2018-05-11 ENCOUNTER — Other Ambulatory Visit: Payer: Self-pay | Admitting: Family Medicine

## 2018-05-11 NOTE — Telephone Encounter (Signed)
Last OV 04/14/18 Alprazolam last filled 04/04/18 #30 with 0

## 2018-05-15 LAB — CUP PACEART REMOTE DEVICE CHECK
Date Time Interrogation Session: 20190917163854
MDC IDC PG IMPLANT DT: 20170207

## 2018-06-02 ENCOUNTER — Other Ambulatory Visit: Payer: Self-pay | Admitting: Family Medicine

## 2018-06-06 ENCOUNTER — Ambulatory Visit (INDEPENDENT_AMBULATORY_CARE_PROVIDER_SITE_OTHER): Payer: Medicare Other | Admitting: *Deleted

## 2018-06-06 DIAGNOSIS — I639 Cerebral infarction, unspecified: Secondary | ICD-10-CM

## 2018-06-06 NOTE — Progress Notes (Signed)
Carelink Summary Report / Loop Recorder 

## 2018-06-14 ENCOUNTER — Other Ambulatory Visit: Payer: Self-pay | Admitting: Family Medicine

## 2018-06-14 NOTE — Telephone Encounter (Signed)
Last OV 04/14/18 Alprazolam last filled 05/11/18 #30 with 0

## 2018-06-19 ENCOUNTER — Other Ambulatory Visit: Payer: Self-pay | Admitting: Family Medicine

## 2018-06-19 ENCOUNTER — Other Ambulatory Visit: Payer: Self-pay | Admitting: Cardiovascular Disease

## 2018-06-19 DIAGNOSIS — I10 Essential (primary) hypertension: Secondary | ICD-10-CM

## 2018-06-24 LAB — CUP PACEART REMOTE DEVICE CHECK
Date Time Interrogation Session: 20191020164051
MDC IDC PG IMPLANT DT: 20170207

## 2018-07-08 ENCOUNTER — Ambulatory Visit (INDEPENDENT_AMBULATORY_CARE_PROVIDER_SITE_OTHER): Payer: Medicare Other

## 2018-07-08 DIAGNOSIS — I639 Cerebral infarction, unspecified: Secondary | ICD-10-CM | POA: Diagnosis not present

## 2018-07-11 NOTE — Progress Notes (Signed)
Carelink Summary Report / Loop Recorder 

## 2018-07-29 ENCOUNTER — Other Ambulatory Visit: Payer: Self-pay | Admitting: Family Medicine

## 2018-07-29 ENCOUNTER — Telehealth: Payer: Self-pay | Admitting: Family Medicine

## 2018-07-29 ENCOUNTER — Other Ambulatory Visit: Payer: Self-pay | Admitting: Cardiovascular Disease

## 2018-07-29 NOTE — Telephone Encounter (Signed)
Please advise if ok to send in 90 day prescription when this refill is due?

## 2018-07-29 NOTE — Telephone Encounter (Signed)
Called and advise pt.

## 2018-07-29 NOTE — Telephone Encounter (Signed)
[place] for #90 when refill is due

## 2018-07-29 NOTE — Telephone Encounter (Signed)
Copied from [place] [id number]. Topic: Quick Communication - Rx Refill/Question >> Jul 29, 2018 12:22 PM Margot Ables wrote: Medication: ALPRAZolam Duanne Moron) 0.5 MG tablet - pt requesting RX for 90 day supply as it is cheaper for her - she has 2 weeks left but [facility]  Has the patient contacted their pharmacy? Yes - new RX needed to dispense 90 day  Preferred Pharmacy (with phone number or street name): [id number] [place], [place] - [address] [phone] (Phone) [phone] (Fax)

## 2018-08-11 ENCOUNTER — Ambulatory Visit (INDEPENDENT_AMBULATORY_CARE_PROVIDER_SITE_OTHER): Payer: Medicare Other

## 2018-08-11 DIAGNOSIS — I639 Cerebral infarction, unspecified: Secondary | ICD-10-CM | POA: Diagnosis not present

## 2018-08-11 LAB — CUP PACEART REMOTE DEVICE CHECK
Implantable Pulse Generator Implant Date: 20170207
MDC IDC SESS DTM: 20191225171109

## 2018-08-11 NOTE — Progress Notes (Signed)
Carelink Summary Report / Loop Recorder 

## 2018-08-28 LAB — CUP PACEART REMOTE DEVICE CHECK
Implantable Pulse Generator Implant Date: 20170207
MDC IDC SESS DTM: 20191122163940

## 2018-09-05 ENCOUNTER — Other Ambulatory Visit: Payer: Self-pay | Admitting: Family Medicine

## 2018-09-09 ENCOUNTER — Ambulatory Visit: Payer: Medicare Other | Admitting: Cardiovascular Disease

## 2018-09-12 ENCOUNTER — Ambulatory Visit (INDEPENDENT_AMBULATORY_CARE_PROVIDER_SITE_OTHER): Payer: Medicare Other

## 2018-09-12 DIAGNOSIS — I639 Cerebral infarction, unspecified: Secondary | ICD-10-CM

## 2018-09-13 LAB — CUP PACEART REMOTE DEVICE CHECK
Implantable Pulse Generator Implant Date: 20170207
MDC IDC SESS DTM: 20200127171142

## 2018-09-13 NOTE — Progress Notes (Signed)
Carelink Summary Report / Loop Recorder 

## 2018-09-21 ENCOUNTER — Other Ambulatory Visit: Payer: Self-pay | Admitting: Family Medicine

## 2018-09-21 NOTE — Telephone Encounter (Signed)
Last OV 04/14/18 Alprazolam last filled 06/14/18 #30 with 3

## 2018-09-22 ENCOUNTER — Encounter: Payer: Self-pay | Admitting: Cardiovascular Disease

## 2018-09-22 ENCOUNTER — Ambulatory Visit (INDEPENDENT_AMBULATORY_CARE_PROVIDER_SITE_OTHER): Payer: Medicare Other | Admitting: Cardiovascular Disease

## 2018-09-22 VITALS — BP 120/66 | HR 67 | Ht 62.0 in | Wt 156.8 lb

## 2018-09-22 DIAGNOSIS — Z85828 Personal history of other malignant neoplasm of skin: Secondary | ICD-10-CM | POA: Diagnosis not present

## 2018-09-22 DIAGNOSIS — I639 Cerebral infarction, unspecified: Secondary | ICD-10-CM | POA: Diagnosis not present

## 2018-09-22 DIAGNOSIS — L821 Other seborrheic keratosis: Secondary | ICD-10-CM | POA: Diagnosis not present

## 2018-09-22 DIAGNOSIS — I10 Essential (primary) hypertension: Secondary | ICD-10-CM

## 2018-09-22 DIAGNOSIS — Z4509 Encounter for adjustment and management of other cardiac device: Secondary | ICD-10-CM

## 2018-09-22 DIAGNOSIS — I25118 Atherosclerotic heart disease of native coronary artery with other forms of angina pectoris: Secondary | ICD-10-CM | POA: Diagnosis not present

## 2018-09-22 DIAGNOSIS — Z7901 Long term (current) use of anticoagulants: Secondary | ICD-10-CM

## 2018-09-22 DIAGNOSIS — I480 Paroxysmal atrial fibrillation: Secondary | ICD-10-CM | POA: Diagnosis not present

## 2018-09-22 DIAGNOSIS — L570 Actinic keratosis: Secondary | ICD-10-CM | POA: Diagnosis not present

## 2018-09-22 DIAGNOSIS — L738 Other specified follicular disorders: Secondary | ICD-10-CM | POA: Diagnosis not present

## 2018-09-22 NOTE — Patient Instructions (Addendum)
Medication Instructions:  The current medical regimen is effective;  continue present plan and medications.  If you need a refill on your cardiac medications before your next appointment, please call your pharmacy.   Testing/Procedures: Loop Extraction - February 27th 2020 at 9 am call if you need to reschedule.  Follow-Up: At [facility], you and your health needs are our priority.  As part of our continuing mission to provide you with exceptional heart care, we have created designated Provider Care Teams.  These Care Teams include your primary Cardiologist (physician) and Advanced Practice Providers (APPs -  Physician Assistants and Nurse Practitioners) who all work together to provide you with the care you need, when you need it. You will need a follow up appointment in 12 months.  Please call our office 2 months in advance to schedule this appointment.  You may see Sanda Klein, MD or one of the following Advanced Practice Providers on your designated Care Team: [place], [place] . [name], PA-C  Any Other Special Instructions Will Be Listed Below (If Applicable). Fill out the patient assistance forms for Eliquis

## 2018-09-22 NOTE — Progress Notes (Signed)
Patient ID: Colleen Wright, female   DOB: 1941/04/05, 78 y.o.   MRN: [id number]    Cardiology Office Note    Date:  09/22/2018   ID:  Colleen Wright, DOB 10-25-1940, MRN [id number]  PCP:  Midge Minium, MD  Cardiologist:   Sanda Klein, MD   No chief complaint on file. Atrial fibrillation, coronary and carotid disease.  History of Present Illness:  Colleen Wright is a 78 y.o. 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  Colleen Wright has done quite well since her last appointment and has no cardiovascular complaints.  Her loop recorder has continued to show infrequent and brief episodes of paroxysmal atrial fibrillation, often with rapid ventricular response, but never symptomatic.  She has not had any new neurological events.  She has not had any bleeding problems or injuries or falls.  Unfortunately she has difficulty paying for the Eliquis and has decided to take it only once daily.  She also takes aspirin.  She has an excellent lipid profile and does not have diabetes mellitus or known vascular disease..  The patient specifically denies any chest pain at rest exertion, dyspnea at rest or with exertion, orthopnea, paroxysmal nocturnal dyspnea, syncope, palpitations, focal neurological deficits, intermittent claudication, lower extremity edema, unexplained weight gain, cough, hemoptysis or wheezing.  Cardiac catheterization in June 2017 showed a 50% mid RCA, 70% distal RCA, farther downstream 80% distal RCA/PL branch stenosis as well as 60% mid LAD stenosis. She has normal left ventricle  systolic function.  The right coronary artery was very tortuous and medical therapy was recommended.   Past Medical History:  Diagnosis Date  . Adenomatous colon polyp 2006  . Allergy   . Arthritis    low back  . Back pain 06/13/2011  . CAD (coronary artery disease) 06/12/2011  . Depression   . GERD (gastroesophageal reflux disease)   . Hiatal hernia   . Hyperlipidemia   . Hypertension   . Hypothyroidism 06/12/2011  . Leg fracture   . Pain in joint, ankle and foot 11/02/2012  . Pain of left heel 05/23/2012  . Skin lesions, generalized 05/23/2012  . Thyroid cancer ([place])   . Ulcer   . Vertigo 06/13/2011    Past Surgical History:  Procedure Laterality Date  . BALLOON ANGIOPLASTY, ARTERY    . CARDIAC CATHETERIZATION N/A 01/24/2016   Procedure: Left Heart Cath and Coronary Angiography;  Surgeon: Peter M Martinique, MD;  Location: [facility];  Service: Cardiovascular;  Laterality: N/A;  . EP IMPLANTABLE DEVICE N/A 09/24/2015   Procedure: Loop Recorder Insertion;  Surgeon: Sanda Klein, MD;  Location: [facility];  Service: Cardiovascular;  Laterality: N/A;  . FRACTURE SURGERY     left leg, pin and 5 bolts in place in lower tibia  . LESION REMOVAL     vaginal  . THYROIDECTOMY, PARTIAL     twice  . TUBAL LIGATION     growth removed from posterior vaginal vault    Current Medications: Outpatient Medications Prior to Visit  Medication Sig Dispense Refill  . ALPRAZolam (XANAX) 0.5 MG tablet TAKE 1 TABLET BY MOUTH AT BEDTIME AS NEEDED FOR ANXIETY 30  tablet 0  . aspirin EC 81 MG tablet Take 81 mg by mouth daily.    Marland Kitchen atorvastatin (LIPITOR) 40 MG tablet TAKE 1 TABLET BY MOUTH EVERY DAY AT 6 PM 90 tablet 0  . ELIQUIS 5 MG TABS tablet TAKE 1 TABLET(5 MG) BY MOUTH TWICE DAILY (Patient taking differently: Take 5 mg by mouth daily. ) 180 tablet 0  . levothyroxine (SYNTHROID, LEVOTHROID) 112 MCG tablet TAKE 1 TABLET EVERY DAY 90 tablet 0  . losartan-hydrochlorothiazide (HYZAAR)  50-12.5 MG tablet TAKE 1 TABLET BY MOUTH EVERY DAY 90 tablet 0  . metoprolol succinate (TOPROL-XL) 50 MG 24 hr tablet TAKE 1 TABLET BY MOUTH DAILY IMMEDIATLY FOLLOWING A MEAL 90 tablet 0   No facility-administered medications prior to visit.      Allergies:   Amoxicillin   Social History   Socioeconomic History  . Marital status: Married    Spouse name: Not on file  . Number of children: Not on file  . Years of education: Not on file  . Highest education level: Not on file  Occupational History  . Not on file  Social Needs  . Financial resource strain: Not on file  . Food insecurity:    Worry: Not on file    Inability: Not on file  . Transportation needs:    Medical: Not on file    Non-medical: Not on file  Tobacco Use  . Smoking status: Former Research scientist (life sciences)  . Smokeless tobacco: Never Used  Substance and Sexual Activity  . Alcohol use: No    Alcohol/week: 0.0 standard drinks  . Drug use: No  . Sexual activity: Not Currently  Lifestyle  . Physical activity:    Days per week: Not on file    Minutes per session: Not on file  . Stress: Not on file  Relationships  . Social connections:    Talks on phone: Not on file    Gets together: Not on file    Attends religious service: Not on file    Active member of club or organization: Not on file    Attends meetings of clubs or organizations: Not on file    Relationship status: Not on file  Other Topics Concern  . Not on file  Social History Narrative  . Not on file     Family History:  The patient's family history includes Cancer in her cousin, mother, and sister; Heart disease in her brother, father, and sister; Hypertension in her sister; Stroke in her mother and sister.   ROS:   Please see the history of present illness.    ROS all other systems are reviewed and are negative   PHYSICAL EXAM:   VS:  BP 120/66   Pulse 67   Ht 5\' 2"  (1.575 m)   Wt 156 lb 12.8 oz (71.1 kg)   BMI 28.68 kg/m      General: Alert,  oriented x3, no distress, mildly overweight.  Healthy left parasternal loop recorder site. Head: no evidence of trauma, PERRL, EOMI, no exophtalmos or lid lag, no myxedema, no xanthelasma; normal ears, nose and oropharynx Neck: normal jugular venous pulsations and no hepatojugular reflux; brisk carotid pulses without delay and no carotid bruits Chest: clear to auscultation, no signs of consolidation by percussion or palpation, normal fremitus, symmetrical and full respiratory excursions Cardiovascular: normal position and quality of the apical impulse, regular rhythm, normal first and second heart sounds, no murmurs, rubs or gallops Abdomen: no tenderness or distention, no masses by palpation,  no abnormal pulsatility or arterial bruits, normal bowel sounds, no hepatosplenomegaly Extremities: no clubbing, cyanosis or edema; 2+ radial, ulnar and brachial pulses bilaterally; 2+ right femoral, posterior tibial and dorsalis pedis pulses; 2+ left femoral, posterior tibial and dorsalis pedis pulses; no subclavian or femoral bruits Neurological: grossly nonfocal Psych: Normal mood and affect    Wt Readings from Last 3 Encounters:  09/22/18 156 lb 12.8 oz (71.1 kg)  04/14/18 159 lb (72.1 kg)  04/03/18 160 lb (72.6 kg)      Studies/Labs Reviewed:   EKG:  EKG is ordered today. Shows normal sinus rhythm and is a normal tracing.  Recent Labs: 04/03/2018: ALT 23; Hemoglobin 11.9; Platelets 177 04/14/2018: BUN 24; Creatinine, Ser 1.14; Potassium 3.6; Sodium 138; TSH 2.07   Lipid Panel    Component Value Date/Time   CHOL 132 04/14/2018 1041   TRIG 73.0 04/14/2018 1041   HDL 48.20 04/14/2018 1041   CHOLHDL 3 04/14/2018 1041   VLDL 14.6 04/14/2018 1041   LDLCALC 70 04/14/2018 1041      ASSESSMENT:    1. Paroxysmal atrial fibrillation (HCC)   2. Coronary artery disease of native artery of native heart with stable angina pectoris ([place])   3. Long term (current) use of anticoagulants   4.  Essential hypertension   5. Encounter for loop recorder check      PLAN:  In order of problems listed above:  1. CAD: Since starting treatment with beta-blocker she has not had any exertional chest pain.  On statin and aspirin.  Single-vessel disease, felt to be challenging for PCI. 2. PAF: The overall burden of atrial fibrillation is low at less than 1% but she has had about 80 events in the last 3 years.  She should continue anticoagulation especially since she has a history of stroke. CHADSVasc 6 (age 18, gender, CAD, CVA 2).  Antiarrhythmics are not indicated.  Although occasional RVR is recorded I do not think she needs more beta-blockers. 3. Eliquis: Anticoagulation is well tolerated without bleeding complications.  However, she has a hard time paying for it and has been taking only 1 pill a day.  I would rather she take a half dose (2.5 mg) twice daily rather than take the medication only once a day.  She is almost 78 years old but her weight and renal function are above the usual cutoff for reduced dose Eliquis I think she still really qualifies for 5 mg twice daily.  We will try to see if she can get assistance from the pharmaceutical company. 4. HTN: Excellent control 5. Loop recorder: The device is functioning normally but anticipated will reach end of service in the next several weeks.  She would like it removed, because she finds the idea of a foreign implant in her right makes her uneasy.  We will schedule it nonurgently. This procedure has been fully reviewed with the patient and written informed consent has been obtained.     Medication Adjustments/Labs and Tests Ordered: Current medicines are reviewed at length with the patient today.  Concerns regarding medicines are outlined above.  Medication changes, Labs and Tests ordered today are listed in the Patient Instructions below. Patient Instructions  Medication Instructions:  The current medical regimen is effective;  continue  present plan and medications.  If you need a refill on your cardiac medications before your next appointment, please call your pharmacy.   Testing/Procedures: Loop Extraction - February 27th 2020 at 9 am call if you need to reschedule.  Follow-Up: At [facility], you and your health needs are our priority.  As part of our continuing mission to provide you with exceptional heart care, we have created designated Provider Care Teams.  These Care Teams include your primary Cardiologist (physician) and Advanced Practice Providers (APPs -  Physician Assistants and Nurse Practitioners) who all work together to provide you with the care you need, when you need it. You will need a follow up appointment in 12 months.  Please call our office 2 months in advance to schedule this appointment.  You may see Sanda Klein, MD or one of the following Advanced Practice Providers on your designated Care Team: [place], [place] . [name], PA-C  Any Other Special Instructions Will Be Listed Below (If Applicable). Fill out the patient assistance forms for Eliquis       Signed, Sanda Klein, MD  09/22/2018 2:[address], [place], [place]  [address] Phone: [phone]; Fax: [phone]

## 2018-10-04 ENCOUNTER — Telehealth: Payer: Self-pay | Admitting: Cardiovascular Disease

## 2018-10-04 LAB — CUP PACEART INCLINIC DEVICE CHECK
MDC IDC PG IMPLANT DT: 20170207
MDC IDC SESS DTM: 20200218143146

## 2018-10-04 NOTE — Telephone Encounter (Signed)
Spoke with patient and rescheduled her loop extraction.

## 2018-10-04 NOTE — Telephone Encounter (Signed)
  Patient wants to reschedule her procedure because she is in the process of moving houses and needs to put it off.

## 2018-10-15 LAB — CUP PACEART REMOTE DEVICE CHECK
Implantable Pulse Generator Implant Date: 20170207
MDC IDC SESS DTM: 20200229170948

## 2018-10-17 ENCOUNTER — Ambulatory Visit (INDEPENDENT_AMBULATORY_CARE_PROVIDER_SITE_OTHER): Payer: Medicare Other | Admitting: *Deleted

## 2018-10-17 DIAGNOSIS — I639 Cerebral infarction, unspecified: Secondary | ICD-10-CM

## 2018-10-20 ENCOUNTER — Other Ambulatory Visit: Payer: Self-pay | Admitting: Family Medicine

## 2018-10-20 DIAGNOSIS — I10 Essential (primary) hypertension: Secondary | ICD-10-CM

## 2018-10-24 ENCOUNTER — Telehealth: Payer: Self-pay | Admitting: Cardiovascular Disease

## 2018-10-24 NOTE — Telephone Encounter (Signed)
Spoke with patient, her symptoms are dysuria, frequency, nocturia and urgency. Denies any changes in odor, low back pain or pelvic pain. She states she has had UTI previous and having same symptoms Please advise

## 2018-10-24 NOTE — Progress Notes (Signed)
Carelink Summary Report / Loop Recorder 

## 2018-10-24 NOTE — Telephone Encounter (Signed)
Pt called and asked if Dr would send in an rx for UTI medication. She does not want to come into the office during flu season.  [id number] [place], [place] - [address] as her main pharmacy.  Please call if unable to fill the request

## 2018-10-24 NOTE — Telephone Encounter (Signed)
Started 3-4 days ago, She states she is increasing hydration.

## 2018-10-24 NOTE — Telephone Encounter (Signed)
I would need to know if she is having sxs for why she feels she needs a medication.Marland KitchenMarland Kitchen

## 2018-10-25 MED ORDER — CEPHALEXIN 500 MG PO CAPS: 500.0000 mg | ORAL_CAPSULE | Freq: Two times a day (BID) | ORAL | 0 refills | Status: DC

## 2018-10-25 NOTE — Telephone Encounter (Signed)
Ok for Keflex 500mg  BID x5 days, #10, no refills

## 2018-10-25 NOTE — Telephone Encounter (Signed)
Medication filled to pharmacy as requested.  Pt made aware. Advised if symptoms do not improve she will need to be seen.

## 2018-11-05 ENCOUNTER — Other Ambulatory Visit: Payer: Self-pay | Admitting: Family Medicine

## 2018-11-07 NOTE — Telephone Encounter (Signed)
Last refill:09/22/18 #30, 0 Last OV:04/14/18

## 2018-11-11 ENCOUNTER — Telehealth: Payer: Self-pay | Admitting: *Deleted

## 2018-11-11 NOTE — Telephone Encounter (Signed)
Patient scheduled for loop extraction next week. This will need to be rescheduled, patient aware and has been canceled at hospital. Per Dr Sallyanne Kuster he will do H&P day of procedure. Will forward to [facility] LPN to schedule after COVID 19 improved.

## 2018-11-17 ENCOUNTER — Ambulatory Visit (INDEPENDENT_AMBULATORY_CARE_PROVIDER_SITE_OTHER): Payer: Medicare Other | Admitting: *Deleted

## 2018-11-17 ENCOUNTER — Other Ambulatory Visit: Payer: Self-pay

## 2018-11-17 DIAGNOSIS — I639 Cerebral infarction, unspecified: Secondary | ICD-10-CM

## 2018-11-17 LAB — CUP PACEART REMOTE DEVICE CHECK
Date Time Interrogation Session: 20200402152409
Implantable Pulse Generator Implant Date: 20170207

## 2018-11-21 ENCOUNTER — Ambulatory Visit (HOSPITAL_COMMUNITY): Admission: RE | Admit: 2018-11-21 | Payer: Medicare Other | Source: Home / Self Care | Admitting: Cardiovascular Disease

## 2018-11-21 ENCOUNTER — Encounter (HOSPITAL_COMMUNITY): Admission: RE | Payer: Self-pay | Source: Home / Self Care

## 2018-11-21 SURGERY — LOOP RECORDER REMOVAL

## 2018-11-24 NOTE — Progress Notes (Signed)
Carelink Summary Report / Loop Recorder 

## 2018-12-01 ENCOUNTER — Other Ambulatory Visit: Payer: Self-pay | Admitting: Family Medicine

## 2018-12-09 NOTE — Telephone Encounter (Signed)
Called patient no answer.Left message to call loop recorder removal scheduled.

## 2018-12-12 ENCOUNTER — Telehealth: Payer: Self-pay | Admitting: Cardiovascular Disease

## 2018-12-12 NOTE — Telephone Encounter (Signed)
Spoke to patient loop recorder extraction scheduled 01/19/19 at 2:30 pm.Arrive at 1:30 pm.Advised I will mail instructions and call back to review.

## 2018-12-12 NOTE — Telephone Encounter (Signed)
Spoke to patient loop extraction scheduled for 01/19/19 at 2:30 pm arrive at [facility] hospital at 1:30 pm.Advised I will mail instructions and call back to review.

## 2018-12-12 NOTE — Telephone Encounter (Signed)
Patient is returning call from [place] regarding setting up her appt for a loop recorder

## 2018-12-14 ENCOUNTER — Other Ambulatory Visit: Payer: Self-pay | Admitting: Family Medicine

## 2018-12-14 NOTE — Telephone Encounter (Signed)
Last refill:11/07/18 #30, 0 Last OV:04/14/18

## 2018-12-19 ENCOUNTER — Telehealth: Payer: Self-pay

## 2018-12-19 NOTE — Telephone Encounter (Signed)
  [facility] [place], [place]  [place], [place] [address]  Phone: [phone] Fax: [phone]   You are scheduled for a Loop Recorder Extraction on Thursday 01/19/19 with Dr. Sallyanne Kuster.   Please arrive at the [place] [facility] [place] at 1:30 pm on the day of your procedure.    You do not need to be fasting.   You do not need pre-procedure lab work or testing.   Please bring your insurance cards and a list of your medications with you.   Wash your chest and neck with the surgical soap provided the evening before and the morning of your procedure. Please following the washing instructions provided.   There aren't any restrictions after the procedure. You will receive wound care instructions upon discharge.  If you have any questions after you get home, please call the office at [phone].   Thank you, Sanda Klein, MD   * Special note:  Every effort is made to have your procedure done on time.  Occasionally there are emergencies that present themselves at the hospital that may cause delays.  Please be patient if a delay does occur.  Preparing for Surgery  Before surgery, you can play an important role. Because skin is not sterile, your skin needs to be as free of germs as possible. You can reduce the number of germs on your skin by washing with CHG (chlorhexidine gluconate) Soap before surgery. CHG is an antiseptic cleaner which kills germs and bonds with the skin to continue killing germs even after washing.  Please do not use if you have an allergy to CHG or antibacterial soaps. If your skin becomes reddened/irritated, STOP using the CHG.  DO NOT SHAVE (including legs and underarms) for at least 48 hours prior to first CHG shower. It is OK to shave your face.  Please follow these instructions carefully: 1. Shower the night before surgery and the morning of  surgery with CHG Soap. 2. If you chose to wash your hair, wash your hair first as usual with your normal shampoo/conditioner. 3. After you shampoo/condition, rinse you hair and body thoroughly to remove shampoo/conditioner. 4. Use CHG as you would any other liquid soap. You can apply CHG directly to the skin and wash gently with a loofah or a clean washcloth. 5. Apply the CHG Soap to your body ONLY FROM THE NECK DOWN. Do not use on open wounds or open sores. Avoid contact with your eyes, ears, mouth, and genitals (private parts). Wash genitals (private part) with your normal soap. 6. Wash thoroughly, paying special attention to the area where your surgery will be performed. 7. Thoroughly rinse your body with warm water from the neck down. 8. DO NOT shower/wash with your normal soap after using and rinsing off the CHG Soap. 9. Pat yourself dry with a clean towel. 10. Wear clean pajamas to bed. 11. Place clean sheets on your bed the night of your first shower and do not sleep with pets..  Day of Surgery: Shower with the CHG Soap following the instructions listed above. DO NOT apply deodorants or lotions. Please wear clean clothes to the hospital/surgery center.

## 2018-12-20 ENCOUNTER — Ambulatory Visit (INDEPENDENT_AMBULATORY_CARE_PROVIDER_SITE_OTHER): Payer: Medicare Other | Admitting: *Deleted

## 2018-12-20 ENCOUNTER — Other Ambulatory Visit: Payer: Self-pay

## 2018-12-20 DIAGNOSIS — I639 Cerebral infarction, unspecified: Secondary | ICD-10-CM

## 2018-12-21 LAB — CUP PACEART REMOTE DEVICE CHECK
Date Time Interrogation Session: 20200505180948
Implantable Pulse Generator Implant Date: 20170207

## 2018-12-26 NOTE — Progress Notes (Signed)
Carelink Summary Report / Loop Recorder 

## 2018-12-29 ENCOUNTER — Telehealth: Payer: Self-pay | Admitting: Family Medicine

## 2018-12-29 DIAGNOSIS — H6121 Impacted cerumen, right ear: Secondary | ICD-10-CM | POA: Diagnosis not present

## 2018-12-29 NOTE — Telephone Encounter (Signed)
Tate for pt to have ears cleaned in office

## 2018-12-29 NOTE — Telephone Encounter (Signed)
LM for patient to call to schedule appointment.

## 2018-12-29 NOTE — Telephone Encounter (Signed)
Pt LMOVM asking if she could come in office to have her ears cleaned. States that she is have a hard time hearing. Please advise

## 2018-12-30 NOTE — Telephone Encounter (Signed)
Called patient to see if she wanted a visit with Dr. Birdie Riddle.  She states that she did get my message yesterday, and she came by here but was not sure if anyone was here [facility].  She said they cleaned out her ears and she is much better.  Advised patient to call us back for an appointment if she needed anything further.

## 2019-01-04 ENCOUNTER — Encounter: Payer: Self-pay | Admitting: *Deleted

## 2019-01-04 NOTE — Telephone Encounter (Signed)
Instructions have been given to the patient and they will be mailed to her. She has been advised to call if she has any further questions.   She has requested that her appointment for the Coronavirus test be moved to 11:45 am on 01/16/2019. She has been advised that she will need to pick up her surgical scrub prior to the lab appointment due to the need to be quarantined after the test. The scrub is waiting for her at the [place] office at the front desk.         COVID-19 Pre-Screening Questions:  . In the past 7 to 10 days have you had a cough,  shortness of breath, headache, congestion, fever, body aches, chills, sore throat, or sudden loss of taste or sense of smell? No . Have you been around anyone with known Covid 19. No . Have you been around anyone who is awaiting Covid 19 test results in the past 7 to 10 days? No . Have you been around anyone who has been exposed to Covid 19, or has mentioned symptoms of Covid 19 within the past 7 to 10 days? No  If you have any concerns/questions about symptoms patients report during screening (either on the phone or at threshold). Contact the provider seeing the patient or DOD for further guidance.  If neither are available contact a member of the leadership team.

## 2019-01-04 NOTE — Telephone Encounter (Signed)
Left a message for the patient to call back to go over the procedure instructions.

## 2019-01-04 NOTE — Telephone Encounter (Signed)
Follow up    Patient is returning call in reference to loop recorder instructions.

## 2019-01-06 ENCOUNTER — Other Ambulatory Visit: Payer: Self-pay | Admitting: Cardiovascular Disease

## 2019-01-16 ENCOUNTER — Telehealth: Payer: Self-pay | Admitting: Cardiovascular Disease

## 2019-01-16 ENCOUNTER — Other Ambulatory Visit (HOSPITAL_COMMUNITY): Payer: Medicare Other

## 2019-01-16 ENCOUNTER — Other Ambulatory Visit (HOSPITAL_COMMUNITY)
Admission: RE | Admit: 2019-01-16 | Discharge: 2019-01-16 | Disposition: A | Discharge status: Home / Self Care | Payer: Medicare Other | Source: Ambulatory Visit | Attending: Cardiovascular Disease | Admitting: Cardiovascular Disease

## 2019-01-16 ENCOUNTER — Other Ambulatory Visit: Payer: Self-pay

## 2019-01-16 DIAGNOSIS — Z1159 Encounter for screening for other viral diseases: Secondary | ICD-10-CM | POA: Diagnosis not present

## 2019-01-16 NOTE — Telephone Encounter (Signed)
She will have to wear a mask the whole time she is in any one of our medical facilities, inpatient or outpatient, unless she is the only person in the room.  There is no rush in removing her loop recorder and we can delay it indefinitely.Marland Kitchen

## 2019-01-16 NOTE — Telephone Encounter (Signed)
  Patient wants to know if she has to wear a mask the whole time during the removal of her loop recorder. If so, she would prefer to wait till she doesn't have to wear it.

## 2019-01-16 NOTE — Telephone Encounter (Signed)
Tried to call the pt back and the phone rang several times and hung up... will forward to Dr. Donivan Scull and his nurse re: the pts concerns. Appt 01/19/19

## 2019-01-17 NOTE — Telephone Encounter (Signed)
Spoke with the patient. She stated that she would like to cancel the procedure for now due to the risk of covid. We did discuss all the precautions that the hospital will take but she stated that she would feel better to wait. She will call back when she is ready.

## 2019-01-17 NOTE — Telephone Encounter (Signed)
Follow up  ° ° °Pt is returning call  ° ° °Please call back  °

## 2019-01-17 NOTE — Telephone Encounter (Signed)
Attempted to reach the patient. The phone rang and then disconnects. The other number has a voicemail that has not been set up yet.

## 2019-01-18 ENCOUNTER — Other Ambulatory Visit: Payer: Self-pay | Admitting: Family Medicine

## 2019-01-18 DIAGNOSIS — I10 Essential (primary) hypertension: Secondary | ICD-10-CM

## 2019-01-18 LAB — NOVEL CORONAVIRUS, NAA (HOSP ORDER, SEND-OUT TO REF LAB; TAT 18-24 HRS): SARS-CoV-2, NAA: NOT DETECTED

## 2019-01-19 ENCOUNTER — Ambulatory Visit (HOSPITAL_COMMUNITY): Admission: RE | Admit: 2019-01-19 | Payer: Medicare Other | Source: Home / Self Care | Admitting: Cardiovascular Disease

## 2019-01-19 ENCOUNTER — Encounter (HOSPITAL_COMMUNITY): Admission: RE | Payer: Self-pay | Source: Home / Self Care

## 2019-01-19 SURGERY — LOOP RECORDER INSERTION
Anesthesia: LOCAL

## 2019-01-23 ENCOUNTER — Ambulatory Visit (INDEPENDENT_AMBULATORY_CARE_PROVIDER_SITE_OTHER): Payer: Medicare Other | Admitting: *Deleted

## 2019-01-23 DIAGNOSIS — I639 Cerebral infarction, unspecified: Secondary | ICD-10-CM | POA: Diagnosis not present

## 2019-01-23 DIAGNOSIS — I480 Paroxysmal atrial fibrillation: Secondary | ICD-10-CM

## 2019-01-23 LAB — CUP PACEART REMOTE DEVICE CHECK
Date Time Interrogation Session: 20200607183952
Implantable Pulse Generator Implant Date: 20170207

## 2019-01-30 ENCOUNTER — Telehealth: Payer: Self-pay | Admitting: Family Medicine

## 2019-01-30 NOTE — Telephone Encounter (Signed)
Please advise, in office or virtual visit?

## 2019-01-30 NOTE — Telephone Encounter (Signed)
Spoke with patient, stated that she did not have access to any electronics to do virtual visit and refused in office visit with PCP. Stated that she did not want to come in if she had to wear a mask - not able to breathe when wearing them. She said that she had several Cyclobenzaprine 10 MG that seemed to help with the back pain, but is almost out. Asking if PCP can send in new script until she feels comfortable being seen in office.

## 2019-01-30 NOTE — Progress Notes (Signed)
Carelink Summary Report / Loop Recorder 

## 2019-01-30 NOTE — Telephone Encounter (Signed)
Pt will need a phone visit- at a minimum- to discuss medication refills

## 2019-01-30 NOTE — Telephone Encounter (Signed)
Pt would need virtual visit

## 2019-01-30 NOTE — Telephone Encounter (Signed)
Patient called and said she is having back pain and its sever and she would like to talk to Dr. Birdie Riddle or her CMA about this to see if she can get meds for it or if she needs an appt. Please call patient back ,thanks.

## 2019-01-31 NOTE — Telephone Encounter (Signed)
Patient has telephone visit scheduled for 6/17

## 2019-02-01 ENCOUNTER — Ambulatory Visit (INDEPENDENT_AMBULATORY_CARE_PROVIDER_SITE_OTHER): Payer: Medicare Other | Admitting: Family Medicine

## 2019-02-01 ENCOUNTER — Other Ambulatory Visit: Payer: Self-pay

## 2019-02-01 ENCOUNTER — Encounter: Payer: Self-pay | Admitting: Family Medicine

## 2019-02-01 DIAGNOSIS — M546 Pain in thoracic spine: Secondary | ICD-10-CM | POA: Diagnosis not present

## 2019-02-01 MED ORDER — TIZANIDINE HCL 4 MG PO TABS: 4.0000 mg | ORAL_TABLET | Freq: Three times a day (TID) | ORAL | 0 refills | Status: DC | PRN

## 2019-02-01 NOTE — Progress Notes (Signed)
Virtual Visit via Video   I connected with patient on 02/01/19 at  9:40 AM EDT by a video enabled telemedicine application and verified that I am speaking with the correct person using two identifiers.  Location patient: Home Location provider: [facility], Office Persons participating in the virtual visit: Patient, Provider, [place] (Jess B)  I discussed the limitations of evaluation and management by telemedicine and the availability of in person appointments. The patient expressed understanding and agreed to proceed.  Interactive audio and video telecommunications were attempted between this provider and patient, however failed, due to patient having technical difficulties OR patient did not have access to video capability.  We continued and completed visit with audio only.   Subjective:   HPI:  Thoracic back pain- 'right above my bra'.  sxs started ~1 month ago.  At times will 'travel straight through my chest'.  Pt reports pain was constant x2 days which is what prompted her to call.  Pt took expired Flexeril w/ mild improvement.  No SOB, CP.  No known injury but in Feb she moved out of her house.  Today pain is much improved.  'my biggest fear is cancer'.  ROS:   See pertinent positives and negatives per HPI.  Patient Active Problem List   Diagnosis Date Noted  . Long term (current) use of anticoagulants 09/22/2018  . Carotid artery stenosis 12/03/2016  . Dysuria 12/03/2016  . BMI 28.0-28.9,adult 05/06/2016  . Chest pain with high risk for cardiac etiology 01/24/2016  . Paroxysmal atrial fibrillation ([place]) 01/08/2016  . History of colonic polyps 12/12/2015  . Cryptogenic stroke ([place]) 09/13/2015  . Insomnia 08/10/2015  . TIA (transient ischemic attack) 08/09/2015  . Vitamin D deficiency 06/21/2015  . B12 deficiency 09/25/2013  . Cerebrovascular disease 06/13/2011  . Essential hypertension 06/13/2011  . Hyperlipidemia   . Postsurgical hypothyroidism   . Coronary  artery disease of native artery of native heart with stable angina pectoris [facility])     Social History   Tobacco Use  . Smoking status: Former Research scientist (life sciences)  . Smokeless tobacco: Never Used  Substance Use Topics  . Alcohol use: No    Alcohol/week: 0.0 standard drinks    Current Outpatient Medications:  .  ALPRAZolam (XANAX) 0.5 MG tablet, TAKE 1 TABLET BY MOUTH AT BEDTIME AS NEEDED FOR ANXIETY (Patient taking differently: Take 0.25-0.5 mg by mouth at bedtime. ), Disp: 30 tablet, Rfl: 1 .  aspirin EC 81 MG tablet, Take 81 mg by mouth daily as needed (chest pain). , Disp: , Rfl:  .  atorvastatin (LIPITOR) 40 MG tablet, TAKE 1 TABLET EVERY DAY AT 6PM (Patient taking differently: Take 40 mg by mouth every evening. ), Disp: 90 tablet, Rfl: 0 .  ELIQUIS 5 MG TABS tablet, TAKE 1 TABLET(5 MG) BY MOUTH TWICE DAILY (Patient taking differently: Take 5 mg by mouth every evening. ), Disp: 180 tablet, Rfl: 0 .  levothyroxine (SYNTHROID, LEVOTHROID) 112 MCG tablet, TAKE 1 TABLET EVERY DAY (Patient taking differently: Take 112 mcg by mouth every evening. ), Disp: 90 tablet, Rfl: 0 .  losartan-hydrochlorothiazide (HYZAAR) 50-12.5 MG tablet, TAKE 1 TABLET BY MOUTH EVERY DAY, Disp: 90 tablet, Rfl: 0 .  metoprolol succinate (TOPROL-XL) 50 MG 24 hr tablet, TAKE 1 TABLET EVERY DAY IMMEDIATELY FOLLOWING A MEAL (Patient taking differently: Take 50 mg by mouth every evening. ), Disp: 90 tablet, Rfl: 0  Allergies  Allergen Reactions  . Amoxicillin Other (See Comments)    Patient develops a very  bad yeast infection. Did it involve swelling of the face/tongue/throat, SOB, or low BP? No Did it involve sudden or severe rash/hives, skin peeling, or any reaction on the inside of your mouth or nose? No Did you need to seek medical attention at a hospital or doctor's office? No When did it last happen?many years If all above answers are "NO", may proceed with cephalosporin use.     Objective:   There were no vitals  taken for this visit.  Pt is able to speak clearly, coherently without shortness of breath or increased work of breathing.  Thought process is linear.  Mood is appropriate.   Assessment and Plan:   Thoracic back pain- new.  Pt reports this has been going on for ~1 month but worsened a few days ago which prompted her to make the appt.  Since then, pain has improved somewhat w/ use of expired Flexeril.  Her biggest fear is cancer (she has hx of thyroid cancer).  Will start Tizanidine PRN and if no improvement, will get xrays and determine next steps.  Pt expressed understanding and is in agreement w/ plan.    [name], MD 02/01/2019  Time spent with the patient: 7.5 minutes, of which >50% was spent in obtaining information about symptoms, reviewing previous labs, evaluations, and treatments, counseling about condition (please see the discussed topics above), and developing a plan to further investigate it; had a number of questions which I addressed.

## 2019-02-01 NOTE — Progress Notes (Signed)
I have discussed the procedure for the virtual visit with the patient who has given consent to proceed with assessment and treatment.   Pt due for repeat carotids 02/15/19 Unable to obtain vitals Davis Gourd, CMA

## 2019-02-03 ENCOUNTER — Other Ambulatory Visit: Payer: Self-pay | Admitting: Family Medicine

## 2019-02-06 ENCOUNTER — Other Ambulatory Visit: Payer: Self-pay | Admitting: Family Medicine

## 2019-02-06 NOTE — Telephone Encounter (Signed)
Last OV 02/01/19 Alprazolam last filled 12/14/18 #30 with 1

## 2019-02-24 ENCOUNTER — Ambulatory Visit (INDEPENDENT_AMBULATORY_CARE_PROVIDER_SITE_OTHER): Payer: Medicare Other | Admitting: *Deleted

## 2019-02-24 DIAGNOSIS — I639 Cerebral infarction, unspecified: Secondary | ICD-10-CM | POA: Diagnosis not present

## 2019-02-25 LAB — CUP PACEART REMOTE DEVICE CHECK
Date Time Interrogation Session: 20200710193938
Implantable Pulse Generator Implant Date: 20170207

## 2019-02-27 ENCOUNTER — Telehealth: Payer: Self-pay

## 2019-02-27 ENCOUNTER — Other Ambulatory Visit: Payer: Self-pay | Admitting: Family Medicine

## 2019-02-27 NOTE — Telephone Encounter (Signed)
Pt returned [place] phone call. I informed her that her monitor was disconnected and has not communicated with [place] in 23 days. I tried to help her send a manual transmission with her home monitor but she received the error code 3230. I advised her to let the monitor charge for 30 minutes and I will give her a call back.

## 2019-02-27 NOTE — Telephone Encounter (Signed)
Unable to speak with patient regarding disconnected monitor

## 2019-02-27 NOTE — Progress Notes (Signed)
Carelink Summary Report / Loop Recorder 

## 2019-02-28 NOTE — Telephone Encounter (Signed)
Pt transmission is received.

## 2019-03-29 ENCOUNTER — Ambulatory Visit (INDEPENDENT_AMBULATORY_CARE_PROVIDER_SITE_OTHER): Payer: Medicare Other | Admitting: *Deleted

## 2019-03-29 DIAGNOSIS — I639 Cerebral infarction, unspecified: Secondary | ICD-10-CM | POA: Diagnosis not present

## 2019-03-30 LAB — CUP PACEART REMOTE DEVICE CHECK
Date Time Interrogation Session: 20200812201022
Implantable Pulse Generator Implant Date: 20170207

## 2019-04-03 ENCOUNTER — Other Ambulatory Visit: Payer: Self-pay

## 2019-04-03 ENCOUNTER — Emergency Department (HOSPITAL_BASED_OUTPATIENT_CLINIC_OR_DEPARTMENT_OTHER): Payer: Medicare Other

## 2019-04-03 ENCOUNTER — Emergency Department (HOSPITAL_BASED_OUTPATIENT_CLINIC_OR_DEPARTMENT_OTHER)
Admission: EM | Admit: 2019-04-03 | Discharge: 2019-04-03 | Disposition: A | Discharge status: Home / Self Care | Payer: Medicare Other | Attending: Emergency Medicine | Admitting: Emergency Medicine

## 2019-04-03 ENCOUNTER — Telehealth: Payer: Self-pay

## 2019-04-03 ENCOUNTER — Encounter (HOSPITAL_BASED_OUTPATIENT_CLINIC_OR_DEPARTMENT_OTHER): Payer: Self-pay | Admitting: *Deleted

## 2019-04-03 DIAGNOSIS — R5383 Other fatigue: Secondary | ICD-10-CM | POA: Diagnosis not present

## 2019-04-03 DIAGNOSIS — Z87891 Personal history of nicotine dependence: Secondary | ICD-10-CM | POA: Insufficient documentation

## 2019-04-03 DIAGNOSIS — I10 Essential (primary) hypertension: Secondary | ICD-10-CM | POA: Diagnosis not present

## 2019-04-03 DIAGNOSIS — R059 Cough, unspecified: Secondary | ICD-10-CM

## 2019-04-03 DIAGNOSIS — E039 Hypothyroidism, unspecified: Secondary | ICD-10-CM | POA: Diagnosis not present

## 2019-04-03 DIAGNOSIS — Z20828 Contact with and (suspected) exposure to other viral communicable diseases: Secondary | ICD-10-CM | POA: Diagnosis not present

## 2019-04-03 DIAGNOSIS — Z8673 Personal history of transient ischemic attack (TIA), and cerebral infarction without residual deficits: Secondary | ICD-10-CM | POA: Insufficient documentation

## 2019-04-03 DIAGNOSIS — Z7901 Long term (current) use of anticoagulants: Secondary | ICD-10-CM | POA: Diagnosis not present

## 2019-04-03 DIAGNOSIS — I2510 Atherosclerotic heart disease of native coronary artery without angina pectoris: Secondary | ICD-10-CM | POA: Diagnosis not present

## 2019-04-03 DIAGNOSIS — Z79899 Other long term (current) drug therapy: Secondary | ICD-10-CM | POA: Diagnosis not present

## 2019-04-03 DIAGNOSIS — R5381 Other malaise: Secondary | ICD-10-CM | POA: Insufficient documentation

## 2019-04-03 DIAGNOSIS — R05 Cough: Secondary | ICD-10-CM | POA: Diagnosis not present

## 2019-04-03 DIAGNOSIS — Z7982 Long term (current) use of aspirin: Secondary | ICD-10-CM | POA: Diagnosis not present

## 2019-04-03 LAB — COMPREHENSIVE METABOLIC PANEL
ALT: 31 U/L (ref 0–44)
AST: 40 U/L (ref 15–41)
Albumin: 4.2 g/dL (ref 3.5–5.0)
Alkaline Phosphatase: 78 U/L (ref 38–126)
Anion gap: 10 (ref 5–15)
BUN: 23 mg/dL (ref 8–23)
CO2: 26 mmol/L (ref 22–32)
Calcium: 9.3 mg/dL (ref 8.9–10.3)
Chloride: 100 mmol/L (ref 98–111)
Creatinine, Ser: 1.21 mg/dL — ABNORMAL HIGH (ref 0.44–1.00)
GFR calc Af Amer: 49 mL/min — ABNORMAL LOW (ref >60)
GFR calc non Af Amer: 42 mL/min — ABNORMAL LOW (ref >60)
Glucose, Bld: 97 mg/dL (ref 70–99)
Potassium: 3.8 mmol/L (ref 3.5–5.1)
Sodium: 136 mmol/L (ref 135–145)
Total Bilirubin: 0.6 mg/dL (ref 0.3–1.2)
Total Protein: 8 g/dL (ref 6.5–8.1)

## 2019-04-03 LAB — URINALYSIS, ROUTINE W REFLEX MICROSCOPIC
Bilirubin Urine: NEGATIVE
Glucose, UA: NEGATIVE mg/dL
Hgb urine dipstick: NEGATIVE
Ketones, ur: NEGATIVE mg/dL
Leukocytes,Ua: NEGATIVE
Nitrite: NEGATIVE
Protein, ur: NEGATIVE mg/dL
Specific Gravity, Urine: 1.02 (ref 1.005–1.030)
pH: 7 (ref 5.0–8.0)

## 2019-04-03 LAB — CBC WITH DIFFERENTIAL/PLATELET
Abs Immature Granulocytes: 0.02 10*3/uL (ref 0.00–0.07)
Basophils Absolute: 0.1 10*3/uL (ref 0.0–0.1)
Basophils Relative: 1 %
Eosinophils Absolute: 0.3 10*3/uL (ref 0.0–0.5)
Eosinophils Relative: 3 %
HCT: 40.8 % (ref 36.0–46.0)
Hemoglobin: 13.3 g/dL (ref 12.0–15.0)
Immature Granulocytes: 0 %
Lymphocytes Relative: 35 %
Lymphs Abs: 3 10*3/uL (ref 0.7–4.0)
MCH: 29.6 pg (ref 26.0–34.0)
MCHC: 32.6 g/dL (ref 30.0–36.0)
MCV: 90.9 fL (ref 80.0–100.0)
Monocytes Absolute: 0.6 10*3/uL (ref 0.1–1.0)
Monocytes Relative: 7 %
Neutro Abs: 4.6 10*3/uL (ref 1.7–7.7)
Neutrophils Relative %: 54 %
Platelets: 197 10*3/uL (ref 150–400)
RBC: 4.49 MIL/uL (ref 3.87–5.11)
RDW: 12.7 % (ref 11.5–15.5)
WBC: 8.6 10*3/uL (ref 4.0–10.5)
nRBC: 0 % (ref 0.0–0.2)

## 2019-04-03 LAB — TSH: TSH: 3.25 u[IU]/mL (ref 0.350–4.500)

## 2019-04-03 LAB — TROPONIN I (HIGH SENSITIVITY)
Troponin I (High Sensitivity): 4 ng/L (ref <18)
Troponin I (High Sensitivity): 4 ng/L (ref <18)

## 2019-04-03 LAB — SARS CORONAVIRUS 2 (TAT 6-24 HRS): SARS Coronavirus 2: NEGATIVE

## 2019-04-03 LAB — MAGNESIUM: Magnesium: 2.1 mg/dL (ref 1.7–2.4)

## 2019-04-03 LAB — BRAIN NATRIURETIC PEPTIDE: B Natriuretic Peptide: 140.8 pg/mL — ABNORMAL HIGH (ref 0.0–100.0)

## 2019-04-03 NOTE — Telephone Encounter (Signed)
Patient called in stating that she is experiencing a hacking cough for the past two weeks. Pain is radiating to her back with the cough. Pain in both legs and extreme weakness. 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  Spot on the right side of her temple that she has been picking at for the past year. Scratched at it last night and the spot become numb. Currently experiencing a headache. Informed patient that with the current COVID restrictions, we are not able to see her in office. Stated that best options would be to go to urgent care or ER. Patient stated that she is going to [facility] in [place].

## 2019-04-03 NOTE — ED Triage Notes (Signed)
Pt c/o non pro cough x 2 weeks , this week c/o generalized fatigue.

## 2019-04-03 NOTE — ED Notes (Signed)
Pt placed on cardiac monitor 

## 2019-04-03 NOTE — ED Provider Notes (Signed)
[place] EMERGENCY DEPARTMENT Provider Note   CSN: [id number] Arrival date & time: 04/03/19  1330     History   Chief Complaint Chief Complaint  Patient presents with  . Cough    HPI Colleen Wright is a 78 y.o. female.     The history is provided by the patient and a relative. No language interpreter was used.  Cough Cough characteristics:  Non-productive Severity:  Moderate Onset quality:  Gradual Duration:  2 weeks Timing:  Constant Progression:  Waxing and waning Chronicity:  New Relieved by:  Nothing Worsened by:  Nothing Ineffective treatments:  None tried Associated symptoms: chest pain   Associated symptoms: no chills, no diaphoresis, no fever, no headaches, no myalgias, no rash, no rhinorrhea, no shortness of breath, no sinus congestion, no sore throat, no weight loss and no wheezing     Past Medical History:  Diagnosis Date  . Adenomatous colon polyp 2006  . Allergy   . Arthritis    low back  . Back pain 06/13/2011  . CAD (coronary artery disease) 06/12/2011  . Depression   . GERD (gastroesophageal reflux disease)   . Hiatal hernia   . Hyperlipidemia   . Hypertension   . Hypothyroidism 06/12/2011  . Leg fracture   . Pain in joint, ankle and foot 11/02/2012  . Pain of left heel 05/23/2012  . Skin lesions, generalized 05/23/2012  . Thyroid cancer ([place])   . Ulcer   . Vertigo 06/13/2011    Patient Active Problem List   Diagnosis Date Noted  . Long term (current) use of anticoagulants 09/22/2018  . Carotid artery stenosis 12/03/2016  . Dysuria 12/03/2016  . BMI 28.0-28.9,adult 05/06/2016  . Chest pain with high risk for cardiac etiology 01/24/2016  . Paroxysmal atrial fibrillation ([place]) 01/08/2016  . History of colonic polyps 12/12/2015  . Cryptogenic stroke ([place]) 09/13/2015  . Insomnia 08/10/2015  . TIA (transient ischemic attack) 08/09/2015  . Vitamin D deficiency 06/21/2015  . B12 deficiency 09/25/2013  . Cerebrovascular disease  06/13/2011  . Essential hypertension 06/13/2011  . Hyperlipidemia   . Postsurgical hypothyroidism   . Coronary artery disease of native artery of native heart with stable angina pectoris [facility])     Past Surgical History:  Procedure Laterality Date  . BALLOON ANGIOPLASTY, ARTERY    . CARDIAC CATHETERIZATION N/A 01/24/2016   Procedure: Left Heart Cath and Coronary Angiography;  Surgeon: Peter M Martinique, MD;  Location: [facility];  Service: Cardiovascular;  Laterality: N/A;  . EP IMPLANTABLE DEVICE N/A 09/24/2015   Procedure: Loop Recorder Insertion;  Surgeon: Sanda Klein, MD;  Location: [facility];  Service: Cardiovascular;  Laterality: N/A;  . FRACTURE SURGERY     left leg, pin and 5 bolts in place in lower tibia  . LESION REMOVAL     vaginal  . THYROIDECTOMY, PARTIAL     twice  . TUBAL LIGATION     growth removed from posterior vaginal vault     OB History   No obstetric history on file.      Home Medications    Prior to Admission medications   Medication Sig Start Date End Date Taking? Authorizing Provider  ALPRAZolam Duanne Moron) 0.5 MG tablet TAKE 1 TABLET BY MOUTH AT BEDTIME AS NEEDED FOR ANXIETY 02/06/19   Midge Minium, MD  aspirin EC 81 MG tablet Take 81 mg by mouth daily as needed (chest pain).     [provider]  atorvastatin (LIPITOR)  40 MG tablet Take 1 tablet (40 mg total) by mouth every evening. 02/27/19   Midge Minium, MD  ELIQUIS 5 MG TABS tablet TAKE 1 TABLET(5 MG) BY MOUTH TWICE DAILY Patient taking differently: Take 5 mg by mouth every evening.  07/29/18   Croitoru, Mihai, MD  levothyroxine (SYNTHROID) 112 MCG tablet TAKE 1 TABLET BY MOUTH EVERY DAY 02/03/19   Midge Minium, MD  losartan-hydrochlorothiazide (HYZAAR) 50-12.5 MG tablet TAKE 1 TABLET BY MOUTH EVERY DAY 01/18/19   Midge Minium, MD  metoprolol succinate (TOPROL-XL) 50 MG 24 hr tablet TAKE 1 TABLET EVERY DAY IMMEDIATELY FOLLOWING A MEAL Patient taking  differently: Take 50 mg by mouth every evening.  01/06/19   Croitoru, Mihai, MD  tiZANidine (ZANAFLEX) 4 MG tablet Take 1 tablet (4 mg total) by mouth every 8 (eight) hours as needed for muscle spasms. 02/01/19   Midge Minium, MD    Family History Family History  Problem Relation Age of Onset  . Cancer Mother        ovary/uterus-can't remember  . Stroke Mother   . Heart disease Father   . Heart disease Sister        bypass  . Stroke Sister   . Cancer Sister        LN, bone  . Hypertension Sister   . Heart disease Brother        bypass  . Cancer Cousin        breast    Social History Social History   Tobacco Use  . Smoking status: Former Research scientist (life sciences)  . Smokeless tobacco: Never Used  Substance Use Topics  . Alcohol use: No    Alcohol/week: 0.0 standard drinks  . Drug use: No     Allergies   Amoxicillin   Review of Systems Review of Systems  Constitutional: Positive for fatigue. Negative for chills, diaphoresis, fever and weight loss.  HENT: Negative for congestion, rhinorrhea and sore throat.   Eyes: Negative for photophobia.  Respiratory: Positive for cough. Negative for chest tightness, shortness of breath, wheezing and stridor.   Cardiovascular: Positive for chest pain. Negative for palpitations and leg swelling.  Gastrointestinal: Negative for constipation, diarrhea, nausea and vomiting.  Genitourinary: Positive for dysuria. Negative for flank pain and frequency.  Musculoskeletal: Negative for back pain (no back or lumbar pain), myalgias, neck pain and neck stiffness.  Skin: Negative for rash and wound.  Neurological: Positive for weakness (bilateral generalized leg weakness). Negative for dizziness, light-headedness and headaches.  Psychiatric/Behavioral: Negative for agitation.  All other systems reviewed and are negative.    Physical Exam Updated Vital Signs BP (!) 145/70   Pulse 71   Temp 98.4 F (36.9 C)   Resp 16   Ht 5\' 2"  (1.575 m)   Wt 71.7  kg   SpO2 100%   BMI 28.90 kg/m   Physical Exam Vitals signs and nursing note reviewed.  Constitutional:      General: She is not in acute distress.    Appearance: She is well-developed. She is not ill-appearing, toxic-appearing or diaphoretic.  HENT:     Head: Normocephalic and atraumatic.     Right Ear: External ear normal.     Left Ear: External ear normal.     Nose: Nose normal. No congestion or rhinorrhea.     Mouth/Throat:     Pharynx: No oropharyngeal exudate.  Eyes:     Conjunctiva/sclera: Conjunctivae normal.     Pupils: Pupils are equal, round, and  reactive to light.  Neck:     Musculoskeletal: Normal range of motion and neck supple. No muscular tenderness.  Cardiovascular:     Rate and Rhythm: Normal rate.     Pulses: Normal pulses.     Heart sounds: No murmur.  Pulmonary:     Effort: Pulmonary effort is normal. No respiratory distress.     Breath sounds: Normal breath sounds. No stridor. No wheezing, rhonchi or rales.  Chest:     Chest wall: Tenderness present.    Abdominal:     General: There is no distension.     Tenderness: There is no abdominal tenderness. There is no right CVA tenderness, left CVA tenderness, guarding or rebound.  Musculoskeletal:        General: No tenderness.  Skin:    General: Skin is warm.     Capillary Refill: Capillary refill takes less than 2 seconds.     Findings: No erythema or rash.  Neurological:     General: No focal deficit present.     Mental Status: She is alert and oriented to person, place, and time.     Cranial Nerves: No cranial nerve deficit.     Sensory: No sensory deficit.     Motor: No weakness or abnormal muscle tone.     Coordination: Coordination normal.     Deep Tendon Reflexes: Reflexes are normal and symmetric.      ED Treatments / Results  Labs (all labs ordered are listed, but only abnormal results are displayed) Labs Reviewed  COMPREHENSIVE METABOLIC PANEL - Abnormal; Notable for the following  components:      Result Value   Creatinine, Ser 1.21 (*)    GFR calc non Af Amer 42 (*)    GFR calc Af Amer 49 (*)    All other components within normal limits  BRAIN NATRIURETIC PEPTIDE - Abnormal; Notable for the following components:   B Natriuretic Peptide 140.8 (*)    All other components within normal limits  URINE CULTURE  SARS CORONAVIRUS 2  CBC WITH DIFFERENTIAL/PLATELET  TSH  URINALYSIS, ROUTINE W REFLEX MICROSCOPIC  MAGNESIUM  TROPONIN I (HIGH SENSITIVITY)  TROPONIN I (HIGH SENSITIVITY)    EKG None  Radiology Dg Chest Port 1 View  Result Date: 04/03/2019 CLINICAL DATA:  Hacking cough for 2 weeks. EXAM: PORTABLE CHEST 1 VIEW COMPARISON:  PA and lateral chest 04/03/2018. FINDINGS: Lungs clear. Heart size normal. No pneumothorax or pleural fluid. No acute or focal bony abnormality. Loop recorder noted. IMPRESSION: No acute disease. Electronically Signed   By: Inge Rise M.D.   On: 04/03/2019 14:43    Procedures Procedures (including critical care time)  Medications Ordered in ED Medications - No data to display   Initial Impression / Assessment and Plan / ED Course  I have reviewed the triage vital signs and the nursing notes.  Pertinent labs & imaging results that were available during my care of the patient were reviewed by me and considered in my medical decision making (see chart for details).        SKYLEN DANIELSEN is a 78 y.o. female with a past medical history sniffing for hypertension, CAD, stroke, atrial fibrillation on Eliquis therapy, prior TIA, postsurgical hypothyroidism, and hyperlipidemia who presents with 2 weeks of nonproductive cough, malaise, fatigue, intermittent chest pain, and dysuria.  Patient reports that she has had dysuria for "quite some time" and says that she was told she had a UTI but did not complete a course  of antibiotics.  She reports still having dysuria.  She is feeling very fatigued and last night her legs bilaterally felt  tired and fatigued prompting her to seek evaluation today.  She reports the weakness was bilateral and she denies any numbness.  She denies any shortness of breath at this time but reports he has had some intermittent chest pain when she is having severe coughing.  She denies any trauma.  She denies any nausea, vomiting, diaphoresis.  She denies any unilateral leg swelling.  She denies any constipation or diarrhea.  She denies any neurological planes otherwise.  On exam, lungs are clear.  Chest is tender to palpation on the right chest and it reproduces her discomfort with coughing.  Abdomen is nontender.  Patient moving all extremities.  No focal neurologic deficits.  No weakness or numbness in the legs.  Good pulses in all extremities.  Patient resting comfortably in room in no distress and with normal oxygen saturation on room air.  Patient is not tachycardic or hypotensive.  Clinically I suspect patient has a viral URI causing her symptoms.  I also suspect patient may have a urinary tract infection leading to her worsened fatigue and urinary symptoms.  She will have urinalysis and screening labs look for electrolyte imbalance.  We will send a TSH although it may not return today.  Will get chest x-ray and given her cardiac history, troponin and EKG with her chest discomfort.  Anticipate reassessment and discharge if work-up is reassuring.  We will send a tear to coronavirus test as I do not suspect she will need admission at this time but given the current pandemic and her cough, it is reasonable to check.  Family is on board with work-up at this time.  7:27 PM Patient's work-up was overall reassuring.  Troponin negative x2.  CBC and CMP showed a slight increase in creatinine but similar to prior.  X-ray shows no pneumonia or significant fluid.  BNP slightly elevated however clinically patient is not hypoxic and is not fluid overloaded.  Had a shared decision made conversation with family we agreed  for discharge home given the patient's well appearance and vital signs.  Patient will follow-up with PCP and will watch on my chart for the coronavirus test results tomorrow.  Patient and family understand return precautions and had no other questions or concerns.  Patient was discharged in good condition.   Final Clinical Impressions(s) / ED Diagnoses   Final diagnoses:  Cough  Fatigue, unspecified type  Malaise and fatigue    ED Discharge Orders    None      Clinical Impression: 1. Fatigue, unspecified type   2. Cough   3. Malaise and fatigue     Disposition: Discharge  Condition: Good  I have discussed the results, Dx and Tx plan with the pt(& family if present). He/she/they expressed understanding and agree(s) with the plan. Discharge instructions discussed at great length. Strict return precautions discussed and pt &/or family have verbalized understanding of the instructions. No further questions at time of discharge.    New Prescriptions   No medications on file    Follow Up: Midge Minium, MD [address] [place] [id number] [phone]     [place] [address] [id number] NO IBBC [place] [phone]        , Gwenyth Allegra, MD 04/03/19 [phone]

## 2019-04-03 NOTE — Discharge Instructions (Signed)
Your work-up today was overall reassuring.  We did not see any evidence of pneumonia on images.  Your thyroid was normal.  Your other labs were overall reassuring.  Your BNP was slightly elevated, please follow-up with your PCP for further monitoring of this.  If any symptoms change or worsen, please return to the nearest emergency department.

## 2019-04-03 NOTE — ED Notes (Signed)
ED Provider at bedside. 

## 2019-04-03 NOTE — ED Notes (Signed)
Pt denies body aches but reports her legs sometimes feel as if they would give out. Pt ambulatory to treatment room with steady gait. Pt daughter at bedside.

## 2019-04-03 NOTE — ED Notes (Signed)
Pt requesting update and to remove IV. EDP informed and will be at bedside soon. IV removed.

## 2019-04-04 LAB — URINE CULTURE

## 2019-04-06 ENCOUNTER — Telehealth: Payer: Self-pay

## 2019-04-06 NOTE — Progress Notes (Signed)
Carelink Summary Report / Loop Recorder 

## 2019-04-06 NOTE — Telephone Encounter (Signed)
Spoke with patient to f/u after ER visit. Stated that she is beginning to feel better, but still experiencing a cough. UTI symptoms have resolved on their own and her COVID test was negative. I offered patient a f/u appt with PCP or Einar Pheasant, patient denied. Stated she did not feel like she needed an appt currently, but would call back if symptoms return.

## 2019-04-09 ENCOUNTER — Other Ambulatory Visit: Payer: Self-pay | Admitting: Cardiovascular Disease

## 2019-04-15 ENCOUNTER — Other Ambulatory Visit: Payer: Self-pay | Admitting: Family Medicine

## 2019-04-15 DIAGNOSIS — I10 Essential (primary) hypertension: Secondary | ICD-10-CM

## 2019-04-18 ENCOUNTER — Telehealth: Payer: Self-pay | Admitting: *Deleted

## 2019-04-18 NOTE — Telephone Encounter (Signed)
LINQ at RRT as of 04/12/19.   Attempted to reach patient to discuss options at this time. No answer, phone rang continuously so unable to LM.

## 2019-04-19 NOTE — Telephone Encounter (Signed)
I let the pt know per Raquel Sarna, RN phone note that her Cruz Condon has reached that RRT as of 04/12/2019. I let the pt know she can have the option of keeping the Linq implanted or have it removed. The pt states she do wants the LINQ removed but she wants to have it done when Covid-19 is over. I told the pt I will order her a return kit to send the monitor back. The pt states she will call to schedule an appointment with the doctor when she wants it remove.

## 2019-04-19 NOTE — Telephone Encounter (Signed)
Thanks

## 2019-04-28 ENCOUNTER — Other Ambulatory Visit: Payer: Self-pay | Admitting: Cardiovascular Disease

## 2019-04-28 ENCOUNTER — Other Ambulatory Visit: Payer: Self-pay | Admitting: Family Medicine

## 2019-05-01 ENCOUNTER — Other Ambulatory Visit: Payer: Self-pay

## 2019-05-01 ENCOUNTER — Encounter: Payer: Self-pay | Admitting: Family Medicine

## 2019-05-01 ENCOUNTER — Ambulatory Visit (INDEPENDENT_AMBULATORY_CARE_PROVIDER_SITE_OTHER): Payer: Medicare Other | Admitting: Family Medicine

## 2019-05-01 VITALS — BP 122/69 | HR 59 | Temp 97.0°F | Resp 17 | Ht 62.0 in | Wt 159.1 lb

## 2019-05-01 DIAGNOSIS — E785 Hyperlipidemia, unspecified: Secondary | ICD-10-CM

## 2019-05-01 DIAGNOSIS — E890 Postprocedural hypothyroidism: Secondary | ICD-10-CM

## 2019-05-01 DIAGNOSIS — I639 Cerebral infarction, unspecified: Secondary | ICD-10-CM

## 2019-05-01 DIAGNOSIS — J301 Allergic rhinitis due to pollen: Secondary | ICD-10-CM

## 2019-05-01 DIAGNOSIS — E538 Deficiency of other specified B group vitamins: Secondary | ICD-10-CM | POA: Diagnosis not present

## 2019-05-01 DIAGNOSIS — I10 Essential (primary) hypertension: Secondary | ICD-10-CM | POA: Diagnosis not present

## 2019-05-01 DIAGNOSIS — E559 Vitamin D deficiency, unspecified: Secondary | ICD-10-CM | POA: Diagnosis not present

## 2019-05-01 DIAGNOSIS — Z23 Encounter for immunization: Secondary | ICD-10-CM

## 2019-05-01 DIAGNOSIS — J309 Allergic rhinitis, unspecified: Secondary | ICD-10-CM | POA: Insufficient documentation

## 2019-05-01 MED ORDER — CETIRIZINE HCL 10 MG PO TABS: 10.0000 mg | ORAL_TABLET | Freq: Every day | ORAL | 11 refills | Status: DC

## 2019-05-01 NOTE — Progress Notes (Signed)
   Subjective:    Patient ID: [name], female    DOB: 11/02/1940, 78 y.o.   MRN: [id number]  HPI HTN- chronic problem, on Losartan HCTZ 50/12.5mg  daily w/ good control.  Denies CP w/ exception of cough.  No SOB.  No HAs.  Hyperlipidemia- chronic problem, on Lipitor 40mg  daily.  No abd pain, N/V.  Hypothyroid- chronic problem, on Levothyroxine 186mcg daily.  Denies fatigue, changes to skin/hair/nails.  Overweight- pt's BMI 29.1  Chronic cough- pt was a short term smoker ~60 yrs ago.  Sister died of lung cancer.  Pt reports cough is dry, hacking cough multiple times daily.  No fevers or chills.  No weight loss.  sxs started over 1 month ago.  Pt was previously taking allergy shots.  Not taking any allergy medication at this time.  + nasal congestion.  'my nose just runs'.     Review of Systems For ROS see HPI     Objective:   Physical Exam Vitals signs reviewed.  Constitutional:      General: She is not in acute distress.    Appearance: Normal appearance. She is well-developed.  HENT:     Head: Normocephalic and atraumatic.  Eyes:     Conjunctiva/sclera: Conjunctivae normal.     Pupils: Pupils are equal, round, and reactive to light.  Neck:     Musculoskeletal: Normal range of motion and neck supple.     Thyroid: No thyromegaly.  Cardiovascular:     Rate and Rhythm: Normal rate and regular rhythm.     Heart sounds: Normal heart sounds. No murmur.  Pulmonary:     Effort: Pulmonary effort is normal. No respiratory distress.     Breath sounds: Normal breath sounds.  Abdominal:     General: There is no distension.     Palpations: Abdomen is soft.     Tenderness: There is no abdominal tenderness.  Lymphadenopathy:     Cervical: No cervical adenopathy.  Skin:    General: Skin is warm and dry.  Neurological:     General: No focal deficit present.     Mental Status: She is alert and oriented to person, place, and time.  Psychiatric:        Mood and Affect: Mood normal.         Behavior: Behavior normal.        Thought Content: Thought content normal.           Assessment & Plan:

## 2019-05-01 NOTE — Assessment & Plan Note (Signed)
Pt has hx of this.  Check labs and replete prn. 

## 2019-05-01 NOTE — Patient Instructions (Signed)
Follow up in 6 months to recheck BP and cholesterol We'll notify you of your lab results and make any changes if needed START the Cetirizine daily to improve nasal congestion and cough Continue to work on healthy diet and regular exercise- you can do it! Call with any questions or concerns Stay Safe!!

## 2019-05-01 NOTE — Assessment & Plan Note (Signed)
Currently asymptomatic.  Check labs.  Adjust meds prn

## 2019-05-01 NOTE — Assessment & Plan Note (Signed)
Chronic problem.  Tolerating statin w/o difficulty.  Check labs.  Adjust meds prn  

## 2019-05-01 NOTE — Assessment & Plan Note (Signed)
Deteriorated.  Pt previously had allergy shots but stopped them 'years ago'.  Is not currently taking any allergy medication but reports cough x1 month and constant runny nose.  Restart daily antihistamine.  If cough continues, will proceed w/ chest CT.  Pt expressed understanding and is in agreement w/ plan.

## 2019-05-02 LAB — HEPATIC FUNCTION PANEL
ALT: 25 U/L (ref 0–35)
AST: 34 U/L (ref 0–37)
Albumin: 4 g/dL (ref 3.5–5.2)
Alkaline Phosphatase: 75 U/L (ref 39–117)
Bilirubin, Direct: 0.1 mg/dL (ref 0.0–0.3)
Total Bilirubin: 0.6 mg/dL (ref 0.2–1.2)
Total Protein: 7 g/dL (ref 6.0–8.3)

## 2019-05-02 LAB — BASIC METABOLIC PANEL
BUN: 20 mg/dL (ref 6–23)
CO2: 30 mEq/L (ref 19–32)
Calcium: 9.7 mg/dL (ref 8.4–10.5)
Chloride: 100 mEq/L (ref 96–112)
Creatinine, Ser: 1.13 mg/dL (ref 0.40–1.20)
GFR: 46.31 mL/min — ABNORMAL LOW (ref >60.00)
Glucose, Bld: 81 mg/dL (ref 70–99)
Potassium: 4.3 mEq/L (ref 3.5–5.1)
Sodium: 138 mEq/L (ref 135–145)

## 2019-05-02 LAB — CBC WITH DIFFERENTIAL/PLATELET
Basophils Absolute: 0.1 10*3/uL (ref 0.0–0.1)
Basophils Relative: 1.9 % (ref 0.0–3.0)
Eosinophils Absolute: 0.2 10*3/uL (ref 0.0–0.7)
Eosinophils Relative: 3.2 % (ref 0.0–5.0)
HCT: 37 % (ref 36.0–46.0)
Hemoglobin: 12.7 g/dL (ref 12.0–15.0)
Lymphocytes Relative: 37.3 % (ref 12.0–46.0)
Lymphs Abs: 2.9 10*3/uL (ref 0.7–4.0)
MCHC: 34.3 g/dL (ref 30.0–36.0)
MCV: 88 fl (ref 78.0–100.0)
Monocytes Absolute: 0.7 10*3/uL (ref 0.1–1.0)
Monocytes Relative: 8.4 % (ref 3.0–12.0)
Neutro Abs: 3.9 10*3/uL (ref 1.4–7.7)
Neutrophils Relative %: 49.2 % (ref 43.0–77.0)
Platelets: 202 10*3/uL (ref 150.0–400.0)
RBC: 4.2 Mil/uL (ref 3.87–5.11)
RDW: 13.5 % (ref 11.5–15.5)
WBC: 7.9 10*3/uL (ref 4.0–10.5)

## 2019-05-02 LAB — LIPID PANEL
Cholesterol: 147 mg/dL (ref 0–200)
HDL: 48.7 mg/dL (ref >39.00)
LDL Cholesterol: 73 mg/dL (ref 0–99)
NonHDL: 98.19
Total CHOL/HDL Ratio: 3
Triglycerides: 126 mg/dL (ref 0.0–149.0)
VLDL: 25.2 mg/dL (ref 0.0–40.0)

## 2019-05-02 LAB — VITAMIN D 25 HYDROXY (VIT D DEFICIENCY, FRACTURES): VITD: 46.89 ng/mL (ref 30.00–100.00)

## 2019-05-02 LAB — TSH: TSH: 3.07 u[IU]/mL (ref 0.35–4.50)

## 2019-05-02 LAB — VITAMIN B12: Vitamin B-12: 172 pg/mL — ABNORMAL LOW (ref 211–911)

## 2019-05-08 ENCOUNTER — Ambulatory Visit (INDEPENDENT_AMBULATORY_CARE_PROVIDER_SITE_OTHER): Payer: Medicare Other

## 2019-05-08 ENCOUNTER — Other Ambulatory Visit: Payer: Self-pay

## 2019-05-08 DIAGNOSIS — E538 Deficiency of other specified B group vitamins: Secondary | ICD-10-CM | POA: Diagnosis not present

## 2019-05-08 MED ORDER — CYANOCOBALAMIN 1000 MCG/ML IJ SOLN
1000.0000 ug | Freq: Once | INTRAMUSCULAR | Status: AC
  Administered 2019-05-08: 1000 ug via INTRAMUSCULAR

## 2019-05-08 NOTE — Progress Notes (Addendum)
Colleen Wright 78 y.o. female presents to office today for her first B12 injection x 6 months per PCP, [name], MD. Administered CYANOCOBALAMIN 1,000 mcg IM left arm. Patient tolerated well and is aware to return in 1 month for 2nd injection.   Above order is mine.  [name], MD

## 2019-05-10 NOTE — Addendum Note (Signed)
Addended by: Midge Minium on: 05/10/2019 05:26 PM   Modules accepted: Level of Service

## 2019-05-27 ENCOUNTER — Other Ambulatory Visit: Payer: Self-pay | Admitting: Family Medicine

## 2019-05-29 NOTE — Telephone Encounter (Signed)
Last OV 05/01/19 Alprazolam last filled 02/06/19 #30 with 1 Atorvastatin last filled 02/27/19 #90 with 0

## 2019-06-12 ENCOUNTER — Ambulatory Visit (INDEPENDENT_AMBULATORY_CARE_PROVIDER_SITE_OTHER): Payer: Medicare Other

## 2019-06-12 ENCOUNTER — Other Ambulatory Visit: Payer: Self-pay

## 2019-06-12 DIAGNOSIS — E538 Deficiency of other specified B group vitamins: Secondary | ICD-10-CM

## 2019-06-12 MED ORDER — CYANOCOBALAMIN 1000 MCG/ML IJ SOLN
1000.0000 ug | Freq: Once | INTRAMUSCULAR | Status: AC
  Administered 2019-06-12: 15:00:00 1000 ug via INTRAMUSCULAR

## 2019-06-12 NOTE — Progress Notes (Addendum)
Colleen Wright 78 y.o. female presents to office today for her 2nd of 6 total B12 injections. Administered  CYANOCOBALAMIN 1000 mcg IM right arm per [name], MD. Patient tolerated well and is aware to return next month for her 3rd injection.   The above order is mine.  [name], MD

## 2019-06-15 NOTE — Addendum Note (Signed)
Addended by: Midge Minium on: 06/15/2019 05:00 PM   Modules accepted: Level of Service

## 2019-07-13 ENCOUNTER — Other Ambulatory Visit: Payer: Self-pay | Admitting: Cardiovascular Disease

## 2019-07-20 ENCOUNTER — Ambulatory Visit: Payer: Medicare Other

## 2019-07-24 ENCOUNTER — Other Ambulatory Visit: Payer: Self-pay

## 2019-07-24 ENCOUNTER — Ambulatory Visit (INDEPENDENT_AMBULATORY_CARE_PROVIDER_SITE_OTHER): Payer: Medicare Other

## 2019-07-24 DIAGNOSIS — E538 Deficiency of other specified B group vitamins: Secondary | ICD-10-CM | POA: Diagnosis not present

## 2019-07-24 MED ORDER — CYANOCOBALAMIN 1000 MCG/ML IJ SOLN
1000.0000 ug | Freq: Once | INTRAMUSCULAR | Status: AC
  Administered 2019-07-24: 1000 ug via INTRAMUSCULAR

## 2019-07-24 NOTE — Progress Notes (Signed)
Colleen Wright 78 y.o. female presents to office today for her 3rd of 6 B12 injections. Administered CYANOCOBALAMIN 1,000 mcg IM right arm. Patient tolerated well and is aware to return in 1 month for her fourth injection.

## 2019-07-28 ENCOUNTER — Other Ambulatory Visit: Payer: Self-pay | Admitting: Family Medicine

## 2019-07-28 DIAGNOSIS — I10 Essential (primary) hypertension: Secondary | ICD-10-CM

## 2019-08-03 ENCOUNTER — Telehealth: Payer: Self-pay

## 2019-08-03 ENCOUNTER — Other Ambulatory Visit: Payer: Self-pay | Admitting: Family Medicine

## 2019-08-03 ENCOUNTER — Other Ambulatory Visit: Payer: Self-pay | Admitting: Cardiovascular Disease

## 2019-08-03 DIAGNOSIS — R05 Cough: Secondary | ICD-10-CM

## 2019-08-03 DIAGNOSIS — I6523 Occlusion and stenosis of bilateral carotid arteries: Secondary | ICD-10-CM

## 2019-08-03 DIAGNOSIS — R059 Cough, unspecified: Secondary | ICD-10-CM

## 2019-08-03 NOTE — Telephone Encounter (Signed)
She is due for repeat carotid dopplers, dx carotid artery stenosis

## 2019-08-03 NOTE — Telephone Encounter (Signed)
Patient called in stating that the allergy medicine is not helping with her cough, it has gotten wrose. Wants to proceed with further imaging as suggested as previous appt. Please advise

## 2019-08-03 NOTE — Telephone Encounter (Signed)
Last OV 05/01/19 Alprazolam last filled 05/29/19 #30 with 1   Per note in pt chart, it states pt was suppose to have repeat carotids 02/15/19. I do not see where these were done. Please advise?

## 2019-08-04 NOTE — Telephone Encounter (Signed)
Prior to a CT scan, we would need to get a 2 view CXR to assess (dx chronic cough).  At that time, if we don't have any answers, we could proceed w/ CT

## 2019-08-04 NOTE — Telephone Encounter (Signed)
Called and spoke with pt. She did not want to have repeat imaging completed. She did agree to a pulmonology referral. This was placed for pt.

## 2019-08-04 NOTE — Addendum Note (Signed)
Addended by: Davis Gourd on: 08/04/2019 12:08 PM   Modules accepted: Orders

## 2019-08-16 ENCOUNTER — Ambulatory Visit
Admission: RE | Admit: 2019-08-16 | Discharge: 2019-08-16 | Disposition: A | Discharge status: Home / Self Care | Payer: Medicare Other | Source: Ambulatory Visit | Attending: Family Medicine | Admitting: Family Medicine

## 2019-08-16 DIAGNOSIS — I6523 Occlusion and stenosis of bilateral carotid arteries: Secondary | ICD-10-CM

## 2019-08-21 ENCOUNTER — Telehealth: Payer: Self-pay | Admitting: Family Medicine

## 2019-08-21 NOTE — Telephone Encounter (Signed)
I cannot do a CT or MRI of her chest.  She has not had a recent chest xray.  If she is having a chronic cough, it is necessary to see pulmonary and they can determine what imaging is needed

## 2019-08-21 NOTE — Telephone Encounter (Signed)
Please advise 

## 2019-08-21 NOTE — Telephone Encounter (Signed)
Pt states that she doesn't want to go see another doctor she just wants to have an MRI of the chest. I have her scheduled for Pulmonary on 08/23/19 but she said she may cancel the appt.

## 2019-08-22 NOTE — Telephone Encounter (Signed)
Called and spoke with pt. She states that she started mucinex and started feeling better so she canceled the appt with pulmonology.

## 2019-08-23 ENCOUNTER — Institutional Professional Consult (permissible substitution): Payer: Medicare Other | Admitting: Pulmonary Disease

## 2019-09-08 ENCOUNTER — Ambulatory Visit (INDEPENDENT_AMBULATORY_CARE_PROVIDER_SITE_OTHER): Payer: Medicare Other

## 2019-09-08 ENCOUNTER — Other Ambulatory Visit: Payer: Self-pay

## 2019-09-08 ENCOUNTER — Telehealth: Payer: Self-pay | Admitting: Family Medicine

## 2019-09-08 DIAGNOSIS — E538 Deficiency of other specified B group vitamins: Secondary | ICD-10-CM

## 2019-09-08 MED ORDER — CYANOCOBALAMIN 1000 MCG/ML IJ SOLN
1000.0000 ug | Freq: Once | INTRAMUSCULAR | Status: AC
  Administered 2019-09-08: 1000 ug via INTRAMUSCULAR

## 2019-09-08 NOTE — Telephone Encounter (Signed)
Pt called in asking if she is due for a b12 shot, please advise she would like to come in for this today.

## 2019-09-08 NOTE — Telephone Encounter (Signed)
Please advise 

## 2019-09-08 NOTE — Progress Notes (Signed)
Colleen Wright, 79 year old female presents to the office for b12 injection per [name], MD. Patient given CYANOCOBALAMIN 1000 mcg/mL in the left deltoid. Patient tolerated well and left the office in good condition. Patient was informed to return in 1 month for her next injection,.  The above order is mine.  [name], MD

## 2019-09-08 NOTE — Telephone Encounter (Signed)
Please advise. Patient has been non compliant with returning when her shots are due. She is 3 weeks overdue for her monthly injection.

## 2019-09-08 NOTE — Telephone Encounter (Signed)
[place] for B12 injection if she passes screening it will be 4/6

## 2019-09-08 NOTE — Telephone Encounter (Signed)
Ok to proceed w/ injxn today and use today's date to schedule next month's shot

## 2019-10-10 ENCOUNTER — Telehealth: Payer: Self-pay

## 2019-10-10 MED ORDER — VALACYCLOVIR HCL 1 G PO TABS: 1000.0000 mg | ORAL_TABLET | Freq: Three times a day (TID) | ORAL | 0 refills | Status: DC

## 2019-10-10 NOTE — Telephone Encounter (Signed)
Pt made aware Rx filled to pharmacy.

## 2019-10-10 NOTE — Telephone Encounter (Signed)
Patient states she believes she has shingles. She noticed it first on Saturday. Sunday she states it was painful. She has rash like areas under her right breast, middle of her stomach, and her back and right side. She states she has been using [place] shingles ointment that she purchased. She states she would like for you to call something in for her. Patient was offered an appointment but there are no more available for today. Please advise.

## 2019-10-10 NOTE — Telephone Encounter (Signed)
Ok for Valtrex 1000mg  TID x7 days, #21, no refills.  If no improvement, will need appt to assess

## 2019-10-10 NOTE — Telephone Encounter (Signed)
Please advise 

## 2019-10-12 ENCOUNTER — Other Ambulatory Visit: Payer: Self-pay | Admitting: Family Medicine

## 2019-10-12 NOTE — Telephone Encounter (Signed)
Last OV 05/01/19 Alprazolam last filled 08/03/19 #30 with 1

## 2019-10-13 ENCOUNTER — Ambulatory Visit: Payer: Medicare Other | Admitting: Cardiovascular Disease

## 2019-10-13 NOTE — Telephone Encounter (Signed)
Patient called back in wanting to know if PCP would send in a prescription "for a few pain pills" to help with her shingles. Informed patient that PCP had left for the day. States that she would go to UC if the pain got too bad.

## 2019-10-16 ENCOUNTER — Telehealth: Payer: Self-pay | Admitting: Family Medicine

## 2019-10-16 ENCOUNTER — Other Ambulatory Visit: Payer: Self-pay | Admitting: General Practice

## 2019-10-16 MED ORDER — GABAPENTIN 100 MG PO CAPS: 100.0000 mg | ORAL_CAPSULE | Freq: Three times a day (TID) | ORAL | 0 refills | Status: DC

## 2019-10-16 NOTE — Telephone Encounter (Signed)
Unfortunately another round of Valtrex will not help the situation.  We can add Gabapentin 100mg  TID, #45 to help w/ pain.  If possible, keep area clean and dry.  If not improving or signs of infxn, will need to schedule OV

## 2019-10-16 NOTE — Telephone Encounter (Signed)
Pt called in stating that she takes her last Valtrex tonight. She states that she still have a bad break out of shingles under her right breast that is very big and very blistered. She wanted to know if Dr. Birdie Riddle can send in something else for her to take to the [facility] in [place]. Please advise

## 2019-10-16 NOTE — Telephone Encounter (Signed)
Called and informed pt. I also advised her to stop using the "shingles Cream" to see if that helps them dry up. She was in agreement.

## 2019-10-16 NOTE — Telephone Encounter (Signed)
Please advise 

## 2019-10-28 ENCOUNTER — Other Ambulatory Visit: Payer: Self-pay | Admitting: Family Medicine

## 2019-10-28 DIAGNOSIS — I10 Essential (primary) hypertension: Secondary | ICD-10-CM

## 2019-10-30 ENCOUNTER — Ambulatory Visit (INDEPENDENT_AMBULATORY_CARE_PROVIDER_SITE_OTHER): Payer: Medicare Other

## 2019-10-30 ENCOUNTER — Other Ambulatory Visit: Payer: Self-pay

## 2019-10-30 ENCOUNTER — Ambulatory Visit: Payer: Medicare Other

## 2019-10-30 ENCOUNTER — Telehealth: Payer: Self-pay | Admitting: Family Medicine

## 2019-10-30 DIAGNOSIS — M545 Low back pain, unspecified: Secondary | ICD-10-CM

## 2019-10-30 DIAGNOSIS — R300 Dysuria: Secondary | ICD-10-CM

## 2019-10-30 DIAGNOSIS — R05 Cough: Secondary | ICD-10-CM

## 2019-10-30 DIAGNOSIS — E538 Deficiency of other specified B group vitamins: Secondary | ICD-10-CM | POA: Diagnosis not present

## 2019-10-30 DIAGNOSIS — R059 Cough, unspecified: Secondary | ICD-10-CM

## 2019-10-30 LAB — POCT URINALYSIS DIPSTICK
Bilirubin, UA: NEGATIVE
Blood, UA: NEGATIVE
Glucose, UA: NEGATIVE
Ketones, UA: NEGATIVE
Nitrite, UA: POSITIVE
Protein, UA: NEGATIVE
Spec Grav, UA: 1.02 (ref 1.010–1.025)
Urobilinogen, UA: 0.2 E.U./dL
pH, UA: 6 (ref 5.0–8.0)

## 2019-10-30 MED ORDER — CYANOCOBALAMIN 1000 MCG/ML IJ SOLN
1000.0000 ug | Freq: Once | INTRAMUSCULAR | Status: AC
  Administered 2019-10-30: 1000 ug via INTRAMUSCULAR

## 2019-10-30 NOTE — Telephone Encounter (Signed)
Please advise 

## 2019-10-30 NOTE — Telephone Encounter (Signed)
Called and scheduled pt for nurse visit for today for B12 and POCT UA/Culture. Pt also asked if ok to place referral to pulm again due to cough and pain since. I placed this for her.

## 2019-10-30 NOTE — Telephone Encounter (Signed)
It would make more sense for pt to keep her B12 appt today and give the urine while she's here so we will have the results of her culture at the time of her visit on Wednesday

## 2019-10-30 NOTE — Progress Notes (Addendum)
Colleen Wright, 79 year old female presents to the office for b12 injection per [name], MD. Patient given CYANOCOBALAMIN 1000 mcg/mL in the right deltoid. Patient tolerated well and left the office in good condition. Patient was informed to return in 1 month for her next injection,.  The above order is mine.  [name], MD

## 2019-10-30 NOTE — Progress Notes (Signed)
Called pt and lmovm to return call.

## 2019-10-30 NOTE — Telephone Encounter (Signed)
Pt called stating she has been experiencing back pain and would like to get a urine culture done to check her kidneys. Pt has an appt today 10/30/2019 for a B12 shot. Schedule appt to be seen for back pain on Wednesday 11/01/2019. Pt asked if she could get her B12 shot during the appointment scheduled for her back. Please advise for an OK for in office visit.

## 2019-10-31 ENCOUNTER — Other Ambulatory Visit: Payer: Self-pay | Admitting: General Practice

## 2019-10-31 LAB — URINE CULTURE
MICRO NUMBER:: 10251894
SPECIMEN QUALITY:: ADEQUATE

## 2019-10-31 MED ORDER — CEPHALEXIN 500 MG PO CAPS: 500.0000 mg | ORAL_CAPSULE | Freq: Two times a day (BID) | ORAL | 0 refills | Status: DC

## 2019-11-01 ENCOUNTER — Ambulatory Visit: Payer: Medicare Other | Admitting: Family Medicine

## 2019-11-01 ENCOUNTER — Encounter: Payer: Medicare Other | Admitting: Family Medicine

## 2019-11-06 ENCOUNTER — Telehealth: Payer: Self-pay | Admitting: Family Medicine

## 2019-11-06 NOTE — Chronic Care Management (AMB) (Signed)
  Chronic Care Management   Note  11/06/2019 Name: Colleen Wright MRN: [id number] DOB: May 18, 1941  Colleen Wright is a 79 y.o. year old female who is a primary care patient of Birdie Riddle, Aundra Millet, MD. I reached out to Colleen Wright by phone today in response to a referral sent by [name]'s PCP, Midge Minium, MD.   [name] was given information about Chronic Care Management services today including:  1. CCM service includes personalized support from designated clinical staff supervised by her physician, including individualized plan of care and coordination with other care providers 2. 24/7 contact phone numbers for assistance for urgent and routine care needs. 3. Service will only be billed when office clinical staff spend 20 minutes or more in a month to coordinate care. 4. Only one practitioner may furnish and bill the service in a calendar month. 5. The patient may stop CCM services at any time (effective at the end of the month) by phone call to the office staff.   Patient agreed to services and verbal consent obtained.   Follow up plan:   [place]

## 2019-11-08 NOTE — Addendum Note (Signed)
Addended by: Midge Minium on: 11/08/2019 12:37 PM   Modules accepted: Level of Service

## 2019-11-09 ENCOUNTER — Ambulatory Visit: Payer: Medicare Other

## 2019-11-09 DIAGNOSIS — E785 Hyperlipidemia, unspecified: Secondary | ICD-10-CM

## 2019-11-09 DIAGNOSIS — I10 Essential (primary) hypertension: Secondary | ICD-10-CM

## 2019-11-09 NOTE — Progress Notes (Signed)
Chronic Care Management Pharmacy  Name: Colleen Wright  MRN: [id number] DOB: [birth year]   Chief Complaint/ HPI Colleen Wright,  79 y.o. , female presents for their Initial CCM visit with the clinical pharmacist via telephone due to COVID-19 Pandemic.  PCP : Midge Minium, MD  Their chronic conditions include: HTN, HLD  Office Visits: -1/22 (PCP): B12 deficiency; received injection -9/14 (PCP): Pt reported cough x 1 month, runny nose. Restarted on cetirizine 10 mg daily  Consult/ED Visit: -8/17 [facility] ED): c/o cough, fatigue. Suspected viral uti  Medications: Outpatient Encounter Medications as of 11/09/2019  Medication Sig  . ALPRAZolam (XANAX) 0.5 MG tablet TAKE 1 TABLET BY MOUTH AT BEDTIME AS NEEDED FOR ANXIETY  . atorvastatin (LIPITOR) 40 MG tablet TAKE 1 TABLET BY MOUTH EVERY DAY IN THE EVENING  . ELIQUIS 5 MG TABS tablet TAKE 1 TABLET BY MOUTH TWICE A DAY  . gabapentin (NEURONTIN) 100 MG capsule Take 1 capsule (100 mg total) by mouth 3 (three) times daily.  Marland Kitchen levothyroxine (SYNTHROID) 112 MCG tablet TAKE 1 TABLET BY MOUTH EVERY DAY  . losartan-hydrochlorothiazide (HYZAAR) 50-12.5 MG tablet TAKE 1 TABLET BY MOUTH EVERY DAY  . metoprolol succinate (TOPROL-XL) 50 MG 24 hr tablet TAKE 1 TABLET BY MOUTH IMMEDIATELY FOLLOWING A MEAL  . aspirin EC 81 MG tablet Take 81 mg by mouth daily as needed (chest pain).   . cephALEXin (KEFLEX) 500 MG capsule Take 1 capsule (500 mg total) by mouth 2 (two) times daily. (Patient not taking: Reported on 11/09/2019)  . cetirizine (ZYRTEC) 10 MG tablet Take 1 tablet (10 mg total) by mouth daily. (Patient not taking: Reported on 11/09/2019)  . tiZANidine (ZANAFLEX) 4 MG tablet Take 1 tablet (4 mg total) by mouth every 8 (eight) hours as needed for muscle spasms. (Patient not taking: Reported on 11/09/2019)  . valACYclovir (VALTREX) 1000 MG tablet Take 1 tablet (1,000 mg total) by mouth 3 (three) times daily. (Patient not taking: Reported on 11/09/2019)    No facility-administered encounter medications on file as of 11/09/2019.   Current Diagnosis/Assessment:  Goals Addressed            This Visit's Progress   . PharmD Care Plan       CARE PLAN ENTRY  Current Barriers:  . Chronic Disease Management support, education, and care coordination needs related to HTN and HLD  Pharmacist Clinical Goal(s):  Marland Kitchen Over the next 14 days, patient will demonstrate Improved medication adherence as evidenced by use of adherence packaging . BP <130/80 . LDL <70   Interventions: . Comprehensive medication review performed.  Patient Self Care Activities:  . Patient verbalizes understanding of plan to follow-up with any questions related to medications  Initial goal documentation       Hypertension  Office blood pressures are  BP Readings from Last 3 Encounters:  05/01/19 122/69  04/03/19 (!) 174/75  09/22/18 120/66  GFR: 46.31 (04/2019), 48.85 (8/29)   Patient is currently controlled on the following medications: metoprolol 50 mg ER daily and losartan-hctz daily. Denies any recent falls, dizziness or low blood pressure when testing at home. Recently had battery die on home BP machine, encouraged to replace so she can test when needed. We discussed blood pressure control and how good control can help prevent strokes and heart events. Patient does not follow a diet, rather does best to portion control.   Patient checks BP at home only when feeling symptomatic.  Plan Continue current medications. Recommend replacing  batteries in home BP cuff to enable testing.      Hyperlipidemia   Lipid Panel     Component Value Date/Time   CHOL 147 05/01/2019 1439   TRIG 126.0 05/01/2019 1439   HDL 48.70 05/01/2019 1439   CHOLHDL 3 05/01/2019 1439   VLDL 25.2 05/01/2019 1439   LDLCALC 73 05/01/2019 1439    The ASCVD Risk score (Goff DC Jr., et al., 2013) failed to calculate for the following reasons:   The 2013 ASCVD risk score is only valid for  ages 66 to 23   The patient has a prior MI or stroke diagnosis   Patient is currently controlled on the following medications: atorvastatin 40mg  daily. Patient inquired on purpose of medication. Was counseled at length regarding potential benefit of medication and appropriate use. No concern for non-adherence at this time.    Plan Continue current medications   Vaccines  Reviewed and discussed patient's vaccination history. Up to date on routine vaccines. Has not received COVID vaccine, waiting on J&J/Janssen single-dose vaccine to become available. Not open to receiving two-dose series.   Immunization History  Administered Date(s) Administered  . Fluad Quad(high Dose 65+) 05/01/2019  . Influenza Whole 05/15/2011  . Influenza, High Dose Seasonal PF 04/14/2018  . Influenza,inj,Quad PF,6+ Mos 04/15/2014, 06/21/2015, 07/05/2016, 06/04/2017  . Pneumococcal Conjugate-13 06/21/2015  . Pneumococcal Polysaccharide-23 09/25/2013   Plan Recommended patient receive COVID vaccine once her preferred vaccine becomes available.  Hypothyroidism  Patient denies any disease or medication related symptoms or side effects. Most recent thyroid lab reviewed. TSH within normal range.   TSH  Date Value Ref Range Status  05/01/2019 3.07 0.35 - 4.50 uIU/mL Final    Patient is currently controlled on the following medications: levothyroxine 112 mcg.   Plan  Continue current medications  Medication Management  . Patient expresses concern with cost of Eliquis. Patient assistance should be considered.  . Patient uses weekly pill boxes to organize medications. Would like to received [facility], [facility], pill packaging services and [facility].  Plan . Will pursue patient assistance application for Eliquis.  Marland Kitchen Utilize [facility] for medication synchronization, packaging and delivery. _____  Ms. Heiland was given information about Chronic Care Management services  today including:  1. CCM service includes personalized support from designated clinical staff supervised by her physician, including individualized plan of care and coordination with other care providers 2. 24/7 contact phone numbers for assistance for urgent and routine care needs. 3. Standard insurance, coinsurance, copays and deductibles apply for chronic care management only during months in which we provide at least 20 minutes of these services. Most insurances cover these services at 100%, however patients may be responsible for any copay, coinsurance and/or deductible if applicable. This service may help you avoid the need for more expensive face-to-face services. 4. Only one practitioner may furnish and bill the service in a calendar month. 5. The patient may stop CCM services at any time (effective at the end of the month) by phone call to the office staff.  Patient agreed to services and verbal consent obtained  Verbal consent obtained for [facility] enhanced pharmacy services (medication synchronization, adherence packaging, delivery coordination). A medication sync plan was created to allow patient to get all medications delivered once every 30 to 90 days per patient preference. Patient understands they have freedom to choose pharmacy and clinical pharmacist will coordinate care between all prescribers and [facility].  Madelin Rear, Pharm.D. Clinical Pharmacist [place]  [facility] [phone]

## 2019-11-09 NOTE — Patient Instructions (Addendum)
Visit Information  Goals Addressed            This Visit's Progress   . PharmD Care Plan       CARE PLAN ENTRY  Current Barriers:  . Chronic Disease Management support, education, and care coordination needs related to HTN and HLD  Pharmacist Clinical Goal(s):  Marland Kitchen Over the next 14 days, patient will demonstrate Improved medication adherence as evidenced by use of adherence packaging . BP <130/80 . LDL <70   Interventions: . Comprehensive medication review performed.  Patient Self Care Activities:  . Patient verbalizes understanding of plan to follow-up with any questions related to medications  Initial goal documentation        Colleen Wright was given information about Chronic Care Management services today including:  1. CCM service includes personalized support from designated clinical staff supervised by her physician, including individualized plan of care and coordination with other care providers 2. 24/7 contact phone numbers for assistance for urgent and routine care needs. 3. Standard insurance, coinsurance, copays and deductibles apply for chronic care management only during months in which we provide at least 20 minutes of these services. Most insurances cover these services at 100%, however patients may be responsible for any copay, coinsurance and/or deductible if applicable. This service may help you avoid the need for more expensive face-to-face services. 4. Only one practitioner may furnish and bill the service in a calendar month. 5. The patient may stop CCM services at any time (effective at the end of the month) by phone call to the office staff.  Patient agreed to services and verbal consent obtained.   The patient verbalized understanding of instructions provided today and agreed to receive a mailed copy of patient instruction and/or educational materials. Face to Face appointment with pharmacist scheduled for: 06/24 2pm  Madelin Rear, Pharm.D. Clinical  Pharmacist [facility] [phone]  High Cholesterol  High cholesterol is a condition in which the blood has high levels of a white, waxy, fat-like substance (cholesterol). The human body needs small amounts of cholesterol. The liver makes all the cholesterol that the body needs. Extra (excess) cholesterol comes from the food that we eat. Cholesterol is carried from the liver by the blood through the blood vessels. If you have high cholesterol, deposits (plaques) may build up on the walls of your blood vessels (arteries). Plaques make the arteries narrower and stiffer. Cholesterol plaques increase your risk for heart attack and stroke. Work with your health care provider to keep your cholesterol levels in a healthy range. What increases the risk? This condition is more likely to develop in people who:  Eat foods that are high in animal fat (saturated fat) or cholesterol.  Are overweight.  Are not getting enough exercise.  Have a family history of high cholesterol. What are the signs or symptoms? There are no symptoms of this condition. How is this diagnosed? This condition may be diagnosed from the results of a blood test.  If you are older than age 34, your health care provider may check your cholesterol every 4-6 years.  You may be checked more often if you already have high cholesterol or other risk factors for heart disease. The blood test for cholesterol measures:  "Bad" cholesterol (LDL cholesterol). This is the main type of cholesterol that causes heart disease. The desired level for LDL is less than 100.  "Good" cholesterol (HDL cholesterol). This type helps to protect against heart disease by cleaning the  arteries and carrying the LDL away. The desired level for HDL is 60 or higher.  Triglycerides. These are fats that the body can store or burn for energy. The desired number for triglycerides is lower than 150.  Total cholesterol. This is  a measure of the total amount of cholesterol in your blood, including LDL cholesterol, HDL cholesterol, and triglycerides. A healthy number is less than 200. How is this treated? This condition is treated with diet changes, lifestyle changes, and medicines. Diet changes  This may include eating more whole grains, fruits, vegetables, nuts, and fish.  This may also include cutting back on red meat and foods that have a lot of added sugar. Lifestyle changes  Changes may include getting at least 40 minutes of aerobic exercise 3 times a week. Aerobic exercises include walking, biking, and swimming. Aerobic exercise along with a healthy diet can help you maintain a healthy weight.  Changes may also include quitting smoking. Medicines  Medicines are usually given if diet and lifestyle changes have failed to reduce your cholesterol to healthy levels.  Your health care provider may prescribe a statin medicine. Statin medicines have been shown to reduce cholesterol, which can reduce the risk of heart disease. Follow these instructions at home: Eating and drinking If told by your health care provider:  Eat chicken (without skin), fish, veal, shellfish, ground [place] breast, and round or loin cuts of red meat.  Do not eat fried foods or fatty meats, such as hot dogs and salami.  Eat plenty of fruits, such as apples.  Eat plenty of vegetables, such as broccoli, potatoes, and carrots.  Eat beans, peas, and lentils.  Eat grains such as barley, rice, couscous, and bulgur wheat.  Eat pasta without cream sauces.  Use skim or nonfat milk, and eat low-fat or nonfat yogurt and cheeses.  Do not eat or drink whole milk, cream, ice cream, egg yolks, or hard cheeses.  Do not eat stick margarine or tub margarines that contain trans fats (also called partially hydrogenated oils).  Do not eat saturated tropical oils, such as coconut oil and palm oil.  Do not eat cakes, cookies, crackers, or other  baked goods that contain trans fats.  General instructions  Exercise as directed by your health care provider. Increase your activity level with activities such as gardening, walking, and taking the stairs.  Take over-the-counter and prescription medicines only as told by your health care provider.  Do not use any products that contain nicotine or tobacco, such as cigarettes and e-cigarettes. If you need help quitting, ask your health care provider.  Keep all follow-up visits as told by your health care provider. This is important. Contact a health care provider if:  You are struggling to maintain a healthy diet or weight.  You need help to start on an exercise program.  You need help to stop smoking. Get help right away if:  You have chest pain.  You have trouble breathing. This information is not intended to replace advice given to you by your health care provider. Make sure you discuss any questions you have with your health care provider. Document Revised: 08/06/2017 Document Reviewed: 02/01/2016 Elsevier Patient Education  [place].

## 2019-11-10 ENCOUNTER — Ambulatory Visit: Payer: Medicare Other | Admitting: Cardiovascular Disease

## 2019-11-10 ENCOUNTER — Other Ambulatory Visit: Payer: Self-pay | Admitting: General Practice

## 2019-11-10 DIAGNOSIS — I10 Essential (primary) hypertension: Secondary | ICD-10-CM

## 2019-11-10 MED ORDER — LEVOTHYROXINE SODIUM 112 MCG PO TABS: 112.0000 ug | ORAL_TABLET | Freq: Every day | ORAL | 1 refills | Status: DC

## 2019-11-10 MED ORDER — ALPRAZOLAM 0.5 MG PO TABS: 0.5000 mg | ORAL_TABLET | Freq: Every day | ORAL | 1 refills | Status: DC

## 2019-11-10 MED ORDER — LOSARTAN POTASSIUM-HCTZ 50-12.5 MG PO TABS: 1.0000 | ORAL_TABLET | Freq: Every day | ORAL | 1 refills | Status: DC

## 2019-11-10 MED ORDER — ATORVASTATIN CALCIUM 40 MG PO TABS: ORAL_TABLET | ORAL | 1 refills | Status: DC

## 2019-11-10 NOTE — Telephone Encounter (Signed)
-----   Message from Madelin Rear, [facility] sent at 11/10/2019  2:04 PM EDT ----- Iona Coach!  I was wondering if you could have some new prescriptions for Ms. Goya sent into [facility], as this will be her new preferred pharmacy. I should be able to help with sending in prescriptions in the future but my clearance is still pending unfortunately. Please let me know if you need any additional information.  She has requested: -Alprazolam 0.5 mg -Atorvastatin 40 mg,  -Levothyroxine 112 mcg,  -Losartan-HCTZ 50-12.5 mg   I believe her Eliquis and metoprolol are handled by her cardiologist Dr. Sallyanne Kuster.  Thanks for your help! Edison Nasuti

## 2019-11-10 NOTE — Telephone Encounter (Signed)
Last OV 05/01/19 Alprazolam last filled 10/12/19 #30 with 1

## 2019-11-13 ENCOUNTER — Other Ambulatory Visit: Payer: Self-pay | Admitting: Family Medicine

## 2019-11-13 ENCOUNTER — Other Ambulatory Visit: Payer: Self-pay | Admitting: Cardiovascular Disease

## 2019-11-13 MED ORDER — APIXABAN 5 MG PO TABS: 5.0000 mg | ORAL_TABLET | Freq: Two times a day (BID) | ORAL | 1 refills | Status: DC

## 2019-11-13 NOTE — Patient Instructions (Signed)
Visit Information  Goals Addressed   None     Ms. Keatley was given information about Chronic Care Management services today including:  1. CCM service includes personalized support from designated clinical staff supervised by her physician, including individualized plan of care and coordination with other care providers 2. 24/7 contact phone numbers for assistance for urgent and routine care needs. 3. Standard insurance, coinsurance, copays and deductibles apply for chronic care management only during months in which we provide at least 20 minutes of these services. Most insurances cover these services at 100%, however patients may be responsible for any copay, coinsurance and/or deductible if applicable. This service may help you avoid the need for more expensive face-to-face services. 4. Only one practitioner may furnish and bill the service in a calendar month. 5. The patient may stop CCM services at any time (effective at the end of the month) by phone call to the office staff.  Patient agreed to services and verbal consent obtained.

## 2019-11-13 NOTE — Patient Instructions (Signed)
Visit Information  Goals Addressed   None     Colleen Wright was given information about Chronic Care Management services today including:  1. CCM service includes personalized support from designated clinical staff supervised by her physician, including individualized plan of care and coordination with other care providers 2. 24/7 contact phone numbers for assistance for urgent and routine care needs. 3. Standard insurance, coinsurance, copays and deductibles apply for chronic care management only during months in which we provide at least 20 minutes of these services. Most insurances cover these services at 100%, however patients may be responsible for any copay, coinsurance and/or deductible if applicable. This service may help you avoid the need for more expensive face-to-face services. 4. Only one practitioner may furnish and bill the service in a calendar month. 5. The patient may stop CCM services at any time (effective at the end of the month) by phone call to the office staff.

## 2019-11-13 NOTE — Telephone Encounter (Signed)
*  STAT* If patient is at the pharmacy, call can be transferred to refill team.   1. Which medications need to be refilled? (please list name of each medication and dose if known)  ELIQUIS 5 MG TABS tablet metoprolol succinate (TOPROL-XL) 50 MG 24 hr tablet  2. Which pharmacy/location (including street and city if local pharmacy) is medication to be sent to?  [facility] - [place], [place] - [place] [address]  3. Do they need a 30 day or 90 day supply? 90   Pharmacy needs new rx sent to them. The patient has not had these meds filled at their location

## 2019-11-22 ENCOUNTER — Telehealth: Payer: Self-pay | Admitting: Family Medicine

## 2019-11-22 NOTE — Progress Notes (Signed)
  Chronic Care Management   Note  11/22/2019 Name: SHATIRA POPPA MRN: [id number] DOB: 03-25-1941  [name] is a 79 y.o. year old female who is a primary care patient of Birdie Riddle, Aundra Millet, MD. I reached out to [name] by phone today in response to a referral sent by Ms. Lemmie Evens Tamez's PCP, Midge Minium, MD.   Ms. Bambach was given information about Chronic Care Management services today including:  1. CCM service includes personalized support from designated clinical staff supervised by her physician, including individualized plan of care and coordination with other care providers 2. 24/7 contact phone numbers for assistance for urgent and routine care needs. 3. Service will only be billed when office clinical staff spend 20 minutes or more in a month to coordinate care. 4. Only one practitioner may furnish and bill the service in a calendar month. 5. The patient may stop CCM services at any time (effective at the end of the month) by phone call to the office staff.   Patient did not agree to services and wishes to consider information provided before deciding about enrollment in care management services.   Follow up plan:   Earney [facility] Sched[facility]

## 2019-11-25 ENCOUNTER — Other Ambulatory Visit: Payer: Self-pay | Admitting: Family Medicine

## 2019-11-27 ENCOUNTER — Ambulatory Visit: Payer: Medicare Other | Admitting: Family Medicine

## 2019-11-28 ENCOUNTER — Ambulatory Visit (INDEPENDENT_AMBULATORY_CARE_PROVIDER_SITE_OTHER): Payer: Medicare Other | Admitting: Family Medicine

## 2019-11-28 ENCOUNTER — Ambulatory Visit (INDEPENDENT_AMBULATORY_CARE_PROVIDER_SITE_OTHER): Payer: Medicare Other | Admitting: Internal Medicine

## 2019-11-28 ENCOUNTER — Encounter: Payer: Self-pay | Admitting: Internal Medicine

## 2019-11-28 ENCOUNTER — Encounter: Payer: Self-pay | Admitting: Family Medicine

## 2019-11-28 ENCOUNTER — Other Ambulatory Visit: Payer: Self-pay

## 2019-11-28 VITALS — BP 114/65 | HR 75 | Temp 98.0°F | Resp 16 | Ht 62.0 in | Wt 162.4 lb

## 2019-11-28 VITALS — BP 150/70 | HR 63 | Temp 97.1°F | Ht 62.0 in | Wt 162.0 lb

## 2019-11-28 DIAGNOSIS — E663 Overweight: Secondary | ICD-10-CM | POA: Diagnosis not present

## 2019-11-28 DIAGNOSIS — I480 Paroxysmal atrial fibrillation: Secondary | ICD-10-CM

## 2019-11-28 DIAGNOSIS — I25118 Atherosclerotic heart disease of native coronary artery with other forms of angina pectoris: Secondary | ICD-10-CM

## 2019-11-28 DIAGNOSIS — E538 Deficiency of other specified B group vitamins: Secondary | ICD-10-CM

## 2019-11-28 DIAGNOSIS — R05 Cough: Secondary | ICD-10-CM

## 2019-11-28 DIAGNOSIS — E785 Hyperlipidemia, unspecified: Secondary | ICD-10-CM | POA: Diagnosis not present

## 2019-11-28 DIAGNOSIS — I10 Essential (primary) hypertension: Secondary | ICD-10-CM | POA: Diagnosis not present

## 2019-11-28 DIAGNOSIS — J310 Chronic rhinitis: Secondary | ICD-10-CM | POA: Diagnosis not present

## 2019-11-28 DIAGNOSIS — E559 Vitamin D deficiency, unspecified: Secondary | ICD-10-CM | POA: Diagnosis not present

## 2019-11-28 DIAGNOSIS — R053 Chronic cough: Secondary | ICD-10-CM

## 2019-11-28 LAB — CBC WITH DIFFERENTIAL/PLATELET
Basophils Absolute: 0 10*3/uL (ref 0.0–0.1)
Basophils Relative: 0.4 % (ref 0.0–3.0)
Eosinophils Absolute: 0.3 10*3/uL (ref 0.0–0.7)
Eosinophils Relative: 4.8 % (ref 0.0–5.0)
HCT: 37.3 % (ref 36.0–46.0)
Hemoglobin: 12.6 g/dL (ref 12.0–15.0)
Lymphocytes Relative: 33.5 % (ref 12.0–46.0)
Lymphs Abs: 1.8 10*3/uL (ref 0.7–4.0)
MCHC: 33.9 g/dL (ref 30.0–36.0)
MCV: 89.8 fl (ref 78.0–100.0)
Monocytes Absolute: 0.4 10*3/uL (ref 0.1–1.0)
Monocytes Relative: 8.4 % (ref 3.0–12.0)
Neutro Abs: 2.8 10*3/uL (ref 1.4–7.7)
Neutrophils Relative %: 52.9 % (ref 43.0–77.0)
Platelets: 167 10*3/uL (ref 150.0–400.0)
RBC: 4.15 Mil/uL (ref 3.87–5.11)
RDW: 14 % (ref 11.5–15.5)
WBC: 5.3 10*3/uL (ref 4.0–10.5)

## 2019-11-28 LAB — BASIC METABOLIC PANEL
BUN: 22 mg/dL (ref 6–23)
CO2: 28 mEq/L (ref 19–32)
Calcium: 9.2 mg/dL (ref 8.4–10.5)
Chloride: 101 mEq/L (ref 96–112)
Creatinine, Ser: 1.33 mg/dL — ABNORMAL HIGH (ref 0.40–1.20)
GFR: 38.31 mL/min — ABNORMAL LOW (ref >60.00)
Glucose, Bld: 101 mg/dL — ABNORMAL HIGH (ref 70–99)
Potassium: 3.7 mEq/L (ref 3.5–5.1)
Sodium: 138 mEq/L (ref 135–145)

## 2019-11-28 LAB — HEPATIC FUNCTION PANEL
ALT: 30 U/L (ref 0–35)
AST: 37 U/L (ref 0–37)
Albumin: 4 g/dL (ref 3.5–5.2)
Alkaline Phosphatase: 78 U/L (ref 39–117)
Bilirubin, Direct: 0.1 mg/dL (ref 0.0–0.3)
Total Bilirubin: 0.7 mg/dL (ref 0.2–1.2)
Total Protein: 6.7 g/dL (ref 6.0–8.3)

## 2019-11-28 LAB — LIPID PANEL
Cholesterol: 136 mg/dL (ref 0–200)
HDL: 40.6 mg/dL (ref >39.00)
LDL Cholesterol: 74 mg/dL (ref 0–99)
NonHDL: 95.64
Total CHOL/HDL Ratio: 3
Triglycerides: 108 mg/dL (ref 0.0–149.0)
VLDL: 21.6 mg/dL (ref 0.0–40.0)

## 2019-11-28 LAB — VITAMIN B12: Vitamin B-12: 338 pg/mL (ref 211–911)

## 2019-11-28 LAB — VITAMIN D 25 HYDROXY (VIT D DEFICIENCY, FRACTURES): VITD: 30.78 ng/mL (ref 30.00–100.00)

## 2019-11-28 LAB — TSH: TSH: 5.19 u[IU]/mL — ABNORMAL HIGH (ref 0.35–4.50)

## 2019-11-28 MED ORDER — CYANOCOBALAMIN 1000 MCG/ML IJ SOLN
1000.0000 ug | Freq: Once | INTRAMUSCULAR | Status: AC
  Administered 2019-11-28: 1000 ug via INTRAMUSCULAR

## 2019-11-28 MED ORDER — FLUTICASONE PROPIONATE 50 MCG/ACT NA SUSP: 1.0000 | Freq: Two times a day (BID) | NASAL | 5 refills | Status: DC

## 2019-11-28 MED ORDER — CETIRIZINE HCL 10 MG PO TABS: 10.0000 mg | ORAL_TABLET | Freq: Every day | ORAL | 11 refills | Status: DC

## 2019-11-28 MED ORDER — MONTELUKAST SODIUM 10 MG PO TABS: 10.0000 mg | ORAL_TABLET | Freq: Every day | ORAL | 11 refills | Status: DC

## 2019-11-28 NOTE — Patient Instructions (Addendum)
The patient should have follow up scheduled in 3 months with APP.   Cetirizine -  at night time.  Montelukast - in the morning   Flonase - 1 spray on each side of your nose twice a day for first week, then 1 spray on each side.   Instructions for use:  If you also use a saline nasal spray or rinse, use that first.  Position the head with the chin slightly tucked. Use the right hand to spray into the left nostril and the right hand to spray into the left nostril.   Point the bottle away from the septum of your nose (cartilage that divides the two sides of your nose).   Hold the nostril closed on the opposite side from where you will spray  Spray once and gently sniff to pull the medicine into the higher parts of your nose.  Don't sniff too hard as the medicine will drain down the back of your throat instead.  Repeat with a second spray on the same side if prescribed.  Repeat on the other side of your nose.

## 2019-11-28 NOTE — Assessment & Plan Note (Signed)
Following w/ Cards.  On Metoprolol and Eliquis.  Currently asymptomatic.

## 2019-11-28 NOTE — Assessment & Plan Note (Signed)
Pt w/ hx of this.  Check labs and replete prn.

## 2019-11-28 NOTE — Assessment & Plan Note (Signed)
Chronic problem.  Following w/ Cardiology.  On beta blocker, ARB, statin, ASA.  Currently asymptomatic.

## 2019-11-28 NOTE — Progress Notes (Signed)
   Subjective:    Patient ID: Colleen Wright, female    DOB: 02-20-1941, 79 y.o.   MRN: [id number]  HPI Here today for AWV.  Risk Factors: Hyperlipidemia- chronic problem, on Lipitor 40mg  daily.  Denies abd pain, N/V. Hypothyroid- chrnic problem, on Levothyroxine 128mcg daily.  Denies changes to skin/hair/nails but complains of fatigue HTN- chronic problem, on Losartan HCTZ 50/12.5mg  daily and Metoprolol 50mg  daily w/ good control.  Denies CP, SOB, HAs, visual changes, edema Afib- chronic problem, on Metoprolol for rate control.  On Eliquis BID.  Denies palpitations Overweight- pt's BMI is 29.7 B12 deficiency- due for 6th shot today Physical Activity: no regular exercise Fall Risk: moderate risk- has fallen but no injuries Depression: denies Hearing: normal to conversational tones, mildly decreased to whispered voice ADL's: independent Cognitive: normal linear thought process, memory and attention intact Home Safety: safe at home, lives w/ husband Height, Weight, BMI, Visual Acuity: see vitals, vision corrected to 20/20 w/ glasses Counseling: no longer having colonoscopy, doesn't want to continue mammograms.  UTD on immunizations Labs Ordered: See A&P Care Plan: See A&P   Reviewed past medical, surgical, family and social histories.   Health Maintenance  Topic Date Due  . INFLUENZA VACCINE  03/17/2020  . TETANUS/TDAP  05/10/2022  . DEXA SCAN  Completed  . PNA vac Low Risk Adult  Completed    Patient Care Team    Relationship Specialty Notifications Start End  Midge Minium, MD PCP - General Family Medicine  12/04/16   Sanda Klein, MD PCP - Cardiology Cardiology Admissions 08/16/17   Sanda Klein, MD Consulting Physician Cardiology  05/06/16   Pieter Partridge, DO Consulting Physician Neurology  05/06/16   Madelin Rear, [facility] Pharmacist Pharmacist  11/06/19    Comment: Phone:  [phone])     Review of Systems For ROS see HPI   This visit occurred during the  SARS-CoV-2 public health emergency.  Safety protocols were in place, including screening questions prior to the visit, additional usage of staff PPE, and extensive cleaning of exam room while observing appropriate contact time as indicated for disinfecting solutions.       Objective:   Physical Exam Vitals reviewed.  Constitutional:      General: She is not in acute distress.    Appearance: Normal appearance. She is well-developed.  HENT:     Head: Normocephalic and atraumatic.  Eyes:     Conjunctiva/sclera: Conjunctivae normal.     Pupils: Pupils are equal, round, and reactive to light.  Neck:     Thyroid: No thyromegaly.  Cardiovascular:     Rate and Rhythm: Normal rate and regular rhythm.     Heart sounds: Normal heart sounds. No murmur.  Pulmonary:     Effort: Pulmonary effort is normal. No respiratory distress.     Breath sounds: Normal breath sounds.  Abdominal:     General: There is no distension.     Palpations: Abdomen is soft.     Tenderness: There is no abdominal tenderness.  Musculoskeletal:     Cervical back: Normal range of motion and neck supple.  Lymphadenopathy:     Cervical: No cervical adenopathy.  Skin:    General: Skin is warm and dry.  Neurological:     Mental Status: She is alert and oriented to person, place, and time.  Psychiatric:        Behavior: Behavior normal.           Assessment & Plan:

## 2019-11-28 NOTE — Assessment & Plan Note (Signed)
Chronic problem.  Well controlled today.  Asymptomatic.  Check labs.  No anticipated med changes. 

## 2019-11-28 NOTE — Patient Instructions (Addendum)
Follow up in 6 months to recheck BP, cholesterol, and thyroid We'll notify you of your lab results and make any changes if needed Continue to work on healthy diet and regular exercise- you can do it! If you want to see someone for the back pain, let me know Call with any questions or concerns Stay Safe!  Stay Healthy!   Preventive Care 30 Years and Older, Female Preventive care refers to lifestyle choices and visits with your health care provider that can promote health and wellness. This includes:  A yearly physical exam. This is also called an annual well check.  Regular dental and eye exams.  Immunizations.  Screening for certain conditions.  Healthy lifestyle choices, such as diet and exercise. What can I expect for my preventive care visit? Physical exam Your health care provider will check:  Height and weight. These may be used to calculate body mass index (BMI), which is a measurement that tells if you are at a healthy weight.  Heart rate and blood pressure.  Your skin for abnormal spots. Counseling Your health care provider may ask you questions about:  Alcohol, tobacco, and drug use.  Emotional well-being.  Home and relationship well-being.  Sexual activity.  Eating habits.  History of falls.  Memory and ability to understand (cognition).  Work and work Statistician.  Pregnancy and menstrual history. What immunizations do I need?  Influenza (flu) vaccine  This is recommended every year. Tetanus, diphtheria, and pertussis (Tdap) vaccine  You may need a Td booster every 10 years. Varicella (chickenpox) vaccine  You may need this vaccine if you have not already been vaccinated. Zoster (shingles) vaccine  You may need this after age 35. Pneumococcal conjugate (PCV13) vaccine  One dose is recommended after age 55. Pneumococcal polysaccharide (PPSV23) vaccine  One dose is recommended after age 77. Measles, mumps, and rubella (MMR) vaccine  You  may need at least one dose of MMR if you were born in 1957 or later. You may also need a second dose. Meningococcal conjugate (MenACWY) vaccine  You may need this if you have certain conditions. Hepatitis A vaccine  You may need this if you have certain conditions or if you travel or work in places where you may be exposed to hepatitis A. Hepatitis B vaccine  You may need this if you have certain conditions or if you travel or work in places where you may be exposed to hepatitis B. Haemophilus influenzae type b (Hib) vaccine  You may need this if you have certain conditions. You may receive vaccines as individual doses or as more than one vaccine together in one shot (combination vaccines). Talk with your health care provider about the risks and benefits of combination vaccines. What tests do I need? Blood tests  Lipid and cholesterol levels. These may be checked every 5 years, or more frequently depending on your overall health.  Hepatitis C test.  Hepatitis B test. Screening  Lung cancer screening. You may have this screening every year starting at age 50 if you have a 30-pack-year history of smoking and currently smoke or have quit within the past 15 years.  Colorectal cancer screening. All adults should have this screening starting at age 50 and continuing until age 58. Your health care provider may recommend screening at age 108 if you are at increased risk. You will have tests every 1-10 years, depending on your results and the type of screening test.  Diabetes screening. This is done by checking your blood  sugar (glucose) after you have not eaten for a while (fasting). You may have this done every 1-3 years.  Mammogram. This may be done every 1-2 years. Talk with your health care provider about how often you should have regular mammograms.  BRCA-related cancer screening. This may be done if you have a family history of breast, ovarian, tubal, or peritoneal cancers. Other  tests  Sexually transmitted disease (STD) testing.  Bone density scan. This is done to screen for osteoporosis. You may have this done starting at age 23. Follow these instructions at home: Eating and drinking  Eat a diet that includes fresh fruits and vegetables, whole grains, lean protein, and low-fat dairy products. Limit your intake of foods with high amounts of sugar, saturated fats, and salt.  Take vitamin and mineral supplements as recommended by your health care provider.  Do not drink alcohol if your health care provider tells you not to drink.  If you drink alcohol: ? Limit how much you have to 0-1 drink a day. ? Be aware of how much alcohol is in your drink. In the [place] one drink equals one 12 oz bottle of beer (355 mL), one 5 oz glass of wine (148 mL), or one 1 oz glass of hard liquor (44 mL). Lifestyle  Take daily care of your teeth and gums.  Stay active. Exercise for at least 30 minutes on 5 or more days each week.  Do not use any products that contain nicotine or tobacco, such as cigarettes, e-cigarettes, and chewing tobacco. If you need help quitting, ask your health care provider.  If you are sexually active, practice safe sex. Use a condom or other form of protection in order to prevent STIs (sexually transmitted infections).  Talk with your health care provider about taking a low-dose aspirin or statin. What's next?  Go to your health care provider once a year for a well check visit.  Ask your health care provider how often you should have your eyes and teeth checked.  Stay up to date on all vaccines. This information is not intended to replace advice given to you by your health care provider. Make sure you discuss any questions you have with your health care provider. Document Revised: 07/28/2018 Document Reviewed: 07/28/2018 Elsevier Patient Education  2020 [employer].

## 2019-11-28 NOTE — Assessment & Plan Note (Signed)
Chronic problem.  Currently on Lipitor w/o difficulty.  Check labs.  Adjust meds prn  

## 2019-11-28 NOTE — Assessment & Plan Note (Signed)
Pt due for 6th B12 shot today.  Recheck labs and determine if additional tx is needed.

## 2019-11-28 NOTE — Assessment & Plan Note (Signed)
Ongoing issue for pt.  BMI is 29.7  She has gained a few lbs since last visit but pt reports she is not exercising nor following any particular diet.  Encouraged her to start walking regularly.  Will follow.

## 2019-11-28 NOTE — Progress Notes (Signed)
AKAI SWEETING    UL:[id number]    1941/04/23  Primary Care Physician:Tabori, Aundra Millet, MD  Referring Physician: Midge Minium, MD [address] [place],  [place] [address] Reason for Consultation: "chronic cough" Date of Consultation: 11/28/2019  Chief complaint:   Chief Complaint  Patient presents with  . Consult    Cough since August.  Gets better and then worse.  Productive cough thick dark.  sob at times.  Pain in chest that wraps around to the back, comes and goes.     HPI: Here for new patient evaluation for chronic cough.  Started in August 2020. Was seen in the ED and had a negative chest xray. Started mucinex which helped a little bit.  Cough is productive with mucus, brown, but no bright red blood.  Cough does not wake her up night time.  She denies shortness of breath but she is not very active which she attributes to back pain. No wheezing. Has chest pain which she attributes to CAD and has had multiple stents placed - last 10 years ago. Sees Dr. Sallyanne Kuster. Husband also has significant CAD.   She does have chronic runny nose and doesn't take anything for that.  She took allergy shots for three years when she was younger.  Occasional reflux  She had a sister who died from lung cancer.  She had thyroid cancer and is s/p thyroidectomy.    Social history: Smoking history: former smoker no passive smoke exposure.   Social History   Occupational History  . Not on file  Tobacco Use  . Smoking status: Former Smoker    Packs/day: 0.25    Years: 5.00    Pack years: 1.25    Types: Cigarettes    Quit date: 11/15/1968    Years since quitting: 51.0  . Smokeless tobacco: Never Used  Substance and Sexual Activity  . Alcohol use: No    Alcohol/week: 0.0 standard drinks  . Drug use: No  . Sexual activity: Not Currently    Relevant family history:  Family History  Problem Relation Age of Onset  . Cancer Mother        ovary/uterus-can't remember    . Stroke Mother   . Heart disease Father   . Heart disease Sister        bypass  . Stroke Sister   . Cancer Sister        LN, bone  . Hypertension Sister   . Heart disease Brother        bypass  . Cancer Cousin        breast    Past Medical History:  Diagnosis Date  . Adenomatous colon polyp 2006  . Allergy   . Arthritis    low back  . Back pain 06/13/2011  . CAD (coronary artery disease) 06/12/2011  . Depression   . GERD (gastroesophageal reflux disease)   . Hiatal hernia   . Hyperlipidemia   . Hypertension   . Hypothyroidism 06/12/2011  . Leg fracture   . Pain in joint, ankle and foot 11/02/2012  . Pain of left heel 05/23/2012  . Skin lesions, generalized 05/23/2012  . Thyroid cancer ([place])   . Ulcer   . Vertigo 06/13/2011    Past Surgical History:  Procedure Laterality Date  . BALLOON ANGIOPLASTY, ARTERY    . CARDIAC CATHETERIZATION N/A 01/24/2016   Procedure: Left Heart Cath and Coronary Angiography;  Surgeon: Ander Slade  [place], MD;  Location: [facility];  Service: Cardiovascular;  Laterality: N/A;  . EP IMPLANTABLE DEVICE N/A 09/24/2015   Procedure: Loop Recorder Insertion;  Surgeon: Sanda Klein, MD;  Location: [facility];  Service: Cardiovascular;  Laterality: N/A;  . FRACTURE SURGERY     left leg, pin and 5 bolts in place in lower tibia  . LESION REMOVAL     vaginal  . THYROIDECTOMY, PARTIAL     twice  . TUBAL LIGATION     growth removed from posterior vaginal vault     Review of systems: Review of Systems  Constitutional: Negative for chills, fever and weight loss.  HENT: Positive for congestion. Negative for sinus pain and sore throat.   Eyes: Negative for discharge and redness.  Respiratory: Positive for cough. Negative for hemoptysis, sputum production, shortness of breath and wheezing.   Cardiovascular: Negative for chest pain, palpitations and leg swelling.  Gastrointestinal: Negative for heartburn, nausea and vomiting.   Musculoskeletal: Negative for joint pain and myalgias.  Skin: Negative for rash.  Neurological: Negative for dizziness, tremors, focal weakness and headaches.  Endo/Heme/Allergies: Negative for environmental allergies.  Psychiatric/Behavioral: Negative for depression. The patient is not nervous/anxious.   All other systems reviewed and are negative.   Physical Exam: Blood pressure (!) 150/70, pulse 63, temperature (!) 97.1 F (36.2 C), temperature source Temporal, height 5\' 2"  (1.575 m), weight 162 lb (73.5 kg), SpO2 98 %. Gen:      No acute distress ENT:  no nasal polyps, mucus membranes moist. Mild nasal erythema and cobblestoning in oropharynx Lungs:    No increased respiratory effort, symmetric chest wall excursion, clear to auscultation bilaterally, no wheezes or crackles CV:        Irregularly irregular HR in 60s.  MSK: no acute synovitis of DIP or PIP joints, no mechanics hands.  Skin:      Warm and dry; no rashes Neuro: normal speech, no focal facial asymmetry Psych: alert and oriented x3, normal mood and affect   Data Reviewed/Medical Decision Making:  Independent interpretation of tests: Imaging: . Review of patient's chest ray 03/2019 images revealed no acute cardiopulmonary process. The patient's images have been independently reviewed by me.    PFTs: None on file.   Labs:  Lab Results  Component Value Date   WBC 7.9 05/01/2019   HGB 12.7 05/01/2019   HCT 37.0 05/01/2019   MCV 88.0 05/01/2019   PLT 202.0 05/01/2019   Lab Results  Component Value Date   NA 138 05/01/2019   K 4.3 05/01/2019   CL 100 05/01/2019   CO2 30 05/01/2019     Immunization status:  Immunization History  Administered Date(s) Administered  . Fluad Quad(high Dose 65+) 05/01/2019  . Influenza Whole 05/15/2011  . Influenza, High Dose Seasonal PF 04/14/2018  . Influenza,inj,Quad PF,6+ Mos 04/15/2014, 06/21/2015, 07/05/2016, 06/04/2017  . Pneumococcal Conjugate-13 06/21/2015  .  Pneumococcal Polysaccharide-23 09/25/2013    . I reviewed prior external note(s) from PCP,  . I reviewed the result(s) of the labs and imaging as noted above.    Assessment:  Chronic Cough Chronic Rhinitis  Plan/Recommendations: Chronic cough most likely secondary to chronic rhinitis. I explained to her my systematic approach to patients with chronic cough and that we often start treating one condition at a time and if that does not help, we move on to the next most likely.  She has had rhinitis for years and would like to get control of that  first.  Neurogenic cough in the differential, less likely asthma although can obtain spirometry/FeNO at next visit if no improvement to exclude cough variant asthma.  Will start daily fluticasone intranasal, montelukast, and cetirizine scheduled. I reviewed instructions on medications with her as well as side effects.    Return to Care: Return in about 3 months (around 02/27/2020). with one of our excellent APPs.   Lenice Llamas, MD [facility] [place]  CC: Midge Minium, MD

## 2019-11-30 ENCOUNTER — Other Ambulatory Visit: Payer: Self-pay | Admitting: General Practice

## 2019-11-30 DIAGNOSIS — R7989 Other specified abnormal findings of blood chemistry: Secondary | ICD-10-CM

## 2019-11-30 DIAGNOSIS — E890 Postprocedural hypothyroidism: Secondary | ICD-10-CM

## 2019-11-30 MED ORDER — LEVOTHYROXINE SODIUM 125 MCG PO TABS: 125.0000 ug | ORAL_TABLET | Freq: Every day | ORAL | 3 refills | Status: DC

## 2019-12-15 ENCOUNTER — Telehealth: Payer: Self-pay | Admitting: Family Medicine

## 2019-12-15 NOTE — Telephone Encounter (Signed)
Called pt back. She stated that she has been working in her garden and had aggravated her back. She advised that she had talked to Dr. Birdie Riddle in the past about her back hurting all the time. I could not find recent documentation. Pt was advised that PCP was out of the office until Monday and we did not have any openings here at our office. Pt refused to be seen at another office.   Pt asked if I could send in a couple muscle relaxer's to get her through the weekend or just send her to a back doctor for her discs rubbing on each other. I advised pt that I did not have record of any of these conditions and that she would need an appointment to discuss. Pt finally relented into letting me schedule her an appt on Monday at 3pm.

## 2019-12-15 NOTE — Telephone Encounter (Signed)
Pt called in asking to talk to the Nurse, she states that is is having a backache and wanted to talk to someone about it. Please advise

## 2019-12-18 ENCOUNTER — Telehealth (INDEPENDENT_AMBULATORY_CARE_PROVIDER_SITE_OTHER): Payer: Medicare Other | Admitting: Family Medicine

## 2019-12-18 ENCOUNTER — Other Ambulatory Visit: Payer: Self-pay

## 2019-12-18 ENCOUNTER — Encounter: Payer: Self-pay | Admitting: Family Medicine

## 2019-12-18 DIAGNOSIS — G8929 Other chronic pain: Secondary | ICD-10-CM | POA: Diagnosis not present

## 2019-12-18 DIAGNOSIS — M546 Pain in thoracic spine: Secondary | ICD-10-CM | POA: Diagnosis not present

## 2019-12-18 MED ORDER — TIZANIDINE HCL 4 MG PO TABS: 4.0000 mg | ORAL_TABLET | Freq: Three times a day (TID) | ORAL | 0 refills | Status: DC | PRN

## 2019-12-18 NOTE — Progress Notes (Signed)
I have discussed the procedure for the virtual visit with the patient who has given consent to proceed with assessment and treatment.   Pt unable to obtain vitals.   Pt has no video capabilities.   Davis Gourd, CMA

## 2019-12-18 NOTE — Progress Notes (Signed)
Virtual Visit via Video   I connected with patient on 12/18/19 at  3:00 PM EDT by a video enabled telemedicine application and verified that I am speaking with the correct person using two identifiers.  Location patient: Home Location provider: [facility]up[facility]ist, Office Persons participating in the virtual visit: Patient, Provider, [place] (Jess B)  I discussed the limitations of evaluation and management by telemedicine and the availability of in person appointments. The patient expressed understanding and agreed to proceed.  Interactive audio and video telecommunications were attempted between this provider and patient, however failed, due to patient having technical difficulties OR patient did not have access to video capability.  We continued and completed visit with audio only.   Subjective:   HPI:   Back pain- 'it's been hurting forever.  I can't get nothing done'.  Pt reports that Meloxicam was effective in the past.  Is curious if she could stop blood thinner to take NSAID.  Taking OTC Acetaminophen w/o relief.  Pt was trying to plant her garden and that worsened her pain.  Pain is R sided, just below shoulder blade at bra line.  No relief w/ heat.  'I don't want to be a person that just sits around'.  Doesn't want pain pills.  ROS:   See pertinent positives and negatives per HPI.  Patient Active Problem List   Diagnosis Date Noted  . Allergic rhinitis 05/01/2019  . Long term (current) use of anticoagulants 09/22/2018  . Carotid artery stenosis 12/03/2016  . Overweight (BMI 25.0-29.9) 05/06/2016  . Chest pain with high risk for cardiac etiology 01/24/2016  . Paroxysmal atrial fibrillation ([place]) 01/08/2016  . History of colonic polyps 12/12/2015  . Cryptogenic stroke ([place]) 09/13/2015  . Insomnia 08/10/2015  . Vitamin D deficiency 06/21/2015  . B12 deficiency 09/25/2013  . Cerebrovascular disease 06/13/2011  . Essential hypertension 06/13/2011  . Hyperlipidemia   .  Postsurgical hypothyroidism   . Coronary artery disease of native artery of native heart with stable angina pectoris [facility])     Social History   Tobacco Use  . Smoking status: Former Smoker    Packs/day: 0.25    Years: 5.00    Pack years: 1.25    Types: Cigarettes    Quit date: 11/15/1968    Years since quitting: 51.1  . Smokeless tobacco: Never Used  Substance Use Topics  . Alcohol use: No    Alcohol/week: 0.0 standard drinks    Current Outpatient Medications:  .  ALPRAZolam (XANAX) 0.5 MG tablet, Take 1 tablet (0.5 mg total) by mouth at bedtime., Disp: 30 tablet, Rfl: 1 .  apixaban (ELIQUIS) 5 MG TABS tablet, Take 1 tablet (5 mg total) by mouth 2 (two) times daily., Disp: 180 tablet, Rfl: 1 .  aspirin EC 81 MG tablet, Take 81 mg by mouth daily as needed (chest pain). , Disp: , Rfl:  .  atorvastatin (LIPITOR) 40 MG tablet, TAKE 1 TABLET BY MOUTH EVERY DAY IN THE EVENING, Disp: 90 tablet, Rfl: 1 .  cetirizine (ZYRTEC) 10 MG tablet, Take 1 tablet (10 mg total) by mouth daily., Disp: 30 tablet, Rfl: 11 .  fluticasone (FLONASE) 50 MCG/ACT nasal spray, Place 1 spray into both nostrils in the morning and at bedtime., Disp: 16 g, Rfl: 5 .  gabapentin (NEURONTIN) 100 MG capsule, Take 1 capsule (100 mg total) by mouth 3 (three) times daily., Disp: 45 capsule, Rfl: 0 .  levothyroxine (SYNTHROID) 125 MCG tablet, Take 1 tablet (125 mcg total) by  mouth daily., Disp: 30 tablet, Rfl: 3 .  losartan-hydrochlorothiazide (HYZAAR) 50-12.5 MG tablet, Take 1 tablet by mouth daily., Disp: 90 tablet, Rfl: 1 .  metoprolol succinate (TOPROL-XL) 50 MG 24 hr tablet, TAKE 1 TABLET BY MOUTH IMMEDIATELY FOLLOWING A MEAL, Disp: 90 tablet, Rfl: 1 .  montelukast (SINGULAIR) 10 MG tablet, Take 1 tablet (10 mg total) by mouth at bedtime., Disp: 30 tablet, Rfl: 11 .  tiZANidine (ZANAFLEX) 4 MG tablet, Take 1 tablet (4 mg total) by mouth every 8 (eight) hours as needed for muscle spasms., Disp: 30 tablet, Rfl: 0 .   valACYclovir (VALTREX) 1000 MG tablet, Take 1 tablet (1,000 mg total) by mouth 3 (three) times daily., Disp: 21 tablet, Rfl: 0  Allergies  Allergen Reactions  . Amoxicillin Other (See Comments)    Patient develops a very bad yeast infection. Did it involve swelling of the face/tongue/throat, SOB, or low BP? No Did it involve sudden or severe rash/hives, skin peeling, or any reaction on the inside of your mouth or nose? No Did you need to seek medical attention at a hospital or doctor's office? No When did it last happen?many years If all above answers are "NO", may proceed with cephalosporin use.     Objective:   There were no vitals taken for this visit.  Pt is able to speak clearly, coherently without shortness of breath or increased work of breathing.  Thought process is linear.  Mood is appropriate.   Assessment and Plan:   Thoracic back pain- chronic but recently worsened.  Pt not able to take NSAIDs due to Eliquis.  Told her that stopping Eliquis is not an option at this time.  She is to continue Acetaminophen.  Will add low dose muscle relaxer.  She is to use a heating pad as needed.  Will refer to Ortho.  Pt prefers to go to [employer] b/c daughter-in-law works there.   [name], MD 12/18/2019  Time spent with the patient: 11 minutes, of which >50% was spent in obtaining information about symptoms, reviewing previous labs, evaluations, and treatments, counseling about condition (please see the discussed topics above), and developing a plan to further investigate it; had a number of questions which I addressed.

## 2019-12-20 DIAGNOSIS — M545 Low back pain: Secondary | ICD-10-CM | POA: Diagnosis not present

## 2019-12-20 DIAGNOSIS — M546 Pain in thoracic spine: Secondary | ICD-10-CM | POA: Diagnosis not present

## 2019-12-28 NOTE — Progress Notes (Signed)
This encounter was created in error - please disregard.

## 2019-12-30 ENCOUNTER — Other Ambulatory Visit: Payer: Self-pay | Admitting: Family Medicine

## 2020-01-01 ENCOUNTER — Other Ambulatory Visit: Payer: Self-pay

## 2020-01-01 ENCOUNTER — Ambulatory Visit (INDEPENDENT_AMBULATORY_CARE_PROVIDER_SITE_OTHER): Payer: Medicare Other

## 2020-01-01 DIAGNOSIS — E890 Postprocedural hypothyroidism: Secondary | ICD-10-CM

## 2020-01-01 DIAGNOSIS — R7989 Other specified abnormal findings of blood chemistry: Secondary | ICD-10-CM | POA: Diagnosis not present

## 2020-01-01 NOTE — Telephone Encounter (Signed)
Last OV 11/28/19 Alprazolam last filled 11/10/19 #30 with 1

## 2020-01-02 LAB — BASIC METABOLIC PANEL
BUN: 35 mg/dL — ABNORMAL HIGH (ref 6–23)
CO2: 29 mEq/L (ref 19–32)
Calcium: 9.3 mg/dL (ref 8.4–10.5)
Chloride: 96 mEq/L (ref 96–112)
Creatinine, Ser: 1.43 mg/dL — ABNORMAL HIGH (ref 0.40–1.20)
GFR: 35.23 mL/min — ABNORMAL LOW (ref >60.00)
Glucose, Bld: 91 mg/dL (ref 70–99)
Potassium: 3.9 mEq/L (ref 3.5–5.1)
Sodium: 134 mEq/L — ABNORMAL LOW (ref 135–145)

## 2020-01-02 LAB — TSH: TSH: 1.43 u[IU]/mL (ref 0.35–4.50)

## 2020-01-04 ENCOUNTER — Other Ambulatory Visit: Payer: Self-pay | Admitting: Emergency Medicine

## 2020-01-04 DIAGNOSIS — R7989 Other specified abnormal findings of blood chemistry: Secondary | ICD-10-CM

## 2020-01-05 ENCOUNTER — Other Ambulatory Visit: Payer: Self-pay | Admitting: Cardiovascular Disease

## 2020-01-08 ENCOUNTER — Other Ambulatory Visit: Payer: Self-pay | Admitting: Family Medicine

## 2020-01-08 ENCOUNTER — Encounter: Payer: Self-pay | Admitting: General Practice

## 2020-01-08 DIAGNOSIS — R7989 Other specified abnormal findings of blood chemistry: Secondary | ICD-10-CM

## 2020-01-16 ENCOUNTER — Ambulatory Visit (INDEPENDENT_AMBULATORY_CARE_PROVIDER_SITE_OTHER): Payer: Medicare Other | Admitting: Emergency Medicine

## 2020-01-16 ENCOUNTER — Other Ambulatory Visit: Payer: Self-pay

## 2020-01-16 DIAGNOSIS — R7989 Other specified abnormal findings of blood chemistry: Secondary | ICD-10-CM

## 2020-01-16 LAB — BASIC METABOLIC PANEL
BUN: 28 mg/dL — ABNORMAL HIGH (ref 6–23)
CO2: 31 mEq/L (ref 19–32)
Calcium: 9.6 mg/dL (ref 8.4–10.5)
Chloride: 98 mEq/L (ref 96–112)
Creatinine, Ser: 1.3 mg/dL — ABNORMAL HIGH (ref 0.40–1.20)
GFR: 39.32 mL/min — ABNORMAL LOW (ref >60.00)
Glucose, Bld: 88 mg/dL (ref 70–99)
Potassium: 3.9 mEq/L (ref 3.5–5.1)
Sodium: 137 mEq/L (ref 135–145)

## 2020-01-24 ENCOUNTER — Ambulatory Visit (INDEPENDENT_AMBULATORY_CARE_PROVIDER_SITE_OTHER): Payer: Medicare Other | Admitting: Cardiovascular Disease

## 2020-01-24 ENCOUNTER — Ambulatory Visit: Payer: Medicare Other | Admitting: Cardiovascular Disease

## 2020-01-24 ENCOUNTER — Encounter: Payer: Self-pay | Admitting: Cardiovascular Disease

## 2020-01-24 ENCOUNTER — Other Ambulatory Visit: Payer: Self-pay

## 2020-01-24 VITALS — BP 128/62 | HR 79 | Ht 62.0 in | Wt 151.4 lb

## 2020-01-24 DIAGNOSIS — I25118 Atherosclerotic heart disease of native coronary artery with other forms of angina pectoris: Secondary | ICD-10-CM | POA: Diagnosis not present

## 2020-01-24 DIAGNOSIS — I10 Essential (primary) hypertension: Secondary | ICD-10-CM

## 2020-01-24 DIAGNOSIS — I480 Paroxysmal atrial fibrillation: Secondary | ICD-10-CM | POA: Diagnosis not present

## 2020-01-24 DIAGNOSIS — Z7901 Long term (current) use of anticoagulants: Secondary | ICD-10-CM | POA: Diagnosis not present

## 2020-01-24 DIAGNOSIS — Z4509 Encounter for adjustment and management of other cardiac device: Secondary | ICD-10-CM | POA: Diagnosis not present

## 2020-01-24 NOTE — Progress Notes (Signed)
Patient ID: Colleen Wright, female   DOB: 29-Jan-1941, 79 y.o.   MRN: [id number]    Cardiology Office Note    Date:  01/28/2020   ID:  Colleen Wright, DOB [birth year], MRN [id number]  PCP:  Midge Minium, MD  Cardiologist:   Sanda Klein, MD   Chief Complaint  Patient presents with  . Atrial Fibrillation  . Coronary Artery Disease  . Carotid    History of Present Illness:  Colleen Wright is a 79 y.o. 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  From a cardiovascular point of view she is doing quite well.  She denies angina or dyspnea at rest or with activity and has not had any palpitations, syncope or focal neurological events.  She is compliant with anticoagulation with Eliquis and has not had bleeding problems.  She denies falls or serious injuries.  She has lost about 11 pounds since her last appointment  The patient specifically denies any chest pain at rest exertion, dyspnea at rest or with exertion, orthopnea, paroxysmal nocturnal dyspnea, syncope, palpitations, focal neurological deficits, intermittent claudication, lower extremity edema, unexplained weight gain, cough, hemoptysis or wheezing.  Her most recent lipid profile from just a couple of months ago showed an LDL cholesterol of 74.  Cardiac catheterization in June 2017 showed a 50% mid RCA, 70% distal RCA, farther downstream 80% distal RCA/PL branch stenosis as well as 60% mid LAD stenosis. She has normal left ventricle systolic function.  The right coronary artery was very tortuous and medical therapy was recommended.  Most recent carotid duplex in December 2020 showed a  50-69% stenosis in the left internal carotid artery, although the velocity ratio suggested that the stenosis was less severe.  Her loop recorder has reached end of service.  She is thinking about having it explanted, but not anytime soon.   Past Medical History:  Diagnosis Date  . Adenomatous colon polyp 2006  . Allergy   . Arthritis    low back  . Back pain 06/13/2011  . CAD (coronary artery disease) 06/12/2011  . Depression   . GERD (gastroesophageal reflux disease)   . Hiatal hernia   . Hyperlipidemia   . Hypertension   . Hypothyroidism 06/12/2011  . Leg fracture   . Pain in joint, ankle and foot 11/02/2012  . Pain of left heel 05/23/2012  . Skin lesions, generalized 05/23/2012  . Thyroid cancer ([place])   . Ulcer   . Vertigo 06/13/2011    Past Surgical History:  Procedure Laterality Date  . BALLOON ANGIOPLASTY, ARTERY    . CARDIAC CATHETERIZATION N/A 01/24/2016   Procedure: Left Heart Cath and Coronary Angiography;  Surgeon: Peter M Martinique, MD;  Location: [facility];  Service: Cardiovascular;  Laterality: N/A;  . EP IMPLANTABLE DEVICE N/A 09/24/2015   Procedure: Loop Recorder Insertion;  Surgeon: Sanda Klein, MD;  Location: [facility];  Service: Cardiovascular;  Laterality: N/A;  . FRACTURE SURGERY     left leg, pin and 5 bolts in place in lower tibia  . LESION REMOVAL     vaginal  . THYROIDECTOMY, PARTIAL     twice  . TUBAL LIGATION     growth removed from posterior vaginal  vault    Current Medications: Outpatient Medications Prior to Visit  Medication Sig Dispense Refill  . ALPRAZolam (XANAX) 0.5 MG tablet TAKE 1 TABLET BY MOUTH AT BEDTIME AS NEEDED FOR ANXIETY 30 tablet 1  . apixaban (ELIQUIS) 5 MG TABS tablet Take 1 tablet (5 mg total) by mouth 2 (two) times daily. 180 tablet 1  . aspirin EC 81 MG tablet Take 81 mg by mouth daily as needed (chest pain).     Marland Kitchen atorvastatin (LIPITOR) 40 MG tablet TAKE 1 TABLET BY MOUTH EVERY DAY IN THE EVENING 90 tablet  1  . cetirizine (ZYRTEC) 10 MG tablet Take 1 tablet (10 mg total) by mouth daily. 30 tablet 11  . fluticasone (FLONASE) 50 MCG/ACT nasal spray Place 1 spray into both nostrils in the morning and at bedtime. 16 g 5  . levothyroxine (SYNTHROID) 125 MCG tablet Take 1 tablet (125 mcg total) by mouth daily. 30 tablet 3  . losartan-hydrochlorothiazide (HYZAAR) 50-12.5 MG tablet Take 1 tablet by mouth daily. 90 tablet 1  . metoprolol succinate (TOPROL-XL) 50 MG 24 hr tablet Take 1 tablet (50 mg total) by mouth daily. 90 tablet 0  . montelukast (SINGULAIR) 10 MG tablet Take 1 tablet (10 mg total) by mouth at bedtime. 30 tablet 11  . tiZANidine (ZANAFLEX) 4 MG tablet Take 1 tablet (4 mg total) by mouth every 8 (eight) hours as needed for muscle spasms. 30 tablet 0  . valACYclovir (VALTREX) 1000 MG tablet Take 1 tablet (1,000 mg total) by mouth 3 (three) times daily. 21 tablet 0  . gabapentin (NEURONTIN) 100 MG capsule Take 1 capsule (100 mg total) by mouth 3 (three) times daily. 45 capsule 0   No facility-administered medications prior to visit.     Allergies:   Amoxicillin   Social History   Socioeconomic History  . Marital status: Married    Spouse name: Not on file  . Number of children: Not on file  . Years of education: Not on file  . Highest education level: Not on file  Occupational History  . Not on file  Tobacco Use  . Smoking status: Former Smoker    Packs/day: 0.25    Years: 5.00    Pack years: 1.25    Types: Cigarettes    Quit date: 11/15/1968    Years since quitting: 51.2  . Smokeless tobacco: Never Used  Vaping Use  . Vaping Use: Never used  Substance and Sexual Activity  . Alcohol use: No    Alcohol/week: 0.0 standard drinks  . Drug use: No  . Sexual activity: Not Currently  Other Topics Concern  . Not on file  Social History Narrative  . Not on file   Social Determinants of Health   Financial Resource Strain:   . Difficulty of Paying Living Expenses:   Food  Insecurity:   . Worried About Charity fundraiser in the Last Year:   . Arboriculturist in the Last Year:   Transportation Needs:   . Film/video editor (Medical):   Marland Kitchen Lack of Transportation (Non-Medical):   Physical Activity:   . Days of Exercise per Week:   . Minutes of Exercise per Session:   Stress:   . Feeling of Stress :   Social Connections:   . Frequency of Communication with Friends and Family:   . Frequency of Social Gatherings with Friends and Family:   . Attends Religious Services:   . Active Member of Clubs or Organizations:   .  Attends Archivist Meetings:   Marland Kitchen Marital Status:      Family History:  The patient's family history includes Cancer in her cousin, mother, and sister; Heart disease in her brother, father, and sister; Hypertension in her sister; Stroke in her mother and sister.   ROS:   Please see the history of present illness.    All other systems are reviewed and are negative.   PHYSICAL EXAM:   VS:  BP 128/62   Pulse 79   Ht 5\' 2"  (1.575 m)   Wt 151 lb 6.4 oz (68.7 kg)   SpO2 98%   BMI 27.69 kg/m      General: Alert, oriented x3, no distress, overweight Head: no evidence of trauma, PERRL, EOMI, no exophtalmos or lid lag, no myxedema, no xanthelasma; normal ears, nose and oropharynx Neck: normal jugular venous pulsations and no hepatojugular reflux; brisk carotid pulses without delay and no carotid bruits Chest: clear to auscultation, no signs of consolidation by percussion or palpation, normal fremitus, symmetrical and full respiratory excursions.  Healthy loop recorder site. Cardiovascular: normal position and quality of the apical impulse, regular rhythm, normal first and second heart sounds, no murmurs, rubs or gallops Abdomen: no tenderness or distention, no masses by palpation, no abnormal pulsatility or arterial bruits, normal bowel sounds, no hepatosplenomegaly Extremities: no clubbing, cyanosis or edema; 2+ radial, ulnar and  brachial pulses bilaterally; 2+ right femoral, posterior tibial and dorsalis pedis pulses; 2+ left femoral, posterior tibial and dorsalis pedis pulses; no subclavian or femoral bruits Neurological: grossly nonfocal Psych: Normal mood and affect    Wt Readings from Last 3 Encounters:  01/24/20 151 lb 6.4 oz (68.7 kg)  11/28/19 162 lb (73.5 kg)  11/28/19 162 lb 6 oz (73.7 kg)      Studies/Labs Reviewed:   EKG:  EKG is ordered today. Shows normal sinus rhythm and is a normal tracing.  Recent Labs: 04/03/2019: B Natriuretic Peptide 140.8; Magnesium 2.1 11/28/2019: ALT 30; Hemoglobin 12.6; Platelets 167.0 01/01/2020: TSH 1.43 01/16/2020: BUN 28; Creatinine, Ser 1.30; Potassium 3.9; Sodium 137   Lipid Panel    Component Value Date/Time   CHOL 136 11/28/2019 1122   TRIG 108.0 11/28/2019 1122   HDL 40.60 11/28/2019 1122   CHOLHDL 3 11/28/2019 1122   VLDL 21.6 11/28/2019 1122   LDLCALC 74 11/28/2019 1122      ASSESSMENT:    1. Coronary artery disease of native artery of native heart with stable angina pectoris (HCC)   2. Paroxysmal atrial fibrillation ([place])   3. Long term (current) use of anticoagulants   4. Essential hypertension   5. Encounter for loop recorder at end of battery life      PLAN:  In order of problems listed above:  1. CAD: Asymptomatic on beta-blocker antianginal therapy.  On statin and aspirin.  Single-vessel disease, felt to be challenging for PCI. 2. PAF: The overall burden of arrhythmia was quite low while she had loop recorder monitoring.  Asymptomatic.  She should continue anticoagulation especially since she has a history of stroke. CHADSVasc 7 (age 15, gender, HTN, CAD, CVA 2).  Antiarrhythmics are not indicated.  3. Eliquis: No bleeding complications. 4. HTN: Very well controlled. 5. Loop recorder: At end of service.  Can explant if she desires.    Medication Adjustments/Labs and Tests Ordered: Current medicines are reviewed at length with the  patient today.  Concerns regarding medicines are outlined above.  Medication changes, Labs and Tests ordered today are listed  in the Patient Instructions below. Patient Instructions  Medication Instructions:  Continue current medications  *If you need a refill on your cardiac medications before your next appointment, please call your pharmacy*   Lab Work: None Ordered  Testing/Procedures: None Ordered   Follow-Up: At [facility], you and your health needs are our priority.  As part of our continuing mission to provide you with exceptional heart care, we have created designated Provider Care Teams.  These Care Teams include your primary Cardiologist (physician) and Advanced Practice Providers (APPs -  Physician Assistants and Nurse Practitioners) who all work together to provide you with the care you need, when you need it.  We recommend signing up for the patient portal called "MyChart".  Sign up information is provided on this After Visit Summary.  MyChart is used to connect with patients for Virtual Visits (Telemedicine).  Patients are able to view lab/test results, encounter notes, upcoming appointments, etc.  Non-urgent messages can be sent to your provider as well.   To learn more about what you can do with MyChart, go to [email or web address].    Your next appointment:   1 year(s)  The format for your next appointment:   In Person  Provider:   You may see Sanda Klein, MD or one of the following Advanced Practice Providers on your designated Care Team:    Almyra Deforest, PA-C  [name], [place] or   Roby Lofts, PA-C         Signed, Sanda Klein, MD  01/28/2020 11:44 AM    [facility] [place], [place],   [address] Phone: [phone]; Fax: [phone]

## 2020-01-24 NOTE — Patient Instructions (Signed)
Medication Instructions:  Continue current medications  *If you need a refill on your cardiac medications before your next appointment, please call your pharmacy*   Lab Work: None Ordered   Testing/Procedures: None Ordered   Follow-Up: At [facility], you and your health needs are our priority.  As part of our continuing mission to provide you with exceptional heart care, we have created designated Provider Care Teams.  These Care Teams include your primary Cardiologist (physician) and Advanced Practice Providers (APPs -  Physician Assistants and Nurse Practitioners) who all work together to provide you with the care you need, when you need it.  We recommend signing up for the patient portal called "MyChart".  Sign up information is provided on this After Visit Summary.  MyChart is used to connect with patients for Virtual Visits (Telemedicine).  Patients are able to view lab/test results, encounter notes, upcoming appointments, etc.  Non-urgent messages can be sent to your provider as well.   To learn more about what you can do with MyChart, go to [email or web address].    Your next appointment:   1 year(s)  The format for your next appointment:   In Person  Provider:   You may see Mihai Croitoru, MD or one of the following Advanced Practice Providers on your designated Care Team:    Hao Meng, PA-C  Angela Duke, PA-C or   Krista Kroeger, PA-C    

## 2020-01-28 ENCOUNTER — Encounter: Payer: Self-pay | Admitting: Cardiovascular Disease

## 2020-01-30 DIAGNOSIS — H5712 Ocular pain, left eye: Secondary | ICD-10-CM | POA: Diagnosis not present

## 2020-01-30 DIAGNOSIS — H02055 Trichiasis without entropian left lower eyelid: Secondary | ICD-10-CM | POA: Diagnosis not present

## 2020-02-02 ENCOUNTER — Other Ambulatory Visit: Payer: Self-pay | Admitting: Family Medicine

## 2020-02-02 DIAGNOSIS — I10 Essential (primary) hypertension: Secondary | ICD-10-CM

## 2020-02-09 ENCOUNTER — Telehealth: Payer: Medicare Other

## 2020-02-12 ENCOUNTER — Telehealth: Payer: Self-pay | Admitting: Family Medicine

## 2020-02-12 NOTE — Telephone Encounter (Signed)
She can hold her Losartan but she needs an appt to check BP and come up w/ a reasonable alternative

## 2020-02-12 NOTE — Telephone Encounter (Signed)
Patient states that she did not take her Losartan for 3 days and is feeling better and does not want to go back on it - is there something else she can take?  Please advise

## 2020-02-12 NOTE — Telephone Encounter (Signed)
Please advise 

## 2020-02-12 NOTE — Telephone Encounter (Signed)
Called pt and let her know that PCP is willing to let her stay off BP medication as long as her BP remains normal. Pt is to restart med if it becomes elevated. Pt scheduled for a Follow up appt 01/30/20.

## 2020-02-13 ENCOUNTER — Telehealth: Payer: Medicare Other

## 2020-02-13 NOTE — Progress Notes (Unsigned)
Chronic Care Management Pharmacy  Name: Colleen Wright  MRN: [id number] DOB: [birth year]   Chief Complaint/ HPI Colleen Wright,  79 y.o. , female presents for their Initial CCM visit with the clinical pharmacist via telephone due to COVID-19 Pandemic.  PCP : Midge Minium, MD  Their chronic conditions include: HTN, HLD  Office Visits: -1/22 (PCP): B12 deficiency; received injection -9/14 (PCP): Pt reported cough x 1 month, runny nose. Restarted on cetirizine 10 mg daily  Consult/ED Visit: -8/17 [facility] ED): c/o cough, fatigue. Suspected viral uti  Medications: Outpatient Encounter Medications as of 02/13/2020  Medication Sig  . ALPRAZolam (XANAX) 0.5 MG tablet TAKE 1 TABLET BY MOUTH AT BEDTIME AS NEEDED FOR ANXIETY  . apixaban (ELIQUIS) 5 MG TABS tablet Take 1 tablet (5 mg total) by mouth 2 (two) times daily.  Marland Kitchen aspirin EC 81 MG tablet Take 81 mg by mouth daily as needed (chest pain).   Marland Kitchen atorvastatin (LIPITOR) 40 MG tablet TAKE 1 TABLET BY MOUTH EVERY DAY IN THE EVENING  . cetirizine (ZYRTEC) 10 MG tablet Take 1 tablet (10 mg total) by mouth daily.  . fluticasone (FLONASE) 50 MCG/ACT nasal spray Place 1 spray into both nostrils in the morning and at bedtime.  Marland Kitchen levothyroxine (SYNTHROID) 125 MCG tablet Take 1 tablet (125 mcg total) by mouth daily.  Marland Kitchen losartan-hydrochlorothiazide (HYZAAR) 50-12.5 MG tablet TAKE 1 TABLET BY MOUTH EVERY DAY  . metoprolol succinate (TOPROL-XL) 50 MG 24 hr tablet Take 1 tablet (50 mg total) by mouth daily.  . montelukast (SINGULAIR) 10 MG tablet Take 1 tablet (10 mg total) by mouth at bedtime.  Marland Kitchen tiZANidine (ZANAFLEX) 4 MG tablet Take 1 tablet (4 mg total) by mouth every 8 (eight) hours as needed for muscle spasms.  . valACYclovir (VALTREX) 1000 MG tablet Take 1 tablet (1,000 mg total) by mouth 3 (three) times daily.   No facility-administered encounter medications on file as of 02/13/2020.   Current Diagnosis/Assessment:  Goals Addressed    None    Hypertension  Office blood pressures are  BP Readings from Last 3 Encounters:  01/24/20 128/62  11/28/19 (!) 150/70  11/28/19 114/65  GFR: 46.31 (04/2019), 48.85 (8/29)   Patient is currently controlled on the following medications: metoprolol 50 mg ER daily and losartan-hctz daily. Denies any recent falls, dizziness or low blood pressure when testing at home. Recently had battery die on home BP machine, encouraged to replace so she can test when needed. We discussed blood pressure control and how good control can help prevent strokes and heart events. Patient does not follow a diet, rather does best to portion control.   Patient checks BP at home only when feeling symptomatic.  Plan Continue current medications. Recommend replacing batteries in home BP cuff to enable testing.      Hyperlipidemia   Lipid Panel     Component Value Date/Time   CHOL 136 11/28/2019 1122   TRIG 108.0 11/28/2019 1122   HDL 40.60 11/28/2019 1122   CHOLHDL 3 11/28/2019 1122   VLDL 21.6 11/28/2019 1122   LDLCALC 74 11/28/2019 1122    The ASCVD Risk score (Goff DC Jr., et al., 2013) failed to calculate for the following reasons:   The 2013 ASCVD risk score is only valid for ages 74 to 43   The patient has a prior MI or stroke diagnosis   Patient is currently controlled on the following medications: atorvastatin 40mg  daily. Patient inquired on purpose of medication. Was counseled  at length regarding potential benefit of medication and appropriate use. No concern for non-adherence at this time.    Plan Continue current medications   Vaccines  Reviewed and discussed patient's vaccination history. Up to date on routine vaccines. Has not received COVID vaccine, waiting on J&J/Janssen single-dose vaccine to become available. Not open to receiving two-dose series.   Immunization History  Administered Date(s) Administered  . Fluad Quad(high Dose 65+) 05/01/2019  . Influenza Whole 05/15/2011  .  Influenza, High Dose Seasonal PF 04/14/2018  . Influenza,inj,Quad PF,6+ Mos 04/15/2014, 06/21/2015, 07/05/2016, 06/04/2017  . Pneumococcal Conjugate-13 06/21/2015  . Pneumococcal Polysaccharide-23 09/25/2013   Plan Recommended patient receive COVID vaccine once her preferred vaccine becomes available.  Hypothyroidism  Patient denies any disease or medication related symptoms or side effects. Most recent thyroid lab reviewed. TSH within normal range.   TSH  Date Value Ref Range Status  01/01/2020 1.43 0.35 - 4.50 uIU/mL Final    Patient is currently controlled on the following medications: levothyroxine 112 mcg.   Plan  Continue current medications  Medication Management  . Patient expresses concern with cost of Eliquis. Patient assistance should be considered.  . Patient uses weekly pill boxes to organize medications. Would like to received [facility], [facility], pill packaging services and change preferred pharmacy [facility].  Plan . Will pursue patient assistance application for Eliquis.  Marland Kitchen Utilize [facility] for medication synchronization, packaging and delivery. _____  Ms. Freshour was given information about Chronic Care Management services today including:  1. CCM service includes personalized support from designated clinical staff supervised by her physician, including individualized plan of care and coordination with other care providers 2. 24/7 contact phone numbers for assistance for urgent and routine care needs. 3. Standard insurance, coinsurance, copays and deductibles apply for chronic care management only during months in which we provide at least 20 minutes of these services. Most insurances cover these services at 100%, however patients may be responsible for any copay, coinsurance and/or deductible if applicable. This service may help you avoid the need for more expensive face-to-face services. 4. Only one practitioner may furnish and bill the service  in a calendar month. 5. The patient may stop CCM services at any time (effective at the end of the month) by phone call to the office staff.  Patient agreed to services and verbal consent obtained  Verbal consent obtained for [facility] enhanced pharmacy services (medication synchronization, adherence packaging, delivery coordination). A medication sync plan was created to allow patient to get all medications delivered once every 30 to 90 days per patient preference. Patient understands they have freedom to choose pharmacy and clinical pharmacist will coordinate care between all prescribers and [facility].  Madelin Rear, Pharm.D. Clinical Pharmacist [facility] at [facility] [phone]

## 2020-02-14 ENCOUNTER — Telehealth: Payer: Medicare Other

## 2020-02-27 ENCOUNTER — Ambulatory Visit: Payer: Medicare Other | Admitting: Adult Health

## 2020-02-29 ENCOUNTER — Ambulatory Visit: Payer: Medicare Other | Admitting: Family Medicine

## 2020-03-06 ENCOUNTER — Telehealth: Payer: Medicare Other

## 2020-03-07 ENCOUNTER — Other Ambulatory Visit: Payer: Self-pay

## 2020-03-07 ENCOUNTER — Ambulatory Visit (INDEPENDENT_AMBULATORY_CARE_PROVIDER_SITE_OTHER): Payer: Medicare Other | Admitting: Family Medicine

## 2020-03-07 ENCOUNTER — Encounter: Payer: Self-pay | Admitting: Family Medicine

## 2020-03-07 VITALS — BP 129/81 | HR 79 | Temp 97.2°F | Resp 16 | Ht 62.0 in | Wt 154.0 lb

## 2020-03-07 DIAGNOSIS — M542 Cervicalgia: Secondary | ICD-10-CM

## 2020-03-07 DIAGNOSIS — I25118 Atherosclerotic heart disease of native coronary artery with other forms of angina pectoris: Secondary | ICD-10-CM | POA: Diagnosis not present

## 2020-03-07 DIAGNOSIS — R350 Frequency of micturition: Secondary | ICD-10-CM

## 2020-03-07 DIAGNOSIS — R82998 Other abnormal findings in urine: Secondary | ICD-10-CM

## 2020-03-07 DIAGNOSIS — I10 Essential (primary) hypertension: Secondary | ICD-10-CM | POA: Diagnosis not present

## 2020-03-07 LAB — POCT URINALYSIS DIPSTICK
Bilirubin, UA: NEGATIVE
Blood, UA: NEGATIVE
Glucose, UA: NEGATIVE
Ketones, UA: NEGATIVE
Protein, UA: POSITIVE — AB
Spec Grav, UA: 1.015 (ref 1.010–1.025)
Urobilinogen, UA: 0.2 E.U./dL
pH, UA: 6 (ref 5.0–8.0)

## 2020-03-07 MED ORDER — CEPHALEXIN 500 MG PO CAPS: 500.0000 mg | ORAL_CAPSULE | Freq: Two times a day (BID) | ORAL | 0 refills | Status: AC

## 2020-03-07 NOTE — Patient Instructions (Signed)
Follow up as needed or as scheduled Your urine is suspicious for infection so we will start Cephalexin twice daily- take w/ food Drink plenty of water Call with any questions or concerns Have a great summer!

## 2020-03-07 NOTE — Addendum Note (Signed)
Addended by: Fritz Pickerel on: 03/07/2020 01:39 PM   Modules accepted: Orders

## 2020-03-07 NOTE — Progress Notes (Signed)
   Subjective:    Patient ID: Colleen Wright, female    DOB: 1941/08/02, 79 y.o.   MRN: [id number]  HPI HTN- pt stopped her Losartan HCTZ in June after holding her medication for 3 days and deciding she felt better.  She is still on Metoprolol.  BP is adequately controlled today.  'I feel so much better since i've been off that stuff.  i'm not as drunk'.  Pt reports balance is better.  No CP, SOB, HAs, visual changes, edema.  L sided neck pain- sxs started ~6 weeks ago.  Pain is intermittent.  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  No TTP.  Currently asymptomatic.  Urinary frequency- sxs started last night.  She had to get up 4x last night.  No dysuria, hematuria.  Reports hx of recurrent UTIs.   Review of Systems For ROS see HPI   This visit occurred during the SARS-CoV-2 public health emergency.  Safety protocols were in place, including screening questions prior to the visit, additional usage of staff PPE, and extensive cleaning of exam room while observing appropriate contact time as indicated for disinfecting solutions.       Objective:   Physical Exam Vitals reviewed.  Constitutional:      General: She is not in acute distress.    Appearance: Normal appearance. She is well-developed.  HENT:     Head: Normocephalic and atraumatic.  Eyes:     Conjunctiva/sclera: Conjunctivae normal.     Pupils: Pupils are equal, round, and reactive to light.  Neck:     Thyroid: No thyromegaly.  Cardiovascular:     Rate and Rhythm: Normal rate and regular rhythm.     Heart sounds: Normal heart sounds. No murmur heard.   Pulmonary:     Effort: Pulmonary effort is normal. No respiratory distress.     Breath sounds: Normal breath sounds.  Abdominal:     General: There is no distension.     Palpations: Abdomen is soft.     Tenderness: There is no abdominal tenderness.  Musculoskeletal:     Cervical back: Normal range of motion and neck supple. No tenderness.    Lymphadenopathy:     Cervical: No cervical adenopathy.  Skin:    General: Skin is warm and dry.  Neurological:     Mental Status: She is alert and oriented to person, place, and time.  Psychiatric:        Behavior: Behavior normal.           Assessment & Plan:  Neck pain- new to provider.  Not present on exam today.  Pt reports it only hurts at certain times.  She has been remodeling her place and working quite hard.  I suspect she either strained a muscle or aggravated some underlying arthritis.  She is currently asymptomatic.  No further work up at this time.  Urinary frequency- new.  Pt denies dysuria or suprapubic pain.  She reports hx of recurrent UTIs but last urine culture was 10/30/19 and this was negative.  Urine today w/ nitrites and leuks.  Start empiric tx while awaiting culture results.

## 2020-03-07 NOTE — Assessment & Plan Note (Signed)
Adequate control since stopping her Losartan HCTZ.  She feels much better since stopping medication.  Less dizzy.  No need to restart meds at this time.  Will follow.

## 2020-03-09 LAB — URINE CULTURE
MICRO NUMBER:: 10737532
SPECIMEN QUALITY:: ADEQUATE

## 2020-04-04 ENCOUNTER — Other Ambulatory Visit: Payer: Self-pay | Admitting: Family Medicine

## 2020-04-17 ENCOUNTER — Other Ambulatory Visit: Payer: Self-pay | Admitting: Cardiovascular Disease

## 2020-04-18 ENCOUNTER — Other Ambulatory Visit: Payer: Self-pay | Admitting: Cardiovascular Disease

## 2020-05-08 ENCOUNTER — Other Ambulatory Visit: Payer: Self-pay | Admitting: Family Medicine

## 2020-05-10 ENCOUNTER — Emergency Department (HOSPITAL_COMMUNITY): Payer: Medicare Other

## 2020-05-10 ENCOUNTER — Other Ambulatory Visit: Payer: Self-pay

## 2020-05-10 ENCOUNTER — Observation Stay (HOSPITAL_COMMUNITY)
Admission: EM | Admit: 2020-05-10 | Discharge: 2020-05-11 | Disposition: A | Discharge status: Home / Self Care | Payer: Medicare Other | Attending: Internal Medicine | Admitting: Internal Medicine

## 2020-05-10 ENCOUNTER — Encounter (HOSPITAL_COMMUNITY): Payer: Self-pay | Admitting: Emergency Medicine

## 2020-05-10 DIAGNOSIS — R519 Headache, unspecified: Secondary | ICD-10-CM | POA: Diagnosis not present

## 2020-05-10 DIAGNOSIS — N390 Urinary tract infection, site not specified: Secondary | ICD-10-CM | POA: Diagnosis not present

## 2020-05-10 DIAGNOSIS — Z87891 Personal history of nicotine dependence: Secondary | ICD-10-CM | POA: Insufficient documentation

## 2020-05-10 DIAGNOSIS — Z7901 Long term (current) use of anticoagulants: Secondary | ICD-10-CM | POA: Diagnosis not present

## 2020-05-10 DIAGNOSIS — I679 Cerebrovascular disease, unspecified: Secondary | ICD-10-CM | POA: Diagnosis present

## 2020-05-10 DIAGNOSIS — Z7982 Long term (current) use of aspirin: Secondary | ICD-10-CM | POA: Insufficient documentation

## 2020-05-10 DIAGNOSIS — E876 Hypokalemia: Secondary | ICD-10-CM | POA: Diagnosis not present

## 2020-05-10 DIAGNOSIS — Z20822 Contact with and (suspected) exposure to covid-19: Secondary | ICD-10-CM | POA: Insufficient documentation

## 2020-05-10 DIAGNOSIS — R29810 Facial weakness: Secondary | ICD-10-CM | POA: Diagnosis not present

## 2020-05-10 DIAGNOSIS — R202 Paresthesia of skin: Secondary | ICD-10-CM | POA: Diagnosis not present

## 2020-05-10 DIAGNOSIS — E039 Hypothyroidism, unspecified: Secondary | ICD-10-CM | POA: Insufficient documentation

## 2020-05-10 DIAGNOSIS — R531 Weakness: Secondary | ICD-10-CM

## 2020-05-10 DIAGNOSIS — R262 Difficulty in walking, not elsewhere classified: Secondary | ICD-10-CM | POA: Diagnosis not present

## 2020-05-10 DIAGNOSIS — I2510 Atherosclerotic heart disease of native coronary artery without angina pectoris: Secondary | ICD-10-CM | POA: Insufficient documentation

## 2020-05-10 DIAGNOSIS — G4489 Other headache syndrome: Secondary | ICD-10-CM | POA: Diagnosis not present

## 2020-05-10 DIAGNOSIS — Z79899 Other long term (current) drug therapy: Secondary | ICD-10-CM | POA: Diagnosis not present

## 2020-05-10 DIAGNOSIS — J3089 Other allergic rhinitis: Secondary | ICD-10-CM | POA: Insufficient documentation

## 2020-05-10 DIAGNOSIS — R42 Dizziness and giddiness: Principal | ICD-10-CM

## 2020-05-10 DIAGNOSIS — H538 Other visual disturbances: Secondary | ICD-10-CM | POA: Diagnosis not present

## 2020-05-10 DIAGNOSIS — I10 Essential (primary) hypertension: Secondary | ICD-10-CM | POA: Insufficient documentation

## 2020-05-10 DIAGNOSIS — R112 Nausea with vomiting, unspecified: Secondary | ICD-10-CM

## 2020-05-10 DIAGNOSIS — B49 Unspecified mycosis: Secondary | ICD-10-CM | POA: Diagnosis present

## 2020-05-10 DIAGNOSIS — R201 Hypoesthesia of skin: Secondary | ICD-10-CM | POA: Diagnosis not present

## 2020-05-10 DIAGNOSIS — R52 Pain, unspecified: Secondary | ICD-10-CM | POA: Diagnosis not present

## 2020-05-10 LAB — CBC
HCT: 37.6 % (ref 36.0–46.0)
Hemoglobin: 12.7 g/dL (ref 12.0–15.0)
MCH: 29.6 pg (ref 26.0–34.0)
MCHC: 33.8 g/dL (ref 30.0–36.0)
MCV: 87.6 fL (ref 80.0–100.0)
Platelets: 163 10*3/uL (ref 150–400)
RBC: 4.29 MIL/uL (ref 3.87–5.11)
RDW: 12.6 % (ref 11.5–15.5)
WBC: 8 10*3/uL (ref 4.0–10.5)
nRBC: 0 % (ref 0.0–0.2)

## 2020-05-10 LAB — COMPREHENSIVE METABOLIC PANEL
ALT: 27 U/L (ref 0–44)
AST: 37 U/L (ref 15–41)
Albumin: 4.1 g/dL (ref 3.5–5.0)
Alkaline Phosphatase: 83 U/L (ref 38–126)
Anion gap: 12 (ref 5–15)
BUN: 19 mg/dL (ref 8–23)
CO2: 24 mmol/L (ref 22–32)
Calcium: 9.3 mg/dL (ref 8.9–10.3)
Chloride: 100 mmol/L (ref 98–111)
Creatinine, Ser: 0.99 mg/dL (ref 0.44–1.00)
GFR calc Af Amer: >60 mL/min (ref >60)
GFR calc non Af Amer: 53 mL/min — ABNORMAL LOW (ref >60)
Glucose, Bld: 139 mg/dL — ABNORMAL HIGH (ref 70–99)
Potassium: 3.3 mmol/L — ABNORMAL LOW (ref 3.5–5.1)
Sodium: 136 mmol/L (ref 135–145)
Total Bilirubin: 0.5 mg/dL (ref 0.3–1.2)
Total Protein: 7.6 g/dL (ref 6.5–8.1)

## 2020-05-10 MED ORDER — MORPHINE SULFATE (PF) 4 MG/ML IV SOLN
4.0000 mg | Freq: Once | INTRAVENOUS | Status: AC
  Administered 2020-05-10: 4 mg via INTRAVENOUS
  Filled 2020-05-10: qty 1

## 2020-05-10 MED ORDER — POTASSIUM CHLORIDE CRYS ER 20 MEQ PO TBCR
40.0000 meq | EXTENDED_RELEASE_TABLET | Freq: Once | ORAL | Status: AC
  Administered 2020-05-11: 40 meq via ORAL
  Filled 2020-05-10: qty 2

## 2020-05-10 MED ORDER — ONDANSETRON HCL 4 MG/2ML IJ SOLN
4.0000 mg | Freq: Once | INTRAMUSCULAR | Status: AC
  Administered 2020-05-10: 4 mg via INTRAVENOUS
  Filled 2020-05-10: qty 2

## 2020-05-10 MED ORDER — LIDOCAINE-EPINEPHRINE (PF) 2 %-1:200000 IJ SOLN: 20.0000 mL | Freq: Once | INTRAMUSCULAR | Status: DC

## 2020-05-10 MED ORDER — MECLIZINE HCL 25 MG PO TABS
12.5000 mg | ORAL_TABLET | Freq: Once | ORAL | Status: AC
  Administered 2020-05-10: 12.5 mg via ORAL
  Filled 2020-05-10: qty 1

## 2020-05-10 NOTE — ED Triage Notes (Signed)
Pt c/o weakness, dizziness, vomiting and R sided HA x 42mo getting worse over the last 3-4 days, taken off metoprolol approx1 mo ago, denies CP/SOB

## 2020-05-10 NOTE — ED Provider Notes (Signed)
[facility] DEPT Provider Note   CSN: [id number] Arrival date & time: 05/10/20  1929     History Chief Complaint  Patient presents with  . Weakness  . Nausea    SAIDI SANTACROCE is a 79 y.o. female.  Patient c/o general weakness, dizziness, headache, for the past 1 month. States in past 1-2 days, severe falls due to feeling dizzy and unsteady on feet. States also in past day numbness to left upper extremity. Symptoms gradual onset, constant, persistent. Denies loc with falls. No neck or back pain. Denies chest pain or sob. No abd pain. States did have 1-2 episodes emesis in past day, not bloody or bilious. Is having normal bms. No dysuria or gu c/o. Denies change in meds or new meds. No fever or chills. Pt describes dizziness as unsteadiness on feet, as well as a room spinning/moving sensation. Denies ear pain, hearing loss, or tinnitus.   The history is provided by the patient.  Weakness Associated symptoms: dizziness, headaches and vomiting   Associated symptoms: no abdominal pain, no chest pain, no cough, no diarrhea, no dysuria, no fever and no shortness of breath        Past Medical History:  Diagnosis Date  . Adenomatous colon polyp 2006  . Allergy   . Arthritis    low back  . Back pain 06/13/2011  . CAD (coronary artery disease) 06/12/2011  . Depression   . GERD (gastroesophageal reflux disease)   . Hiatal hernia   . Hyperlipidemia   . Hypertension   . Hypothyroidism 06/12/2011  . Leg fracture   . Pain in joint, ankle and foot 11/02/2012  . Pain of left heel 05/23/2012  . Skin lesions, generalized 05/23/2012  . Thyroid cancer ([place])   . Ulcer   . Vertigo 06/13/2011    Patient Active Problem List   Diagnosis Date Noted  . Allergic rhinitis 05/01/2019  . Long term (current) use of anticoagulants 09/22/2018  . Carotid artery stenosis 12/03/2016  . Overweight (BMI 25.0-29.9) 05/06/2016  . Chest pain with high risk for cardiac etiology  01/24/2016  . Paroxysmal atrial fibrillation ([place]) 01/08/2016  . History of colonic polyps 12/12/2015  . Cryptogenic stroke ([place]) 09/13/2015  . Insomnia 08/10/2015  . Vitamin D deficiency 06/21/2015  . B12 deficiency 09/25/2013  . Cerebrovascular disease 06/13/2011  . Essential hypertension 06/13/2011  . Hyperlipidemia   . Postsurgical hypothyroidism   . Coronary artery disease of native artery of native heart with stable angina pectoris [facility])     Past Surgical History:  Procedure Laterality Date  . BALLOON ANGIOPLASTY, ARTERY    . CARDIAC CATHETERIZATION N/A 01/24/2016   Procedure: Left Heart Cath and Coronary Angiography;  Surgeon: Peter M Martinique, MD;  Location: [place] [facility];  Service: Cardiovascular;  Laterality: N/A;  . EP IMPLANTABLE DEVICE N/A 09/24/2015   Procedure: Loop Recorder Insertion;  Surgeon: Sanda Klein, MD;  Location: [place] [facility];  Service: Cardiovascular;  Laterality: N/A;  . FRACTURE SURGERY     left leg, pin and 5 bolts in place in lower tibia  . LESION REMOVAL     vaginal  . THYROIDECTOMY, PARTIAL     twice  . TUBAL LIGATION     growth removed from posterior vaginal vault     OB History   No obstetric history on file.     Family History  Problem Relation Age of Onset  . Cancer Mother        ovary/uterus-can't  remember  . Stroke Mother   . Heart disease Father   . Heart disease Sister        bypass  . Stroke Sister   . Cancer Sister        LN, bone  . Hypertension Sister   . Heart disease Brother        bypass  . Cancer Cousin        breast    Social History   Tobacco Use  . Smoking status: Former Smoker    Packs/day: 0.25    Years: 5.00    Pack years: 1.25    Types: Cigarettes    Quit date: 11/15/1968    Years since quitting: 51.5  . Smokeless tobacco: Never Used  Vaping Use  . Vaping Use: Never used  Substance Use Topics  . Alcohol use: No    Alcohol/week: 0.0 standard drinks  . Drug use: No    Home  Medications Prior to Admission medications   Medication Sig Start Date End Date Taking? Authorizing Provider  ALPRAZolam Duanne Moron) 0.5 MG tablet TAKE 1 TABLET BY MOUTH AT BEDTIME AS NEEDED FOR ANXIETY 01/01/20   Midge Minium, MD  aspirin EC 81 MG tablet Take 81 mg by mouth daily as needed (chest pain).     [provider]  atorvastatin (LIPITOR) 40 MG tablet TAKE 1 TABLET BY MOUTH EVERY DAY IN THE EVENING 05/08/20   Midge Minium, MD  cetirizine (ZYRTEC) 10 MG tablet Take 1 tablet (10 mg total) by mouth daily. 11/28/19   Spero Geralds, MD  ELIQUIS 5 MG TABS tablet TAKE 1 TABLET BY MOUTH TWICE A DAY 04/17/20   Croitoru, Mihai, MD  fluticasone (FLONASE) 50 MCG/ACT nasal spray Place 1 spray into both nostrils in the morning and at bedtime. 11/28/19   Spero Geralds, MD  levothyroxine (SYNTHROID) 125 MCG tablet TAKE 1 TABLET BY MOUTH EVERY DAY 04/04/20   Midge Minium, MD  metoprolol succinate (TOPROL-XL) 50 MG 24 hr tablet TAKE 1 TABLET BY MOUTH EVERY DAY 04/19/20   Croitoru, Mihai, MD  montelukast (SINGULAIR) 10 MG tablet Take 1 tablet (10 mg total) by mouth at bedtime. 11/28/19   Spero Geralds, MD  tiZANidine (ZANAFLEX) 4 MG tablet Take 1 tablet (4 mg total) by mouth every 8 (eight) hours as needed for muscle spasms. 12/18/19   Midge Minium, MD  valACYclovir (VALTREX) 1000 MG tablet Take 1 tablet (1,000 mg total) by mouth 3 (three) times daily. 10/10/19   Midge Minium, MD    Allergies    Amoxicillin  Review of Systems   Review of Systems  Constitutional: Negative for chills and fever.  HENT: Negative for sore throat and trouble swallowing.   Eyes: Negative for redness.  Respiratory: Negative for cough and shortness of breath.   Cardiovascular: Negative for chest pain and leg swelling.  Gastrointestinal: Positive for vomiting. Negative for abdominal pain and diarrhea.  Endocrine: Negative for polyuria.  Genitourinary: Negative for dysuria and flank pain.   Musculoskeletal: Negative for back pain and neck pain.  Skin: Negative for rash.  Neurological: Positive for dizziness, weakness and headaches. Negative for speech difficulty.  Hematological: Does not bruise/bleed easily.  Psychiatric/Behavioral: Negative for confusion.    Physical Exam Updated Vital Signs BP (!) 195/83   Temp 97.8 F (36.6 C) (Oral)   Resp 14   Ht 1.575 m (5\' 2" )   Wt 68.5 kg   SpO2 100%   BMI 27.62  kg/m   Physical Exam Vitals and nursing note reviewed.  Constitutional:      Appearance: Normal appearance. She is well-developed.  HENT:     Head: Atraumatic.     Nose: Nose normal.     Mouth/Throat:     Mouth: Mucous membranes are moist.  Eyes:     General: No scleral icterus.    Conjunctiva/sclera: Conjunctivae normal.     Pupils: Pupils are equal, round, and reactive to light.  Neck:     Vascular: No carotid bruit.     Trachea: No tracheal deviation.  Cardiovascular:     Rate and Rhythm: Normal rate and regular rhythm.     Pulses: Normal pulses.     Heart sounds: Normal heart sounds. No murmur heard.  No friction rub. No gallop.   Pulmonary:     Effort: Pulmonary effort is normal. No respiratory distress.     Breath sounds: Normal breath sounds.  Abdominal:     General: Bowel sounds are normal. There is no distension.     Palpations: Abdomen is soft.     Tenderness: There is no abdominal tenderness. There is no guarding.  Genitourinary:    Comments: No cva tenderness.  Musculoskeletal:        General: No swelling or tenderness.     Cervical back: Normal range of motion and neck supple. No rigidity. No muscular tenderness.     Right lower leg: No edema.     Left lower leg: No edema.     Comments: CTLS spine, non tender, aligned, no step off.   Skin:    General: Skin is warm and dry.     Findings: No rash.  Neurological:     Mental Status: She is alert.     Comments: Alert, speech normal. GCS 15. Motor intact bil, stre 5/5. No pronator  drift. Sens grossly intact bil.   Psychiatric:        Mood and Affect: Mood normal.     ED Results / Procedures / Treatments   Labs (all labs ordered are listed, but only abnormal results are displayed) Results for orders placed or performed during the hospital encounter of 05/10/20  CBC  Result Value Ref Range   WBC 8.0 4.0 - 10.5 K/uL   RBC 4.29 3.87 - 5.11 MIL/uL   Hemoglobin 12.7 12.0 - 15.0 g/dL   HCT 37.6 36 - 46 %   MCV 87.6 80.0 - 100.0 fL   MCH 29.6 26.0 - 34.0 pg   MCHC 33.8 30.0 - 36.0 g/dL   RDW 12.6 11.5 - 15.5 %   Platelets 163 150 - 400 K/uL   nRBC 0.0 0.0 - 0.2 %  Comprehensive metabolic panel  Result Value Ref Range   Sodium 136 135 - 145 mmol/L   Potassium 3.3 (L) 3.5 - 5.1 mmol/L   Chloride 100 98 - 111 mmol/L   CO2 24 22 - 32 mmol/L   Glucose, Bld 139 (H) 70 - 99 mg/dL   BUN 19 8 - 23 mg/dL   Creatinine, Ser 0.99 0.44 - 1.00 mg/dL   Calcium 9.3 8.9 - 10.3 mg/dL   Total Protein 7.6 6.5 - 8.1 g/dL   Albumin 4.1 3.5 - 5.0 g/dL   AST 37 15 - 41 U/L   ALT 27 0 - 44 U/L   Alkaline Phosphatase 83 38 - 126 U/L   Total Bilirubin 0.5 0.3 - 1.2 mg/dL   GFR calc non Af Amer 53 (L) >60  mL/min   GFR calc Af Amer >60 >60 mL/min   Anion gap 12 5 - 15     EKG EKG Interpretation  Date/Time:  Friday May 10 2020 19:42:16 EDT Ventricular Rate:  79 PR Interval:    QRS Duration: 89 QT Interval:  441 QTC Calculation: 506 R Axis:   43 Text Interpretation: Sinus rhythm No significant change since last tracing Confirmed by Lajean Saver [id number]) on 05/10/2020 9:25:29 PM   Radiology CT HEAD WO CONTRAST  Result Date: 05/10/2020 CLINICAL DATA:  Mental status change, unknown cause Dizziness, weakness and vomiting. Right-sided headache for 1 month, progressive over the last few days. EXAM: CT HEAD WITHOUT CONTRAST TECHNIQUE: Contiguous axial images were obtained from the base of the skull through the vertex without intravenous contrast. COMPARISON:  Head CT  08/09/2015, brain MRI 08/10/2015 FINDINGS: Brain: Stable generalized atrophy. Chronic small vessel ischemia with progression from 2016. Remote lacunar infarcts in the left basal ganglia. No intracranial hemorrhage, mass effect, or midline shift. No hydrocephalus. The basilar cisterns are patent. No evidence of territorial infarct or acute ischemia. No extra-axial or intracranial fluid collection. Vascular: Atherosclerosis of skullbase vasculature without hyperdense vessel or abnormal calcification. Skull: No fracture or focal lesion. Sinuses/Orbits: Chronic but progressive opacification of the right maxillary sinus with hyperdense material centrally and increased cortical thickening from prior exam. Remaining paranasal sinuses are clear. No acute orbital abnormality. Other: None. IMPRESSION: 1. No acute intracranial abnormality. 2. Generalized atrophy. Progressive chronic small vessel ischemia from 2016 comparison. 3. Chronic but progressive opacification of the right maxillary sinus with hyperdense material centrally and increased cortical thickening from prior exam. Electronically Signed   By: Keith Rake M.D.   On: 05/10/2020 20:44   MR BRAIN WO CONTRAST  Result Date: 05/10/2020 CLINICAL DATA:  Dizziness EXAM: MRI HEAD WITHOUT CONTRAST TECHNIQUE: Multiplanar, multiecho pulse sequences of the brain and surrounding structures were obtained without intravenous contrast. COMPARISON:  05/10/2020 head CT.  08/10/2015 MRI head and prior. FINDINGS: Brain: No diffusion-weighted signal abnormality. No intracranial hemorrhage. Moderate cerebral atrophy with ex vacuo dilatation. Advanced chronic microvascular ischemic changes. Sequela of remote left corona radiata lacunar insult. No midline shift, ventriculomegaly or extra-axial fluid collection. No mass lesion. Vascular: Grossly preserved proximal major intracranial flow voids. Skull and upper cervical spine: Normal marrow signal. Sinuses/Orbits: Normal orbits.  Moderate right maxillary sinus mucosal thickening with central T1 hyper/T2 hypointense signal. Trace bilateral mastoid effusion. Other: None. IMPRESSION: No acute intracranial process.  Remote left corona radiata insult. Moderate cerebral atrophy. Advanced chronic microvascular ischemic changes. 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 Electronically Signed   By: Primitivo Gauze M.D.   On: 05/10/2020 22:05    Procedures Procedures (including critical care time)  Medications Ordered in ED Medications - No data to display  ED Course  I have reviewed the triage vital signs and the nursing notes.  Pertinent labs & imaging results that were available during my care of the patient were reviewed by me and considered in my medical decision making (see chart for details).    MDM Rules/Calculators/A&P                          Iv ns. Continuous pulse ox and monitor. Stat labs. Imaging ordered.   Reviewed nursing notes and prior charts for additional history.   Labs reviewed/interpreted by me -  Wbc normal. Hgb normal.   CT reviewed/interpted by me -  No  hemorrhage.   Given dizziness, unsteady gait - will get MRI.   additional labs reviewed/interpreted by me - k mildly low. kcl po.   For dizziness, antivert po.   Po fluids/food. ambualte in hall.   MRI reviewed/interpreted by me - no acute cva.   UA remains pending.   2328 On recheck, is feeling improved from prior.  UA remains pending. Pt notes hx utis in past - nursing to send specimen. Pt signed out to Dr Sedonia Small, check UA when resulted, recheck pt - anticipate probable d/c to home then.       Final Clinical Impression(s) / ED Diagnoses Final diagnoses:  None    Rx / DC Orders ED Discharge Orders    None       Lajean Saver, MD 05/10/20 2329

## 2020-05-10 NOTE — ED Notes (Signed)
Patient transported to MRI 

## 2020-05-10 NOTE — Discharge Instructions (Addendum)
It was our pleasure to provide your ER care today - we hope that you feel better.  Rest. Drink plenty of fluids.   Take antivert as need if dizzy - no driving if/when dizzy, or when taking antivert.   You may take zofran as need for nausea.   Follow up with your doctor in the next 2-3 days. Also have your blood pressure rechecked then, as it is high today. From today's lab tests, your potassium level is mildly low (3.3) - eat plenty of fruits and vegetables, and follow up with primary care doctor.   Return to ER if worse, new symptoms, fevers, new or severe pain, persistent vomiting, chest pain, trouble breathing, or other concern.

## 2020-05-11 DIAGNOSIS — R42 Dizziness and giddiness: Secondary | ICD-10-CM | POA: Diagnosis not present

## 2020-05-11 DIAGNOSIS — R519 Headache, unspecified: Secondary | ICD-10-CM

## 2020-05-11 DIAGNOSIS — B49 Unspecified mycosis: Secondary | ICD-10-CM | POA: Diagnosis present

## 2020-05-11 DIAGNOSIS — J3089 Other allergic rhinitis: Secondary | ICD-10-CM | POA: Diagnosis present

## 2020-05-11 DIAGNOSIS — R262 Difficulty in walking, not elsewhere classified: Secondary | ICD-10-CM | POA: Diagnosis present

## 2020-05-11 LAB — COMPREHENSIVE METABOLIC PANEL
ALT: 26 U/L (ref 0–44)
AST: 35 U/L (ref 15–41)
Albumin: 3.8 g/dL (ref 3.5–5.0)
Alkaline Phosphatase: 81 U/L (ref 38–126)
Anion gap: 11 (ref 5–15)
BUN: 21 mg/dL (ref 8–23)
CO2: 26 mmol/L (ref 22–32)
Calcium: 9.4 mg/dL (ref 8.9–10.3)
Chloride: 98 mmol/L (ref 98–111)
Creatinine, Ser: 0.98 mg/dL (ref 0.44–1.00)
GFR calc Af Amer: >60 mL/min (ref >60)
GFR calc non Af Amer: 54 mL/min — ABNORMAL LOW (ref >60)
Glucose, Bld: 141 mg/dL — ABNORMAL HIGH (ref 70–99)
Potassium: 4.2 mmol/L (ref 3.5–5.1)
Sodium: 135 mmol/L (ref 135–145)
Total Bilirubin: 0.5 mg/dL (ref 0.3–1.2)
Total Protein: 7.6 g/dL (ref 6.5–8.1)

## 2020-05-11 LAB — CBC
HCT: 37.5 % (ref 36.0–46.0)
Hemoglobin: 12.5 g/dL (ref 12.0–15.0)
MCH: 29.8 pg (ref 26.0–34.0)
MCHC: 33.3 g/dL (ref 30.0–36.0)
MCV: 89.5 fL (ref 80.0–100.0)
Platelets: 177 10*3/uL (ref 150–400)
RBC: 4.19 MIL/uL (ref 3.87–5.11)
RDW: 12.6 % (ref 11.5–15.5)
WBC: 8.7 10*3/uL (ref 4.0–10.5)
nRBC: 0 % (ref 0.0–0.2)

## 2020-05-11 LAB — RESPIRATORY PANEL BY RT PCR (FLU A&B, COVID)
Influenza A by PCR: NEGATIVE
Influenza B by PCR: NEGATIVE
SARS Coronavirus 2 by RT PCR: NEGATIVE

## 2020-05-11 MED ORDER — KETOROLAC TROMETHAMINE 30 MG/ML IJ SOLN: 30.0000 mg | Freq: Three times a day (TID) | INTRAMUSCULAR | Status: DC | PRN

## 2020-05-11 MED ORDER — ONDANSETRON HCL 4 MG/2ML IJ SOLN: 4.0000 mg | Freq: Four times a day (QID) | INTRAMUSCULAR | Status: DC | PRN

## 2020-05-11 MED ORDER — DOCUSATE SODIUM 100 MG PO CAPS
100.0000 mg | ORAL_CAPSULE | Freq: Two times a day (BID) | ORAL | Status: DC
  Administered 2020-05-11: 100 mg via ORAL
  Filled 2020-05-11: qty 1

## 2020-05-11 MED ORDER — ACETAMINOPHEN 650 MG RE SUPP: 650.0000 mg | Freq: Four times a day (QID) | RECTAL | Status: DC | PRN

## 2020-05-11 MED ORDER — MONTELUKAST SODIUM 10 MG PO TABS
10.0000 mg | ORAL_TABLET | Freq: Every day | ORAL | Status: DC
  Administered 2020-05-11: 10 mg via ORAL
  Filled 2020-05-11: qty 1

## 2020-05-11 MED ORDER — ALPRAZOLAM 0.5 MG PO TABS: 0.5000 mg | ORAL_TABLET | Freq: Every evening | ORAL | Status: DC | PRN

## 2020-05-11 MED ORDER — ACETAMINOPHEN 325 MG PO TABS: 650.0000 mg | ORAL_TABLET | Freq: Four times a day (QID) | ORAL | Status: DC | PRN

## 2020-05-11 MED ORDER — HYDROCODONE-ACETAMINOPHEN 5-325 MG PO TABS: 1.0000 | ORAL_TABLET | ORAL | Status: DC | PRN

## 2020-05-11 MED ORDER — MECLIZINE HCL 25 MG PO TABS: 25.0000 mg | ORAL_TABLET | Freq: Three times a day (TID) | ORAL | 0 refills | Status: DC | PRN

## 2020-05-11 MED ORDER — METHOCARBAMOL 1000 MG/10ML IJ SOLN
500.0000 mg | Freq: Four times a day (QID) | INTRAVENOUS | Status: DC | PRN
  Filled 2020-05-11: qty 5

## 2020-05-11 MED ORDER — APIXABAN 5 MG PO TABS
5.0000 mg | ORAL_TABLET | Freq: Two times a day (BID) | ORAL | Status: DC
  Administered 2020-05-11 (×2): 5 mg via ORAL
  Filled 2020-05-11 (×2): qty 1

## 2020-05-11 MED ORDER — LORATADINE 10 MG PO TABS
10.0000 mg | ORAL_TABLET | Freq: Every day | ORAL | Status: DC
  Administered 2020-05-11: 10 mg via ORAL
  Filled 2020-05-11: qty 1

## 2020-05-11 MED ORDER — LEVOTHYROXINE SODIUM 125 MCG PO TABS
125.0000 ug | ORAL_TABLET | Freq: Every day | ORAL | Status: DC
  Administered 2020-05-11: 125 ug via ORAL
  Filled 2020-05-11: qty 1

## 2020-05-11 MED ORDER — ONDANSETRON HCL 4 MG PO TABS: 4.0000 mg | ORAL_TABLET | Freq: Four times a day (QID) | ORAL | Status: DC | PRN

## 2020-05-11 MED ORDER — DOXYCYCLINE HYCLATE 100 MG PO TABS
100.0000 mg | ORAL_TABLET | Freq: Two times a day (BID) | ORAL | Status: DC
  Administered 2020-05-11 (×2): 100 mg via ORAL
  Filled 2020-05-11 (×2): qty 1

## 2020-05-11 MED ORDER — ENOXAPARIN SODIUM 40 MG/0.4ML ~~LOC~~ SOLN: 40.0000 mg | SUBCUTANEOUS | Status: DC

## 2020-05-11 MED ORDER — LACTATED RINGERS IV SOLN: INTRAVENOUS | Status: DC

## 2020-05-11 MED ORDER — METOPROLOL SUCCINATE ER 50 MG PO TB24
50.0000 mg | ORAL_TABLET | Freq: Every day | ORAL | Status: DC
  Administered 2020-05-11: 50 mg via ORAL
  Filled 2020-05-11: qty 1

## 2020-05-11 MED ORDER — MORPHINE SULFATE (PF) 2 MG/ML IV SOLN: 2.0000 mg | INTRAVENOUS | Status: DC | PRN

## 2020-05-11 NOTE — Plan of Care (Signed)
  Problem: Education: Goal: Knowledge of General Education information will improve Description Including pain rating scale, medication(s)/side effects and non-pharmacologic comfort measures Outcome: Progressing   

## 2020-05-11 NOTE — H&P (Signed)
Colleen Wright is an 79 y.o. female.   Chief Complaint: Headache vertigo, dizziness, nausea and vomiting, generalized weakness HPI: The patient is a 79 yr old woman who presents with 1 month history of the above complaints.She has had falls in the past 1-2 days due to unsteadiness on her feet and dizziness. She is also having numbness in the left upper extremity. No loc with falls. She has had a couple episodes of emesis. She has both unsteadiness on her feet and the sensation of room spinning. No ear pain, tinnitus, or hearing loss.  In the ED the patient  Has had normal vital signs with the exception of high blood pressures. Oxygen saturations are 96% on room air. CT head and neck were obtained and did not demonstrate any acute pathology. MRI of the brain was also obtained. It demonstrated severe and chronic appearing right maxillary sinusitis. Fungal sinusitis is mentioned in the report. The patient has required 4 mg of morphine to control her headache pain. She is unable to walk and cannot care for herself at home.  The patient denies fevers, chills, cough, shortness of breath, or wheezing. No chest pain. No sputum production. 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 No lesions, no rashes, no sores.   [facility] were consulted to admit the patient for further evaluation and treatment.  Past Medical History:  Diagnosis Date  . Adenomatous colon polyp 2006  . Allergy   . Arthritis    low back  . Back pain 06/13/2011  . CAD (coronary artery disease) 06/12/2011  . Depression   . GERD (gastroesophageal reflux disease)   . Hiatal hernia   . Hyperlipidemia   . Hypertension   . Hypothyroidism 06/12/2011  . Leg fracture   . Pain in joint, ankle and foot 11/02/2012  . Pain of left heel 05/23/2012  . Skin lesions, generalized 05/23/2012  . Thyroid cancer ([place])   . Ulcer   . Vertigo 06/13/2011    Past Surgical History:  Procedure Laterality Date  . BALLOON ANGIOPLASTY, ARTERY     . CARDIAC CATHETERIZATION N/A 01/24/2016   Procedure: Left Heart Cath and Coronary Angiography;  Surgeon: Peter M Martinique, MD;  Location: [place];  Service: Cardiovascular;  Laterality: N/A;  . EP IMPLANTABLE DEVICE N/A 09/24/2015   Procedure: Loop Recorder Insertion;  Surgeon: Sanda Klein, MD;  Location: [facility];  Service: Cardiovascular;  Laterality: N/A;  . FRACTURE SURGERY     left leg, pin and 5 bolts in place in lower tibia  . LESION REMOVAL     vaginal  . THYROIDECTOMY, PARTIAL     twice  . TUBAL LIGATION     growth removed from posterior vaginal vault    Family History  Problem Relation Age of Onset  . Cancer Mother        ovary/uterus-can't remember  . Stroke Mother   . Heart disease Father   . Heart disease Sister        bypass  . Stroke Sister   . Cancer Sister        LN, bone  . Hypertension Sister   . Heart disease Brother        bypass  . Cancer Cousin        breast   Social History:  reports that she quit smoking about 51 years ago. Her smoking use included cigarettes. She has a 1.25 pack-year smoking history. She has never used smokeless tobacco. She reports that  she does not drink alcohol and does not use drugs. (Not in a hospital admission)   Allergies:  Allergies  Allergen Reactions  . Amoxicillin Other (See Comments)    Patient develops a very bad yeast infection. Did it involve swelling of the face/tongue/throat, SOB, or low BP? No Did it involve sudden or severe rash/hives, skin peeling, or any reaction on the inside of your mouth or nose? No Did you need to seek medical attention at a hospital or doctor's office? No When did it last happen?many years If all above answers are "NO", may proceed with cephalosporin use.     Pertinent items noted in HPI and remainder of comprehensive ROS otherwise negative.   General appearance: alert, cooperative and no distress Head: Normocephalic, without obvious abnormality,  atraumatic Eyes: conjunctivae/corneas clear. PERRL, EOM's intact. Fundi benign. Throat: lips, mucosa, and tongue normal; teeth and gums normal Neck: no adenopathy, no carotid bruit, no JVD, supple, symmetrical, trachea midline and thyroid not enlarged, symmetric, no tenderness/mass/nodules Resp: No increased work of breathing, no wheezes, rales, or rhonchi. No tactile fremitus. Chest wall: no tenderness Cardio: regular rate and rhythm, S1, S2 normal, no murmur, click, rub or gallop GI: soft, non-tender; bowel sounds normal; no masses,  no organomegaly Extremities: extremities normal, atraumatic, no cyanosis or edema Pulses: 2+ and symmetric Skin: Skin color, texture, turgor normal. No rashes or lesions Lymph nodes: Cervical, supraclavicular, and axillary nodes normal. Neurologic: Alert and oriented X 3, normal strength and tone. Normal symmetric reflexes. Normal coordination and gait  Results for orders placed or performed during the hospital encounter of 05/10/20 (from the past 48 hour(s))  CBC     Status: None   Collection Time: 05/10/20  8:09 PM  Result Value Ref Range   WBC 8.0 4.0 - 10.5 K/uL   RBC 4.29 3.87 - 5.11 MIL/uL   Hemoglobin 12.7 12.0 - 15.0 g/dL   HCT 37.6 36 - 46 %   MCV 87.6 80.0 - 100.0 fL   MCH 29.6 26.0 - 34.0 pg   MCHC 33.8 30.0 - 36.0 g/dL   RDW 12.6 11.5 - 15.5 %   Platelets 163 150 - 400 K/uL   nRBC 0.0 0.0 - 0.2 %    Comment: Performed at [facility], [place] [address] [place], [place] [address]  Comprehensive metabolic panel     Status: Abnormal   Collection Time: 05/10/20  8:09 PM  Result Value Ref Range   Sodium 136 135 - 145 mmol/L   Potassium 3.3 (L) 3.5 - 5.1 mmol/L   Chloride 100 98 - 111 mmol/L   CO2 24 22 - 32 mmol/L   Glucose, Bld 139 (H) 70 - 99 mg/dL    Comment: Glucose reference range applies only to samples taken after fasting for at least 8 hours.   BUN 19 8 - 23 mg/dL   Creatinine, Ser 0.99 0.44 - 1.00 mg/dL   Calcium  9.3 8.9 - 10.3 mg/dL   Total Protein 7.6 6.5 - 8.1 g/dL   Albumin 4.1 3.5 - 5.0 g/dL   AST 37 15 - 41 U/L   ALT 27 0 - 44 U/L   Alkaline Phosphatase 83 38 - 126 U/L   Total Bilirubin 0.5 0.3 - 1.2 mg/dL   GFR calc non Af Amer 53 (L) >60 mL/min   GFR calc Af Amer >60 >60 mL/min   Anion gap 12 5 - 15    Comment: Performed at [facility], [place]  [address], [place], [place] [address]   @[email or web address] @  Blood pressure (!) 168/77, pulse 77, temperature 97.8 F (36.6 C), temperature source Oral, resp. rate 17, height 5\' 2"  (1.575 m), weight 68.5 kg, SpO2 99 %.   Assessment/Plan Problem  Vertigo  Allergic Fungal Sinusitis  Unable to Walk  Cerebrovascular Disease   Vertigo: The patient has one month of worsening vertigo. It is associated with movement of her head and nausea and vomiting. Pt/OT to eval and treat.  Allergic Fungal Sinusitis: As per radiological report. ENT should be called in the am to see if it would be possible to aspirate her maxillary sinus to obtain a sample for culture.   Unable to walk: Unable to care for herself or walk due to severe vertigo. PT/OT to eval and treat.  Headache: Severe headache. Probably due to allergic fungal sinusitis. Pain control.   Cerebrovascular disease: The patient has had cryptogenic strokes in the past. CT head and MRI demonstrate no acute or subacute stroke.  I have seen and examined this patient myself. I have spent 65 minutes in her evaluation and care.  DVT Prophylaxis: Lovenox CODE STATUS: Full Code Family Communication: None available Disposition: The patient will be admitted to a med/surg bed on observation status. Status is: Observation  The patient remains OBS appropriate and will d/c before 2 midnights.  Dispo: The patient is from: Home              Anticipated d/c is to: Home              Anticipated d/c date is: 1 day              Patient currently is not medically stable to d/c. Demone Lyles 05/11/2020,  1:13 AM

## 2020-05-11 NOTE — Evaluation (Signed)
Physical Therapy Evaluation Patient Details Name: Colleen Wright MRN: [id number] DOB: 01/18/1941 Today's Date: 05/11/2020   History of Present Illness  Pt admitted with weakness, dizziness, vomitting, headache and inability to stand or ambulate.  Pt with hx of CAD, L tibia fx and Vertigo  Clinical Impression  Pt admitted as above but currently denying all symptoms despite attempts to provoke with rolling, position change and turning of head.  Pt up to ambulate in halls with initial mild instability but progressed to IND ambulation -  Pt states she is at baseline for mobility and hopeful for dc home this date.  PT service will sign off.  Please reorder if pt condition changes.    Follow Up Recommendations No PT follow up    Equipment Recommendations  None recommended by PT    Recommendations for Other Services       Precautions / Restrictions Precautions Precautions: Fall Restrictions Weight Bearing Restrictions: No      Mobility  Bed Mobility Overal bed mobility: Modified Independent             General bed mobility comments: Pt unassisted to sitting EOB  Transfers Overall transfer level: Modified independent Equipment used: None             General transfer comment: Pt unassisted to stand.  Ambulation/Gait Ambulation/Gait assistance: Min guard;Supervision;Independent Gait Distance (Feet): 400 Feet Assistive device: None Gait Pattern/deviations: Step-through pattern;Decreased step length - right;Decreased step length - left;Shuffle;Wide base of support;Staggering left;Staggering right Gait velocity: mod pace   General Gait Details: Pt demonstrating mild generalized instability initially - states "this is how I am when I first get up"  but with increased stability noted with increased distance ambulated and progressing to IND ambulation including side stepping and back stepping  Stairs            Wheelchair Mobility    Modified Rankin (Stroke Patients  Only)       Balance Overall balance assessment: Mild deficits observed, not formally tested                                           Pertinent Vitals/Pain Pain Assessment: No/denies pain    Home Living Family/patient expects to be discharged to:: Private residence Living Arrangements: Spouse/significant other Available Help at Discharge: Family Type of Home: House Home Access: Level entry     Home Layout: One level Home Equipment: None      Prior Function Level of Independence: Independent               Hand Dominance        Extremity/Trunk Assessment   Upper Extremity Assessment Upper Extremity Assessment: Defer to OT evaluation    Lower Extremity Assessment Lower Extremity Assessment: Overall WFL for tasks assessed       Communication   Communication: No difficulties  Cognition Arousal/Alertness: Awake/alert Behavior During Therapy: WFL for tasks assessed/performed Overall Cognitive Status: Within Functional Limits for tasks assessed                                        General Comments      Exercises     Assessment/Plan    PT Assessment Patent does not need any further PT services  PT Problem List  PT Treatment Interventions      PT Goals (Current goals can be found in the Care Plan section)  Acute Rehab PT Goals Patient Stated Goal: Something to eat and then home - in that order PT Goal Formulation: All assessment and education complete, DC therapy    Frequency     Barriers to discharge        Co-evaluation PT/OT/SLP Co-Evaluation/Treatment: Yes Reason for Co-Treatment: For patient/therapist safety PT goals addressed during session: Mobility/safety with mobility OT goals addressed during session: ADL's and self-care       AM-PAC PT "6 Clicks" Mobility  Outcome Measure Help needed turning from your back to your side while in a flat bed without using bedrails?: None Help needed  moving from lying on your back to sitting on the side of a flat bed without using bedrails?: None Help needed moving to and from a bed to a chair (including a wheelchair)?: A Little Help needed standing up from a chair using your arms (e.g., wheelchair or bedside chair)?: None Help needed to walk in hospital room?: A Little Help needed climbing 3-5 steps with a railing? : A Little 6 Click Score: 21    End of Session Equipment Utilized During Treatment: Gait belt Activity Tolerance: Patient tolerated treatment well Patient left: in chair;with call bell/phone within reach;with chair alarm set Nurse Communication: Mobility status PT Visit Diagnosis: Unsteadiness on feet (R26.81);Difficulty in walking, not elsewhere classified (R26.2)    Time: 2449-7530 PT Time Calculation (min) (ACUTE ONLY): 23 min   Charges:   PT Evaluation $PT Eval Low Complexity: 1 Low          [place] Pager [phone] Office [phone]   Constantin Hillery 05/11/2020, 11:55 AM

## 2020-05-11 NOTE — Plan of Care (Signed)
Patient discharged, all care plans met/discontinued. Deanna Artis RN

## 2020-05-11 NOTE — Evaluation (Signed)
Occupational Therapy Evaluation Patient Details Name: Colleen Wright MRN: [id number] DOB: February 13, 1941 Today's Date: 05/11/2020    History of Present Illness Pt admitted with weakness, dizziness, vomitting, headache and inability to stand or ambulate.  Pt with hx of CAD, L tibia fx and Vertigo   Clinical Impression   Mrs. Kataleah Bejar is an 79 year old woman who presents with chronic vertigo and a family history of vertigo. On evaluation patient demonstrates ability to perform bed mobility, ambulation and ADLs without assistance. Patient has no complaints of dizziness today. Patient able to roll over in bed, bend over to don socks and move head in each direction with ambulation without loss of balance or complaints of dizziness. Patient appears to be at baseline. No further OT needs at this time.    Follow Up Recommendations  No OT follow up    Equipment Recommendations  None recommended by OT    Recommendations for Other Services       Precautions / Restrictions Precautions Precautions: Fall Restrictions Weight Bearing Restrictions: No      Mobility Bed Mobility Overal bed mobility: Modified Independent             General bed mobility comments: Pt unassisted to sitting EOB  Transfers Overall transfer level: Modified independent Equipment used: None             General transfer comment: Pt unassisted to stand. Patient ambulated in hall wihtout assistance. Reports unsteadiness at baseline but no falls.    Balance Overall balance assessment: Mild deficits observed, not formally tested                                         ADL either performed or assessed with clinical judgement   ADL Overall ADL's : At baseline                                             Vision   Vision Assessment?: No apparent visual deficits     Perception     Praxis      Pertinent Vitals/Pain Pain Assessment: No/denies pain     Hand  Dominance     Extremity/Trunk Assessment Upper Extremity Assessment Upper Extremity Assessment: Overall WFL for tasks assessed   Lower Extremity Assessment Lower Extremity Assessment: Defer to PT evaluation   Cervical / Trunk Assessment Cervical / Trunk Assessment: Normal   Communication Communication Communication: No difficulties   Cognition Arousal/Alertness: Awake/alert Behavior During Therapy: WFL for tasks assessed/performed Overall Cognitive Status: Within Functional Limits for tasks assessed                                     General Comments       Exercises     Shoulder Instructions      Home Living Family/patient expects to be discharged to:: Private residence Living Arrangements: Spouse/significant other Available Help at Discharge: Family Type of Home: House Home Access: Level entry     Home Layout: One level     Bathroom Shower/Tub: Occupational psychologist: Standard     Home Equipment: None          Prior Functioning/Environment Level of Independence: Independent  OT Problem List:        OT Treatment/Interventions:      OT Goals(Current goals can be found in the care plan section) Acute Rehab OT Goals Patient Stated Goal: Something to eat and then home - in that order OT Goal Formulation: All assessment and education complete, DC therapy  OT Frequency:     Barriers to D/C:            Co-evaluation PT/OT/SLP Co-Evaluation/Treatment: Yes Reason for Co-Treatment: To address functional/ADL transfers;For patient/therapist safety PT goals addressed during session: Mobility/safety with mobility OT goals addressed during session: ADL's and self-care      AM-PAC OT "6 Clicks" Daily Activity     Outcome Measure Help from another person eating meals?: None Help from another person taking care of personal grooming?: None Help from another person toileting, which includes using toliet, bedpan,  or urinal?: None Help from another person bathing (including washing, rinsing, drying)?: None Help from another person to put on and taking off regular upper body clothing?: None Help from another person to put on and taking off regular lower body clothing?: None 6 Click Score: 24   End of Session Equipment Utilized During Treatment: Gait belt Nurse Communication: Mobility status  Activity Tolerance: Patient tolerated treatment well Patient left: in chair;with call bell/phone within reach  OT Visit Diagnosis: Unsteadiness on feet (R26.81);Dizziness and giddiness (R42)                Time: 1638-4536 OT Time Calculation (min): 21 min Charges:  OT General Charges $OT Visit: 1 Visit OT Evaluation $OT Eval Low Complexity: 1 Low  Reynolds Kittel, OTR/[place]  Office [phone] Pager: [place] 05/11/2020, 12:27 PM

## 2020-05-11 NOTE — ED Provider Notes (Signed)
  Provider Note MRN:  [id number]  Arrival date & time: 05/11/20    ED Course and Medical Decision Making  Assumed care from Dr. Cephus Shelling at shift change.  Persistent room spinning sensation, nausea, vomiting, MRI reassuring without evidence of central cause of vertigo.  Have tried multiple medications, patient was briefly feeling better but now is worse, unable to move her head without severe symptoms.  Admitted to hospital service for further care.  Procedures  Final Clinical Impressions(s) / ED Diagnoses     ICD-10-CM   1. Dizziness  R42   2. Nausea and vomiting in adult  R11.2   3. Hypokalemia  E87.6   4. Generalized headache  R51.9     ED Discharge Orders    None       Barth Kirks. [place]onia Small, [place] [email or web address]    Maudie Flakes, MD 05/11/20 [phone]

## 2020-05-11 NOTE — Discharge Summary (Signed)
Physician Discharge Summary  Colleen Wright XTK:[id number] DOB: 11-30-1940 DOA: 05/10/2020  PCP: Midge Minium, MD  Admit date: 05/10/2020 Discharge date: 05/11/2020  Admitted From: Home Disposition:  Home  Recommendations for Outpatient Follow-up:  1. Follow up with PCP in 1 week 2. Follow up with Dr. Benjamine Mola, ENT, in 1 week  Discharge Condition: Stable CODE STATUS: Full  Diet recommendation:  Diet Orders (From admission, onward)    Start     Ordered   05/11/20 0103  Diet Heart Room service appropriate? Yes; Fluid consistency: Thin  Diet effective now       Question Answer Comment  Room service appropriate? Yes   Fluid consistency: Thin      05/11/20 0104   05/11/20 0000  Diet - low sodium heart healthy        05/11/20 1208         Brief/Interim Summary: From H&P by Dr. Benny Lennert: "Colleen Wright is a 79 yr old woman who presents with 1 month history of the above complaints.She has had falls in the past 1-2 days due to unsteadiness on her feet and dizziness. She is also having numbness in the left upper extremity. No loc with falls. She has had a couple episodes of emesis. She has both unsteadiness on her feet and the sensation of room spinning. No ear pain, tinnitus, or hearing loss.  In the ED the patient  Has had normal vital signs with the exception of high blood pressures. Oxygen saturations are 96% on room air. CT head and neck were obtained and did not demonstrate any acute pathology. MRI of the brain was also obtained. It demonstrated severe and chronic appearing right maxillary sinusitis. Fungal sinusitis is mentioned in the report. The patient has required 4 mg of morphine to control her headache pain. She is unable to walk and cannot care for herself at home.  The patient denies fevers, chills, cough, shortness of breath, or wheezing. No chest pain. No sputum production. 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 No lesions, no rashes, no sores."  On morning of  discharge, patient symptoms had resolved.  She worked with PT and did not require any other outpatient recommendations.  She tolerated her diet without any issues.  Case was also discussed over the phone with ENT, Dr. Benjamine Mola, who reviewed her imaging.  He will follow up with patient as an outpatient.   Discharge Diagnoses:  Principal Problem:   Vertigo Active Problems:   Cerebrovascular disease   Weakness   Allergic fungal sinusitis   Headache   Discharge Instructions  Discharge Instructions    Call MD for:  difficulty breathing, headache or visual disturbances   Complete by: As directed    Call MD for:  extreme fatigue   Complete by: As directed    Call MD for:  persistant dizziness or light-headedness   Complete by: As directed    Call MD for:  persistant nausea and vomiting   Complete by: As directed    Call MD for:  severe uncontrolled pain   Complete by: As directed    Call MD for:  temperature >100.4   Complete by: As directed    Diet - low sodium heart healthy   Complete by: As directed    Discharge instructions   Complete by: As directed    You were cared for by a hospitalist during your hospital stay. If you have any questions about your discharge medications or the care you received  while you were in the hospital after you are discharged, you can call the unit and ask to speak with the hospitalist on call if the hospitalist that took care of you is not available. Once you are discharged, your primary care physician will handle any further medical issues. Please note that NO REFILLS for any discharge medications will be authorized once you are discharged, as it is imperative that you return to your primary care physician (or establish a relationship with a primary care physician if you do not have one) for your aftercare needs so that they can reassess your need for medications and monitor your lab values.   Increase activity slowly   Complete by: As directed       Allergies as of 05/11/2020      Reactions   Amoxicillin Other (See Comments)   Patient develops a very bad yeast infection. Did it involve swelling of the face/tongue/throat, SOB, or low BP? No Did it involve sudden or severe rash/hives, skin peeling, or any reaction on the inside of your mouth or nose? No Did you need to seek medical attention at a hospital or doctor's office? No When did it last happen?many years If all above answers are "NO", may proceed with cephalosporin use.      Medication List    STOP taking these medications   aspirin EC 81 MG tablet   ciprofloxacin 500 MG tablet Commonly known as: CIPRO   montelukast 10 MG tablet Commonly known as: SINGULAIR   valACYclovir 1000 MG tablet Commonly known as: VALTREX     TAKE these medications   ALPRAZolam 0.5 MG tablet Commonly known as: XANAX TAKE 1 TABLET BY MOUTH AT BEDTIME AS NEEDED FOR ANXIETY   atorvastatin 40 MG tablet Commonly known as: LIPITOR TAKE 1 TABLET BY MOUTH EVERY DAY IN THE EVENING   cetirizine 10 MG tablet Commonly known as: ZYRTEC Take 1 tablet (10 mg total) by mouth daily.   Eliquis 5 MG Tabs tablet Generic drug: apixaban TAKE 1 TABLET BY MOUTH TWICE A DAY   fluticasone 50 MCG/ACT nasal spray Commonly known as: FLONASE Place 1 spray into both nostrils in the morning and at bedtime.   levothyroxine 125 MCG tablet Commonly known as: SYNTHROID TAKE 1 TABLET BY MOUTH EVERY DAY What changed:   how much to take  when to take this   meclizine 25 MG tablet Commonly known as: ANTIVERT Take 1 tablet (25 mg total) by mouth 3 (three) times daily as needed for dizziness.   metoprolol succinate 50 MG 24 hr tablet Commonly known as: TOPROL-XL TAKE 1 TABLET BY MOUTH EVERY DAY   tiZANidine 4 MG tablet Commonly known as: Zanaflex Take 1 tablet (4 mg total) by mouth every 8 (eight) hours as needed for muscle spasms.       Follow-up Information    Midge Minium, MD.  Schedule an appointment as soon as possible for a visit in 1 week(s).   Specialty: Family Medicine Contact information: [address] [place] [address] [phone]        Leta Baptist, MD. Call on 05/13/2020.   Specialty: Otolaryngology Why: Call on Monday to make an appointment with Dr. Benjamine Mola. I have discussed your case with him while you were hospitalized.  Contact information: [address] [place] [address] [phone]              Allergies  Allergen Reactions  . Amoxicillin Other (See Comments)  Patient develops a very bad yeast infection. Did it involve swelling of the face/tongue/throat, SOB, or low BP? No Did it involve sudden or severe rash/hives, skin peeling, or any reaction on the inside of your mouth or nose? No Did you need to seek medical attention at a hospital or doctor's office? No When did it last happen?many years If all above answers are "NO", may proceed with cephalosporin use.     Consultations:  None   Procedures/Studies: CT HEAD WO CONTRAST  Result Date: 05/10/2020 CLINICAL DATA:  Mental status change, unknown cause Dizziness, weakness and vomiting. Right-sided headache for 1 month, progressive over the last few days. EXAM: CT HEAD WITHOUT CONTRAST TECHNIQUE: Contiguous axial images were obtained from the base of the skull through the vertex without intravenous contrast. COMPARISON:  Head CT 08/09/2015, brain MRI 08/10/2015 FINDINGS: Brain: Stable generalized atrophy. Chronic small vessel ischemia with progression from 2016. Remote lacunar infarcts in the left basal ganglia. No intracranial hemorrhage, mass effect, or midline shift. No hydrocephalus. The basilar cisterns are patent. No evidence of territorial infarct or acute ischemia. No extra-axial or intracranial fluid collection. Vascular: Atherosclerosis of skullbase vasculature without hyperdense vessel or abnormal calcification. Skull: No fracture or focal lesion.  Sinuses/Orbits: Chronic but progressive opacification of the right maxillary sinus with hyperdense material centrally and increased cortical thickening from prior exam. Remaining paranasal sinuses are clear. No acute orbital abnormality. Other: None. IMPRESSION: 1. No acute intracranial abnormality. 2. Generalized atrophy. Progressive chronic small vessel ischemia from 2016 comparison. 3. Chronic but progressive opacification of the right maxillary sinus with hyperdense material centrally and increased cortical thickening from prior exam. Electronically Signed   By: Keith Rake M.D.   On: 05/10/2020 20:44   MR BRAIN WO CONTRAST  Result Date: 05/10/2020 CLINICAL DATA:  Dizziness EXAM: MRI HEAD WITHOUT CONTRAST TECHNIQUE: Multiplanar, multiecho pulse sequences of the brain and surrounding structures were obtained without intravenous contrast. COMPARISON:  05/10/2020 head CT.  08/10/2015 MRI head and prior. FINDINGS: Brain: No diffusion-weighted signal abnormality. No intracranial hemorrhage. Moderate cerebral atrophy with ex vacuo dilatation. Advanced chronic microvascular ischemic changes. Sequela of remote left corona radiata lacunar insult. No midline shift, ventriculomegaly or extra-axial fluid collection. No mass lesion. Vascular: Grossly preserved proximal major intracranial flow voids. Skull and upper cervical spine: Normal marrow signal. Sinuses/Orbits: Normal orbits. Moderate right maxillary sinus mucosal thickening with central T1 hyper/T2 hypointense signal. Trace bilateral mastoid effusion. Other: None. IMPRESSION: No acute intracranial process.  Remote left corona radiata insult. Moderate cerebral atrophy. Advanced chronic microvascular ischemic changes. 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 Electronically Signed   By: Primitivo Gauze M.D.   On: 05/10/2020 22:05       Discharge Exam: Vitals:   05/11/20 0315 05/11/20 0615  BP: (!) 161/77 (!)  160/70  Pulse: 75 73  Resp: 18 16  Temp: 97.8 F (36.6 C) 97.9 F (36.6 C)  SpO2: 99% 100%    General: Pt is alert, awake, not in acute distress Cardiovascular: RRR, S1/S2 +, no edema Respiratory: CTA bilaterally, no wheezing, no rhonchi, no respiratory distress, no conversational dyspnea  Abdominal: Soft, NT, ND, bowel sounds + Extremities: no edema, no cyanosis Psych: Normal mood and affect, stable judgement and insight     The results of significant diagnostics from this hospitalization (including imaging, microbiology, ancillary and laboratory) are listed below for reference.     Microbiology: Recent Results (from the past 240 hour(s))  Respiratory Panel by RT PCR (  Flu A&B, Covid) - Nasopharyngeal Swab     Status: None   Collection Time: 05/11/20  2:10 AM   Specimen: Nasopharyngeal Swab  Result Value Ref Range Status   SARS Coronavirus 2 by RT PCR NEGATIVE NEGATIVE Final    Comment: (NOTE) SARS-CoV-2 target nucleic acids are NOT DETECTED.  The SARS-CoV-2 RNA is generally detectable in upper respiratoy specimens during the acute phase of infection. The lowest concentration of SARS-CoV-2 viral copies this assay can detect is 131 copies/mL. A negative result does not preclude SARS-Cov-2 infection and should not be used as the sole basis for treatment or other patient management decisions. A negative result may occur with  improper specimen collection/handling, submission of specimen other than nasopharyngeal swab, presence of viral mutation(s) within the areas targeted by this assay, and inadequate number of viral copies (<131 copies/mL). A negative result must be combined with clinical observations, patient history, and epidemiological information. The expected result is Negative.  Fact Sheet for Patients:  PinkCheek.be  Fact Sheet for Healthcare Providers:  GravelBags.it  This test is no t yet approved or  cleared by the [place] FDA and  has been authorized for detection and/or diagnosis of SARS-CoV-2 by FDA under an Emergency Use Authorization (EUA). This EUA will remain  in effect (meaning this test can be used) for the duration of the COVID-19 declaration under Section 564(b)(1) of the Act, 21 U.S.C. section 360bbb-3(b)(1), unless the authorization is terminated or revoked sooner.     Influenza A by PCR NEGATIVE NEGATIVE Final   Influenza B by PCR NEGATIVE NEGATIVE Final    Comment: (NOTE) The Xpert Xpress SARS-CoV-2/FLU/RSV assay is intended as an aid in  the diagnosis of influenza from Nasopharyngeal swab specimens and  should not be used as a sole basis for treatment. Nasal washings and  aspirates are unacceptable for Xpert Xpress SARS-CoV-2/FLU/RSV  testing.  Fact Sheet for Patients: PinkCheek.be  Fact Sheet for Healthcare Providers: GravelBags.it  This test is not yet approved or cleared by the [place] FDA and  has been authorized for detection and/or diagnosis of SARS-CoV-2 by  FDA under an Emergency Use Authorization (EUA). This EUA will remain  in effect (meaning this test can be used) for the duration of the  Covid-19 declaration under Section 564(b)(1) of the Act, 21  U.S.C. section 360bbb-3(b)(1), unless the authorization is  terminated or revoked. Performed at [facility], [place] [address] [place], [place] [address]      Labs: BNP (last 3 results) No results for input(s): BNP in the last 8760 hours. Basic Metabolic Panel: Recent Labs  Lab 05/10/20 2009 05/11/20 0354  NA 136 135  K 3.3* 4.2  CL 100 98  CO2 24 26  GLUCOSE 139* 141*  BUN 19 21  CREATININE 0.99 0.98  CALCIUM 9.3 9.4   Liver Function Tests: Recent Labs  Lab 05/10/20 2009 05/11/20 0354  AST 37 35  ALT 27 26  ALKPHOS 83 81  BILITOT 0.5 0.5  PROT 7.6 7.6  ALBUMIN 4.1 3.8   No results for  input(s): LIPASE, AMYLASE in the last 168 hours. No results for input(s): AMMONIA in the last 168 hours. CBC: Recent Labs  Lab 05/10/20 2009 05/11/20 0354  WBC 8.0 8.7  HGB 12.7 12.5  HCT 37.6 37.5  MCV 87.6 89.5  PLT 163 177   Cardiac Enzymes: No results for input(s): CKTOTAL, CKMB, CKMBINDEX, TROPONINI in the last 168 hours. BNP: Invalid input(s): POCBNP CBG: No results for  input(s): GLUCAP in the last 168 hours. D-Dimer No results for input(s): DDIMER in the last 72 hours. Hgb A1c No results for input(s): HGBA1C in the last 72 hours. Lipid Profile No results for input(s): CHOL, HDL, LDLCALC, TRIG, CHOLHDL, LDLDIRECT in the last 72 hours. Thyroid function studies No results for input(s): TSH, T4TOTAL, T3FREE, THYROIDAB in the last 72 hours.  Invalid input(s): FREET3 Anemia work up No results for input(s): VITAMINB12, FOLATE, FERRITIN, TIBC, IRON, RETICCTPCT in the last 72 hours. Urinalysis    Component Value Date/Time   COLORURINE YELLOW 04/03/2019 [place] 04/03/2019 1541   LABSPEC 1.020 04/03/2019 1541   PHURINE 7.0 04/03/2019 1541   GLUCOSEU NEGATIVE 04/03/2019 1541   HGBUR NEGATIVE 04/03/2019 1541   BILIRUBINUR negative 03/07/2020 1335   KETONESUR NEGATIVE 04/03/2019 1541   PROTEINUR Positive (A) 03/07/2020 1335   PROTEINUR NEGATIVE 04/03/2019 1541   UROBILINOGEN 0.2 03/07/2020 1335   UROBILINOGEN 0.2 03/24/2013 1644   NITRITE + 03/07/2020 1335   NITRITE NEGATIVE 04/03/2019 1541   LEUKOCYTESUR Small (1+) (A) 03/07/2020 1335   LEUKOCYTESUR NEGATIVE 04/03/2019 1541   Sepsis Labs Invalid input(s): PROCALCITONIN,  WBC,  LACTICIDVEN Microbiology Recent Results (from the past 240 hour(s))  Respiratory Panel by RT PCR (Flu A&B, Covid) - Nasopharyngeal Swab     Status: None   Collection Time: 05/11/20  2:10 AM   Specimen: Nasopharyngeal Swab  Result Value Ref Range Status   SARS Coronavirus 2 by RT PCR NEGATIVE NEGATIVE Final    Comment:  (NOTE) SARS-CoV-2 target nucleic acids are NOT DETECTED.  The SARS-CoV-2 RNA is generally detectable in upper respiratoy specimens during the acute phase of infection. The lowest concentration of SARS-CoV-2 viral copies this assay can detect is 131 copies/mL. A negative result does not preclude SARS-Cov-2 infection and should not be used as the sole basis for treatment or other patient management decisions. A negative result may occur with  improper specimen collection/handling, submission of specimen other than nasopharyngeal swab, presence of viral mutation(s) within the areas targeted by this assay, and inadequate number of viral copies (<131 copies/mL). A negative result must be combined with clinical observations, patient history, and epidemiological information. The expected result is Negative.  Fact Sheet for Patients:  PinkCheek.be  Fact Sheet for Healthcare Providers:  GravelBags.it  This test is no t yet approved or cleared by the [place] FDA and  has been authorized for detection and/or diagnosis of SARS-CoV-2 by FDA under an Emergency Use Authorization (EUA). This EUA will remain  in effect (meaning this test can be used) for the duration of the COVID-19 declaration under Section 564(b)(1) of the Act, 21 U.S.C. section 360bbb-3(b)(1), unless the authorization is terminated or revoked sooner.     Influenza A by PCR NEGATIVE NEGATIVE Final   Influenza B by PCR NEGATIVE NEGATIVE Final    Comment: (NOTE) The Xpert Xpress SARS-CoV-2/FLU/RSV assay is intended as an aid in  the diagnosis of influenza from Nasopharyngeal swab specimens and  should not be used as a sole basis for treatment. Nasal washings and  aspirates are unacceptable for Xpert Xpress SARS-CoV-2/FLU/RSV  testing.  Fact Sheet for Patients: PinkCheek.be  Fact Sheet for Healthcare  Providers: GravelBags.it  This test is not yet approved or cleared by the [place] FDA and  has been authorized for detection and/or diagnosis of SARS-CoV-2 by  FDA under an Emergency Use Authorization (EUA). This EUA will remain  in effect (meaning this test can be used) for  the duration of the  Covid-19 declaration under Section 564(b)(1) of the Act, 21  U.S.C. section 360bbb-3(b)(1), unless the authorization is  terminated or revoked. Performed at [facility], [place] [address], [place], [place] [address]      Patient was seen and examined on the day of discharge and was found to be in stable condition. Time coordinating discharge: 40 minutes including assessment and coordination of care, as well as examination of the patient.   SIGNED:  Dessa Phi, DO [facility] 05/11/2020, 12:09 PM

## 2020-05-13 ENCOUNTER — Telehealth: Payer: Self-pay

## 2020-05-13 NOTE — Telephone Encounter (Signed)
TCM call attempted x 2. Left message for patient to call back. Will try again tomorrow.

## 2020-05-14 ENCOUNTER — Other Ambulatory Visit: Payer: Self-pay

## 2020-05-14 ENCOUNTER — Telehealth (INDEPENDENT_AMBULATORY_CARE_PROVIDER_SITE_OTHER): Payer: Medicare Other | Admitting: Family Medicine

## 2020-05-14 ENCOUNTER — Telehealth: Payer: Self-pay

## 2020-05-14 ENCOUNTER — Encounter: Payer: Self-pay | Admitting: Family Medicine

## 2020-05-14 DIAGNOSIS — J320 Chronic maxillary sinusitis: Secondary | ICD-10-CM | POA: Diagnosis not present

## 2020-05-14 DIAGNOSIS — N390 Urinary tract infection, site not specified: Secondary | ICD-10-CM | POA: Insufficient documentation

## 2020-05-14 DIAGNOSIS — J3089 Other allergic rhinitis: Secondary | ICD-10-CM

## 2020-05-14 DIAGNOSIS — R42 Dizziness and giddiness: Secondary | ICD-10-CM

## 2020-05-14 DIAGNOSIS — B49 Unspecified mycosis: Secondary | ICD-10-CM | POA: Diagnosis not present

## 2020-05-14 NOTE — Progress Notes (Addendum)
Virtual Visit via Video   I connected with patient on 05/14/20 at  1:30 PM EDT by a video enabled telemedicine application and verified that I am speaking with the correct person using two identifiers.  Location patient: Home Location provider: Acupuncturist, Office Persons participating in the virtual visit: Patient, Provider, [facility] (Jess B)  I discussed the limitations of evaluation and management by telemedicine and the availability of in person appointments. The patient expressed understanding and agreed to proceed.  Interactive audio and video telecommunications were attempted between this provider and patient, however failed, due to patient having technical difficulties OR patient did not have access to video capability.  We continued and completed visit with audio only.   Subjective:   HPI:   Hospital f/u- pt was admitted 9/24-9/25 w/ dizziness.  CT head and neck did not show any acute changes and MRI showed severe and chronic R maxillary sinusitis.  The possibility of fungal sinusitis was mentioned.  At the time of hospitalization she was unable to walk and had fallen multiple times in the last 2 days.  By the next morning, sxs had resolved.  She had a PT evaluation and they did not recommend any outpt or ongoing services.  She was to f/u w/ Dr Benjamine Mola for both her vertigo and sinusitis  Pt reports she continues to have some dizziness when she is up and moving around.  Pt reports Meclizine is helping.  Pt was unable to schedule w/ Dr Benjamine Mola bc he does not take [employer].  Pt was told to stop her Cipro at time of hospital d/c- which she was started on prior to hospitalization for UTI.  But she states she was called by someone and told to restart b/c she 'had a pretty bad infection'.  I don't see any urine culture from her hospitalization.  Pt has frequent urinary issues but has repeatedly turned down Urology referral.  ROS:   See pertinent positives and negatives per  HPI.  Patient Active Problem List   Diagnosis Date Noted  . Vertigo 05/11/2020  . Allergic fungal sinusitis 05/11/2020  . Headache 05/11/2020  . Allergic rhinitis 05/01/2019  . Long term (current) use of anticoagulants 09/22/2018  . Carotid artery stenosis 12/03/2016  . Weakness 05/06/2016  . Overweight (BMI 25.0-29.9) 05/06/2016  . Chest pain with high risk for cardiac etiology 01/24/2016  . Paroxysmal atrial fibrillation ([place]) 01/08/2016  . History of colonic polyps 12/12/2015  . Cryptogenic stroke ([place]) 09/13/2015  . Insomnia 08/10/2015  . Vitamin D deficiency 06/21/2015  . B12 deficiency 09/25/2013  . Cerebrovascular disease 06/13/2011  . Essential hypertension 06/13/2011  . Hyperlipidemia   . Postsurgical hypothyroidism   . Coronary artery disease of native artery of native heart with stable angina pectoris [facility])     Social History   Tobacco Use  . Smoking status: Former Smoker    Packs/day: 0.25    Years: 5.00    Pack years: 1.25    Types: Cigarettes    Quit date: 11/15/1968    Years since quitting: 51.5  . Smokeless tobacco: Never Used  Substance Use Topics  . Alcohol use: No    Alcohol/week: 0.0 standard drinks    Current Outpatient Medications:  .  ALPRAZolam (XANAX) 0.5 MG tablet, TAKE 1 TABLET BY MOUTH AT BEDTIME AS NEEDED FOR ANXIETY, Disp: 30 tablet, Rfl: 1 .  atorvastatin (LIPITOR) 40 MG tablet, TAKE 1 TABLET BY MOUTH EVERY DAY IN THE EVENING, Disp: 90 tablet, Rfl: 1 .  cetirizine (ZYRTEC) 10 MG tablet, Take 1 tablet (10 mg total) by mouth daily., Disp: 30 tablet, Rfl: 11 .  ELIQUIS 5 MG TABS tablet, TAKE 1 TABLET BY MOUTH TWICE A DAY, Disp: 180 tablet, Rfl: 1 .  fluticasone (FLONASE) 50 MCG/ACT nasal spray, Place 1 spray into both nostrils in the morning and at bedtime., Disp: 16 g, Rfl: 5 .  levothyroxine (SYNTHROID) 125 MCG tablet, TAKE 1 TABLET BY MOUTH EVERY DAY (Patient taking differently: Take 112 mcg by mouth. ), Disp: 90 tablet, Rfl: 1 .   losartan-hydrochlorothiazide (HYZAAR) 50-12.5 MG tablet, Take 1 tablet by mouth daily., Disp: , Rfl:  .  meclizine (ANTIVERT) 25 MG tablet, Take 1 tablet (25 mg total) by mouth 3 (three) times daily as needed for dizziness., Disp: 30 tablet, Rfl: 0 .  metoprolol succinate (TOPROL-XL) 50 MG 24 hr tablet, TAKE 1 TABLET BY MOUTH EVERY DAY, Disp: 90 tablet, Rfl: 2 .  tiZANidine (ZANAFLEX) 4 MG tablet, Take 1 tablet (4 mg total) by mouth every 8 (eight) hours as needed for muscle spasms., Disp: 30 tablet, Rfl: 0  Allergies  Allergen Reactions  . Amoxicillin Other (See Comments)    Patient develops a very bad yeast infection. Did it involve swelling of the face/tongue/throat, SOB, or low BP? No Did it involve sudden or severe rash/hives, skin peeling, or any reaction on the inside of your mouth or nose? No Did you need to seek medical attention at a hospital or doctor's office? No When did it last happen?many years If all above answers are "NO", may proceed with cephalosporin use.     Objective:   There were no vitals taken for this visit. Pt is able to speak clearly, coherently without shortness of breath or increased work of breathing. Thought process is linear.  Mood is appropriate.   Assessment and Plan:   Allergic Fungal sinusitis- the possibility of fungal sinusitis was mentioned based on pt's MRI done in the hospital.  She was not started on any medication but was encouraged to f/u w/ ENT as outpt.  Unfortunately Dr Deeann Saint office doesn't take her insurance so we will refer to a different ENT  Chronic maxillary sinusitis- identified on MRI.  No abx started during hospitalization.  Will refer to ENT  Recurrent UTI- pt has repeatedly turned down urology referral.  At this time, given the amount of infxns and need for antibiotics, I told her I was placing the referral and she needed to go.  Pt was in agreement this time.   [name], MD 05/14/2020  Time spent with the  patient: 12 minutes, of which >50% was spent in obtaining information about symptoms, reviewing previous labs, evaluations, and treatments, counseling about condition (please see the discussed topics above), and developing a plan to further investigate it; had a number of questions which I addressed.

## 2020-05-14 NOTE — Telephone Encounter (Signed)
Unable to reach patient for TCM call

## 2020-05-14 NOTE — Progress Notes (Signed)
I have discussed the procedure for the virtual visit with the patient who has given consent to proceed with assessment and treatment.   Pt unable to obtain vitals.   Riya Huxford L Laruth Hanger, CMA     

## 2020-05-14 NOTE — Telephone Encounter (Signed)
Transition Care Management Unsuccessful Follow-up Telephone Call  Date of discharge and from where:  05/11/20-[place]  Attempts:  3rd Attempt  Reason for unsuccessful TCM follow-up call:  Unable to leave message

## 2020-05-16 ENCOUNTER — Telehealth: Payer: Self-pay

## 2020-05-16 NOTE — Telephone Encounter (Signed)
Her potassium at discharge was normal and she doesn't need meds at this time

## 2020-05-16 NOTE — Telephone Encounter (Signed)
Patient notified of PCP recommendations and is agreement and expresses an understanding.  

## 2020-05-16 NOTE — Telephone Encounter (Signed)
Please advise 

## 2020-05-16 NOTE — Telephone Encounter (Signed)
Patient states she had a virtual visit with Dr. Birdie Riddle on 9/28.  States that while she was in the hospital they had informed her that her potassium was low.  Patient would like to know if she needs to be on medications?  States she forgot to address this.

## 2020-06-03 ENCOUNTER — Telehealth: Payer: Self-pay | Admitting: Family Medicine

## 2020-06-03 NOTE — Telephone Encounter (Signed)
She needs an appointment.

## 2020-06-03 NOTE — Telephone Encounter (Signed)
Patient is having hair loss - please advise

## 2020-06-13 ENCOUNTER — Ambulatory Visit (INDEPENDENT_AMBULATORY_CARE_PROVIDER_SITE_OTHER): Payer: Medicare Other | Admitting: Family Medicine

## 2020-06-13 ENCOUNTER — Other Ambulatory Visit: Payer: Self-pay

## 2020-06-13 ENCOUNTER — Encounter: Payer: Self-pay | Admitting: Family Medicine

## 2020-06-13 VITALS — BP 126/72 | HR 64 | Temp 97.6°F | Resp 16 | Wt 147.8 lb

## 2020-06-13 DIAGNOSIS — I25118 Atherosclerotic heart disease of native coronary artery with other forms of angina pectoris: Secondary | ICD-10-CM | POA: Diagnosis not present

## 2020-06-13 DIAGNOSIS — L659 Nonscarring hair loss, unspecified: Secondary | ICD-10-CM

## 2020-06-13 DIAGNOSIS — Z23 Encounter for immunization: Secondary | ICD-10-CM

## 2020-06-13 LAB — CBC WITH DIFFERENTIAL/PLATELET
Basophils Absolute: 0 10*3/uL (ref 0.0–0.1)
Basophils Relative: 0.3 % (ref 0.0–3.0)
Eosinophils Absolute: 0.2 10*3/uL (ref 0.0–0.7)
Eosinophils Relative: 2.4 % (ref 0.0–5.0)
HCT: 38 % (ref 36.0–46.0)
Hemoglobin: 13 g/dL (ref 12.0–15.0)
Lymphocytes Relative: 42.6 % (ref 12.0–46.0)
Lymphs Abs: 3.9 10*3/uL (ref 0.7–4.0)
MCHC: 34.3 g/dL (ref 30.0–36.0)
MCV: 84.6 fl (ref 78.0–100.0)
Monocytes Absolute: 0.7 10*3/uL (ref 0.1–1.0)
Monocytes Relative: 7.4 % (ref 3.0–12.0)
Neutro Abs: 4.3 10*3/uL (ref 1.4–7.7)
Neutrophils Relative %: 47.3 % (ref 43.0–77.0)
Platelets: 177 10*3/uL (ref 150.0–400.0)
RBC: 4.49 Mil/uL (ref 3.87–5.11)
RDW: 13.8 % (ref 11.5–15.5)
WBC: 9.1 10*3/uL (ref 4.0–10.5)

## 2020-06-13 LAB — T4, FREE: Free T4: 1.96 ng/dL — ABNORMAL HIGH (ref 0.60–1.60)

## 2020-06-13 LAB — T3, FREE: T3, Free: 5.5 pg/mL — ABNORMAL HIGH (ref 2.3–4.2)

## 2020-06-13 LAB — TSH: TSH: 4.2 u[IU]/mL (ref 0.35–4.50)

## 2020-06-13 NOTE — Patient Instructions (Signed)
Follow up as needed or as scheduled We'll notify you of your lab results and make any changes if needed Call with any questions or concerns Stay Safe!  Stay Healthy!

## 2020-06-13 NOTE — Progress Notes (Signed)
   Subjective:    Patient ID: Colleen Wright, female    DOB: 02/20/1941, 79 y.o.   MRN: [id number]  HPI Hair loss- 'my hair's just coming out'.  sxs started ~1 month ago.  'it comes out all day long'.  No obvious bald spots.  No new or different medication.  Pt does have hx of hypothyroid.  Washing hair 1x/week- notes increased hair loss.  Has appt upcoming w/ Dermatology in December   Review of Systems For ROS see HPI   This visit occurred during the SARS-CoV-2 public health emergency.  Safety protocols were in place, including screening questions prior to the visit, additional usage of staff PPE, and extensive cleaning of exam room while observing appropriate contact time as indicated for disinfecting solutions.       Objective:   Physical Exam Vitals reviewed.  Constitutional:      General: She is not in acute distress.    Appearance: Normal appearance. She is not ill-appearing.  HENT:     Head: Normocephalic and atraumatic.  Neck:     Comments: No thyromegaly or nodularity Musculoskeletal:     Cervical back: Normal range of motion and neck supple.  Lymphadenopathy:     Cervical: No cervical adenopathy.  Skin:    General: Skin is warm and dry.     Comments: Thinning hair at crown of head  Neurological:     General: No focal deficit present.     Mental Status: She is alert and oriented to person, place, and time.  Psychiatric:        Mood and Affect: Mood normal.        Behavior: Behavior normal.        Thought Content: Thought content normal.           Assessment & Plan:  Hair loss- new.  Pt reports sxs have acutely changed/worsened in the last month.  No med changes during this time.  Pt denies change in stress level.  Check thyroid and iron panel.  There may be a metabolic cause or this may be age related.  Has dermatology appt upcoming.  Will follow.

## 2020-06-14 ENCOUNTER — Other Ambulatory Visit: Payer: Self-pay | Admitting: General Practice

## 2020-06-14 DIAGNOSIS — E890 Postprocedural hypothyroidism: Secondary | ICD-10-CM

## 2020-06-14 LAB — IRON,TIBC AND FERRITIN PANEL
%SAT: 29 % (calc) (ref 16–45)
Ferritin: 164 ng/mL (ref 16–288)
Iron: 94 ug/dL (ref 45–160)
TIBC: 323 mcg/dL (calc) (ref 250–450)

## 2020-06-14 MED ORDER — LEVOTHYROXINE SODIUM 112 MCG PO TABS: 112.0000 ug | ORAL_TABLET | Freq: Every day | ORAL | 3 refills | Status: DC

## 2020-07-05 DIAGNOSIS — L650 Telogen effluvium: Secondary | ICD-10-CM | POA: Diagnosis not present

## 2020-07-05 DIAGNOSIS — L570 Actinic keratosis: Secondary | ICD-10-CM | POA: Diagnosis not present

## 2020-07-05 DIAGNOSIS — L821 Other seborrheic keratosis: Secondary | ICD-10-CM | POA: Diagnosis not present

## 2020-07-05 DIAGNOSIS — D485 Neoplasm of uncertain behavior of skin: Secondary | ICD-10-CM | POA: Diagnosis not present

## 2020-07-05 DIAGNOSIS — D0462 Carcinoma in situ of skin of left upper limb, including shoulder: Secondary | ICD-10-CM | POA: Diagnosis not present

## 2020-07-05 DIAGNOSIS — D1801 Hemangioma of skin and subcutaneous tissue: Secondary | ICD-10-CM | POA: Diagnosis not present

## 2020-07-05 DIAGNOSIS — Z79899 Other long term (current) drug therapy: Secondary | ICD-10-CM | POA: Diagnosis not present

## 2020-07-05 DIAGNOSIS — L918 Other hypertrophic disorders of the skin: Secondary | ICD-10-CM | POA: Diagnosis not present

## 2020-07-05 DIAGNOSIS — Z85828 Personal history of other malignant neoplasm of skin: Secondary | ICD-10-CM | POA: Diagnosis not present

## 2020-07-08 DIAGNOSIS — L650 Telogen effluvium: Secondary | ICD-10-CM | POA: Diagnosis not present

## 2020-07-08 DIAGNOSIS — D485 Neoplasm of uncertain behavior of skin: Secondary | ICD-10-CM | POA: Diagnosis not present

## 2020-07-08 DIAGNOSIS — L821 Other seborrheic keratosis: Secondary | ICD-10-CM | POA: Diagnosis not present

## 2020-07-08 DIAGNOSIS — L570 Actinic keratosis: Secondary | ICD-10-CM | POA: Diagnosis not present

## 2020-07-08 DIAGNOSIS — D1801 Hemangioma of skin and subcutaneous tissue: Secondary | ICD-10-CM | POA: Diagnosis not present

## 2020-07-08 DIAGNOSIS — Z85828 Personal history of other malignant neoplasm of skin: Secondary | ICD-10-CM | POA: Diagnosis not present

## 2020-07-08 DIAGNOSIS — Z79899 Other long term (current) drug therapy: Secondary | ICD-10-CM | POA: Diagnosis not present

## 2020-07-08 DIAGNOSIS — L918 Other hypertrophic disorders of the skin: Secondary | ICD-10-CM | POA: Diagnosis not present

## 2020-07-15 ENCOUNTER — Ambulatory Visit: Payer: Medicare Other

## 2020-07-16 ENCOUNTER — Ambulatory Visit: Payer: Medicare Other

## 2020-07-17 ENCOUNTER — Other Ambulatory Visit: Payer: Self-pay

## 2020-07-17 ENCOUNTER — Ambulatory Visit (INDEPENDENT_AMBULATORY_CARE_PROVIDER_SITE_OTHER): Payer: Medicare Other

## 2020-07-17 DIAGNOSIS — E890 Postprocedural hypothyroidism: Secondary | ICD-10-CM | POA: Diagnosis not present

## 2020-07-18 ENCOUNTER — Ambulatory Visit: Payer: Medicare Other

## 2020-07-18 LAB — TSH: TSH: 2.82 u[IU]/mL (ref 0.35–4.50)

## 2020-07-18 LAB — T4, FREE: Free T4: 1.13 ng/dL (ref 0.60–1.60)

## 2020-07-18 LAB — T3, FREE: T3, Free: 2.7 pg/mL (ref 2.3–4.2)

## 2020-08-20 ENCOUNTER — Other Ambulatory Visit: Payer: Self-pay | Admitting: Family Medicine

## 2020-08-20 DIAGNOSIS — I10 Essential (primary) hypertension: Secondary | ICD-10-CM

## 2020-09-26 DIAGNOSIS — D0462 Carcinoma in situ of skin of left upper limb, including shoulder: Secondary | ICD-10-CM | POA: Diagnosis not present

## 2020-09-26 DIAGNOSIS — L820 Inflamed seborrheic keratosis: Secondary | ICD-10-CM | POA: Diagnosis not present

## 2020-09-26 DIAGNOSIS — Z85828 Personal history of other malignant neoplasm of skin: Secondary | ICD-10-CM | POA: Diagnosis not present

## 2020-10-24 ENCOUNTER — Other Ambulatory Visit: Payer: Self-pay | Admitting: Family Medicine

## 2020-10-29 ENCOUNTER — Other Ambulatory Visit: Payer: Self-pay | Admitting: Family Medicine

## 2020-10-30 ENCOUNTER — Other Ambulatory Visit: Payer: Self-pay | Admitting: Family Medicine

## 2020-10-30 NOTE — Telephone Encounter (Signed)
Requesting:Xanax  Contract: UDS: Last Visit:06/13/20 Next Visit:n/a Last Refill:was discontinued on 06/13/20  Please Advise

## 2020-11-10 ENCOUNTER — Other Ambulatory Visit: Payer: Self-pay | Admitting: Family Medicine

## 2020-12-02 ENCOUNTER — Ambulatory Visit: Payer: Medicare Other

## 2020-12-05 ENCOUNTER — Other Ambulatory Visit: Payer: Self-pay | Admitting: Family Medicine

## 2020-12-28 ENCOUNTER — Other Ambulatory Visit: Payer: Self-pay | Admitting: Family Medicine

## 2021-01-13 ENCOUNTER — Other Ambulatory Visit: Payer: Self-pay | Admitting: Family Medicine

## 2021-01-15 ENCOUNTER — Other Ambulatory Visit: Payer: Self-pay | Admitting: Cardiovascular Disease

## 2021-01-24 ENCOUNTER — Other Ambulatory Visit: Payer: Self-pay | Admitting: Family Medicine

## 2021-01-31 ENCOUNTER — Other Ambulatory Visit: Payer: Self-pay | Admitting: Family Medicine

## 2021-01-31 ENCOUNTER — Other Ambulatory Visit: Payer: Self-pay

## 2021-01-31 ENCOUNTER — Other Ambulatory Visit: Payer: Self-pay | Admitting: Internal Medicine

## 2021-01-31 DIAGNOSIS — J310 Chronic rhinitis: Secondary | ICD-10-CM

## 2021-01-31 NOTE — Telephone Encounter (Signed)
OK to refill? Last seen in October for hair loss

## 2021-02-07 ENCOUNTER — Telehealth (INDEPENDENT_AMBULATORY_CARE_PROVIDER_SITE_OTHER): Payer: Medicare Other | Admitting: Registered Nurse

## 2021-02-07 ENCOUNTER — Encounter: Payer: Self-pay | Admitting: Registered Nurse

## 2021-02-07 ENCOUNTER — Other Ambulatory Visit: Payer: Self-pay

## 2021-02-07 DIAGNOSIS — U071 COVID-19: Secondary | ICD-10-CM | POA: Diagnosis not present

## 2021-02-07 MED ORDER — MOLNUPIRAVIR EUA 200MG CAPSULE: 4.0000 | ORAL_CAPSULE | Freq: Two times a day (BID) | ORAL | 0 refills | Status: AC

## 2021-02-07 NOTE — Patient Instructions (Signed)
° ° ° °  If you have lab work done today you will be contacted with your lab results within the next 2 weeks.  If you have not heard from us then please contact us. The fastest way to get your results is to register for My Chart. ° ° °IF you received an x-ray today, you will receive an invoice from [facility]. Please contact [facility] at [phone] with questions or concerns regarding your invoice.  ° °IF you received labwork today, you will receive an invoice from [facility]. Please contact [facility] at [phone] with questions or concerns regarding your invoice.  ° °Our billing staff will not be able to assist you with questions regarding bills from these companies. ° °You will be contacted with the lab results as soon as they are available. The fastest way to get your results is to activate your My Chart account. Instructions are located on the last page of this paperwork. If you have not heard from us regarding the results in 2 weeks, please contact this office. °  ° ° ° °

## 2021-02-07 NOTE — Progress Notes (Signed)
Telemedicine Encounter- SOAP NOTE Established Patient  This telephone encounter was conducted with the patient's (or proxy's) verbal consent via audio telecommunications: yes/no: Yes Patient was instructed to have this encounter in a suitably private space; and to only have persons present to whom they give permission to participate. In addition, patient identity was confirmed by use of name plus two identifiers (DOB and address).  I discussed the limitations, risks, security and privacy concerns of performing an evaluation and management service by telephone and the availability of in person appointments. I also discussed with the patient that there may be a patient responsible charge related to this service. The patient expressed understanding and agreed to proceed.  I spent a total of TIME; 0 MIN TO 60 MIN: 15 minutes talking with the patient or their proxy.  Patient at home Provider in office  Participants: Kathrin Ruddy, NP and [name]  Chief Complaint  Patient presents with   Covid Positive    Patient states she tested positive for covid yesterday. She is experiencing body aches , sore throat and sick to her stomach. She has taken some tylenol but would like to discuss what medication she needs to take.    Subjective   Colleen Wright is a 80 y.o. established patient. Telephone visit today for COVID-19  HPI COVID-19 - home test yesterday Onset Monday briefly, Tuesday full blown Body aches, low grade temp, and some nausea No vomiting or diarrhea.  Denies shob, doe, chest pain, visual changes, sensory changes Mild headache  Using acetaminophen and mucinex, some relief  No vaccination, has not had covid previously  Patient Active Problem List   Diagnosis Date Noted   Recurrent UTI 05/14/2020   Vertigo 05/11/2020   Allergic fungal sinusitis 05/11/2020   Headache 05/11/2020   Allergic rhinitis 05/01/2019   Long term (current) use of anticoagulants 09/22/2018    Carotid artery stenosis 12/03/2016   Weakness 05/06/2016   Overweight (BMI 25.0-29.9) 05/06/2016   Chest pain with high risk for cardiac etiology 01/24/2016   Paroxysmal atrial fibrillation ([place]) 01/08/2016   History of colonic polyps 12/12/2015   Cryptogenic stroke ([place]) 09/13/2015   Insomnia 08/10/2015   Vitamin D deficiency 06/21/2015   B12 deficiency 09/25/2013   Cerebrovascular disease 06/13/2011   Essential hypertension 06/13/2011   Hyperlipidemia    Postsurgical hypothyroidism    Coronary artery disease of native artery of native heart with stable angina pectoris [facility])     Past Medical History:  Diagnosis Date   Adenomatous colon polyp 2006   Allergy    Arthritis    low back   Back pain 06/13/2011   CAD (coronary artery disease) 06/12/2011   Depression    GERD (gastroesophageal reflux disease)    Hiatal hernia    Hyperlipidemia    Hypertension    Hypothyroidism 06/12/2011   Leg fracture    Pain in joint, ankle and foot 11/02/2012   Pain of left heel 05/23/2012   Skin lesions, generalized 05/23/2012   Thyroid cancer ([place])    Ulcer    Vertigo 06/13/2011    Current Outpatient Medications  Medication Sig Dispense Refill   ALPRAZolam (XANAX) 0.5 MG tablet TAKE 1 TABLET BY MOUTH AT BEDTIME AS NEEDED FOR ANXIETY 30 tablet 1   atorvastatin (LIPITOR) 40 MG tablet TAKE 1 TABLET BY MOUTH EVERY DAY IN THE EVENING 30 tablet 0   cetirizine (ZYRTEC) 10 MG tablet TAKE 1 TABLET BY MOUTH EVERY DAY 30 tablet 0  ELIQUIS 5 MG TABS tablet TAKE 1 TABLET BY MOUTH TWICE A DAY 180 tablet 1   levothyroxine (SYNTHROID) 112 MCG tablet TAKE 1 TABLET BY MOUTH EVERY DAY 90 tablet 1   losartan-hydrochlorothiazide (HYZAAR) 50-12.5 MG tablet TAKE 1 TABLET BY MOUTH EVERY DAY 90 tablet 1   meclizine (ANTIVERT) 25 MG tablet Take 1 tablet (25 mg total) by mouth 3 (three) times daily as needed for dizziness. 30 tablet 0   metoprolol succinate (TOPROL-XL) 50 MG 24 hr tablet TAKE 1 TABLET BY MOUTH EVERY  DAY 90 tablet 0   tiZANidine (ZANAFLEX) 4 MG tablet Take 1 tablet (4 mg total) by mouth every 8 (eight) hours as needed for muscle spasms. 30 tablet 0   No current facility-administered medications for this visit.    Allergies  Allergen Reactions   Amoxicillin Other (See Comments)    Patient develops a very bad yeast infection. Did it involve swelling of the face/tongue/throat, SOB, or low BP? No Did it involve sudden or severe rash/hives, skin peeling, or any reaction on the inside of your mouth or nose? No Did you need to seek medical attention at a hospital or doctor's office? No When did it last happen?      many years If all above answers are "NO", may proceed with cephalosporin use.     Social History   Socioeconomic History   Marital status: Married    Spouse name: Not on file   Number of children: Not on file   Years of education: Not on file   Highest education level: Not on file  Occupational History   Not on file  Tobacco Use   Smoking status: Former    Packs/day: 0.25    Years: 5.00    Pack years: 1.25    Types: Cigarettes    Quit date: 11/15/1968    Years since quitting: 52.2   Smokeless tobacco: Never  Vaping Use   Vaping Use: Never used  Substance and Sexual Activity   Alcohol use: No    Alcohol/week: 0.0 standard drinks   Drug use: No   Sexual activity: Not Currently  Other Topics Concern   Not on file  Social History Narrative   Not on file   Social Determinants of Health   Financial Resource Strain: Not on file  Food Insecurity: Not on file  Transportation Needs: Not on file  Physical Activity: Not on file  Stress: Not on file  Social Connections: Not on file  Intimate Partner Violence: Not on file    ROS Per hpi, otherwise negative  Objective   Vitals as reported by the patient: There were no vitals filed for this visit.  Colleen Wright was seen today for covid positive.  Diagnoses and all orders for this visit:  COVID-19 -      molnupiravir EUA 200 mg CAPS; Take 4 capsules (800 mg total) by mouth 2 (two) times daily for 5 days.  PLAN We are nearing end of 5 day onset of symptoms - but will start molnupiravir after discussing r/b/se. ER precautions reviewed Ok to continue mucinex and acetaminophen Return if worsening or failing to improve Patient encouraged to call clinic with any questions, comments, or concerns.  I discussed the assessment and treatment plan with the patient. The patient was provided an opportunity to ask questions and all were answered. The patient agreed with the plan and demonstrated an understanding of the instructions.   The patient was advised to call back or seek an in-person  evaluation if the symptoms worsen or if the condition fails to improve as anticipated.  I provided 15 minutes of non-face-to-face time during this encounter.  [name], NP  Primary Care at [facility]

## 2021-02-12 ENCOUNTER — Ambulatory Visit: Payer: Medicare Other | Admitting: Cardiovascular Disease

## 2021-02-12 ENCOUNTER — Encounter: Payer: Self-pay | Admitting: *Deleted

## 2021-02-15 ENCOUNTER — Other Ambulatory Visit: Payer: Self-pay | Admitting: Family Medicine

## 2021-02-24 ENCOUNTER — Other Ambulatory Visit: Payer: Self-pay | Admitting: Internal Medicine

## 2021-02-24 DIAGNOSIS — J310 Chronic rhinitis: Secondary | ICD-10-CM

## 2021-02-28 ENCOUNTER — Other Ambulatory Visit: Payer: Self-pay | Admitting: Family Medicine

## 2021-03-13 ENCOUNTER — Telehealth: Payer: Self-pay | Admitting: Family Medicine

## 2021-03-13 NOTE — Telephone Encounter (Signed)
Declined AWV do not call  

## 2021-03-19 ENCOUNTER — Other Ambulatory Visit: Payer: Self-pay | Admitting: Family Medicine

## 2021-03-22 ENCOUNTER — Other Ambulatory Visit: Payer: Self-pay | Admitting: Family Medicine

## 2021-03-25 ENCOUNTER — Other Ambulatory Visit: Payer: Self-pay | Admitting: Internal Medicine

## 2021-03-25 DIAGNOSIS — J310 Chronic rhinitis: Secondary | ICD-10-CM

## 2021-04-17 ENCOUNTER — Other Ambulatory Visit: Payer: Self-pay | Admitting: Family Medicine

## 2021-04-27 ENCOUNTER — Telehealth: Payer: Self-pay

## 2021-04-27 NOTE — Telephone Encounter (Signed)
Left message on voicemail req call back to schedule appt with health advisor I am available Fri 9/16 2-5, 9/17 9-4, 9/18 10-3, 9/23 2-5  I did see a phone note from 2019 where pt declined AWV

## 2021-04-28 ENCOUNTER — Other Ambulatory Visit: Payer: Self-pay | Admitting: Internal Medicine

## 2021-04-28 DIAGNOSIS — J310 Chronic rhinitis: Secondary | ICD-10-CM

## 2021-04-29 ENCOUNTER — Other Ambulatory Visit: Payer: Self-pay | Admitting: Family Medicine

## 2021-05-02 ENCOUNTER — Telehealth: Payer: Self-pay

## 2021-05-02 ENCOUNTER — Other Ambulatory Visit: Payer: Self-pay

## 2021-05-02 DIAGNOSIS — B49 Unspecified mycosis: Secondary | ICD-10-CM

## 2021-05-02 DIAGNOSIS — J3089 Other allergic rhinitis: Secondary | ICD-10-CM

## 2021-05-02 MED ORDER — CETIRIZINE HCL 10 MG PO TABS: 10.0000 mg | ORAL_TABLET | Freq: Every day | ORAL | 3 refills | Status: DC

## 2021-05-02 NOTE — Telephone Encounter (Signed)
Rx has been filled and sent to patient pharmacy per your order.

## 2021-05-02 NOTE — Telephone Encounter (Signed)
Pt takes over the counter Cetirizine HCL '10mg'$  and its cheaper if there is a RX called in and [employer] will kick in and pay.  She can get 100 tablets for 6.49 through [employer] if she can get a RX for it    [facility] is the pharmacy   Pt call [phone]

## 2021-05-02 NOTE — Telephone Encounter (Signed)
Is it ok to send in a Rx for patient to be able to get this at a cheaper rate. It's the Cetirizine HCL.

## 2021-05-02 NOTE — Telephone Encounter (Signed)
Ok to send prescription for pt, Cetirizine '10mg'$  1 tab daily, #100, 3 refills

## 2021-05-03 ENCOUNTER — Other Ambulatory Visit: Payer: Self-pay | Admitting: Family Medicine

## 2021-05-04 NOTE — Progress Notes (Signed)
Cardiology Office Note:    Date:  05/05/2021   ID:  Colleen Wright, DOB September 04, 1940, MRN [id number]  PCP:  Midge Minium, MD  Cardiologist:  Sanda Klein, MD   Referring MD: Midge Minium, MD   Chief Complaint  Patient presents with   Follow-up    PAF    History of Present Illness:    Colleen Wright is a 80 y.o. female with a hx of paroxysmal atrial fibrillation on chronic anticoagulation, cryptogenic stroke, hypertension, CAD, carotid artery stenosis, and hyperlipidemia.  She suffered a stroke which led to implantable loop recorder which detected asymptomatic atrial fibrillation.  She is anticoagulated with Eliquis 5 mg twice daily.  Hypertension is controlled with 50 mg Toprol and losartan-HCTZ.  Hyperlipidemia is controlled with 40 mg atorvastatin.  Carotid artery duplex in December 2020 showed 50 to 69% stenosis in the left ICA.  Loop recorder has since reached end of service.  However, while functional the ILR showed relatively low A. fib burden.  Left heart catheterization in June 2017 showed moderate to severe RCA branch stenosis that was treated with medical therapy due to tortuosity of the proximal vessel.  She was last seen in clinic by Dr. Sallyanne Kuster on 01/24/2020 and was doing well. She was hospitalized 04/2020 with vertigo and falls. Thankfully she is surrounded by family living next door/nearby that can check on her.   She presents for annual follow up. Six months ago she was at a restaurant and couldn't speak for 10 min and then resolved spontaneously. She did not seek help or treatment for this. With further questioning, she reports only taking eliquis once a day due to cost. We discussed taking eliquis BID for stroke prevention. I will provide patient assistance. She also reports continued dizziness and occasional chest tightness. She is concerned about her carotid artery stenosis. She thinks her cholesterol medicine is causing her dizziness. I discussed possibly adding  zetia in an attempt to reduce her statin and she is not interested in this.    Past Medical History:  Diagnosis Date   Adenomatous colon polyp 2006   Allergy    Arthritis    low back   Back pain 06/13/2011   CAD (coronary artery disease) 06/12/2011   Depression    GERD (gastroesophageal reflux disease)    Hiatal hernia    Hyperlipidemia    Hypertension    Hypothyroidism 06/12/2011   Leg fracture    Pain in joint, ankle and foot 11/02/2012   Pain of left heel 05/23/2012   Skin lesions, generalized 05/23/2012   Thyroid cancer ([place])    Ulcer    Vertigo 06/13/2011    Past Surgical History:  Procedure Laterality Date   BALLOON ANGIOPLASTY, ARTERY     CARDIAC CATHETERIZATION N/A 01/24/2016   Procedure: Left Heart Cath and Coronary Angiography;  Surgeon: Peter M Martinique, MD;  Location: [facility];  Service: Cardiovascular;  Laterality: N/A;   EP IMPLANTABLE DEVICE N/A 09/24/2015   Procedure: Loop Recorder Insertion;  Surgeon: Sanda Klein, MD;  Location: [facility];  Service: Cardiovascular;  Laterality: N/A;   FRACTURE SURGERY     left leg, pin and 5 bolts in place in lower tibia   LESION REMOVAL     vaginal   THYROIDECTOMY, PARTIAL     twice   TUBAL LIGATION     growth removed from posterior vaginal vault    Current Medications: Current Meds  Medication Sig   ALPRAZolam Duanne Moron)  0.5 MG tablet TAKE 1 TABLET BY MOUTH AT BEDTIME AS NEEDED FOR ANXIETY   apixaban (ELIQUIS) 5 MG TABS tablet Take 1 tablet (5 mg total) by mouth 2 (two) times daily.   atorvastatin (LIPITOR) 40 MG tablet TAKE 1 TABLET BY MOUTH EVERY DAY IN THE EVENING   cetirizine (ZYRTEC) 10 MG tablet TAKE 1 TABLET BY MOUTH EVERY DAY   cetirizine (ZYRTEC) 10 MG tablet Take 1 tablet (10 mg total) by mouth daily.   ELIQUIS 5 MG TABS tablet TAKE 1 TABLET BY MOUTH TWICE A DAY   levothyroxine (SYNTHROID) 112 MCG tablet TAKE 1 TABLET BY MOUTH EVERY DAY   losartan-hydrochlorothiazide (HYZAAR) 50-12.5 MG  tablet TAKE 1 TABLET BY MOUTH EVERY DAY   meclizine (ANTIVERT) 25 MG tablet Take 1 tablet (25 mg total) by mouth 3 (three) times daily as needed for dizziness.   metoprolol succinate (TOPROL-XL) 50 MG 24 hr tablet TAKE 1 TABLET BY MOUTH EVERY DAY   tiZANidine (ZANAFLEX) 4 MG tablet Take 1 tablet (4 mg total) by mouth every 8 (eight) hours as needed for muscle spasms.     Allergies:   Amoxicillin   Social History   Socioeconomic History   Marital status: Married    Spouse name: Not on file   Number of children: Not on file   Years of education: Not on file   Highest education level: Not on file  Occupational History   Not on file  Tobacco Use   Smoking status: Former    Packs/day: 0.25    Years: 5.00    Pack years: 1.25    Types: Cigarettes    Quit date: 11/15/1968    Years since quitting: 52.5   Smokeless tobacco: Never  Vaping Use   Vaping Use: Never used  Substance and Sexual Activity   Alcohol use: No    Alcohol/week: 0.0 standard drinks   Drug use: No   Sexual activity: Not Currently  Other Topics Concern   Not on file  Social History Narrative   Not on file   Social Determinants of Health   Financial Resource Strain: Not on file  Food Insecurity: Not on file  Transportation Needs: Not on file  Physical Activity: Not on file  Stress: Not on file  Social Connections: Not on file     Family History: The patient's family history includes Cancer in her cousin, mother, and sister; Heart disease in her brother, father, and sister; Hypertension in her sister; Stroke in her mother and sister.  ROS:   Please see the history of present illness.     All other systems reviewed and are negative.  EKGs/Labs/Other Studies Reviewed:    The following studies were reviewed today:  Carotid dopplers 2020:  Right:   Color duplex indicates minimal heterogeneous plaque, with no hemodynamically significant stenosis by duplex criteria in the extracranial cerebrovascular  circulation.   Left:   Heterogeneous plaque at the left carotid bifurcation, with discordant results regarding degree of stenosis by established duplex criteria. Peak velocity suggests 50%-69% stenosis, with the ICA/ CCA ratio suggesting a lesser degree of stenosis. If establishing a more accurate degree of stenosis is required, cerebral angiogram should be considered, or as a second best test, CTA.   EKG:  EKG is ordered today.  The ekg ordered today demonstrates sinus rhythm HR 62  Recent Labs: 05/11/2020: ALT 26; BUN 21; Creatinine, Ser 0.98; Potassium 4.2; Sodium 135 06/13/2020: Hemoglobin 13.0; Platelets 177.0 07/17/2020: TSH 2.82  Recent Lipid Panel  Component Value Date/Time   CHOL 136 11/28/2019 1122   TRIG 108.0 11/28/2019 1122   HDL 40.60 11/28/2019 1122   CHOLHDL 3 11/28/2019 1122   VLDL 21.6 11/28/2019 1122   LDLCALC 74 11/28/2019 1122    Physical Exam:    VS:  BP 120/66 (BP Location: Right Arm)   Pulse 62   Ht '5\' 2"'$  (1.575 m)   Wt 159 lb 9.6 oz (72.4 kg)   SpO2 94%   BMI 29.19 kg/m     Wt Readings from Last 3 Encounters:  05/05/21 159 lb 9.6 oz (72.4 kg)  06/13/20 147 lb 12.8 oz (67 kg)  05/10/20 151 lb (68.5 kg)     GEN: Well nourished, well developed in no acute distress HEENT: Normal NECK: No JVD; No carotid bruits LYMPHATICS: No lymphadenopathy CARDIAC: RRR, no murmurs, rubs, gallops RESPIRATORY:  Clear to auscultation without rales, wheezing or rhonchi  ABDOMEN: Soft, non-tender, non-distended MUSCULOSKELETAL:  No edema; No deformity  SKIN: Warm and dry NEUROLOGIC:  Alert and oriented x 3 PSYCHIATRIC:  Normal affect   ASSESSMENT:    1. Paroxysmal atrial fibrillation (HCC)   2. Long term (current) use of anticoagulants   3. Cerebrovascular disease   4. TIA (transient ischemic attack)   5. Coronary artery disease of native artery of native heart with stable angina pectoris ([place])   6. Hyperlipidemia, unspecified hyperlipidemia type   7.  Bilateral carotid artery stenosis    PLAN:    In order of problems listed above:  PAF Chronic anticoagulation Hx of CVA Possible recent TIA This patients CHA2DS2-VASc Score and unadjusted Ischemic Stroke Rate (% per year) is equal to 11.2 % stroke rate/year from a score of 7 (1age, female, HTN, CAD, CVA2) - recently fell off a rock, but sounds mechanical - anticoagulated with eliquis - but was only taking once per day - correct dose with weight 72 kg, sCr 0.98  - recommended BID dosing - will provide samples and patient assistance    CAD - treated medically - no ASA in the setting of eliquis - continue BB and statin - chest pain sounds atypical - she does not want to make any medication changes   Hyperlipidemia - continue statin LDL 74 11/2019 - she does not want to consider zetia    Carotid artery stenosis - last duplex 07/2019 - repeat scan given dizziness and recent /TIA   Follow up with Dr. Sallyanne Kuster in 3 months.     Medication Adjustments/Labs and Tests Ordered: Current medicines are reviewed at length with the patient today.  Concerns regarding medicines are outlined above.  Orders Placed This Encounter  Procedures   EKG 12-Lead   VAS US CAROTID   Meds ordered this encounter  Medications   apixaban (ELIQUIS) 5 MG TABS tablet    Sig: Take 1 tablet (5 mg total) by mouth 2 (two) times daily.    Dispense:  28 tablet    Refill:  0    Signed, Ledora Bottcher, [place]  05/05/2021 5:44 PM    [facility]

## 2021-05-05 ENCOUNTER — Other Ambulatory Visit: Payer: Self-pay

## 2021-05-05 ENCOUNTER — Ambulatory Visit (INDEPENDENT_AMBULATORY_CARE_PROVIDER_SITE_OTHER): Payer: Medicare Other | Admitting: Physician Assistant

## 2021-05-05 ENCOUNTER — Encounter: Payer: Self-pay | Admitting: Physician Assistant

## 2021-05-05 VITALS — BP 120/66 | HR 62 | Ht 62.0 in | Wt 159.6 lb

## 2021-05-05 DIAGNOSIS — I25118 Atherosclerotic heart disease of native coronary artery with other forms of angina pectoris: Secondary | ICD-10-CM | POA: Diagnosis not present

## 2021-05-05 DIAGNOSIS — I679 Cerebrovascular disease, unspecified: Secondary | ICD-10-CM | POA: Diagnosis not present

## 2021-05-05 DIAGNOSIS — I480 Paroxysmal atrial fibrillation: Secondary | ICD-10-CM

## 2021-05-05 DIAGNOSIS — G459 Transient cerebral ischemic attack, unspecified: Secondary | ICD-10-CM

## 2021-05-05 DIAGNOSIS — E785 Hyperlipidemia, unspecified: Secondary | ICD-10-CM

## 2021-05-05 DIAGNOSIS — Z7901 Long term (current) use of anticoagulants: Secondary | ICD-10-CM

## 2021-05-05 DIAGNOSIS — I6523 Occlusion and stenosis of bilateral carotid arteries: Secondary | ICD-10-CM | POA: Diagnosis not present

## 2021-05-05 MED ORDER — APIXABAN 5 MG PO TABS: 5.0000 mg | ORAL_TABLET | Freq: Two times a day (BID) | ORAL | 0 refills | Status: DC

## 2021-05-05 NOTE — Patient Instructions (Signed)
Medication Instructions:  No Changes  *If you need a refill on your cardiac medications before your next appointment, please call your pharmacy*   Lab Work: No labs If you have labs (blood work) drawn today and your tests are completely normal, you will receive your results only by: [place] (if you have MyChart) OR A paper copy in the mail If you have any lab test that is abnormal or we need to change your treatment, we will call you to review the results.   Testing/Procedures: [place], [address] Your physician has requested that you have a carotid duplex. This test is an ultrasound of the carotid arteries in your neck. It looks at blood flow through these arteries that supply the brain with blood. Allow one hour for this exam. There are no restrictions or special instructions.    Follow-Up: At [facility], you and your health needs are our priority.  As part of our continuing mission to provide you with exceptional heart care, we have created designated Provider Care Teams.  These Care Teams include your primary Cardiologist (physician) and Advanced Practice Providers (APPs -  Physician Assistants and Nurse Practitioners) who all work together to provide you with the care you need, when you need it.  We recommend signing up for the patient portal called "MyChart".  Sign up information is provided on this After Visit Summary.  MyChart is used to connect with patients for Virtual Visits (Telemedicine).  Patients are able to view lab/test results, encounter notes, upcoming appointments, etc.  Non-urgent messages can be sent to your provider as well.   To learn more about what you can do with MyChart, go to [email or web address].    Your next appointment:   3 month(s)  The format for your next appointment:   In Person  Provider:   Sanda Klein, MD

## 2021-05-10 ENCOUNTER — Other Ambulatory Visit: Payer: Self-pay | Admitting: Family Medicine

## 2021-05-10 ENCOUNTER — Other Ambulatory Visit: Payer: Self-pay | Admitting: Cardiovascular Disease

## 2021-05-12 ENCOUNTER — Other Ambulatory Visit: Payer: Self-pay

## 2021-05-12 ENCOUNTER — Ambulatory Visit (HOSPITAL_COMMUNITY)
Admission: RE | Admit: 2021-05-12 | Discharge: 2021-05-12 | Disposition: A | Discharge status: Home / Self Care | Payer: Medicare Other | Source: Ambulatory Visit | Attending: Cardiovascular Disease | Admitting: Cardiovascular Disease

## 2021-05-12 ENCOUNTER — Telehealth: Payer: Self-pay

## 2021-05-12 DIAGNOSIS — I6523 Occlusion and stenosis of bilateral carotid arteries: Secondary | ICD-10-CM | POA: Diagnosis not present

## 2021-05-12 DIAGNOSIS — I480 Paroxysmal atrial fibrillation: Secondary | ICD-10-CM | POA: Insufficient documentation

## 2021-05-12 DIAGNOSIS — E785 Hyperlipidemia, unspecified: Secondary | ICD-10-CM | POA: Insufficient documentation

## 2021-05-12 DIAGNOSIS — G459 Transient cerebral ischemic attack, unspecified: Secondary | ICD-10-CM | POA: Insufficient documentation

## 2021-05-12 DIAGNOSIS — I25118 Atherosclerotic heart disease of native coronary artery with other forms of angina pectoris: Secondary | ICD-10-CM | POA: Diagnosis not present

## 2021-05-12 DIAGNOSIS — I679 Cerebrovascular disease, unspecified: Secondary | ICD-10-CM | POA: Insufficient documentation

## 2021-05-12 DIAGNOSIS — Z7901 Long term (current) use of anticoagulants: Secondary | ICD-10-CM | POA: Diagnosis not present

## 2021-05-12 DIAGNOSIS — M79645 Pain in left finger(s): Secondary | ICD-10-CM | POA: Diagnosis not present

## 2021-05-12 NOTE — Progress Notes (Signed)
Chronic Care Management Pharmacy Assistant   Name: ELENIE COVEN  MRN: [id number] DOB: [birth year]   Reason for Encounter: Disease State - General Adherence Call    Recent office visits:  02/07/21 [name], NP - Family Medicine (Video Visit) - COVID 19 - molnupiravir EUA 200 mg CAPS Take 4 capsules (800 mg total) by mouth 2 (two) times daily for 5 days prescribed.   06/13/20 [name], MD - Family Medicine - Alopecia - Labs were ordered. Follow up as needed or scheduled.   05/14/20 [name], MD - Family Medicine (Video Visit) - Vertigo - Ambulatory referral placed for Urology and ENT. Follow up not indicated.   Recent consult visits:  05/05/21 [name], PA - Cardiology - Paroxysmal Atrial Fibrillation - Vas US Carotid ordered. EKG performed. ELIQUIS 5 MG TABS tablet Take 1 tablet (5 mg total) by mouth 2 (two) times daily prescribed. Follow up in 3 months.    Hospital visits:  None in previous 6 months   Medications: Outpatient Encounter Medications as of 05/12/2021  Medication Sig   ALPRAZolam (XANAX) 0.5 MG tablet TAKE 1 TABLET BY MOUTH AT BEDTIME AS NEEDED FOR ANXIETY   apixaban (ELIQUIS) 5 MG TABS tablet Take 1 tablet (5 mg total) by mouth 2 (two) times daily.   atorvastatin (LIPITOR) 40 MG tablet TAKE 1 TABLET BY MOUTH EVERY DAY IN THE EVENING   cetirizine (ZYRTEC) 10 MG tablet TAKE 1 TABLET BY MOUTH EVERY DAY   cetirizine (ZYRTEC) 10 MG tablet Take 1 tablet (10 mg total) by mouth daily.   ELIQUIS 5 MG TABS tablet TAKE 1 TABLET BY MOUTH TWICE A DAY   levothyroxine (SYNTHROID) 112 MCG tablet TAKE 1 TABLET BY MOUTH EVERY DAY   losartan-hydrochlorothiazide (HYZAAR) 50-12.5 MG tablet TAKE 1 TABLET BY MOUTH EVERY DAY   meclizine (ANTIVERT) 25 MG tablet Take 1 tablet (25 mg total) by mouth 3 (three) times daily as needed for dizziness.   metoprolol succinate (TOPROL-XL) 50 MG 24 hr tablet TAKE 1 TABLET BY MOUTH EVERY DAY   tiZANidine (ZANAFLEX) 4 MG tablet  Take 1 tablet (4 mg total) by mouth every 8 (eight) hours as needed for muscle spasms.   No facility-administered encounter medications on file as of 05/12/2021.    Have you had any problems recently with your health? Patient reported she has been having some trouble with her back recently but has an appointment with Dr Birdie Riddle coming up.  Have you had any problems with your pharmacy? Patient reports it seems every month the pharmacy is saying they are needing to contact the doctors office for refills but otherwise no issues.   What issues or side effects are you having with your medications? Patient is wanting to have 90 day supply of her Atorvastatin sent to [facility] as she has been getting 30 day and this has made it challenging to get monthly because they pharmacy keeps saying they have to contact the office for more refills.   What would you like me to pass along to Madelin Rear, CPP for them to help you with?  Patient would like to have refills placed on medicines with 90 day supply moving forward to make it easier for her to obtain her medication.   What can we do to take care of you better? CPP was included in note about patient's request on her medication refills.    Care Gaps  AWV: done 04/27/21 Colonoscopy: last done 12/03/09 DM Eye Exam: N/A  DM Foot Exam: N/A Microalbumin: N/A HbgAIC: N/A DEXA: last done 06/19/05 Mammogram: done 07/30/17   Star Rating Drugs: Losartan-hydrochlorothiazide (HYZAAR) 50-12.5 MG tablet - last filled 01/25/21 90 days Atorvastatin (LIPITOR) 40 MG tablet - last filled 04/17/21 30 days    Future Appointments  Date Time Provider [place]  05/12/2021  1:00 PM [facility]  06/18/2021  1:00 PM Midge Minium, MD [facility]  08/04/2021  3:45 PM [facility], Tami Lin, PA [facility] [place], [place] Pharmacist Assistant  [phone]  Time Spent: 40 minutes

## 2021-05-12 NOTE — Telephone Encounter (Signed)
Prescription refill request for Eliquis received. Indication:AFIB Last office visit:[facility] 05/05/21 Scr:0.980 mg/ 05/11/2020 Age: 61F Weight:72.4KG

## 2021-05-14 ENCOUNTER — Encounter: Payer: Self-pay | Admitting: *Deleted

## 2021-05-16 ENCOUNTER — Other Ambulatory Visit: Payer: Self-pay

## 2021-05-16 ENCOUNTER — Encounter: Payer: Self-pay | Admitting: Family Medicine

## 2021-05-16 ENCOUNTER — Ambulatory Visit (INDEPENDENT_AMBULATORY_CARE_PROVIDER_SITE_OTHER): Payer: Medicare Other | Admitting: Family Medicine

## 2021-05-16 VITALS — BP 122/70 | HR 68 | Temp 97.5°F | Resp 16 | Ht 62.0 in | Wt 163.4 lb

## 2021-05-16 DIAGNOSIS — E785 Hyperlipidemia, unspecified: Secondary | ICD-10-CM | POA: Diagnosis not present

## 2021-05-16 DIAGNOSIS — M51369 Other intervertebral disc degeneration, lumbar region without mention of lumbar back pain or lower extremity pain: Secondary | ICD-10-CM | POA: Insufficient documentation

## 2021-05-16 DIAGNOSIS — I10 Essential (primary) hypertension: Secondary | ICD-10-CM

## 2021-05-16 DIAGNOSIS — M5136 Other intervertebral disc degeneration, lumbar region: Secondary | ICD-10-CM | POA: Diagnosis not present

## 2021-05-16 DIAGNOSIS — I6523 Occlusion and stenosis of bilateral carotid arteries: Secondary | ICD-10-CM

## 2021-05-16 DIAGNOSIS — E890 Postprocedural hypothyroidism: Secondary | ICD-10-CM

## 2021-05-16 LAB — CBC WITH DIFFERENTIAL/PLATELET
Basophils Absolute: 0 10*3/uL (ref 0.0–0.1)
Basophils Relative: 0.6 % (ref 0.0–3.0)
Eosinophils Absolute: 0.2 10*3/uL (ref 0.0–0.7)
Eosinophils Relative: 4 % (ref 0.0–5.0)
HCT: 37.2 % (ref 36.0–46.0)
Hemoglobin: 12.5 g/dL (ref 12.0–15.0)
Lymphocytes Relative: 30.6 % (ref 12.0–46.0)
Lymphs Abs: 1.6 10*3/uL (ref 0.7–4.0)
MCHC: 33.5 g/dL (ref 30.0–36.0)
MCV: 88.5 fl (ref 78.0–100.0)
Monocytes Absolute: 0.4 10*3/uL (ref 0.1–1.0)
Monocytes Relative: 8.1 % (ref 3.0–12.0)
Neutro Abs: 3.1 10*3/uL (ref 1.4–7.7)
Neutrophils Relative %: 56.7 % (ref 43.0–77.0)
Platelets: 188 10*3/uL (ref 150.0–400.0)
RBC: 4.21 Mil/uL (ref 3.87–5.11)
RDW: 13.6 % (ref 11.5–15.5)
WBC: 5.4 10*3/uL (ref 4.0–10.5)

## 2021-05-16 LAB — LIPID PANEL
Cholesterol: 138 mg/dL (ref 0–200)
HDL: 45 mg/dL (ref >39.00)
LDL Cholesterol: 68 mg/dL (ref 0–99)
NonHDL: 93.1
Total CHOL/HDL Ratio: 3
Triglycerides: 125 mg/dL (ref 0.0–149.0)
VLDL: 25 mg/dL (ref 0.0–40.0)

## 2021-05-16 LAB — BASIC METABOLIC PANEL
BUN: 14 mg/dL (ref 6–23)
CO2: 28 mEq/L (ref 19–32)
Calcium: 9.1 mg/dL (ref 8.4–10.5)
Chloride: 104 mEq/L (ref 96–112)
Creatinine, Ser: 1.13 mg/dL (ref 0.40–1.20)
GFR: 45.4 mL/min — ABNORMAL LOW (ref >60.00)
Glucose, Bld: 82 mg/dL (ref 70–99)
Potassium: 4 mEq/L (ref 3.5–5.1)
Sodium: 140 mEq/L (ref 135–145)

## 2021-05-16 LAB — HEPATIC FUNCTION PANEL
ALT: 17 U/L (ref 0–35)
AST: 25 U/L (ref 0–37)
Albumin: 3.9 g/dL (ref 3.5–5.2)
Alkaline Phosphatase: 76 U/L (ref 39–117)
Bilirubin, Direct: 0.2 mg/dL (ref 0.0–0.3)
Total Bilirubin: 0.8 mg/dL (ref 0.2–1.2)
Total Protein: 6.8 g/dL (ref 6.0–8.3)

## 2021-05-16 LAB — TSH: TSH: 9.27 u[IU]/mL — ABNORMAL HIGH (ref 0.35–5.50)

## 2021-05-16 NOTE — Assessment & Plan Note (Signed)
Chronic problem.  Currently asymptomatic on Levothyroxine 164mcg daily.  Check labs.  Adjust meds prn

## 2021-05-16 NOTE — Patient Instructions (Signed)
You can cancel your appt with me in November but schedule one in 6 months to recheck BP and cholesterol We'll notify you of your lab results and make any changes if needed We'll call you to schedule your MRI for the back pain Call with any questions or concerns Hang in there!!!

## 2021-05-16 NOTE — Assessment & Plan Note (Signed)
Chronic problem.  On Lipitor w/o difficulty.  Check labs.  Adjust meds prn

## 2021-05-16 NOTE — Assessment & Plan Note (Signed)
New to provider, ongoing for pt.  She had MRI done in 2016 which shows DDD w/ mild spinal stenosis.  She feels sxs have worsened since then.  Would like repeat imaging so she can move forward with treatment.  MRI ordered.

## 2021-05-16 NOTE — Assessment & Plan Note (Signed)
Chronic problem.  On Losartan HCTZ and Metoprolol but she has not been taking her Losartan for the last 2 weeks.  BP remains well controlled today and pt reports she feels better without the medication.  Will continue to hold and monitor BP going forward.  Pt expressed understanding and is in agreement w/ plan.

## 2021-05-16 NOTE — Progress Notes (Signed)
   Subjective:    Patient ID: Colleen Wright, female    DOB: [birth year], 80 y.o.   MRN: [id number]  HPI HTN- chronic problem, on Losartan HCTZ 50/12.5mg  daily, Metoprolol XL 50mg  daily.  Pt reports she has not taken Losartan HCTZ in 2 weeks and she actually feels better off the medication.  No CP, SOB, HAs, visual changes.  Hyperlipidemia- chronic problem, on Lipitor 40mg  daily.  No abd pain, N/V.  Hypothyroid- chronic problem, on Levothyroxine 129mcg.  No change in energy level  LBP- pt reports she has been complaining about her back 'for years'.  Had 3 injections in back at [facility] 4-5 months ago which were 'really helpful'.  They did not do any imaging at this time.  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  Pt feels back pain is worsening w/ time.  She reports she isn't able to do anything around the house w/o having to stop and sit.  No hx of back surgery.   Review of Systems For ROS see HPI   This visit occurred during the SARS-CoV-2 public health emergency.  Safety protocols were in place, including screening questions prior to the visit, additional usage of staff PPE, and extensive cleaning of exam room while observing appropriate contact time as indicated for disinfecting solutions.      Objective:   Physical Exam Vitals reviewed.  Constitutional:      General: She is not in acute distress.    Appearance: Normal appearance. She is well-developed. She is not ill-appearing.  HENT:     Head: Normocephalic and atraumatic.  Eyes:     Conjunctiva/sclera: Conjunctivae normal.     Pupils: Pupils are equal, round, and reactive to light.  Neck:     Thyroid: No thyromegaly.  Cardiovascular:     Rate and Rhythm: Normal rate and regular rhythm.     Pulses: Normal pulses.     Heart sounds: Normal heart sounds. No murmur heard. Pulmonary:     Effort:  Pulmonary effort is normal. No respiratory distress.     Breath sounds: Normal breath sounds.  Abdominal:     General: There is no distension.     Palpations: Abdomen is soft.     Tenderness: There is no abdominal tenderness.  Musculoskeletal:     Cervical back: Normal range of motion and neck supple.     Right lower leg: No edema.     Left lower leg: No edema.  Lymphadenopathy:     Cervical: No cervical adenopathy.  Skin:    General: Skin is warm and dry.  Neurological:     Mental Status: She is alert and oriented to person, place, and time. Mental status is at baseline.  Psychiatric:        Mood and Affect: Mood normal.        Behavior: Behavior normal.          Assessment & Plan:

## 2021-05-19 ENCOUNTER — Other Ambulatory Visit: Payer: Self-pay

## 2021-05-19 DIAGNOSIS — R7989 Other specified abnormal findings of blood chemistry: Secondary | ICD-10-CM

## 2021-05-19 DIAGNOSIS — E890 Postprocedural hypothyroidism: Secondary | ICD-10-CM

## 2021-05-21 ENCOUNTER — Other Ambulatory Visit: Payer: Self-pay | Admitting: Family Medicine

## 2021-05-23 ENCOUNTER — Other Ambulatory Visit: Payer: Self-pay | Admitting: Family Medicine

## 2021-05-29 ENCOUNTER — Other Ambulatory Visit: Payer: Self-pay

## 2021-05-29 ENCOUNTER — Other Ambulatory Visit: Payer: Self-pay | Admitting: Family Medicine

## 2021-05-29 DIAGNOSIS — I10 Essential (primary) hypertension: Secondary | ICD-10-CM

## 2021-05-29 NOTE — Telephone Encounter (Signed)
She can restart her Losartan and she can take either a half pill or a whole pill.  Her BP was excellent at last visit so we don't need to have her come in unless she has any issues

## 2021-05-29 NOTE — Telephone Encounter (Signed)
Please advise if patient can be restarted on medication. Do we need a nurse visit at some point to recheck blood pressure?

## 2021-05-29 NOTE — Telephone Encounter (Signed)
I called and spoke to Colleen Wright to give her Dr. Virgil Benedict message. She states she will start with half a tablet and increase if headache persists. I advised her to call us back if there were no signs of improvement in headache after restarting medication. Colleen Wright states she has run out of medication and would like new prescription sent in. It is attached to this message.  Colleen Wright also stated she was trying to call [facility] to get an xray of her head since it had been hurting a lot and asked if she could just head there as a walk in. I recommended that Colleen Wright restart medications to check for improvement and let her know that any imaging required providers authorization. Colleen Wright verbalized understanding and agreement.

## 2021-05-29 NOTE — Telephone Encounter (Signed)
Caller name:Laurin Coder   On DPR? :Yes  Call back [phone]  Provider they see: Birdie Riddle  Reason for call:Pt recently told Dr Birdie Riddle that she wants to come off losartan 50mg  and she is getting headaches since she come off it. She gets dizzy from time to time. Pt is wanting to know if she can be put back on it she has not been off of it a week or two

## 2021-05-30 DIAGNOSIS — M720 Palmar fascial fibromatosis [Dupuytren]: Secondary | ICD-10-CM | POA: Diagnosis not present

## 2021-05-30 MED ORDER — LOSARTAN POTASSIUM-HCTZ 50-12.5 MG PO TABS: 1.0000 | ORAL_TABLET | Freq: Every day | ORAL | 1 refills | Status: DC

## 2021-05-31 ENCOUNTER — Encounter (HOSPITAL_BASED_OUTPATIENT_CLINIC_OR_DEPARTMENT_OTHER): Payer: Self-pay | Admitting: Emergency Medicine

## 2021-05-31 ENCOUNTER — Other Ambulatory Visit: Payer: Self-pay

## 2021-05-31 ENCOUNTER — Emergency Department (HOSPITAL_BASED_OUTPATIENT_CLINIC_OR_DEPARTMENT_OTHER)
Admission: EM | Admit: 2021-05-31 | Discharge: 2021-05-31 | Disposition: A | Discharge status: Home / Self Care | Payer: Medicare Other | Attending: Emergency Medicine | Admitting: Emergency Medicine

## 2021-05-31 ENCOUNTER — Emergency Department (HOSPITAL_BASED_OUTPATIENT_CLINIC_OR_DEPARTMENT_OTHER): Payer: Medicare Other

## 2021-05-31 DIAGNOSIS — Z79899 Other long term (current) drug therapy: Secondary | ICD-10-CM | POA: Insufficient documentation

## 2021-05-31 DIAGNOSIS — Z87891 Personal history of nicotine dependence: Secondary | ICD-10-CM | POA: Diagnosis not present

## 2021-05-31 DIAGNOSIS — Z7902 Long term (current) use of antithrombotics/antiplatelets: Secondary | ICD-10-CM | POA: Diagnosis not present

## 2021-05-31 DIAGNOSIS — R001 Bradycardia, unspecified: Secondary | ICD-10-CM | POA: Insufficient documentation

## 2021-05-31 DIAGNOSIS — R519 Headache, unspecified: Secondary | ICD-10-CM | POA: Diagnosis not present

## 2021-05-31 DIAGNOSIS — E890 Postprocedural hypothyroidism: Secondary | ICD-10-CM | POA: Insufficient documentation

## 2021-05-31 DIAGNOSIS — I10 Essential (primary) hypertension: Secondary | ICD-10-CM | POA: Diagnosis not present

## 2021-05-31 DIAGNOSIS — I2510 Atherosclerotic heart disease of native coronary artery without angina pectoris: Secondary | ICD-10-CM | POA: Insufficient documentation

## 2021-05-31 DIAGNOSIS — Z7901 Long term (current) use of anticoagulants: Secondary | ICD-10-CM | POA: Insufficient documentation

## 2021-05-31 DIAGNOSIS — Z85850 Personal history of malignant neoplasm of thyroid: Secondary | ICD-10-CM | POA: Diagnosis not present

## 2021-05-31 DIAGNOSIS — Z20822 Contact with and (suspected) exposure to covid-19: Secondary | ICD-10-CM | POA: Insufficient documentation

## 2021-05-31 DIAGNOSIS — J329 Chronic sinusitis, unspecified: Secondary | ICD-10-CM | POA: Diagnosis not present

## 2021-05-31 LAB — COMPREHENSIVE METABOLIC PANEL
ALT: 19 U/L (ref 0–44)
AST: 29 U/L (ref 15–41)
Albumin: 4 g/dL (ref 3.5–5.0)
Alkaline Phosphatase: 66 U/L (ref 38–126)
Anion gap: 11 (ref 5–15)
BUN: 21 mg/dL (ref 8–23)
CO2: 25 mmol/L (ref 22–32)
Calcium: 9.6 mg/dL (ref 8.9–10.3)
Chloride: 101 mmol/L (ref 98–111)
Creatinine, Ser: 1.03 mg/dL — ABNORMAL HIGH (ref 0.44–1.00)
GFR, Estimated: 54 mL/min — ABNORMAL LOW (ref >60)
Glucose, Bld: 98 mg/dL (ref 70–99)
Potassium: 4 mmol/L (ref 3.5–5.1)
Sodium: 137 mmol/L (ref 135–145)
Total Bilirubin: 0.7 mg/dL (ref 0.3–1.2)
Total Protein: 7.1 g/dL (ref 6.5–8.1)

## 2021-05-31 LAB — RAPID URINE DRUG SCREEN, HOSP PERFORMED
Amphetamines: NOT DETECTED
Barbiturates: NOT DETECTED
Benzodiazepines: NOT DETECTED
Cocaine: NOT DETECTED
Opiates: NOT DETECTED
Tetrahydrocannabinol: NOT DETECTED

## 2021-05-31 LAB — CBC
HCT: 36.8 % (ref 36.0–46.0)
Hemoglobin: 12.4 g/dL (ref 12.0–15.0)
MCH: 29.6 pg (ref 26.0–34.0)
MCHC: 33.7 g/dL (ref 30.0–36.0)
MCV: 87.8 fL (ref 80.0–100.0)
Platelets: 176 10*3/uL (ref 150–400)
RBC: 4.19 MIL/uL (ref 3.87–5.11)
RDW: 13.3 % (ref 11.5–15.5)
WBC: 7.8 10*3/uL (ref 4.0–10.5)
nRBC: 0 % (ref 0.0–0.2)

## 2021-05-31 LAB — DIFFERENTIAL
Abs Immature Granulocytes: 0.01 10*3/uL (ref 0.00–0.07)
Basophils Absolute: 0 10*3/uL (ref 0.0–0.1)
Basophils Relative: 1 %
Eosinophils Absolute: 0.2 10*3/uL (ref 0.0–0.5)
Eosinophils Relative: 2 %
Immature Granulocytes: 0 %
Lymphocytes Relative: 27 %
Lymphs Abs: 2.1 10*3/uL (ref 0.7–4.0)
Monocytes Absolute: 0.6 10*3/uL (ref 0.1–1.0)
Monocytes Relative: 8 %
Neutro Abs: 4.9 10*3/uL (ref 1.7–7.7)
Neutrophils Relative %: 62 %

## 2021-05-31 LAB — APTT: aPTT: 29 seconds (ref 24–36)

## 2021-05-31 LAB — PROTIME-INR
INR: 1.1 (ref 0.8–1.2)
Prothrombin Time: 14.1 seconds (ref 11.4–15.2)

## 2021-05-31 LAB — RESP PANEL BY RT-PCR (FLU A&B, COVID) ARPGX2
Influenza A by PCR: NEGATIVE
Influenza B by PCR: NEGATIVE
SARS Coronavirus 2 by RT PCR: NEGATIVE

## 2021-05-31 LAB — ETHANOL: Alcohol, Ethyl (B): 13 mg/dL — ABNORMAL HIGH (ref <10)

## 2021-05-31 MED ORDER — SODIUM CHLORIDE 0.9 % IV BOLUS
500.0000 mL | Freq: Once | INTRAVENOUS | Status: AC
  Administered 2021-05-31: 500 mL via INTRAVENOUS

## 2021-05-31 MED ORDER — AMLODIPINE BESYLATE 5 MG PO TABS
5.0000 mg | ORAL_TABLET | Freq: Once | ORAL | Status: AC
  Administered 2021-05-31: 5 mg via ORAL
  Filled 2021-05-31: qty 1

## 2021-05-31 MED ORDER — KETOROLAC TROMETHAMINE 30 MG/ML IJ SOLN
15.0000 mg | Freq: Once | INTRAMUSCULAR | Status: AC
  Administered 2021-05-31: 15 mg via INTRAVENOUS
  Filled 2021-05-31: qty 1

## 2021-05-31 MED ORDER — SODIUM CHLORIDE 0.9 % IV SOLN: 100.0000 mL/h | INTRAVENOUS | Status: DC

## 2021-05-31 MED ORDER — PROCHLORPERAZINE EDISYLATE 10 MG/2ML IJ SOLN
10.0000 mg | Freq: Once | INTRAMUSCULAR | Status: AC
  Administered 2021-05-31: 10 mg via INTRAVENOUS
  Filled 2021-05-31: qty 2

## 2021-05-31 NOTE — ED Triage Notes (Signed)
Pt arrives pov with c/o frontal HA x 2 weeks, worsens at night. Reports R hand numbness x 3 nights pta, that has resolved. Endorses taking tylenol 500mg  and meclizine at 1300.

## 2021-05-31 NOTE — ED Notes (Signed)
Bilaterally equal, pt ambulatory to triage

## 2021-05-31 NOTE — ED Provider Notes (Signed)
[facility] EMERGENC[facility] DEPT Provider Note   CSN: [id number] Arrival date & time: 05/31/21  1453     History Chief Complaint  Patient presents with   Headache    Colleen Wright is a 80 y.o. female.   Headache  Patient presents to the ED for evaluation of a headache.  Patient states she has had frontal headache for the last couple of weeks.  Seems to get worse at night.  Patient also has noticed some numbness in her right hand few nights ago but it resolved.  Patient states she has been taking Tylenol.  She also took some meclizine.  That did not really help much.  She continues to have headache.  She also feels like her ability to concentrate and think is not normal.  She is not having any trouble with her speech.  She is able to walk without difficulty.  She has not noticed any vision changes.  Patient has not seen anyone for the symptoms since it started but decided to come to the emergency room today because it was worse the last few days and she was concerned.  Past Medical History:  Diagnosis Date   Adenomatous colon polyp 2006   Allergy    Arthritis    low back   Back pain 06/13/2011   CAD (coronary artery disease) 06/12/2011   Depression    GERD (gastroesophageal reflux disease)    Hiatal hernia    Hyperlipidemia    Hypertension    Hypothyroidism 06/12/2011   Leg fracture    Pain in joint, ankle and foot 11/02/2012   Pain of left heel 05/23/2012   Skin lesions, generalized 05/23/2012   Thyroid cancer ([place])    Ulcer    Vertigo 06/13/2011    Patient Active Problem List   Diagnosis Date Noted   Lumbar degenerative disc disease 05/16/2021   Recurrent UTI 05/14/2020   Vertigo 05/11/2020   Allergic fungal sinusitis 05/11/2020   Headache 05/11/2020   Allergic rhinitis 05/01/2019   Long term (current) use of anticoagulants 09/22/2018   Carotid artery stenosis 12/03/2016   Weakness 05/06/2016   Overweight (BMI 25.0-29.9) 05/06/2016   Chest pain with high  risk for cardiac etiology 01/24/2016   Paroxysmal atrial fibrillation ([place]) 01/08/2016   History of colonic polyps 12/12/2015   Cryptogenic stroke ([place]) 09/13/2015   Insomnia 08/10/2015   Vitamin D deficiency 06/21/2015   B12 deficiency 09/25/2013   Cerebrovascular disease 06/13/2011   Essential hypertension 06/13/2011   Hyperlipidemia    Postsurgical hypothyroidism    Coronary artery disease of native artery of native heart with stable angina pectoris [facility])     Past Surgical History:  Procedure Laterality Date   BALLOON ANGIOPLASTY, ARTERY     CARDIAC CATHETERIZATION N/A 01/24/2016   Procedure: Left Heart Cath and Coronary Angiography;  Surgeon: Peter M Martinique, MD;  Location: [facility];  Service: Cardiovascular;  Laterality: N/A;   EP IMPLANTABLE DEVICE N/A 09/24/2015   Procedure: Loop Recorder Insertion;  Surgeon: Sanda Klein, MD;  Location: [facility];  Service: Cardiovascular;  Laterality: N/A;   FRACTURE SURGERY     left leg, pin and 5 bolts in place in lower tibia   LESION REMOVAL     vaginal   THYROIDECTOMY, PARTIAL     twice   TUBAL LIGATION     growth removed from posterior vaginal vault     OB History   No obstetric history on file.     Family  History  Problem Relation Age of Onset   Cancer Mother        ovary/uterus-can't remember   Stroke Mother    Heart disease Father    Heart disease Sister        bypass   Stroke Sister    Cancer Sister        LN, bone   Hypertension Sister    Heart disease Brother        bypass   Cancer Cousin        breast    Social History   Tobacco Use   Smoking status: Former    Packs/day: 0.25    Years: 5.00    Pack years: 1.25    Types: Cigarettes    Quit date: 11/15/1968    Years since quitting: 52.5   Smokeless tobacco: Never  Vaping Use   Vaping Use: Never used  Substance Use Topics   Alcohol use: No    Alcohol/week: 0.0 standard drinks   Drug use: No    Home Medications Prior to Admission  medications   Medication Sig Start Date End Date Taking? Authorizing Provider  ALPRAZolam Duanne Moron) 0.5 MG tablet TAKE 1 TABLET BY MOUTH AT BEDTIME AS NEEDED FOR ANXIETY 10/30/20   Midge Minium, MD  apixaban (ELIQUIS) 5 MG TABS tablet Take 1 tablet (5 mg total) by mouth 2 (two) times daily. 05/05/21   [facility], Tami Lin, PA  atorvastatin (LIPITOR) 40 MG tablet TAKE 1 TABLET BY MOUTH EVERY DAY IN THE EVENING 05/12/21   Midge Minium, MD  cetirizine (ZYRTEC) 10 MG tablet TAKE 1 TABLET BY MOUTH EVERY DAY 02/24/21   Spero Geralds, MD  cetirizine (ZYRTEC) 10 MG tablet Take 1 tablet (10 mg total) by mouth daily. 05/02/21   Midge Minium, MD  ELIQUIS 5 MG TABS tablet TAKE 1 TABLET BY MOUTH TWICE A DAY 05/12/21   Ledora Bottcher, PA  levothyroxine (SYNTHROID) 112 MCG tablet TAKE 1 TABLET BY MOUTH EVERY DAY 05/12/21   Midge Minium, MD  losartan-hydrochlorothiazide (HYZAAR) 50-12.5 MG tablet Take 1 tablet by mouth daily. 05/30/21   Midge Minium, MD  meclizine (ANTIVERT) 25 MG tablet Take 1 tablet (25 mg total) by mouth 3 (three) times daily as needed for dizziness. 05/11/20   Dessa Phi, DO  metoprolol succinate (TOPROL-XL) 50 MG 24 hr tablet TAKE 1 TABLET BY MOUTH EVERY DAY 05/12/21   [facility], Tami Lin, PA  tiZANidine (ZANAFLEX) 4 MG tablet Take 1 tablet (4 mg total) by mouth every 8 (eight) hours as needed for muscle spasms. 12/18/19   Midge Minium, MD    Allergies    Amoxicillin  Review of Systems   Review of Systems  Neurological:  Positive for headaches.  All other systems reviewed and are negative.  Physical Exam Updated Vital Signs BP (!) 197/90 (BP Location: Right Arm)   Pulse (!) 56   Temp 98.1 F (36.7 C) (Oral)   Resp 16   Ht 1.575 m (5\' 2" )   Wt 72.6 kg   SpO2 100%   BMI 29.26 kg/m   Physical Exam Vitals and nursing note reviewed.  Constitutional:      General: She is not in acute distress.    Appearance: She is well-developed.  HENT:      Head: Normocephalic and atraumatic.     Right Ear: External ear normal.     Left Ear: External ear normal.  Eyes:     General:  No scleral icterus.       Right eye: No discharge.        Left eye: No discharge.     Conjunctiva/sclera: Conjunctivae normal.  Neck:     Trachea: No tracheal deviation.  Cardiovascular:     Rate and Rhythm: Normal rate and regular rhythm.  Pulmonary:     Effort: Pulmonary effort is normal. No respiratory distress.     Breath sounds: Normal breath sounds. No stridor. No wheezing or rales.  Abdominal:     General: Bowel sounds are normal. There is no distension.     Palpations: Abdomen is soft.     Tenderness: There is no abdominal tenderness. There is no guarding or rebound.  Musculoskeletal:        General: No tenderness.     Cervical back: Neck supple.  Skin:    General: Skin is warm and dry.     Findings: No rash.  Neurological:     Mental Status: She is alert and oriented to person, place, and time.     Cranial Nerves: No cranial nerve deficit (No facial droop, extraocular movements intact, tongue midline ).     Sensory: No sensory deficit.     Motor: No abnormal muscle tone or seizure activity.     Coordination: Coordination normal.     Comments: No pronator drift bilateral upper extrem, able to hold both legs off bed for 5 seconds, sensation intact in all extremities, no visual field cuts, no left or right sided neglect, normal finger-nose exam bilaterally although some difficulty understanding the task itself, did require several repeat instructions, no nystagmus noted, no difficulty naming objects such as a clock and watch     ED Results / Procedures / Treatments   Labs (all labs ordered are listed, but only abnormal results are displayed) Labs Reviewed  RESP PANEL BY RT-PCR (FLU A&B, COVID) ARPGX2  ETHANOL  PROTIME-INR  APTT  CBC  DIFFERENTIAL  COMPREHENSIVE METABOLIC PANEL  RAPID URINE DRUG SCREEN, HOSP PERFORMED    EKG EKG  Interpretation  Date/Time:  Saturday May 31 2021 15:11:39 EDT Ventricular Rate:  57 PR Interval:  120 QRS Duration: 84 QT Interval:  438 QTC Calculation: 426 R Axis:   63 Text Interpretation: Sinus bradycardia Possible Anterior infarct , age undetermined Abnormal ECG Since last tracing rate slower Confirmed by Dorie Rank [phone]) on 05/31/2021 3:25:50 PM  Radiology No results found.  Procedures Procedures   Medications Ordered in ED Medications  sodium chloride 0.9 % bolus 500 mL (has no administration in time range)    Followed by  0.9 %  sodium chloride infusion (has no administration in time range)  prochlorperazine (COMPAZINE) injection 10 mg (has no administration in time range)    ED Course  I have reviewed the triage vital signs and the nursing notes.  Pertinent labs & imaging results that were available during my care of the patient were reviewed by me and considered in my medical decision making (see chart for details).  Clinical Course as of 06/02/21 1000  Sat May 31, 2021  8115 CBC and metabolic panel unremarkable [JK]  1739 No acute findings noted on head CT.  Findings associated with chronic fungal sinus infection noted.  Apparently improved from previous imaging tests [JK]    Clinical Course User Index [JK] Dorie Rank, MD   MDM Rules/Calculators/A&P  Patient presented to the ER for evaluation of a headache.  Patient has been having symptoms for the last couple of weeks.  On exam she has no focal neurologic deficits.  No signs of ataxia.  No facial droop.  No focal numbness or weakness.  Was concerned about the possibility of of stroke or hemorrhage considering her age and her complaints.  Head CT fortunately does not show any acute abnormalities.  MRI is not available at this institution but overall I have low suspicion for occult stroke and do not feel that she needs emergent MRI.  Patient does have evidence of chronic sinusitis.   This was noted previously.  She is not sure who she has followed with this.  I do see notes in the chart regarding her primary care doctor.  Unclear if she has seen ENT.  I will provide a referral to ENT.  Patient noted to be hypertensive.  We will give an additional dose of blood pressure medications.  We will give a dose of pain medication for headache.  Anticipate discharge home with medications and close outpatient follow-up Final Clinical Impression(s) / ED Diagnoses Final diagnoses:  Nonintractable headache, unspecified chronicity pattern, unspecified headache type  Chronic sinusitis, unspecified location  Hypertension, unspecified type    Rx / DC Orders ED Discharge Orders     None        Dorie Rank, MD 06/02/21 1001

## 2021-05-31 NOTE — Discharge Instructions (Signed)
Take the medication as needed for your headache.  Follow-up with your doctor to recheck on your blood pressure and your headache.  Follow-up with an ENT doctor regarding your sinus issues

## 2021-05-31 NOTE — ED Notes (Signed)
She tells me she is unable to attempt swallow study d/t discomfort.

## 2021-06-03 ENCOUNTER — Telehealth: Payer: Self-pay | Admitting: Family Medicine

## 2021-06-03 NOTE — Telephone Encounter (Signed)
..  Caller name:  Aishwarya Shiplett - grand daughter  On DPR? :yes/no: Yes  Call back number:[phone]  Provider they see: Birdie Riddle    Reason for call: Patient was seen at [facility] over the weekend and diagnosed with sinusitis - grand daughter states that patient was not give an antibiotic and is still experiencing a headache and would like to know if an antibiotic could be called in for her.  Please advise.

## 2021-06-03 NOTE — Telephone Encounter (Signed)
Called and spoke to granddaughter and she stated that patient was not given any meds and she is still having headaches and b/p is still elevated higher than normal. Granddaughter stated that it was runing in the 200s at the ER and now it is in the 160s. I offered to schedule patient with another provider and it was declined. Patient rather wait until you return to the office.

## 2021-06-04 ENCOUNTER — Encounter: Payer: Medicare Other | Admitting: Family Medicine

## 2021-06-04 ENCOUNTER — Other Ambulatory Visit: Payer: Self-pay

## 2021-06-04 ENCOUNTER — Ambulatory Visit
Admission: RE | Admit: 2021-06-04 | Discharge: 2021-06-04 | Disposition: A | Discharge status: Home / Self Care | Payer: Medicare Other | Source: Ambulatory Visit | Attending: Family Medicine | Admitting: Family Medicine

## 2021-06-04 DIAGNOSIS — M5136 Other intervertebral disc degeneration, lumbar region: Secondary | ICD-10-CM

## 2021-06-04 DIAGNOSIS — M48061 Spinal stenosis, lumbar region without neurogenic claudication: Secondary | ICD-10-CM | POA: Diagnosis not present

## 2021-06-04 DIAGNOSIS — M545 Low back pain, unspecified: Secondary | ICD-10-CM | POA: Diagnosis not present

## 2021-06-04 MED ORDER — GADOBENATE DIMEGLUMINE 529 MG/ML IV SOLN
11.0000 mL | Freq: Once | INTRAVENOUS | Status: AC | PRN
  Administered 2021-06-04: 11 mL via INTRAVENOUS

## 2021-06-04 NOTE — Progress Notes (Signed)
This encounter was created in error - please disregard.

## 2021-06-04 NOTE — Telephone Encounter (Signed)
Noted  

## 2021-06-04 NOTE — Telephone Encounter (Signed)
Pt is on the schedule this afternoon.  Will see her at that time

## 2021-06-06 ENCOUNTER — Telehealth (INDEPENDENT_AMBULATORY_CARE_PROVIDER_SITE_OTHER): Payer: Medicare Other | Admitting: Registered Nurse

## 2021-06-06 ENCOUNTER — Other Ambulatory Visit: Payer: Self-pay | Admitting: Family Medicine

## 2021-06-06 ENCOUNTER — Other Ambulatory Visit: Payer: Self-pay

## 2021-06-06 ENCOUNTER — Encounter: Payer: Self-pay | Admitting: Registered Nurse

## 2021-06-06 DIAGNOSIS — J019 Acute sinusitis, unspecified: Secondary | ICD-10-CM

## 2021-06-06 DIAGNOSIS — B9689 Other specified bacterial agents as the cause of diseases classified elsewhere: Secondary | ICD-10-CM

## 2021-06-06 DIAGNOSIS — M51369 Other intervertebral disc degeneration, lumbar region without mention of lumbar back pain or lower extremity pain: Secondary | ICD-10-CM

## 2021-06-06 DIAGNOSIS — M5136 Other intervertebral disc degeneration, lumbar region: Secondary | ICD-10-CM

## 2021-06-06 MED ORDER — AZITHROMYCIN 250 MG PO TABS: ORAL_TABLET | ORAL | 0 refills | Status: AC

## 2021-06-06 MED ORDER — AZELASTINE HCL 0.1 % NA SOLN: 1.0000 | Freq: Two times a day (BID) | NASAL | 12 refills | Status: DC

## 2021-06-06 NOTE — Progress Notes (Signed)
Telemedicine Encounter- SOAP NOTE Established Patient  This video encounter was conducted with the patient's (or proxy's) verbal consent via audio telecommunications: yes/no: Yes Patient was instructed to have this encounter in a suitably private space; and to only have persons present to whom they give permission to participate. In addition, patient identity was confirmed by use of name plus two identifiers (DOB and address).  I discussed the limitations, risks, security and privacy concerns of performing an evaluation and management service by telephone and the availability of in person appointments. I also discussed with the patient that there may be a patient responsible charge related to this service. The patient expressed understanding and agreed to proceed.  I spent a total of 14 minutes talking with the patient or their proxy.  Patient at home Provider in office  Participants: Kathrin Ruddy, NP and [name] with her daughter  Chief Complaint  Patient presents with   Follow-up    Patient states she went to the emergency for Htn , weak, face and mouth going numb, headaches . Patient states she was released that same day with no medication after being dx with sinus infection. Pt is still having the same symptoms     Subjective   NEHA WAIGHT is a 80 y.o. established patient.  Video visit today for follow up   HPI Headache last week - went to ER Worked up there - largely negative.  CT of head showed sinusitis , no other acute changes  Notes ongoing pressure type pain in ethmoid sinus and towards R eye No acute visual changes or pain in orbit  Denies fevers, chills, fatigue, shob, doe, chest pain, nvd  Patient Active Problem List   Diagnosis Date Noted   Lumbar degenerative disc disease 05/16/2021   Recurrent UTI 05/14/2020   Vertigo 05/11/2020   Allergic fungal sinusitis 05/11/2020   Headache 05/11/2020   Allergic rhinitis 05/01/2019   Long term (current) use of  anticoagulants 09/22/2018   Carotid artery stenosis 12/03/2016   Weakness 05/06/2016   Overweight (BMI 25.0-29.9) 05/06/2016   Chest pain with high risk for cardiac etiology 01/24/2016   Paroxysmal atrial fibrillation ([place]) 01/08/2016   History of colonic polyps 12/12/2015   Cryptogenic stroke ([place]) 09/13/2015   Insomnia 08/10/2015   Vitamin D deficiency 06/21/2015   B12 deficiency 09/25/2013   Cerebrovascular disease 06/13/2011   Essential hypertension 06/13/2011   Hyperlipidemia    Postsurgical hypothyroidism    Coronary artery disease of native artery of native heart with stable angina pectoris [facility])     Past Medical History:  Diagnosis Date   Adenomatous colon polyp 2006   Allergy    Arthritis    low back   Back pain 06/13/2011   CAD (coronary artery disease) 06/12/2011   Depression    GERD (gastroesophageal reflux disease)    Hiatal hernia    Hyperlipidemia    Hypertension    Hypothyroidism 06/12/2011   Leg fracture    Pain in joint, ankle and foot 11/02/2012   Pain of left heel 05/23/2012   Skin lesions, generalized 05/23/2012   Thyroid cancer ([place])    Ulcer    Vertigo 06/13/2011    Current Outpatient Medications  Medication Sig Dispense Refill   ALPRAZolam (XANAX) 0.5 MG tablet TAKE 1 TABLET BY MOUTH AT BEDTIME AS NEEDED FOR ANXIETY 30 tablet 1   apixaban (ELIQUIS) 5 MG TABS tablet Take 1 tablet (5 mg total) by mouth 2 (two) times daily. 28 tablet  0   atorvastatin (LIPITOR) 40 MG tablet TAKE 1 TABLET BY MOUTH EVERY DAY IN THE EVENING 30 tablet 0   azelastine (ASTELIN) 0.1 % nasal spray Place 1 spray into both nostrils 2 (two) times daily. Use in each nostril as directed 30 mL 12   azithromycin (ZITHROMAX) 250 MG tablet Take 2 tablets on day 1, then 1 tablet daily on days 2 through 5 6 tablet 0   cetirizine (ZYRTEC) 10 MG tablet TAKE 1 TABLET BY MOUTH EVERY DAY 30 tablet 0   cetirizine (ZYRTEC) 10 MG tablet Take 1 tablet (10 mg total) by mouth daily. 100 tablet 3    ELIQUIS 5 MG TABS tablet TAKE 1 TABLET BY MOUTH TWICE A DAY 180 tablet 1   levothyroxine (SYNTHROID) 112 MCG tablet TAKE 1 TABLET BY MOUTH EVERY DAY 90 tablet 1   losartan-hydrochlorothiazide (HYZAAR) 50-12.5 MG tablet Take 1 tablet by mouth daily. 90 tablet 1   meclizine (ANTIVERT) 25 MG tablet Take 1 tablet (25 mg total) by mouth 3 (three) times daily as needed for dizziness. 30 tablet 0   metoprolol succinate (TOPROL-XL) 50 MG 24 hr tablet TAKE 1 TABLET BY MOUTH EVERY DAY 90 tablet 0   tiZANidine (ZANAFLEX) 4 MG tablet Take 1 tablet (4 mg total) by mouth every 8 (eight) hours as needed for muscle spasms. 30 tablet 0   No current facility-administered medications for this visit.    Allergies  Allergen Reactions   Amoxicillin Other (See Comments)    Patient develops a very bad yeast infection. Did it involve swelling of the face/tongue/throat, SOB, or low BP? No Did it involve sudden or severe rash/hives, skin peeling, or any reaction on the inside of your mouth or nose? No Did you need to seek medical attention at a hospital or doctor's office? No When did it last happen?      many years If all above answers are "NO", may proceed with cephalosporin use.     Social History   Socioeconomic History   Marital status: Married    Spouse name: Not on file   Number of children: Not on file   Years of education: Not on file   Highest education level: Not on file  Occupational History   Not on file  Tobacco Use   Smoking status: Former    Packs/day: 0.25    Years: 5.00    Pack years: 1.25    Types: Cigarettes    Quit date: 11/15/1968    Years since quitting: 52.5   Smokeless tobacco: Never  Vaping Use   Vaping Use: Never used  Substance and Sexual Activity   Alcohol use: No    Alcohol/week: 0.0 standard drinks   Drug use: No   Sexual activity: Not Currently  Other Topics Concern   Not on file  Social History Narrative   Not on file   Social Determinants of Health    Financial Resource Strain: Not on file  Food Insecurity: Not on file  Transportation Needs: Not on file  Physical Activity: Not on file  Stress: Not on file  Social Connections: Not on file  Intimate Partner Violence: Not on file    ROS Per hpi   Objective   Vitals as reported by the patient: There were no vitals filed for this visit.  Bridgitt was seen today for follow-up.  Diagnoses and all orders for this visit:  Acute bacterial sinusitis -     azithromycin (ZITHROMAX) 250 MG tablet; Take 2  tablets on day 1, then 1 tablet daily on days 2 through 5 -     azelastine (ASTELIN) 0.1 % nasal spray; Place 1 spray into both nostrils 2 (two) times daily. Use in each nostril as directed   PLAN Suspect bacterial sinusitis. Treat as above. ER precautions reviewed with patient who voices understanding Continue tylenol for pain Patient encouraged to call clinic with any questions, comments, or concerns.  I discussed the assessment and treatment plan with the patient. The patient was provided an opportunity to ask questions and all were answered. The patient agreed with the plan and demonstrated an understanding of the instructions.   The patient was advised to call back or seek an in-person evaluation if the symptoms worsen or if the condition fails to improve as anticipated.  I provided 14 minutes of face-to-face time during this encounter.  [name], NP

## 2021-06-06 NOTE — Patient Instructions (Signed)
° ° ° °  If you have lab work done today you will be contacted with your lab results within the next 2 weeks.  If you have not heard from us then please contact us. The fastest way to get your results is to register for My Chart. ° ° °IF you received an x-ray today, you will receive an invoice from [facility]. Please contact [facility] at [phone] with questions or concerns regarding your invoice.  ° °IF you received labwork today, you will receive an invoice from [facility]. Please contact [facility] at [phone] with questions or concerns regarding your invoice.  ° °Our billing staff will not be able to assist you with questions regarding bills from these companies. ° °You will be contacted with the lab results as soon as they are available. The fastest way to get your results is to activate your My Chart account. Instructions are located on the last page of this paperwork. If you have not heard from us regarding the results in 2 weeks, please contact this office. °  ° ° ° °

## 2021-06-11 DIAGNOSIS — Z6829 Body mass index (BMI) 29.0-29.9, adult: Secondary | ICD-10-CM | POA: Diagnosis not present

## 2021-06-11 DIAGNOSIS — M47816 Spondylosis without myelopathy or radiculopathy, lumbar region: Secondary | ICD-10-CM | POA: Diagnosis not present

## 2021-06-17 DIAGNOSIS — Z20822 Contact with and (suspected) exposure to covid-19: Secondary | ICD-10-CM | POA: Diagnosis not present

## 2021-06-18 ENCOUNTER — Ambulatory Visit: Payer: Medicare Other | Admitting: Family Medicine

## 2021-06-19 ENCOUNTER — Other Ambulatory Visit (INDEPENDENT_AMBULATORY_CARE_PROVIDER_SITE_OTHER): Payer: Medicare Other

## 2021-06-19 ENCOUNTER — Other Ambulatory Visit: Payer: Self-pay

## 2021-06-19 DIAGNOSIS — Z23 Encounter for immunization: Secondary | ICD-10-CM | POA: Diagnosis not present

## 2021-06-19 DIAGNOSIS — E890 Postprocedural hypothyroidism: Secondary | ICD-10-CM

## 2021-06-19 LAB — TSH: TSH: 3.41 u[IU]/mL (ref 0.35–5.50)

## 2021-06-30 ENCOUNTER — Other Ambulatory Visit: Payer: Self-pay | Admitting: Orthopedic Surgery

## 2021-06-30 DIAGNOSIS — M720 Palmar fascial fibromatosis [Dupuytren]: Secondary | ICD-10-CM | POA: Diagnosis not present

## 2021-07-04 ENCOUNTER — Other Ambulatory Visit: Payer: Self-pay | Admitting: Family Medicine

## 2021-08-04 ENCOUNTER — Ambulatory Visit: Payer: Medicare Other | Admitting: Physician Assistant

## 2021-08-05 ENCOUNTER — Other Ambulatory Visit: Payer: Self-pay | Admitting: Family Medicine

## 2021-08-21 ENCOUNTER — Ambulatory Visit (INDEPENDENT_AMBULATORY_CARE_PROVIDER_SITE_OTHER): Payer: Medicare Other | Admitting: Family Medicine

## 2021-08-21 ENCOUNTER — Encounter: Payer: Self-pay | Admitting: Family Medicine

## 2021-08-21 VITALS — BP 122/60 | HR 70 | Temp 97.5°F | Resp 16 | Wt 159.4 lb

## 2021-08-21 DIAGNOSIS — K649 Unspecified hemorrhoids: Secondary | ICD-10-CM

## 2021-08-21 MED ORDER — HYDROCORTISONE ACETATE 25 MG RE SUPP: 25.0000 mg | Freq: Two times a day (BID) | RECTAL | 0 refills | Status: DC

## 2021-08-21 NOTE — Patient Instructions (Signed)
Follow up as needed or as scheduled I think the bleeding was due to your hemorrhoids USE the hydrocortisone suppositories to help shrink up the hemorrhoids IF you have more bleeding- let me know! Call with any questions or concerns Hang in there! Happy New Year!!

## 2021-08-21 NOTE — Progress Notes (Signed)
° °  Subjective:    Patient ID: Colleen Wright, female    DOB: 20-Jul-1941, 81 y.o.   MRN: [id number]  HPI Rectal bleeding- sxs started Sunday after eating out.  She vomited after eating and then had BRBPR for 1/2 day.  Had to wear a pad b/c she would have leakage when she stood up.  Also had abd cramping.  No vomiting after initial episode.  Denies diarrhea/constipation.  Denies change in stool color.  No known sick contacts.  No changes in diet or medications.  Has hx of hemorrhoids.  Normal colonoscopy and cologuard in the past.   Review of Systems For ROS see HPI     Objective:   Physical Exam Vitals reviewed. Exam conducted with a chaperone present.  Constitutional:      General: She is not in acute distress.    Appearance: Normal appearance. She is not ill-appearing.  HENT:     Head: Normocephalic and atraumatic.  Eyes:     Extraocular Movements: Extraocular movements intact.     Conjunctiva/sclera: Conjunctivae normal.     Pupils: Pupils are equal, round, and reactive to light.  Cardiovascular:     Rate and Rhythm: Normal rate and regular rhythm.     Pulses: Normal pulses.  Pulmonary:     Effort: Pulmonary effort is normal. No respiratory distress.     Breath sounds: Normal breath sounds.  Genitourinary:    Rectum: External hemorrhoid (no current bleeding or oozing) present. No tenderness or anal fissure.  Skin:    General: Skin is warm and dry.  Neurological:     Mental Status: She is alert and oriented to person, place, and time. Mental status is at baseline.  Psychiatric:        Mood and Affect: Mood normal.        Behavior: Behavior normal.        Thought Content: Thought content normal.          Assessment & Plan:   Bleeding hemorrhoid- new.  Pt had BRBPR on Sunday and this stopped on Monday.  Of note, she did vomit after eating out and thinks she may have had 'bad salad'.  She has not had bleeding x4 days.  She does have moderate sized external hemorrhoid that  is the likely source of bleeding.  Possibly triggered by increased intrabdominal pressure from vomiting.  No current bleeding.  Will use hydrocortisone suppositories to shrink hemorrhoid.  Pt is to notify if she again has bleeding.  Pt expressed understanding and is in agreement w/ plan.

## 2021-08-26 ENCOUNTER — Other Ambulatory Visit: Payer: Self-pay | Admitting: Family Medicine

## 2021-09-07 NOTE — Progress Notes (Signed)
Cardiology Office Note:    Date:  09/11/2021   ID:  Colleen Wright, DOB [birth year], MRN [id number]  PCP:  Midge Minium, MD   [facility] Providers Cardiologist:  Sanda Klein, MD Cardiology APP:  Ledora Bottcher, PA { Referring MD: Midge Minium, MD   Chief Complaint  Patient presents with   Follow-up    Afib    History of Present Illness:    Colleen Wright is a 81 y.o. female with a hx of paroxysmal atrial fibrillation on chronic anticoagulation, cryptogenic stroke, hypertension, CAD, carotid artery stenosis, and hyperlipidemia.  She suffered a stroke which led to implantable loop recorder which detected asymptomatic atrial fibrillation.  She is anticoagulated with Eliquis 5 mg twice daily.  Hypertension is controlled with 50 mg Toprol and losartan-HCTZ.  Hyperlipidemia is controlled with 40 mg atorvastatin.  Carotid artery duplex in December 2020 showed 50 to 69% stenosis in the left ICA.  Loop recorder has since reached end of service.  However, while functional the ILR showed relatively low A. fib burden.  Left heart catheterization in June 2017 showed moderate to severe RCA branch stenosis that was treated with medical therapy due to tortuosity of the proximal vessel.  She was last seen in clinic by Dr. Sallyanne Kuster on 01/24/2020 and was doing well. She was hospitalized 04/2020 with vertigo and falls. She was unable to care for herself at home.  I saw her in clinic 05/05/21 and she reported an episode at a restaurant in which she could not speak wfor 10 min but did not seek help. She also suspected her cholesterol medicine was causing her dizziness, but was not interested in changing medications (e.g., adding zetia) at that time. She also reported only taking eliquis once per day due to cost. I provided patient assistance.   She presents for scheduled follow up. She recounts her sister's passing last August from cancer.  She is very concerned about the cost of eliquis, her  insurance has changed to co-insurance. Appreciate help from [facility] regarding patient assistance. She continues to only take eliquis once daily to make her prescription last longer. We had a very long discussion about her prior stroke and stroke risk. We discussed coumadin - she will consider this if she can get INR checks at her PCP instead of coming to [place]. I will send a message to Dr. Birdie Riddle. We discussed her daily fatigue. She is very stable on her current lipitor with stable carotid artery stenosis. She is also very stable on her current toprol dose. We discuss trying lower doses of each but she ultimately decided to continue her present regimen.    Past Medical History:  Diagnosis Date   Adenomatous colon polyp 2006   Allergy    Arthritis    low back   Back pain 06/13/2011   CAD (coronary artery disease) 06/12/2011   Depression    GERD (gastroesophageal reflux disease)    Hiatal hernia    Hyperlipidemia    Hypertension    Hypothyroidism 06/12/2011   Leg fracture    Pain in joint, ankle and foot 11/02/2012   Pain of left heel 05/23/2012   Skin lesions, generalized 05/23/2012   Thyroid cancer ([place])    Ulcer    Vertigo 06/13/2011    Past Surgical History:  Procedure Laterality Date   BALLOON ANGIOPLASTY, ARTERY     CARDIAC CATHETERIZATION N/A 01/24/2016   Procedure: Left Heart Cath and Coronary Angiography;  Surgeon: Peter M Martinique, MD;  Location: [place] [facility];  Service: Cardiovascular;  Laterality: N/A;   EP IMPLANTABLE DEVICE N/A 09/24/2015   Procedure: Loop Recorder Insertion;  Surgeon: Sanda Klein, MD;  Location: [facility];  Service: Cardiovascular;  Laterality: N/A;   FRACTURE SURGERY     left leg, pin and 5 bolts in place in lower tibia   LESION REMOVAL     vaginal   THYROIDECTOMY, PARTIAL     twice   TUBAL LIGATION     growth removed from posterior vaginal vault    Current Medications: Current Meds  Medication Sig   ALPRAZolam (XANAX) 0.5 MG tablet  TAKE 1 TABLET BY MOUTH AT BEDTIME AS NEEDED FOR ANXIETY   apixaban (ELIQUIS) 5 MG TABS tablet Take 1 tablet (5 mg total) by mouth 2 (two) times daily.   atorvastatin (LIPITOR) 40 MG tablet TAKE 1 TABLET BY MOUTH EVERY DAY IN THE EVENING   azelastine (ASTELIN) 0.1 % nasal spray Place 1 spray into both nostrils 2 (two) times daily. Use in each nostril as directed   cetirizine (ZYRTEC) 10 MG tablet TAKE 1 TABLET BY MOUTH EVERY DAY   ELIQUIS 5 MG TABS tablet TAKE 1 TABLET BY MOUTH TWICE A DAY   levothyroxine (SYNTHROID) 112 MCG tablet TAKE 1 TABLET BY MOUTH EVERY DAY   losartan-hydrochlorothiazide (HYZAAR) 50-12.5 MG tablet Take 1 tablet by mouth daily.   meclizine (ANTIVERT) 25 MG tablet Take 1 tablet (25 mg total) by mouth 3 (three) times daily as needed for dizziness.   metoprolol succinate (TOPROL-XL) 50 MG 24 hr tablet TAKE 1 TABLET BY MOUTH EVERY DAY   tiZANidine (ZANAFLEX) 4 MG tablet Take 1 tablet (4 mg total) by mouth every 8 (eight) hours as needed for muscle spasms.   [DISCONTINUED] cetirizine (ZYRTEC) 10 MG tablet Take 1 tablet (10 mg total) by mouth daily.     Allergies:   Amoxicillin   Social History   Socioeconomic History   Marital status: Married    Spouse name: Not on file   Number of children: Not on file   Years of education: Not on file   Highest education level: Not on file  Occupational History   Not on file  Tobacco Use   Smoking status: Former    Packs/day: 0.25    Years: 5.00    Pack years: 1.25    Types: Cigarettes    Quit date: 11/15/1968    Years since quitting: 52.8   Smokeless tobacco: Never  Vaping Use   Vaping Use: Never used  Substance and Sexual Activity   Alcohol use: No    Alcohol/week: 0.0 standard drinks   Drug use: No   Sexual activity: Not Currently  Other Topics Concern   Not on file  Social History Narrative   Not on file   Social Determinants of Health   Financial Resource Strain: Not on file  Food Insecurity: Not on file   Transportation Needs: Not on file  Physical Activity: Not on file  Stress: Not on file  Social Connections: Not on file     Family History: The patient's family history includes Cancer in her cousin, mother, and sister; Heart disease in her brother, father, and sister; Hypertension in her sister; Stroke in her mother and sister.  ROS:   Please see the history of present illness.     All other systems reviewed and are negative.  EKGs/Labs/Other Studies Reviewed:    The following studies were reviewed today:  Carotid artery  [place] 04/2021: Summary:  Right Carotid: Velocities in the right ICA are consistent with a 1-39%  stenosis.                 Non-hemodynamically significant plaque <50% noted in the  CCA.                 Essentially stable RICA velocities.   Left Carotid: Velocities in the left ICA are consistent with a 40-59%  stenosis.                Non-hemodynamically significant plaque <50% noted in the  CCA.                Essentially stable LICA velocities; increase PSV when  compared to                prior exam.   Vertebrals:  Bilateral vertebral arteries demonstrate antegrade flow.  Subclavians: Left subclavian artery flow was disturbed. Normal flow  hemodynamics               were seen in the right subclavian artery.  EKG:  EKG is not ordered today.    Recent Labs: 05/31/2021: ALT 19; BUN 21; Creatinine, Ser 1.03; Hemoglobin 12.4; Platelets 176; Potassium 4.0; Sodium 137 06/19/2021: TSH 3.41  Recent Lipid Panel    Component Value Date/Time   CHOL 138 05/16/2021 1220   TRIG 125.0 05/16/2021 1220   HDL 45.00 05/16/2021 1220   CHOLHDL 3 05/16/2021 1220   VLDL 25.0 05/16/2021 1220   LDLCALC 68 05/16/2021 1220     Risk Assessment/Calculations:    CHA2DS2-VASc Score = 7   This indicates a 11.2% annual risk of stroke. The patient's score is based upon: CHF History: 0 HTN History: 1 Diabetes History: 0 Stroke History: 2 Vascular Disease History: 1 Age  Score: 2 Gender Score: 1       Physical Exam:    VS:  BP 120/70 (BP Location: Left Arm, Patient Position: Sitting, Cuff Size: Normal)    Pulse 77    Ht 5\' 2"  (1.575 m)    Wt 160 lb 6.4 oz (72.8 kg)    SpO2 94%    BMI 29.34 kg/m     Wt Readings from Last 3 Encounters:  09/11/21 160 lb 6.4 oz (72.8 kg)  08/21/21 159 lb 6.4 oz (72.3 kg)  05/31/21 160 lb (72.6 kg)     GEN: elderly female in NAD HEENT: Normal NECK: No JVD; No carotid bruits LYMPHATICS: No lymphadenopathy CARDIAC: irregular rhythm, regular rate, no murmurs, rubs, gallops RESPIRATORY:  Clear to auscultation without rales, wheezing or rhonchi  ABDOMEN: Soft, non-tender, non-distended MUSCULOSKELETAL:  No edema; No deformity  SKIN: Warm and dry NEUROLOGIC:  Alert and oriented x 3 PSYCHIATRIC:  Normal affect   ASSESSMENT:    1. Paroxysmal atrial fibrillation (HCC)   2. Long term (current) use of anticoagulants   3. TIA (transient ischemic attack)   4. Cerebrovascular disease   5. Coronary artery disease of native artery of native heart with stable angina pectoris ([place])   6. Bilateral carotid artery stenosis   7. Hyperlipidemia, unspecified hyperlipidemia type    PLAN:    In order of problems listed above:  PAF Chronic anticoagulation Hx of CVA Possible TIA ?04/2021 This patients CHA2DS2-VASc Score and unadjusted Ischemic Stroke Rate (% per year) is equal to 11.2 % stroke rate/year from a score of 7 (1age, female, HTN, CAD, CVA2) - anticoagulated with eliquis - but she continues to only  takes 1 per day instead of BID + 81 mg ASA - correct dose with weight 67 kg - she filled out patient assistance, but makes too much with her husband working - she will qualify for free eliquis after she has spent 3% of her income (includes husband's income and medication costs)   CAD - treated medically - no ASA in the setting of eliquis - continue BB and statin - at last visit, she reported atypical chest pain but did not  want to pursue ischemic evaluation at that time   Hyperlipidemia - continue statin, she did not want to try to add zetia in an attempt to reduce dose of satin 05/16/2021: Cholesterol 138; HDL 45.00; LDL Cholesterol 68; Triglycerides 125.0; VLDL 25.0   Carotid artery stenosis - last duplex 07/2019 - given possible TIA in 04/2021, I repeated carotid dopplers which showed 1-39% stenosis in the right ICA and 40-59% stenosis in the left ICA, unchanged from 2020 study - continue risk factor management   Dr. Birdie Riddle - does your clinic manage coumadin? If so, I think she is interested in exploring this instead of eliquis.    Follow up with in 1 year.      Medication Adjustments/Labs and Tests Ordered: Current medicines are reviewed at length with the patient today.  Concerns regarding medicines are outlined above.  No orders of the defined types were placed in this encounter.  No orders of the defined types were placed in this encounter.   Patient Instructions  Medication Instructions:  Your physician recommends that you continue on your current medications as directed. Please refer to the Current Medication list given to you today.  Eliquis 5 mg bid samples and pt assistance forms given today  *If you need a refill on your cardiac medications before your next appointment, please call your pharmacy*   Lab Work: NONE ordered at this time of appointment   If you have labs (blood work) drawn today and your tests are completely normal, you will receive your results only by: [place] (if you have MyChart) OR A paper copy in the mail If you have any lab test that is abnormal or we need to change your treatment, we will call you to review the results.   Testing/Procedures: NONE ordered at this time of appointment     Follow-Up: At [facility], you and your health needs are our priority.  As part of our continuing mission to provide you with exceptional heart care, we have  created designated Provider Care Teams.  These Care Teams include your primary Cardiologist (physician) and Advanced Practice Providers (APPs -  Physician Assistants and Nurse Practitioners) who all work together to provide you with the care you need, when you need it.  We recommend signing up for the patient portal called "MyChart".  Sign up information is provided on this After Visit Summary.  MyChart is used to connect with patients for Virtual Visits (Telemedicine).  Patients are able to view lab/test results, encounter notes, upcoming appointments, etc.  Non-urgent messages can be sent to your provider as well.   To learn more about what you can do with MyChart, go to [email or web address].    Your next appointment:   1 year(s)  The format for your next appointment:   In Person  Provider:   Or APP Sanda Klein, MD     Other Instructions None    Signed, Ledora Bottcher, [place]  09/11/2021 3:46 PM    [facility]

## 2021-09-08 ENCOUNTER — Other Ambulatory Visit: Payer: Self-pay

## 2021-09-08 ENCOUNTER — Telehealth: Payer: Self-pay

## 2021-09-08 MED ORDER — ATORVASTATIN CALCIUM 40 MG PO TABS: ORAL_TABLET | ORAL | 0 refills | Status: DC

## 2021-09-08 NOTE — Telephone Encounter (Signed)
Rx sent for 90 days

## 2021-09-08 NOTE — Telephone Encounter (Signed)
Caller name:[place] callback [phone]  Encourage patient to contact the pharmacy for refills or they can request refills through [facility]  (Please schedule appointment if patient has not been seen in over a year)  MEDICATION NAME & DOSE:atorvastatin (LIPITOR) 40 MG tablet   Notes/Comments from patient: Pt wants this switch to 90 days supply all other medications are [place] TO: [id number] - [place], [place] - [address] [place] [address] [place] [address]   Please notify patient: It takes 48-72 hours to process rx refill requests Ask patient to call pharmacy to ensure rx is ready before heading there.   (CLINICAL TO FILL OR ROUTE PER PROTOCOLS)

## 2021-09-11 ENCOUNTER — Ambulatory Visit (INDEPENDENT_AMBULATORY_CARE_PROVIDER_SITE_OTHER): Payer: Medicare Other | Admitting: Physician Assistant

## 2021-09-11 ENCOUNTER — Encounter: Payer: Self-pay | Admitting: Physician Assistant

## 2021-09-11 ENCOUNTER — Other Ambulatory Visit: Payer: Self-pay

## 2021-09-11 VITALS — BP 120/70 | HR 77 | Ht 62.0 in | Wt 160.4 lb

## 2021-09-11 DIAGNOSIS — E785 Hyperlipidemia, unspecified: Secondary | ICD-10-CM

## 2021-09-11 DIAGNOSIS — G459 Transient cerebral ischemic attack, unspecified: Secondary | ICD-10-CM

## 2021-09-11 DIAGNOSIS — I25118 Atherosclerotic heart disease of native coronary artery with other forms of angina pectoris: Secondary | ICD-10-CM

## 2021-09-11 DIAGNOSIS — I6523 Occlusion and stenosis of bilateral carotid arteries: Secondary | ICD-10-CM

## 2021-09-11 DIAGNOSIS — I679 Cerebrovascular disease, unspecified: Secondary | ICD-10-CM

## 2021-09-11 DIAGNOSIS — Z7901 Long term (current) use of anticoagulants: Secondary | ICD-10-CM

## 2021-09-11 DIAGNOSIS — I480 Paroxysmal atrial fibrillation: Secondary | ICD-10-CM | POA: Diagnosis not present

## 2021-09-11 NOTE — Patient Instructions (Signed)
Medication Instructions:  Your physician recommends that you continue on your current medications as directed. Please refer to the Current Medication list given to you today.  Eliquis 5 mg bid samples and pt assistance forms given today  *If you need a refill on your cardiac medications before your next appointment, please call your pharmacy*   Lab Work: NONE ordered at this time of appointment   If you have labs (blood work) drawn today and your tests are completely normal, you will receive your results only by: [place] (if you have MyChart) OR A paper copy in the mail If you have any lab test that is abnormal or we need to change your treatment, we will call you to review the results.   Testing/Procedures: NONE ordered at this time of appointment     Follow-Up: At [facility], you and your health needs are our priority.  As part of our continuing mission to provide you with exceptional heart care, we have created designated Provider Care Teams.  These Care Teams include your primary Cardiologist (physician) and Advanced Practice Providers (APPs -  Physician Assistants and Nurse Practitioners) who all work together to provide you with the care you need, when you need it.  We recommend signing up for the patient portal called "MyChart".  Sign up information is provided on this After Visit Summary.  MyChart is used to connect with patients for Virtual Visits (Telemedicine).  Patients are able to view lab/test results, encounter notes, upcoming appointments, etc.  Non-urgent messages can be sent to your provider as well.   To learn more about what you can do with MyChart, go to [email or web address].    Your next appointment:   1 year(s)  The format for your next appointment:   In Person  Provider:   Or APP Sanda Klein, MD     Other Instructions None

## 2021-09-14 ENCOUNTER — Telehealth: Payer: Self-pay | Admitting: Physician Assistant

## 2021-09-14 NOTE — Telephone Encounter (Signed)
Ms. Lewan, Your PCP does not manage coumadin at their site. Dr. Sallyanne Kuster would prefer to keep you on the eliquis twice daily. You are at very high risk for stroke only taking once daily. Please let us know if you need any assistance filling out the eliquis paperwork to get assistance with cost from the company.   Thanks Angie

## 2021-09-18 ENCOUNTER — Telehealth: Payer: Self-pay

## 2021-09-18 NOTE — Telephone Encounter (Signed)
Called patient Colleen Wright and informed her of [name], [place] and Dr. Sallyanne Kuster recommends of taking the Eliquis BID instead of once daily due to being at high risk for a stroke. Ask her to confirm her address so that I can mail her an application for the Eliquis patient assistance program. Asked to fill out the application as soon as possible and to bring it into the office or that she can mail it to the foundation just to give our office a call so that the prescriber part of application can be fax to the foundation. Mrs. Yano verbalized understanding and all (if any) questions were answered.

## 2021-09-18 NOTE — Telephone Encounter (Signed)
Mailed Eliquis application to patient to address on file.

## 2021-10-01 IMAGING — CT CT HEAD W/O CM
3 series · 14 of 47 positions shown, 16 images · non-contrast
Comparison: Head CT 08/09/2015, brain MRI 08/10/2015

CLINICAL DATA: Mental status change, unknown cause

Dizziness, weakness and vomiting. Right-sided headache for 1 month,
progressive over the last few days.
EXAM:
CT HEAD WITHOUT CONTRAST
TECHNIQUE: Contiguous axial images were obtained from the base of the skull
through the vertex without intravenous contrast.

[Series 2: head wo · axial · 0.47mm/px · z∈[-160,-25]mm · 8 of 33 slices shown, 10 images]
[im 3/33  brain]
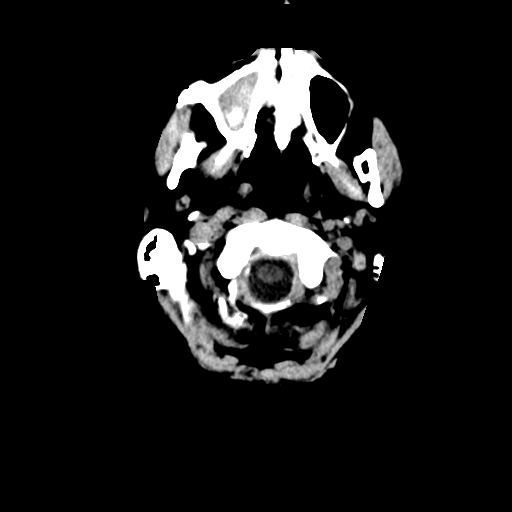
[im 3/33  bone]
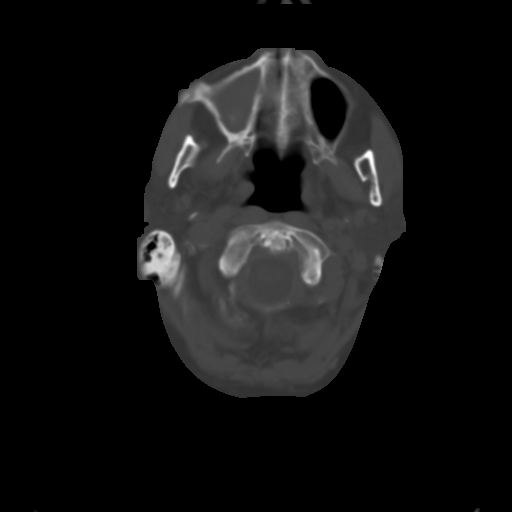
[im 7/33  brain]
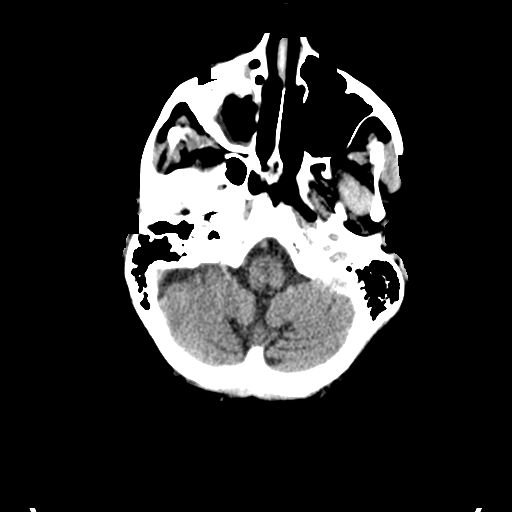
[im 10/33  brain]
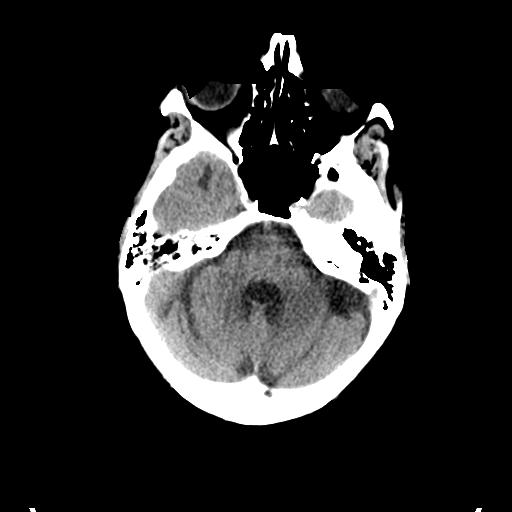
[im 15/33  brain]
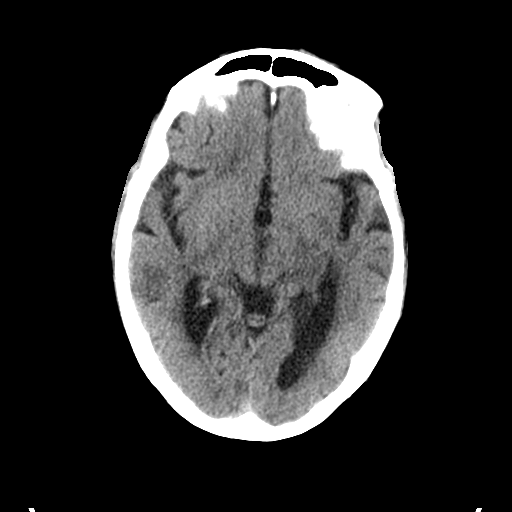
[im 18/33  brain]
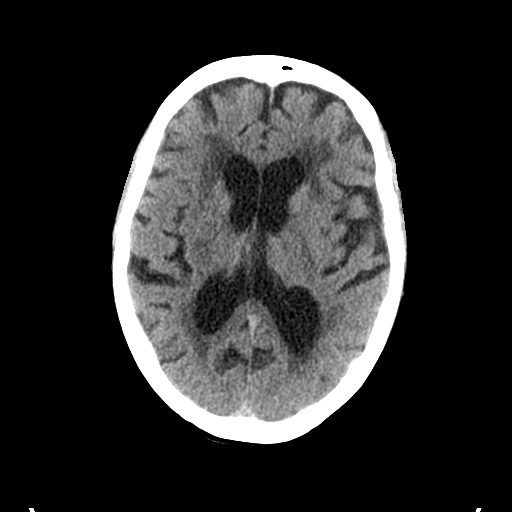
[im 18/33  bone]
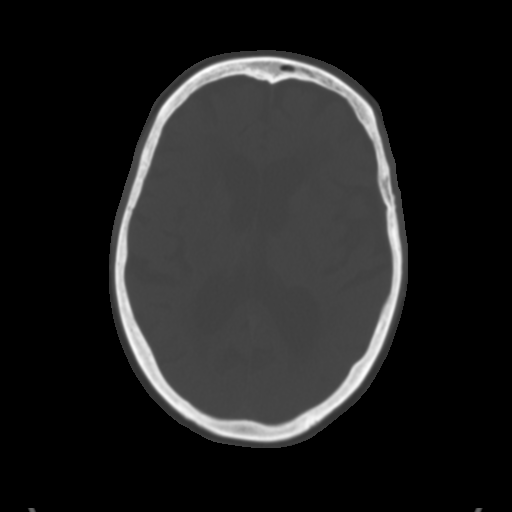
[im 23/33  brain]
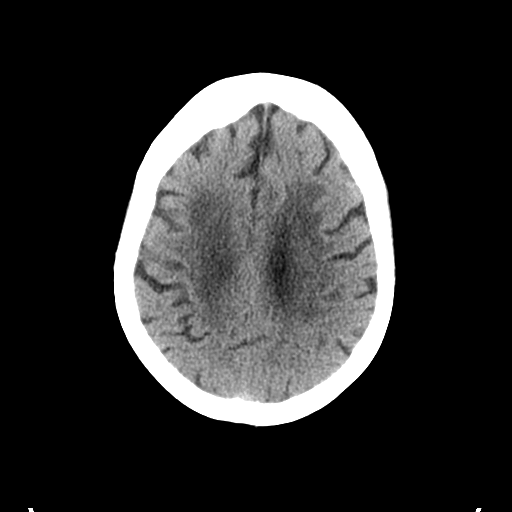
[im 26/33  brain]
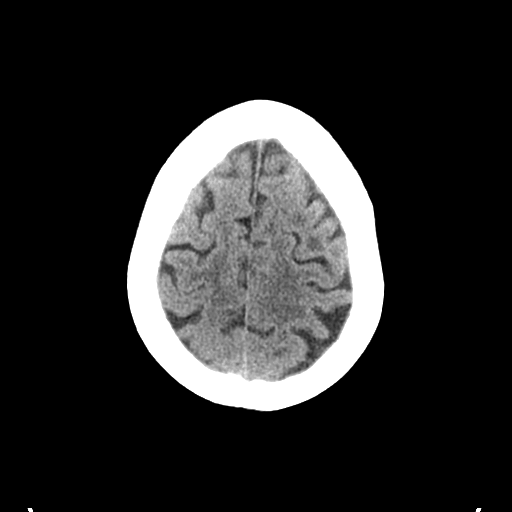
[im 30/33  brain]
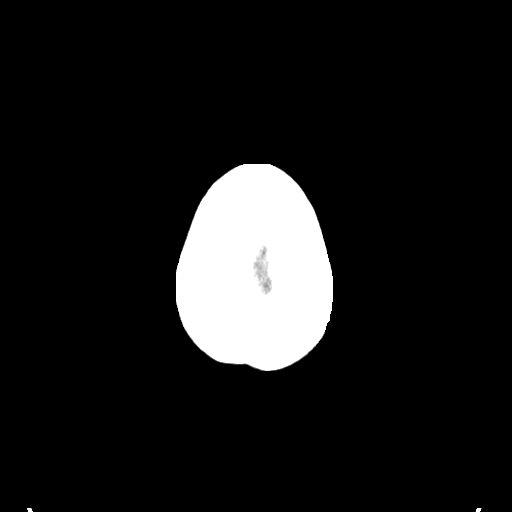

[Series 5: coronal soft tissue · coronal · 0.29mm/px · 3 of 79 slices shown]
[im 27/79  brain]
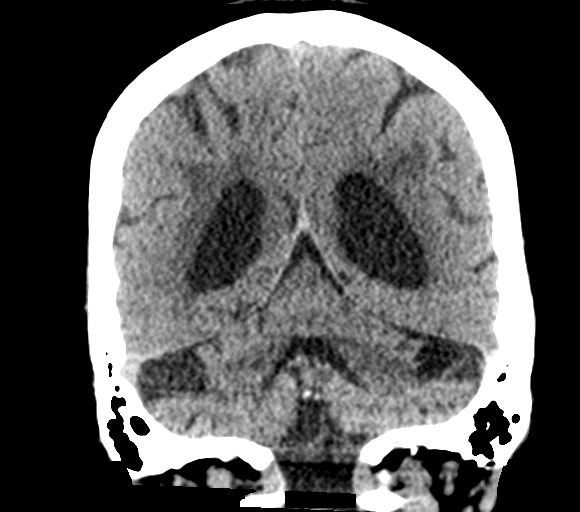
[im 35/79  brain]
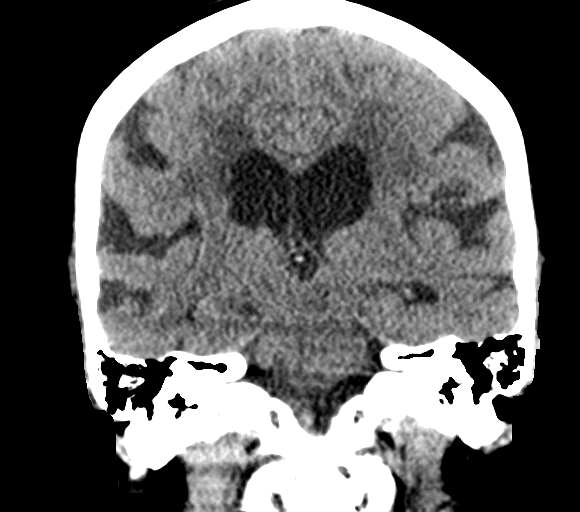
[im 44/79  brain]
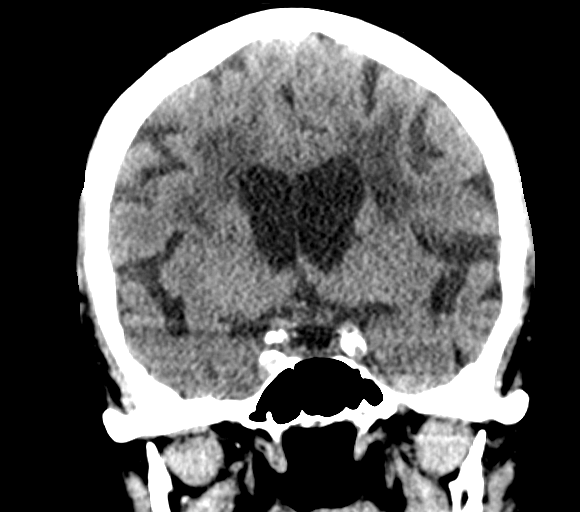

[Series 6: sagittal soft tissue · sagittal · 0.29mm/px · 3 of 57 slices shown]
[im 19/57  brain]
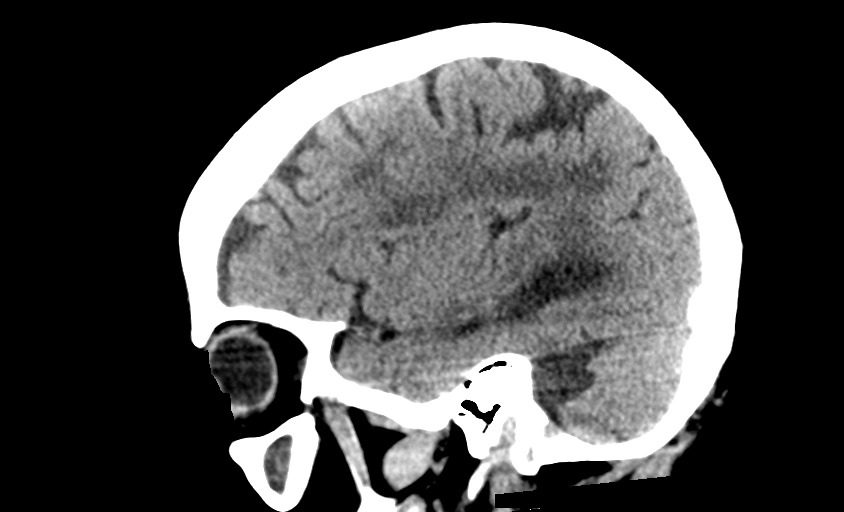
[im 29/57  brain]
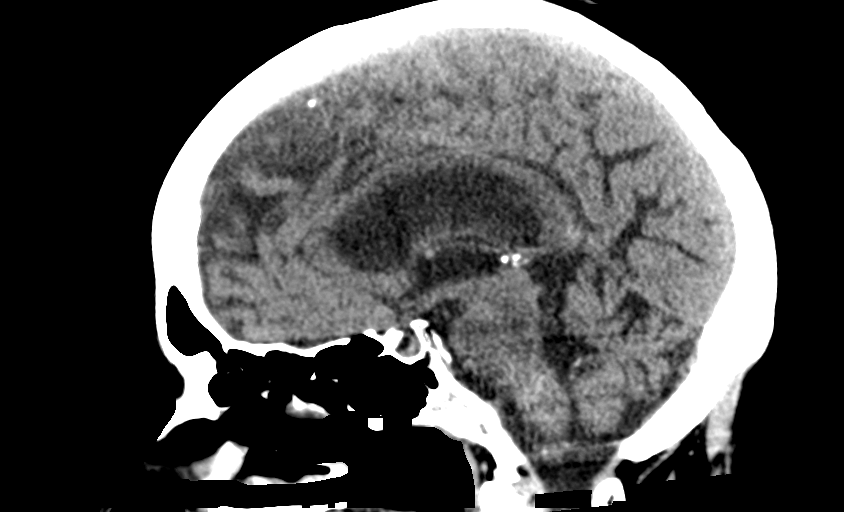
[im 38/57  brain]
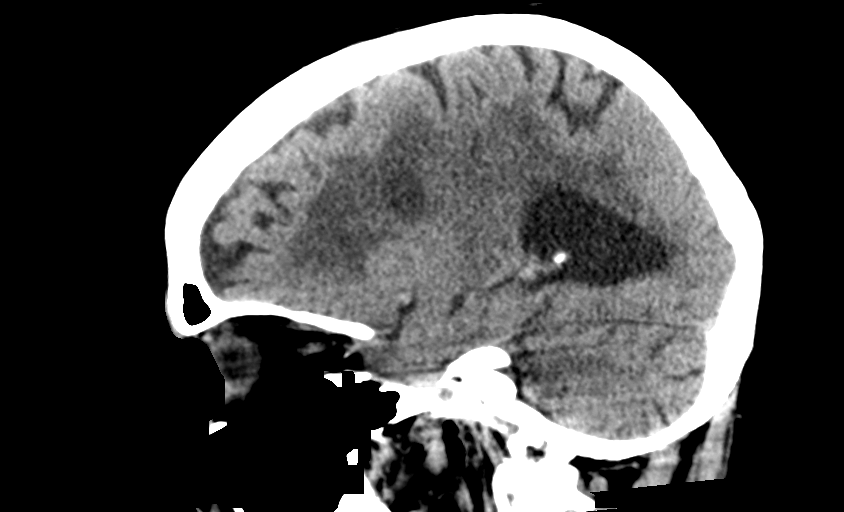

[14 of 47 positions shown; findings below may reference images not displayed]

FINDINGS: Brain: Stable generalized atrophy. Chronic small vessel ischemia
with progression from 2024. Remote lacunar infarcts in the left
basal ganglia. No intracranial hemorrhage, mass effect, or midline
shift. No hydrocephalus. The basilar cisterns are patent. No
evidence of territorial infarct or acute ischemia. No extra-axial or
intracranial fluid collection.

Vascular: Atherosclerosis of skullbase vasculature without
hyperdense vessel or abnormal calcification.

Skull: No fracture or focal lesion.

Sinuses/Orbits: Chronic but progressive opacification of the right
maxillary sinus with hyperdense material centrally and increased
cortical thickening from prior exam. Remaining paranasal sinuses are
clear. No acute orbital abnormality.

Other: None.
IMPRESSION: 1. No acute intracranial abnormality.
2. Generalized atrophy. Progressive chronic small vessel ischemia
from 2024 comparison.
3. Chronic but progressive opacification of the right maxillary
sinus with hyperdense material centrally and increased cortical
thickening from prior exam.

## 2021-10-01 IMAGING — MR MR HEAD W/O CM
10 series · 46 of 48 positions shown · non-contrast
Comparison: 05/10/2020 head CT.  08/10/2015 MRI head and prior.

CLINICAL DATA: Dizziness

EXAM:
MRI HEAD WITHOUT CONTRAST
TECHNIQUE: Multiplanar, multiecho pulse sequences of the brain and surrounding
structures were obtained without intravenous contrast.

[Series 5: dwi_tracew · axial · 3.0mm · 1.08mm/px · z∈[-64,+86]mm · 10 of 108 slices shown]
[im 1/108]
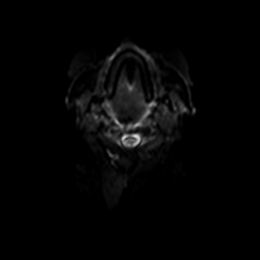
[im 10/108]
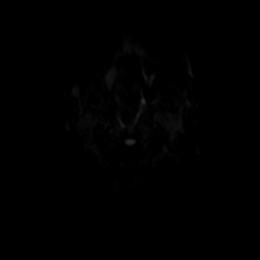
[im 20/108]
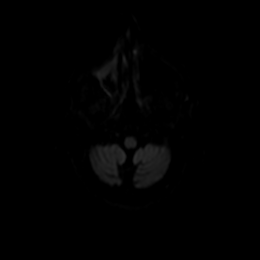
[im 30/108]
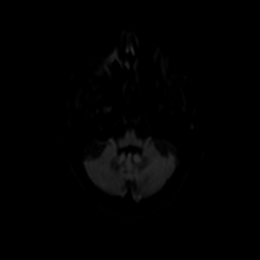
[im 39/108]
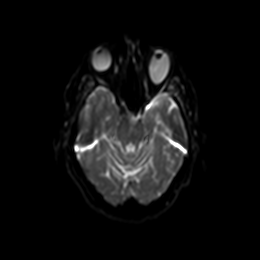
[im 49/108]
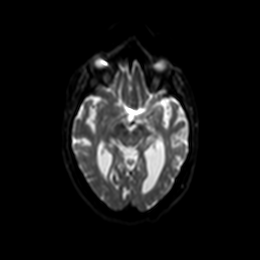
[im 59/108]
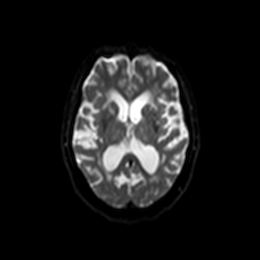
[im 78/108]
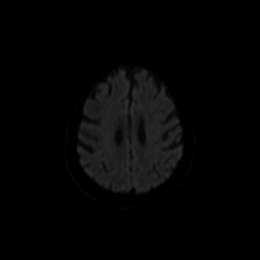
[im 88/108]
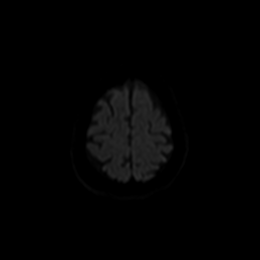
[im 108/108]
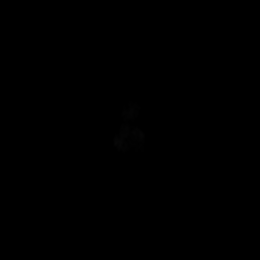

[Series 6: dwi_adc · axial · 3.0mm · 1.08mm/px · z∈[-64,+86]mm · 5 of 54 slices shown]
[im 1/54]
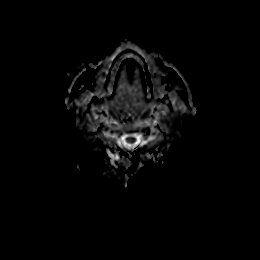
[im 14/54]
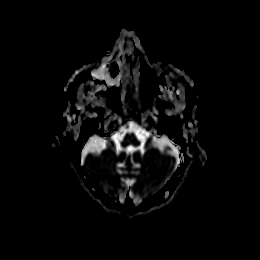
[im 27/54]
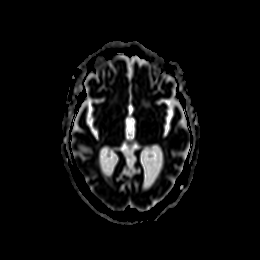
[im 40/54]
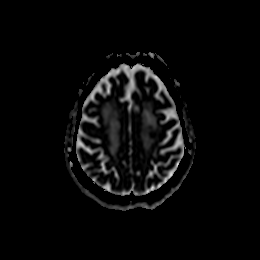
[im 54/54]
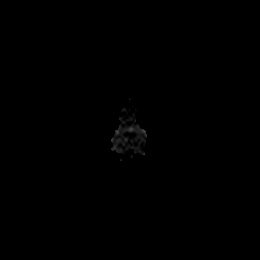

[Series 7: T2 · sagittal · 5.0mm · 0.47mm/px · 2 of 24 slices shown (1 of 3)]
[im 1/24]
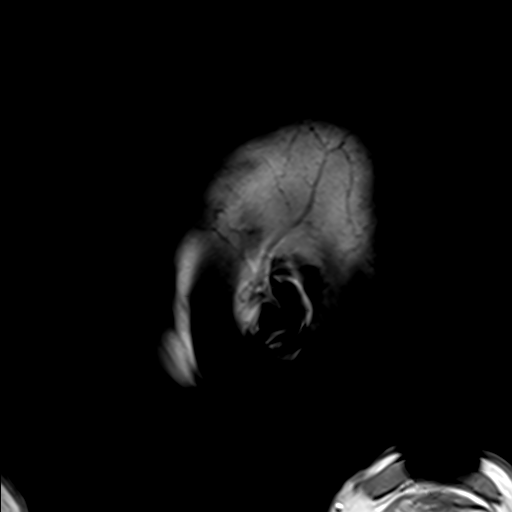
[im 24/24]
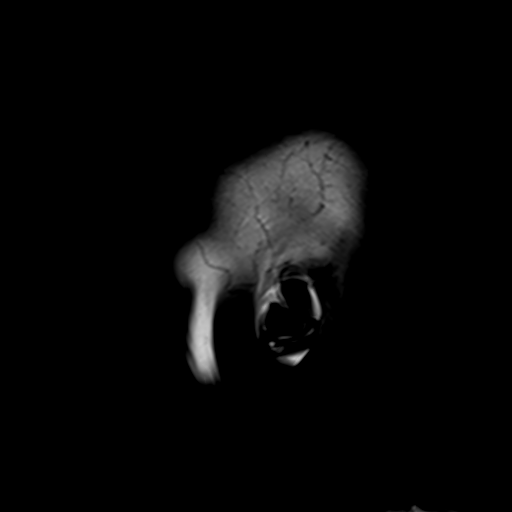

[Series 8: T2 · axial · 5.0mm · 0.45mm/px · z∈[-53,+88]mm · 2 of 24 slices shown (2 of 3)]
[im 1/24]
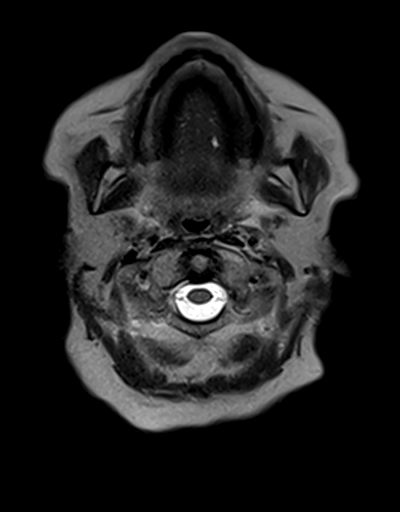
[im 24/24]
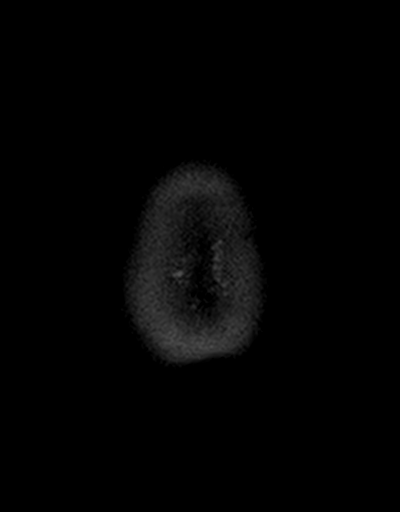

[Series 9: GRE · axial · 3.0mm · 0.45mm/px · z∈[-54,+90]mm · 5 of 52 slices shown]
[im 1/52]
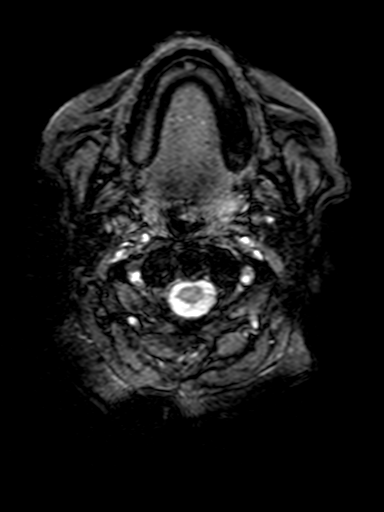
[im 13/52]
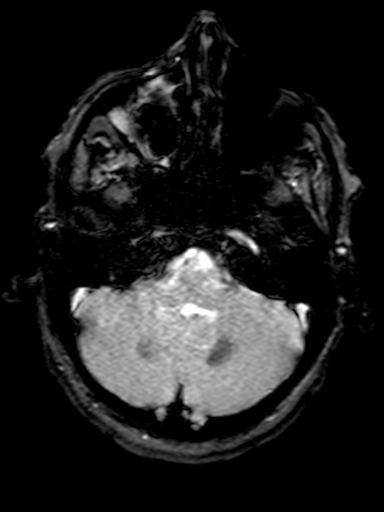
[im 26/52]
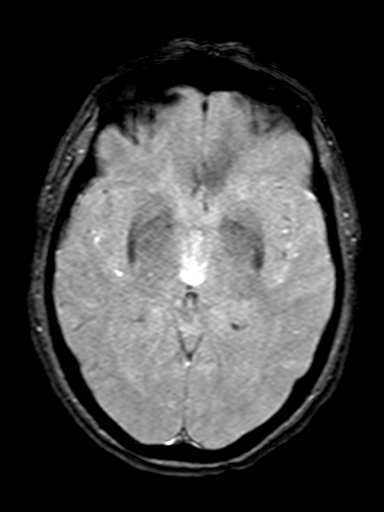
[im 39/52]
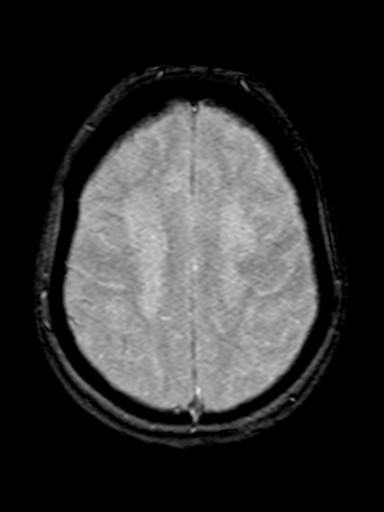
[im 52/52]
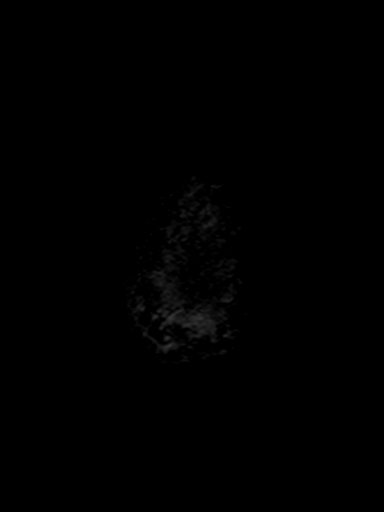

[Series 10: FLAIR · axial · 3.0mm · 0.86mm/px · z∈[-55,+87]mm · 5 of 51 slices shown]
[im 1/51]
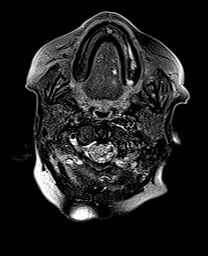
[im 13/51]
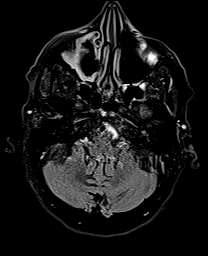
[im 26/51]
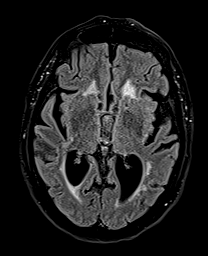
[im 38/51]
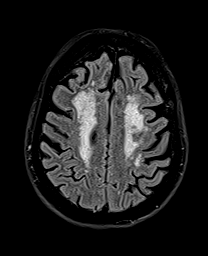
[im 51/51]
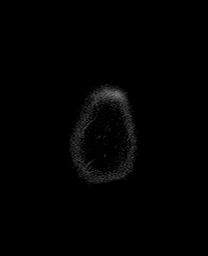

[Series 11: T1 · axial · 3.0mm · 0.45mm/px · z∈[-58,+92]mm · 5 of 54 slices shown]
[im 1/54]
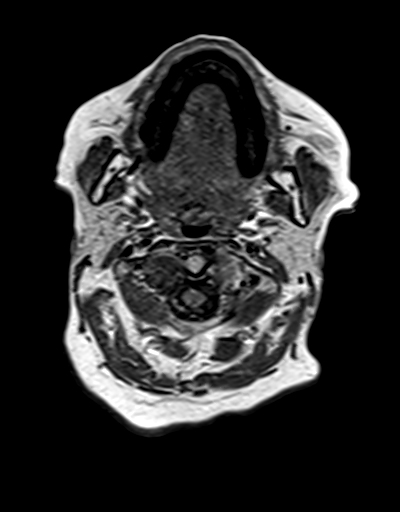
[im 14/54]
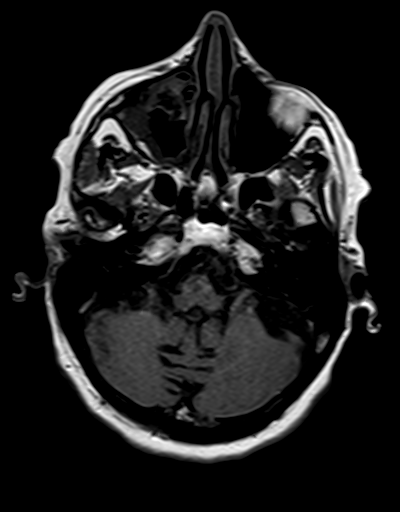
[im 27/54]
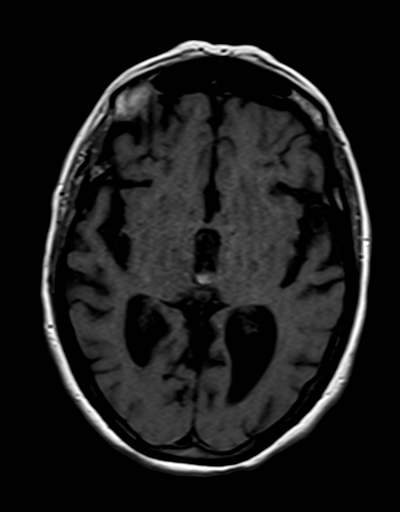
[im 40/54]
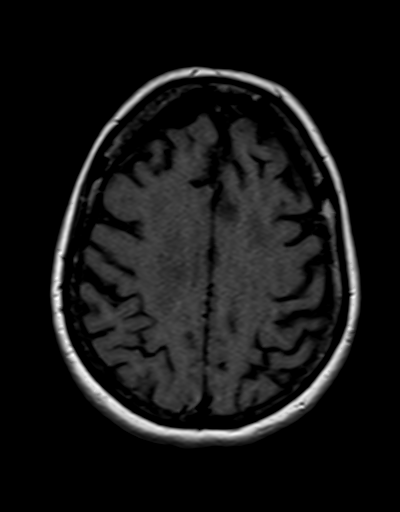
[im 54/54]
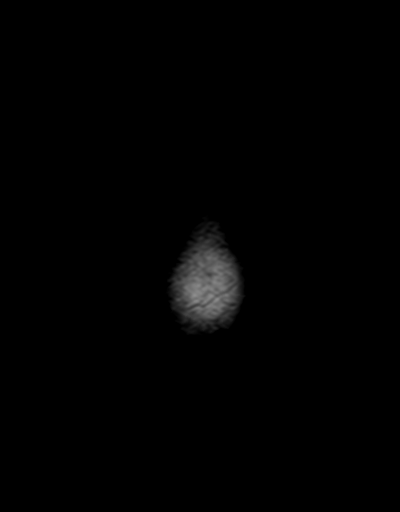

[Series 12: DWI · coronal · 5.0mm · 1.31mm/px · 6 of 56 slices shown (1 of 2)]
[im 1/56]
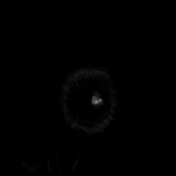
[im 12/56]
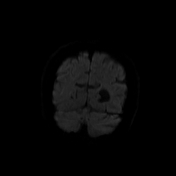
[im 23/56]
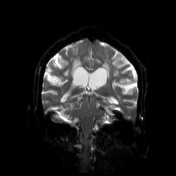
[im 34/56]
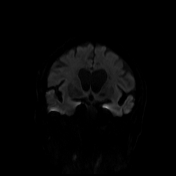
[im 45/56]
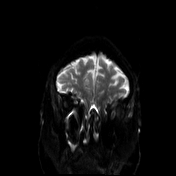
[im 56/56]
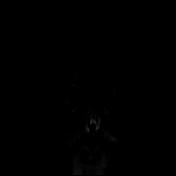

[Series 13: DWI · coronal · 5.0mm · 1.31mm/px · 3 of 28 slices shown (2 of 2)]
[im 1/28]
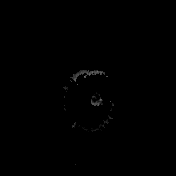
[im 14/28]
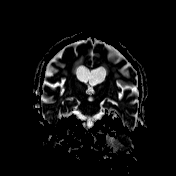
[im 28/28]
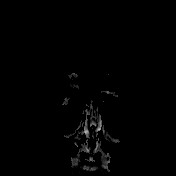

[Series 14: T2 · coronal · 5.0mm · 0.86mm/px · 3 of 28 slices shown (3 of 3)]
[im 1/28]
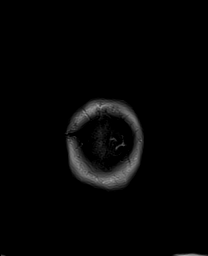
[im 14/28]
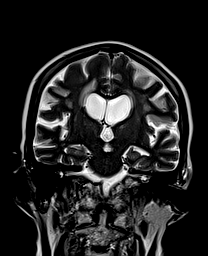
[im 28/28]
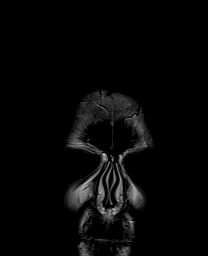

[46 of 48 positions shown; findings below may reference images not displayed]

FINDINGS: Brain: No diffusion-weighted signal abnormality. No intracranial
hemorrhage. Moderate cerebral atrophy with ex vacuo dilatation.
Advanced chronic microvascular ischemic changes. Sequela of remote
left corona radiata lacunar insult. No midline shift,
ventriculomegaly or extra-axial fluid collection. No mass lesion.

Vascular: Grossly preserved proximal major intracranial flow voids.

Skull and upper cervical spine: Normal marrow signal.

Sinuses/Orbits: Normal orbits. Moderate right maxillary sinus
mucosal thickening with central T1 hyper/T2 hypointense signal.
Trace bilateral mastoid effusion.

Other: None.
IMPRESSION: No acute intracranial process.  Remote left corona radiata insult.

Moderate cerebral atrophy. Advanced chronic microvascular ischemic
changes.

Sequela of chronic allergic fungal sinusitis involving the right
maxillary sinus.

## 2021-10-20 ENCOUNTER — Telehealth: Payer: Self-pay | Admitting: Pharmacist

## 2021-10-20 NOTE — Progress Notes (Signed)
? ? ?Chronic Care Management ?Pharmacy Assistant  ? ?Name: Colleen Wright  MRN: [id number] DOB: [birth year] ? ? ?Reason for Encounter: Disease State - General Adherence Call  ?  ? ?Recent office visits:  ?08/21/21 Colleen Asa, MD - Family Medicine- Bleeding hemorrhoid - hydrocortisone (ANUSOL-HC) 25 MG suppository prescribed. Follow up as needed.  ? ?06/06/21 Colleen Coss, NP - Family Medicine - Acute bacterial sinusitis - (Video visit) - azelastine (ASTELIN) 0.1 % nasal spray and azithromycin (ZITHROMAX) 250 MG tablet prescribed. Follow up as needed.  ? ?05/16/21 Colleen Asa, MD - Family Medicine - Hypertension - Labs were ordered. MRI Lumbar spine ordered. Follow up in 6 months.  ? ?Recent consult visits:  ?09/11/21 Colleen Wright - Cardiology - Paroxysmal A Fib - cetirizine (ZYRTEC) 10 MG tablet prescribed. Follow up in 1 year.  ? ?06/17/21 Colleen Wright with COVID 19 - No notes available.  ? ?06/11/21 Colleen Wright - Spondylosis - No notes available.  ? 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  ? ?[facility] visits: 05/31/21 ?Medication Reconciliation was completed by comparing discharge summary, patient?s EMR and Pharmacy list, and upon discussion with patient. ? ?Admitted to the [facility] on 05/31/21 due to Headache. Discharge date was 06/06/21. Discharged from Colleen Wright.   ? ?New?Medications Started at [facility] Discharge:?? ?None noted.  ? ?Medication Changes at [facility] Discharge: ?None noted. ? ?Medications Discontinued at [facility] Discharge: ?None noted. ? ?Medications that remain the same after [facility] Discharge:??  ?All other medications will remain the same.   ? ?Medications: ?Outpatient Encounter Medications as of 10/20/2021  ?Medication Sig  ? ALPRAZolam (XANAX) 0.5 MG tablet TAKE 1 TABLET BY MOUTH AT BEDTIME AS NEEDED FOR ANXIETY  ? apixaban (ELIQUIS) 5 MG TABS tablet Take 1 tablet (5 mg total) by mouth 2 (two) times daily.  ? atorvastatin (LIPITOR)  40 MG tablet TAKE 1 TABLET BY MOUTH EVERY DAY IN THE EVENING  ? azelastine (ASTELIN) 0.1 % nasal spray Place 1 spray into both nostrils 2 (two) times daily. Use in each nostril as directed  ? cetirizine (ZYRTEC) 10 MG tablet TAKE 1 TABLET BY MOUTH EVERY DAY  ? ELIQUIS 5 MG TABS tablet TAKE 1 TABLET BY MOUTH TWICE A DAY  ? levothyroxine (SYNTHROID) 112 MCG tablet TAKE 1 TABLET BY MOUTH EVERY DAY  ? losartan-hydrochlorothiazide (HYZAAR) 50-12.5 MG tablet Take 1 tablet by mouth daily.  ? meclizine (ANTIVERT) 25 MG tablet Take 1 tablet (25 mg total) by mouth 3 (three) times daily as needed for dizziness.  ? metoprolol succinate (TOPROL-XL) 50 MG 24 hr tablet TAKE 1 TABLET BY MOUTH EVERY DAY  ? tiZANidine (ZANAFLEX) 4 MG tablet Take 1 tablet (4 mg total) by mouth every 8 (eight) hours as needed for muscle spasms.  ? ?No facility-administered encounter medications on file as of 10/20/2021.  ? ? ?Have you had any problems recently with your health? ?Patient denied any problems with her health currently.  ? ?Have you had any problems with your pharmacy? ?Patient denied any problems with her current pharmacy.  ? ?What issues or side effects are you having with your medications? ?Patient denied any issues or side effects from her current medications.  ? ?What would you like me to pass along to Colleen Wright, CPP for them to help you with?  ?Patient did not have anything to pass along to CPP at this time.  ? ?What can we do to take care of you better? ?Patient did  not have any recommendations at this time.  ? ?Care Gaps ? ?AWV: done 04/27/21 ?Colonoscopy: done 12/03/09 ?DM Eye Exam: N/A ?DM Foot Exam: N/A ?Microalbumin: N/A ?HbgAIC: N/A ?DEXA: done 06/19/05 ?Mammogram: done 07/30/17 ? ? ?Star Rating Drugs: ?losartan-hydrochlorothiazide (HYZAAR) 50-12.5 MG - last filled 08/25/21 90 days  ?atorvastatin (LIPITOR) 40 MG tablet - last filled 09/01/21 30 days  ? ? ?Future Appointments  ?Date Time Provider [place]  ?11/17/2021  2:30 PM  Colleen Minium, MD [facility]  ? ? ?Colleen Wright, CCMA ?Clinical Pharmacist Assistant  ?[phone] ? ?

## 2021-11-17 ENCOUNTER — Ambulatory Visit: Payer: Medicare Other | Admitting: Family Medicine

## 2021-11-17 DIAGNOSIS — R102 Pelvic and perineal pain: Secondary | ICD-10-CM | POA: Diagnosis not present

## 2021-11-17 DIAGNOSIS — R3982 Chronic bladder pain: Secondary | ICD-10-CM | POA: Diagnosis not present

## 2021-11-17 DIAGNOSIS — R300 Dysuria: Secondary | ICD-10-CM | POA: Diagnosis not present

## 2021-11-26 ENCOUNTER — Other Ambulatory Visit: Payer: Self-pay | Admitting: Family Medicine

## 2021-11-26 DIAGNOSIS — I10 Essential (primary) hypertension: Secondary | ICD-10-CM

## 2021-12-20 ENCOUNTER — Other Ambulatory Visit: Payer: Self-pay | Admitting: Family Medicine

## 2022-01-19 ENCOUNTER — Encounter: Payer: Self-pay | Admitting: Family Medicine

## 2022-01-19 ENCOUNTER — Ambulatory Visit (INDEPENDENT_AMBULATORY_CARE_PROVIDER_SITE_OTHER): Payer: Medicare Other | Admitting: Family Medicine

## 2022-01-19 VITALS — BP 128/72 | HR 81 | Temp 98.6°F | Resp 16 | Ht 62.0 in | Wt 166.4 lb

## 2022-01-19 DIAGNOSIS — E890 Postprocedural hypothyroidism: Secondary | ICD-10-CM | POA: Diagnosis not present

## 2022-01-19 DIAGNOSIS — N390 Urinary tract infection, site not specified: Secondary | ICD-10-CM

## 2022-01-19 DIAGNOSIS — R5383 Other fatigue: Secondary | ICD-10-CM

## 2022-01-19 DIAGNOSIS — I6523 Occlusion and stenosis of bilateral carotid arteries: Secondary | ICD-10-CM | POA: Diagnosis not present

## 2022-01-19 DIAGNOSIS — H2513 Age-related nuclear cataract, bilateral: Secondary | ICD-10-CM | POA: Diagnosis not present

## 2022-01-19 DIAGNOSIS — R7989 Other specified abnormal findings of blood chemistry: Secondary | ICD-10-CM | POA: Diagnosis not present

## 2022-01-19 LAB — HEPATIC FUNCTION PANEL
ALT: 21 U/L (ref 0–35)
AST: 27 U/L (ref 0–37)
Albumin: 3.7 g/dL (ref 3.5–5.2)
Alkaline Phosphatase: 73 U/L (ref 39–117)
Bilirubin, Direct: 0.2 mg/dL (ref 0.0–0.3)
Total Bilirubin: 0.7 mg/dL (ref 0.2–1.2)
Total Protein: 6.8 g/dL (ref 6.0–8.3)

## 2022-01-19 LAB — POCT URINALYSIS DIPSTICK
Bilirubin, UA: NEGATIVE
Blood, UA: NEGATIVE
Glucose, UA: NEGATIVE
Ketones, UA: NEGATIVE
Leukocytes, UA: NEGATIVE
Nitrite, UA: NEGATIVE
Protein, UA: NEGATIVE
Spec Grav, UA: 1.02 (ref 1.010–1.025)
Urobilinogen, UA: 0.2 E.U./dL
pH, UA: 5 (ref 5.0–8.0)

## 2022-01-19 LAB — BASIC METABOLIC PANEL
BUN: 22 mg/dL (ref 6–23)
CO2: 27 mEq/L (ref 19–32)
Calcium: 9.2 mg/dL (ref 8.4–10.5)
Chloride: 106 mEq/L (ref 96–112)
Creatinine, Ser: 1.35 mg/dL — ABNORMAL HIGH (ref 0.40–1.20)
GFR: 36.5 mL/min — ABNORMAL LOW (ref >60.00)
Glucose, Bld: 89 mg/dL (ref 70–99)
Potassium: 3.7 mEq/L (ref 3.5–5.1)
Sodium: 141 mEq/L (ref 135–145)

## 2022-01-19 LAB — CBC WITH DIFFERENTIAL/PLATELET
Basophils Absolute: 0 10*3/uL (ref 0.0–0.1)
Basophils Relative: 0.2 % (ref 0.0–3.0)
Eosinophils Absolute: 0.2 10*3/uL (ref 0.0–0.7)
Eosinophils Relative: 3.2 % (ref 0.0–5.0)
HCT: 35.2 % — ABNORMAL LOW (ref 36.0–46.0)
Hemoglobin: 11.9 g/dL — ABNORMAL LOW (ref 12.0–15.0)
Lymphocytes Relative: 33.7 % (ref 12.0–46.0)
Lymphs Abs: 1.9 10*3/uL (ref 0.7–4.0)
MCHC: 33.8 g/dL (ref 30.0–36.0)
MCV: 89.2 fl (ref 78.0–100.0)
Monocytes Absolute: 0.5 10*3/uL (ref 0.1–1.0)
Monocytes Relative: 9.5 % (ref 3.0–12.0)
Neutro Abs: 3.1 10*3/uL (ref 1.4–7.7)
Neutrophils Relative %: 53.4 % (ref 43.0–77.0)
Platelets: 168 10*3/uL (ref 150.0–400.0)
RBC: 3.95 Mil/uL (ref 3.87–5.11)
RDW: 13.6 % (ref 11.5–15.5)
WBC: 5.8 10*3/uL (ref 4.0–10.5)

## 2022-01-19 LAB — B12 AND FOLATE PANEL
Folate: >24.2 ng/mL (ref >5.9)
Vitamin B-12: 227 pg/mL (ref 211–911)

## 2022-01-19 LAB — TSH: TSH: 5.58 u[IU]/mL — ABNORMAL HIGH (ref 0.35–5.50)

## 2022-01-19 NOTE — Patient Instructions (Addendum)
We're going to send your urine for culture to determine if we need more antibiotics Make sure you are drinking lots of water to help flush your bladder and kidneys Please let your back doctor know about the pain you are having Try a heating pad for pain relief Call with any questions or concerns Hang in there!!

## 2022-01-19 NOTE — Progress Notes (Signed)
   Subjective:    Patient ID: Colleen Wright, female    DOB: [birth year], 81 y.o.   MRN: [id number]  HPI R sided back pain- 'it hurts all the time'.  Pt reports she had a UTI last week- had increased frequency, urgency, incontinence.  + burning w/ urination.  Took leftover medication from a family member- not sure of the name.  Continues to have urinary symptoms- suprapubic discomfort but no current burning w/ urination.  + fatigue.  'it doesn't hurt to touch, it hurts inside'.   Review of Systems For ROS see HPI     Objective:   Physical Exam Vitals reviewed.  Constitutional:      General: She is not in acute distress.    Appearance: Normal appearance. She is not ill-appearing.  HENT:     Head: Normocephalic and atraumatic.  Cardiovascular:     Rate and Rhythm: Normal rate and regular rhythm.     Pulses: Normal pulses.  Pulmonary:     Effort: Pulmonary effort is normal. No respiratory distress.     Breath sounds: Normal breath sounds. No wheezing or rhonchi.  Abdominal:     General: Abdomen is flat. There is no distension.     Palpations: Abdomen is soft.     Tenderness: There is abdominal tenderness (suprapubic TTP). There is no right CVA tenderness, left CVA tenderness, guarding or rebound.  Musculoskeletal:        General: Tenderness (R lumbar paraspinal TTP) present.  Skin:    General: Skin is warm and dry.  Neurological:     General: No focal deficit present.     Mental Status: She is alert and oriented to person, place, and time.  Psychiatric:        Mood and Affect: Mood normal.        Behavior: Behavior normal.        Thought Content: Thought content normal.           Assessment & Plan:  Fatigue- new.  Unclear if this is related to her chronic back pain, possible UTI, age, a combination of those things, or other cause.  Check labs to r/o metabolic cause and treat any abnormalities if needed.

## 2022-01-20 LAB — URINE CULTURE
MICRO NUMBER:: 13483985
SPECIMEN QUALITY:: ADEQUATE

## 2022-01-21 ENCOUNTER — Telehealth: Payer: Self-pay | Admitting: Family Medicine

## 2022-01-21 MED ORDER — LEVOTHYROXINE SODIUM 125 MCG PO TABS: 125.0000 ug | ORAL_TABLET | Freq: Every day | ORAL | 3 refills | Status: DC

## 2022-01-21 NOTE — Telephone Encounter (Signed)
Patient's granddaughter called stating that patient hasn't been feeling well and had some questions regarding some abnormal levels they noticed on her labs in my chart. Her hemoglobin and creatinine are a couple they are concerned about. I let her know that it looks like Dr Birdie Riddle has not reviewed them as of yet which is why they haven't received a call with results.

## 2022-01-21 NOTE — Telephone Encounter (Signed)
Pt is waiting on results has been informed you have not yet reviewed we will call once you have been able to do so

## 2022-01-21 NOTE — Telephone Encounter (Signed)
Results were reviewed and discussed in result note

## 2022-01-22 ENCOUNTER — Telehealth: Payer: Self-pay

## 2022-01-22 NOTE — Telephone Encounter (Signed)
-----   Message from Midge Minium, MD sent at 01/21/2022  4:47 PM EDT ----- Labs look good w/ a few exceptions-  1) your TSH is again elevated.  This is likely contributing to your fatigue.  Based on this, we need to increase your Levothyroxine dose to 198mg daily and repeat your TSH level at a lab only visit in 1 month (ordered)  2) Your Creatinine is elevated to the level it was 2 yrs ago.  This usually means you are not drinking enough water.  Please make sure you are drinking throughout the day and we will repeat your level at the same lab only visit in 1 month (ordered)  3) Hemoglobin is fine as it is only 1/10th of a point out of range  Remainder of labs look great!

## 2022-01-22 NOTE — Telephone Encounter (Signed)
Advised of lab results and make lab apt repeat TSH in a month

## 2022-01-25 NOTE — Assessment & Plan Note (Signed)
Pt reports she had a UTI last week.  This was not evaluated and she self medicated w/ leftover abx from a family member (name unknown).  Continues to have suprapubic discomfort but no burning w/ urination.  Send urine for cx and determine if abx needed.  Encouraged her to have her sxs evaluated rather than self medicating.

## 2022-01-25 NOTE — Assessment & Plan Note (Signed)
Chronic problem.  May be cause of pt's fatigue.  Check labs.  Adjust meds prn

## 2022-02-23 ENCOUNTER — Other Ambulatory Visit: Payer: Self-pay | Admitting: Family Medicine

## 2022-02-23 ENCOUNTER — Other Ambulatory Visit (INDEPENDENT_AMBULATORY_CARE_PROVIDER_SITE_OTHER): Payer: Medicare Other

## 2022-02-23 DIAGNOSIS — R7989 Other specified abnormal findings of blood chemistry: Secondary | ICD-10-CM | POA: Diagnosis not present

## 2022-02-23 DIAGNOSIS — E890 Postprocedural hypothyroidism: Secondary | ICD-10-CM

## 2022-02-23 LAB — BASIC METABOLIC PANEL
BUN: 18 mg/dL (ref 6–23)
CO2: 26 mEq/L (ref 19–32)
Calcium: 9.1 mg/dL (ref 8.4–10.5)
Chloride: 103 mEq/L (ref 96–112)
Creatinine, Ser: 1.22 mg/dL — ABNORMAL HIGH (ref 0.40–1.20)
GFR: 41.19 mL/min — ABNORMAL LOW (ref >60.00)
Glucose, Bld: 152 mg/dL — ABNORMAL HIGH (ref 70–99)
Potassium: 3.4 mEq/L — ABNORMAL LOW (ref 3.5–5.1)
Sodium: 139 mEq/L (ref 135–145)

## 2022-02-23 LAB — TSH: TSH: 1.3 u[IU]/mL (ref 0.35–5.50)

## 2022-03-16 ENCOUNTER — Encounter: Payer: Self-pay | Admitting: Family Medicine

## 2022-03-19 ENCOUNTER — Other Ambulatory Visit: Payer: Self-pay | Admitting: Family Medicine

## 2022-03-20 ENCOUNTER — Telehealth: Payer: Self-pay | Admitting: Family Medicine

## 2022-03-20 NOTE — Telephone Encounter (Signed)
Just as confirmation you will not send any Rx without a visit correct?

## 2022-03-20 NOTE — Telephone Encounter (Signed)
Left pt a VM explaining that we are not able to send in a Rx without being seen in office . I advised pt on VM to call one of our other  locations to schedule  an apt or go to Urgent Care

## 2022-03-20 NOTE — Telephone Encounter (Signed)
Correct.  Pt needs an appt (virtual or in office) prior to medication being prescribed

## 2022-03-20 NOTE — Telephone Encounter (Signed)
Caller name: Colleen Wright (pt)  On DPR? :yes/no: Yes  Call back number: [phone]  Provider they see: Dr. Birdie Riddle  Reason for call: Pt calling b/c she wants a medication called in for possible UTI. Spoke with Marcille Blanco, advised of pt symptoms (pain when urinating, no blood in urine; trouble urinating as well) Advised pt that she should see a physician; pt refused efforts to schedule her at [facility] or another practice.

## 2022-04-08 ENCOUNTER — Telehealth: Payer: Self-pay | Admitting: Cardiovascular Disease

## 2022-04-08 NOTE — Telephone Encounter (Signed)
   Pre-operative Risk Assessment    Patient Name: Colleen Wright  DOB: 1941-05-13 MRN: [id number]      Request for Surgical Clearance    Procedure:   Epidural Steroid Injection L4 L5  Date of Surgery:  Clearance 04/14/22                                 Surgeon:  Dr. Lenord Carbo  Surgeon's Group or Practice Name:  [facility]  Phone number:  [phone] Fax number:  [phone]   Type of Clearance Requested:   - Pharmacy:  Hold Apixaban (Eliquis) 3 day hold   Type of Anesthesia:  None    Additional requests/questions:    Signed, April Henson   04/08/2022, 4:11 PM

## 2022-04-09 NOTE — Telephone Encounter (Signed)
Patient with diagnosis of afib on Eliquis for anticoagulation.    Procedure:  Epidural Steroid Injection L4 L5 Date of procedure: 04/14/22  CHA2DS2-VASc Score = 7   This indicates a 11.2% annual risk of stroke. The patient's score is based upon: CHF History: 0 HTN History: 1 Diabetes History: 0 Stroke History: 2 Vascular Disease History: 1 Age Score: 2 Gender Score: 1      CrCl 33 ml/min  Due to the pt history of stroke and the need to hold anticoagulation for 3 days, will defer to Dr. Sallyanne Kuster.   **This guidance is not considered finalized until pre-operative APP has relayed final recommendations.**

## 2022-04-09 NOTE — Telephone Encounter (Signed)
Agree w holding Eliquis for 3 days for the procedure.

## 2022-04-09 NOTE — Telephone Encounter (Signed)
   Name: Colleen Wright  DOB: [birth year]  MRN: [id number]   Primary Cardiologist: Sanda Klein, MD  Chart reviewed as part of pre-operative protocol coverage.   Per Dr. Sallyanne Kuster the patient is able to hold Eliquis x3 days prior to the procedure.  Please restart when medically safe to do so.  No medical clearance was requested.   I will route this recommendation to the requesting party via Epic fax function and remove from pre-op pool. Please call with questions.  Elgie Collard, PA-C 04/09/2022, 11:03 AM

## 2022-04-14 DIAGNOSIS — M5416 Radiculopathy, lumbar region: Secondary | ICD-10-CM | POA: Diagnosis not present

## 2022-04-30 ENCOUNTER — Ambulatory Visit (INDEPENDENT_AMBULATORY_CARE_PROVIDER_SITE_OTHER): Payer: Medicare Other

## 2022-04-30 VITALS — Ht 62.0 in | Wt 165.0 lb

## 2022-04-30 DIAGNOSIS — Z0000 Encounter for general adult medical examination without abnormal findings: Secondary | ICD-10-CM | POA: Diagnosis not present

## 2022-04-30 NOTE — Progress Notes (Signed)
Subjective:   Colleen Wright is a 81 y.o. female who presents for Medicare Annual (Subsequent) preventive examination.   Virtual Visit via Telephone Note  I connected with  Colleen Wright on 04/30/22 at  1:15 PM EDT by telephone and verified that I am speaking with the correct person using two identifiers.  Location: Patient: HOME  Provider: [facility]  Persons participating in the virtual visit: patient/Nurse Health Advisor   I discussed the limitations, risks, security and privacy concerns of performing an evaluation and management service by telephone and the availability of in person appointments. The patient expressed understanding and agreed to proceed.  Interactive audio and video telecommunications were attempted between this nurse and patient, however failed, due to patient having technical difficulties OR patient did not have access to video capability.  We continued and completed visit with audio only.  Some vital signs may be absent or patient reported.   Daphane Shepherd, LPN  Review of Systems     Cardiac Risk Factors include: advanced age (>92mn, >>85women);hypertension     Objective:    Today's Vitals   04/30/22 1321  Weight: 165 lb (74.8 kg)  Height: '5\' 2"'$  (1.575 m)   Body mass index is 30.18 kg/m.     04/30/2022    1:26 PM 05/31/2021    3:08 PM 05/10/2020    7:45 PM 04/03/2019    1:43 PM 04/03/2018    4:28 PM 01/24/2016    7:44 AM 12/28/2015    7:02 PM  Advanced Directives  Does Patient Have a Medical Advance Directive? Yes Yes No No No No No  Type of AParamedicof [place] will        [place]? No - copy requested        Would patient like information on creating a medical advance directive?   No - Patient declined   No - patient declined information No - patient declined information    Current Medications (verified) Outpatient Encounter Medications as of 04/30/2022  Medication Sig    ALPRAZolam (XANAX) 0.5 MG tablet TAKE 1 TABLET BY MOUTH AT BEDTIME AS NEEDED FOR ANXIETY   apixaban (ELIQUIS) 5 MG TABS tablet Take 1 tablet (5 mg total) by mouth 2 (two) times daily.   atorvastatin (LIPITOR) 40 MG tablet TAKE 1 TABLET BY MOUTH EVERY DAY IN THE EVENING   azelastine (ASTELIN) 0.1 % nasal spray Place 1 spray into both nostrils 2 (two) times daily. Use in each nostril as directed   cetirizine (ZYRTEC) 10 MG tablet TAKE 1 TABLET BY MOUTH EVERY DAY   ELIQUIS 5 MG TABS tablet TAKE 1 TABLET BY MOUTH TWICE A DAY   levothyroxine (SYNTHROID) 125 MCG tablet Take 1 tablet (125 mcg total) by mouth daily.   losartan-hydrochlorothiazide (HYZAAR) 50-12.5 MG tablet TAKE 1 TABLET BY MOUTH EVERY DAY   meclizine (ANTIVERT) 25 MG tablet Take 1 tablet (25 mg total) by mouth 3 (three) times daily as needed for dizziness.   meloxicam (MOBIC) 7.5 MG tablet Take 7.5 mg by mouth 2 (two) times daily as needed.   metoprolol succinate (TOPROL-XL) 50 MG 24 hr tablet TAKE 1 TABLET BY MOUTH EVERY DAY   tiZANidine (ZANAFLEX) 4 MG tablet Take 1 tablet (4 mg total) by mouth every 8 (eight) hours as needed for muscle spasms.   No facility-administered encounter medications on file as of 04/30/2022.    Allergies (verified) Amoxicillin   History: Past  Medical History:  Diagnosis Date   Adenomatous colon polyp 2006   Allergy    Arthritis    low back   Back pain 06/13/2011   CAD (coronary artery disease) 06/12/2011   Depression    GERD (gastroesophageal reflux disease)    Hiatal hernia    Hyperlipidemia    Hypertension    Hypothyroidism 06/12/2011   Leg fracture    Pain in joint, ankle and foot 11/02/2012   Pain of left heel 05/23/2012   Skin lesions, generalized 05/23/2012   Thyroid cancer ([place])    Ulcer    Vertigo 06/13/2011   Past Surgical History:  Procedure Laterality Date   BALLOON ANGIOPLASTY, ARTERY     CARDIAC CATHETERIZATION N/A 01/24/2016   Procedure: Left Heart Cath and Coronary  Angiography;  Surgeon: Peter M Martinique, MD;  Location: [facility];  Service: Cardiovascular;  Laterality: N/A;   EP IMPLANTABLE DEVICE N/A 09/24/2015   Procedure: Loop Recorder Insertion;  Surgeon: Sanda Klein, MD;  Location: [facility];  Service: Cardiovascular;  Laterality: N/A;   FRACTURE SURGERY     left leg, pin and 5 bolts in place in lower tibia   LESION REMOVAL     vaginal   THYROIDECTOMY, PARTIAL     twice   TUBAL LIGATION     growth removed from posterior vaginal vault   Family History  Problem Relation Age of Onset   Cancer Mother        ovary/uterus-can't remember   Stroke Mother    Heart disease Father    Heart disease Sister        bypass   Stroke Sister    Cancer Sister        LN, bone   Hypertension Sister    Heart disease Brother        bypass   Cancer Cousin        breast   Social History   Socioeconomic History   Marital status: Married    Spouse name: Not on file   Number of children: Not on file   Years of education: Not on file   Highest education level: Not on file  Occupational History   Not on file  Tobacco Use   Smoking status: Former    Packs/day: 0.25    Years: 5.00    Total pack years: 1.25    Types: Cigarettes    Quit date: 11/15/1968    Years since quitting: 53.4   Smokeless tobacco: Never  Vaping Use   Vaping Use: Never used  Substance and Sexual Activity   Alcohol use: No    Alcohol/week: 0.0 standard drinks of alcohol   Drug use: No   Sexual activity: Not Currently  Other Topics Concern   Not on file  Social History Narrative   Not on file   Social Determinants of Health   Financial Resource Strain: Low Risk  (04/30/2022)   Overall Financial Resource Strain (CARDIA)    Difficulty of Paying Living Expenses: Not hard at all  Food Insecurity: No Food Insecurity (04/30/2022)   Hunger Vital Sign    Worried About Running Out of Food in the Last Year: Never true    Ran Out of Food in the Last Year: Never true   Transportation Needs: No Transportation Needs (04/30/2022)   PRAPARE - Hydrologist (Medical): No    Lack of Transportation (Non-Medical): No  Physical Activity: Inactive (04/30/2022)   Exercise Vital  Sign    Days of Exercise per Week: 0 days    Minutes of Exercise per Session: 0 min  Stress: No Stress Concern Present (04/30/2022)   [place]    Feeling of Stress : Not at all  Social Connections: Moderately Integrated (04/30/2022)   Social Connection and Isolation Panel [NHANES]    Frequency of Communication with Friends and Family: More than three times a week    Frequency of Social Gatherings with Friends and Family: More than three times a week    Attends Religious Services: More than 4 times per year    Active Member of [employer] or Organizations: No    Attends Music therapist: Never    Marital Status: Married    Tobacco Counseling Counseling given: Not Answered   Clinical Intake:  Pre-visit preparation completed: Yes  Pain : No/denies pain     Nutritional Risks: None Diabetes: No  How often do you need to have someone help you when you read instructions, pamphlets, or other written materials from your doctor or pharmacy?: 1 - Never  Diabetic?no   Interpreter Needed?: No  Information entered by :: Jadene Pierini, LPN   Activities of Daily Living    04/30/2022    1:26 PM  In your present state of health, do you have any difficulty performing the following activities:  Hearing? 0  Vision? 0  Difficulty concentrating or making decisions? 0  Walking or climbing stairs? 0  Dressing or bathing? 0  Doing errands, shopping? 0  Preparing Food and eating ? N  Using the Toilet? N  In the past six months, have you accidently leaked urine? N  Do you have problems with loss of bowel control? N  Managing your Medications? N  Managing your Finances? N  Housekeeping or  managing your Housekeeping? N    Patient Care Team: Midge Minium, MD as PCP - General (Family Medicine) [place], Dani Gobble, MD as PCP - Cardiology (Cardiology) Sanda Klein, MD as Consulting Physician (Cardiology) Pieter Partridge, DO as Consulting Physician (Neurology) Madelin Rear, [facility] (Inactive) as Pharmacist (Pharmacist) [place], Tami Lin, [place] as Physician Assistant (Cardiology)  Indicate any recent Medical Services you may have received from other than [facility] providers in the past year (date may be approximate).     Assessment:   This is a routine wellness examination for [facility].  Hearing/Vision screen Vision Screening - Comments:: Annual eye exams wears glasses   Dietary issues and exercise activities discussed: Current Exercise Habits: The patient does not participate in regular exercise at present   Goals Addressed             This Visit's Progress    Exercise 3x per week (30 min per time)         Depression Screen    04/30/2022    1:24 PM 01/19/2022   11:26 AM 06/06/2021   12:45 PM 05/16/2021   11:52 AM 06/13/2020    1:08 PM 05/14/2020    1:35 PM 03/07/2020    1:06 PM  PHQ 2/9 Scores  PHQ - 2 Score 0 0 0 0 0 0 0  PHQ- 9 Score  0 0 4 0 0     Fall Risk    04/30/2022    1:22 PM 01/19/2022   11:26 AM 06/06/2021   12:44 PM 05/16/2021   11:52 AM 02/07/2021   10:19 AM  Fall Risk   Falls in the past year? 0  $'1 1 1 1  'P$ Number falls in past yr: 0 0 0 0 1  Injury with Fall? 0 0 1 0 1  Risk for fall due to : No Fall Risks History of fall(s) No Fall Risks;History of fall(s) History of fall(s)   Follow up Falls prevention discussed Falls evaluation completed Falls evaluation completed Falls evaluation completed Falls evaluation completed    [place]:  Any stairs in or around the home? No  If so, are there any without handrails? No  Home free of loose throw rugs in walkways, pet beds, electrical cords, etc? Yes  Adequate lighting  in your home to reduce risk of falls? Yes   ASSISTIVE DEVICES UTILIZED TO PREVENT FALLS:  Life alert? No  Use of a cane, walker or w/c? No  Grab bars in the bathroom? No  Shower chair or bench in shower? Yes  Elevated toilet seat or a handicapped toilet? No   Immunizations Immunization History  Administered Date(s) Administered   Fluad Quad(high Dose 65+) 05/01/2019, 06/13/2020, 06/19/2021   Influenza Whole 05/15/2011   Influenza, High Dose Seasonal PF 04/14/2018   Influenza,inj,Quad PF,6+ Mos 04/15/2014, 06/21/2015, 07/05/2016, 06/04/2017   Pneumococcal Conjugate-13 06/21/2015   Pneumococcal Polysaccharide-23 09/25/2013    TDAP status: Up to date  Flu Vaccine status: Due, Education has been provided regarding the importance of this vaccine. Advised may receive this vaccine at local pharmacy or Health Dept. Aware to provide a copy of the vaccination record if obtained from local pharmacy or Health Dept. Verbalized acceptance and understanding.  Pneumococcal vaccine status: Up to date  Covid-19 vaccine status: Completed vaccines  Qualifies for Shingles Vaccine? Yes   Zostavax completed No   Shingrix Completed?: No.    Education has been provided regarding the importance of this vaccine. Patient has been advised to call insurance company to determine out of pocket expense if they have not yet received this vaccine. Advised may also receive vaccine at local pharmacy or Health Dept. Verbalized acceptance and understanding.  Screening Tests Health Maintenance  Topic Date Due   INFLUENZA VACCINE  03/17/2022   TETANUS/TDAP  05/10/2022   Pneumonia Vaccine 51+ Years old  Completed   DEXA SCAN  Completed   HPV VACCINES  Aged Out   COVID-19 Vaccine  Discontinued   Zoster Vaccines- Shingrix  Discontinued    Health Maintenance  Health Maintenance Due  Topic Date Due   INFLUENZA VACCINE  03/17/2022    Colorectal cancer screening: No longer required.   Mammogram status: No  longer required due to age.  Bone Density status: Ordered declined by patient . Pt provided with contact info and advised to call to schedule appt.  Lung Cancer Screening: (Low Dose CT Chest recommended if Age 71-80 years, 30 pack-year currently smoking OR have quit w/in 15years.) does not qualify.   Lung Cancer Screening Referral: n/a  Additional Screening:  Hepatitis C Screening: does not qualify;   Vision Screening: Recommended annual ophthalmology exams for early detection of glaucoma and other disorders of the eye. Is the patient up to date with their annual eye exam?  Yes  Who is the provider or what is the name of the office in which the patient attends annual eye exams? [facility]  If pt is not established with a provider, would they like to be referred to a provider to establish care? No .   Dental Screening: Recommended annual dental exams for proper oral hygiene  Community  Resource Referral / Chronic Care Management: CRR required this visit?  No   CCM required this visit?  No      Plan:     I have personally reviewed and noted the following in the patient's chart:   Medical and social history Use of alcohol, tobacco or illicit drugs  Current medications and supplements including opioid prescriptions. Patient is not currently taking opioid prescriptions. Functional ability and status Nutritional status Physical activity Advanced directives List of other physicians Hospitalizations, surgeries, and ER visits in previous 12 months Vitals Screenings to include cognitive, depression, and falls Referrals and appointments  In addition, I have reviewed and discussed with patient certain preventive protocols, quality metrics, and best practice recommendations. A written personalized care plan for preventive services as well as general preventive health recommendations were provided to patient.     Daphane Shepherd, LPN   11/24/7351   Nurse Notes: due flu  /TDAP vaccine

## 2022-04-30 NOTE — Patient Instructions (Signed)
[name] , Thank you for taking time to come for your Medicare Wellness Visit. I appreciate your ongoing commitment to your health goals. Please review the following plan we discussed and let me know if I can assist you in the future.   Screening recommendations/referrals: Colonoscopy: no longer required  Mammogram: no longer required  Bone Density: patient declined  Recommended yearly ophthalmology/optometry visit for glaucoma screening and checkup Recommended yearly dental visit for hygiene and checkup  Vaccinations: Influenza vaccine: due in fall  Pneumococcal vaccine: completed  Tdap vaccine: due  Shingles vaccine: will consider    Covid-19:completed   Advanced directives: Please bring a copy of your health care power of attorney and living will to the office to be added to your chart at your convenience.   Conditions/risks identified: Aim for 30 minutes of exercise or brisk walking, 6-8 glasses of water, and 5 servings of fruits and vegetables each day.   Next appointment: Follow up in one year for your annual wellness visit    Preventive Care 65 Years and Older, Female Preventive care refers to lifestyle choices and visits with your health care provider that can promote health and wellness. What does preventive care include? A yearly physical exam. This is also called an annual well check. Dental exams once or twice a year. Routine eye exams. Ask your health care provider how often you should have your eyes checked. Personal lifestyle choices, including: Daily care of your teeth and gums. Regular physical activity. Eating a healthy diet. Avoiding tobacco and drug use. Limiting alcohol use. Practicing safe sex. Taking low-dose aspirin every day. Taking vitamin and mineral supplements as recommended by your health care provider. What happens during an annual well check? The services and screenings done by your health care provider during your annual well check will  depend on your age, overall health, lifestyle risk factors, and family history of disease. Counseling  Your health care provider may ask you questions about your: Alcohol use. Tobacco use. Drug use. Emotional well-being. Home and relationship well-being. Sexual activity. Eating habits. History of falls. Memory and ability to understand (cognition). Work and work Statistician. Reproductive health. Screening  You may have the following tests or measurements: Height, weight, and BMI. Blood pressure. Lipid and cholesterol levels. These may be checked every 5 years, or more frequently if you are over 45 years old. Skin check. Lung cancer screening. You may have this screening every year starting at age 35 if you have a 30-pack-year history of smoking and currently smoke or have quit within the past 15 years. Fecal occult blood test (FOBT) of the stool. You may have this test every year starting at age 2. Flexible sigmoidoscopy or colonoscopy. You may have a sigmoidoscopy every 5 years or a colonoscopy every 10 years starting at age 16. Hepatitis C blood test. Hepatitis B blood test. Sexually transmitted disease (STD) testing. Diabetes screening. This is done by checking your blood sugar (glucose) after you have not eaten for a while (fasting). You may have this done every 1-3 years. Bone density scan. This is done to screen for osteoporosis. You may have this done starting at age 62. Mammogram. This may be done every 1-2 years. Talk to your health care provider about how often you should have regular mammograms. Talk with your health care provider about your test results, treatment options, and if necessary, the need for more tests. Vaccines  Your health care provider may recommend certain vaccines, such as: Influenza vaccine. This is  recommended every year. Tetanus, diphtheria, and acellular pertussis (Tdap, Td) vaccine. You may need a Td booster every 10 years. Zoster vaccine. You may  need this after age 74. Pneumococcal 13-valent conjugate (PCV13) vaccine. One dose is recommended after age 16. Pneumococcal polysaccharide (PPSV23) vaccine. One dose is recommended after age 50. Talk to your health care provider about which screenings and vaccines you need and how often you need them. This information is not intended to replace advice given to you by your health care provider. Make sure you discuss any questions you have with your health care provider. Document Released: 08/30/2015 Document Revised: 04/22/2016 Document Reviewed: 06/04/2015 Elsevier Interactive Patient Education  2017 [employer] Prevention in the Home Falls can cause injuries. They can happen to people of all ages. There are many things you can do to make your home safe and to help prevent falls. What can I do on the outside of my home? Regularly fix the edges of walkways and driveways and fix any cracks. Remove anything that might make you trip as you walk through a door, such as a raised step or threshold. Trim any bushes or trees on the path to your home. Use bright outdoor lighting. Clear any walking paths of anything that might make someone trip, such as rocks or tools. Regularly check to see if handrails are loose or broken. Make sure that both sides of any steps have handrails. Any raised decks and porches should have guardrails on the edges. Have any leaves, snow, or ice cleared regularly. Use sand or salt on walking paths during winter. Clean up any spills in your garage right away. This includes oil or grease spills. What can I do in the bathroom? Use night lights. Install grab bars by the toilet and in the tub and shower. Do not use towel bars as grab bars. Use non-skid mats or decals in the tub or shower. If you need to sit down in the shower, use a plastic, non-slip stool. Keep the floor dry. Clean up any water that spills on the floor as soon as it happens. Remove soap buildup in  the tub or shower regularly. Attach bath mats securely with double-sided non-slip rug tape. Do not have throw rugs and other things on the floor that can make you trip. What can I do in the bedroom? Use night lights. Make sure that you have a light by your bed that is easy to reach. Do not use any sheets or blankets that are too big for your bed. They should not hang down onto the floor. Have a firm chair that has side arms. You can use this for support while you get dressed. Do not have throw rugs and other things on the floor that can make you trip. What can I do in the kitchen? Clean up any spills right away. Avoid walking on wet floors. Keep items that you use a lot in easy-to-reach places. If you need to reach something above you, use a strong step stool that has a grab bar. Keep electrical cords out of the way. Do not use floor polish or wax that makes floors slippery. If you must use wax, use non-skid floor wax. Do not have throw rugs and other things on the floor that can make you trip. What can I do with my stairs? Do not leave any items on the stairs. Make sure that there are handrails on both sides of the stairs and use them. Fix handrails that are  broken or loose. Make sure that handrails are as long as the stairways. Check any carpeting to make sure that it is firmly attached to the stairs. Fix any carpet that is loose or worn. Avoid having throw rugs at the top or bottom of the stairs. If you do have throw rugs, attach them to the floor with carpet tape. Make sure that you have a light switch at the top of the stairs and the bottom of the stairs. If you do not have them, ask someone to add them for you. What else can I do to help prevent falls? Wear shoes that: Do not have high heels. Have rubber bottoms. Are comfortable and fit you well. Are closed at the toe. Do not wear sandals. If you use a stepladder: Make sure that it is fully opened. Do not climb a closed  stepladder. Make sure that both sides of the stepladder are locked into place. Ask someone to hold it for you, if possible. Clearly mark and make sure that you can see: Any grab bars or handrails. First and last steps. Where the edge of each step is. Use tools that help you move around (mobility aids) if they are needed. These include: Canes. Walkers. Scooters. Crutches. Turn on the lights when you go into a dark area. Replace any light bulbs as soon as they burn out. Set up your furniture so you have a clear path. Avoid moving your furniture around. If any of your floors are uneven, fix them. If there are any pets around you, be aware of where they are. Review your medicines with your doctor. Some medicines can make you feel dizzy. This can increase your chance of falling. Ask your doctor what other things that you can do to help prevent falls. This information is not intended to replace advice given to you by your health care provider. Make sure you discuss any questions you have with your health care provider. Document Released: 05/30/2009 Document Revised: 01/09/2016 Document Reviewed: 09/07/2014 Elsevier Interactive Patient Education  2017 [employer].

## 2022-05-13 DIAGNOSIS — Z6830 Body mass index (BMI) 30.0-30.9, adult: Secondary | ICD-10-CM | POA: Diagnosis not present

## 2022-05-13 DIAGNOSIS — M47816 Spondylosis without myelopathy or radiculopathy, lumbar region: Secondary | ICD-10-CM | POA: Diagnosis not present

## 2022-05-18 ENCOUNTER — Telehealth: Payer: Self-pay | Admitting: Pharmacist

## 2022-05-18 NOTE — Progress Notes (Signed)
Chronic Care Management Pharmacy Assistant   Name: Colleen Wright  MRN: [id number] DOB: [birth year]   Reason for Encounter: Disease State - Hypertension Call      Recent office visits:  04/30/22 Annual Medicare Wellness Visit Completed   01/19/22 [name], MD - Family Medicine - Recurrent UTI - Labs were ordered. levothyroxine (SYNTHROID) 125 MCG tablet prescribed. Follow up as needed.   Recent consult visits:  None noted.   Hospital visits:  None in previous 6 months  Medications: Outpatient Encounter Medications as of 05/18/2022  Medication Sig Note   ALPRAZolam (XANAX) 0.5 MG tablet TAKE 1 TABLET BY MOUTH AT BEDTIME AS NEEDED FOR ANXIETY    apixaban (ELIQUIS) 5 MG TABS tablet Take 1 tablet (5 mg total) by mouth 2 (two) times daily.    atorvastatin (LIPITOR) 40 MG tablet TAKE 1 TABLET BY MOUTH EVERY DAY IN THE EVENING    azelastine (ASTELIN) 0.1 % nasal spray Place 1 spray into both nostrils 2 (two) times daily. Use in each nostril as directed    cetirizine (ZYRTEC) 10 MG tablet TAKE 1 TABLET BY MOUTH EVERY DAY    ELIQUIS 5 MG TABS tablet TAKE 1 TABLET BY MOUTH TWICE A DAY    levothyroxine (SYNTHROID) 125 MCG tablet Take 1 tablet (125 mcg total) by mouth daily.    losartan-hydrochlorothiazide (HYZAAR) 50-12.5 MG tablet TAKE 1 TABLET BY MOUTH EVERY DAY    meclizine (ANTIVERT) 25 MG tablet Take 1 tablet (25 mg total) by mouth 3 (three) times daily as needed for dizziness. 01/19/2022: PRN   meloxicam (MOBIC) 7.5 MG tablet Take 7.5 mg by mouth 2 (two) times daily as needed.    metoprolol succinate (TOPROL-XL) 50 MG 24 hr tablet TAKE 1 TABLET BY MOUTH EVERY DAY    tiZANidine (ZANAFLEX) 4 MG tablet Take 1 tablet (4 mg total) by mouth every 8 (eight) hours as needed for muscle spasms.    No facility-administered encounter medications on file as of 05/18/2022.   Current antihypertensive regimen:  metoprolol succinate (TOPROL-XL) 50 MG 24 hr tablet losartan-hydrochlorothiazide  (HYZAAR) 50-12.5 MG tablet  How often are you checking your Blood Pressure?  Patient reported checking blood pressures when she is at the doctor and occasionally at home.   Current home BP readings: 120/70 (average reported by patient)    What recent interventions/DTPs have been made by any provider to improve Blood Pressure control since last CPP Visit:  Patient denied any recent medication changes since last visit with CPP    Any recent hospitalizations or ED visits since last visit with CPP? Patient has not had any hospitalizations or ED visits since last visit with CPP    What diet changes have been made to improve Blood Pressure Control?  Patient is on a low sodium diet.    What exercise is being done to improve your Blood Pressure Control?   Patient reports she remains as active as possible and she does daily housework.    Adherence Review: Is the patient currently on ACE/ARB medication? Yes Does the patient have >5 day gap between last estimated fill dates? No    Care Gaps   AWV: done 04/27/21 Colonoscopy: done 12/03/09 DM Eye Exam: N/A DM Foot Exam: N/A Microalbumin: N/A HbgAIC: N/A DEXA: done 06/19/05 Mammogram: done 07/30/17     Star Rating Drugs: losartan-hydrochlorothiazide (HYZAAR) 50-12.5 MG - last filled 02/25/22 90 days  atorvastatin (LIPITOR) 40 MG tablet - last filled 04/27/22 30 days  Future Appointments  Date Time Provider [place]  05/13/2023  1:00 PM [facility] [place] Clinical Pharmacist Assistant  [phone]

## 2022-05-25 ENCOUNTER — Other Ambulatory Visit: Payer: Self-pay | Admitting: Family Medicine

## 2022-05-25 DIAGNOSIS — I10 Essential (primary) hypertension: Secondary | ICD-10-CM

## 2022-05-27 ENCOUNTER — Other Ambulatory Visit: Payer: Self-pay

## 2022-05-27 MED ORDER — APIXABAN 5 MG PO TABS: 5.0000 mg | ORAL_TABLET | Freq: Two times a day (BID) | ORAL | 1 refills | Status: DC

## 2022-05-27 NOTE — Telephone Encounter (Signed)
Received faxed refill request for Eliquis from [id number].  Pt last saw [name], [place] on 09/11/21, last labs 02/23/22 Creat 1.22, age 81, weight 74.8kg, based on specified criteria pt is on appropriate dosage of Eliquis '5mg'$  BID for afib.  Will refill rx.

## 2022-07-23 ENCOUNTER — Other Ambulatory Visit: Payer: Self-pay | Admitting: Family Medicine

## 2022-08-21 DIAGNOSIS — Z23 Encounter for immunization: Secondary | ICD-10-CM | POA: Diagnosis not present

## 2022-08-27 ENCOUNTER — Other Ambulatory Visit: Payer: Self-pay | Admitting: Family Medicine

## 2022-08-27 DIAGNOSIS — J310 Chronic rhinitis: Secondary | ICD-10-CM

## 2022-09-23 ENCOUNTER — Telehealth: Payer: Self-pay | Admitting: Physician Assistant

## 2022-09-23 NOTE — Telephone Encounter (Signed)
Returned call to patient. Informed her that we do have samples for her to pick up and that I will also leave another application with the samples to please return it filled out with all the needed information and/or papers so that our office can fax to the foundation as soon as possible. I also asked if she received the application that was mailed to her last year when we spoke she stated that she got it and that she did not fill it out due to her making to much and that her PCP's office told her to reach out to our office to change the tier of the Rx. I informed her that do not have anything to do with the tier for the medication that she would have to contact her insurance provider about the tier cost. Patient's daughter in law will pick up samples and application for patient.

## 2022-09-23 NOTE — Telephone Encounter (Signed)
Pt c/o medication issue:  1. Name of Medication: Eliquis  2. How are you currently taking this medication (dosage and times per day)?   3. Are you having a reaction (difficulty breathing--STAT)?   4. What is your medication issue?  She said her insurance said to tell her doctor to see if he could get it another tier, or change it to something less expensive.. They told her to tell you to call this phone number [phone] about changing the tier

## 2022-09-23 NOTE — Telephone Encounter (Signed)
Patient called in, seems she has had issues with cost for Eliquis before. February last year- patient assistance was sent in for patient, patient states she is unaware if she got this before? I advised I would check with CMA that completed last year to see if she was aware of any update, or if this was denied.   Patient does have a few pills left, I will check for samples.   Johnston Ebbs,  I know its been a while, but do you remember if this was denied for any reason? I didn't see anything in the chart other than it was sent off. Just wanted to check before retrying patient assistance.   Thanks!

## 2022-10-16 ENCOUNTER — Other Ambulatory Visit: Payer: Self-pay | Admitting: Family Medicine

## 2022-10-16 DIAGNOSIS — I10 Essential (primary) hypertension: Secondary | ICD-10-CM

## 2022-11-30 ENCOUNTER — Other Ambulatory Visit: Payer: Self-pay | Admitting: Family Medicine

## 2022-12-06 NOTE — Progress Notes (Unsigned)
Cardiology Office Note:    Date:  12/07/2022   ID:  Colleen Wright, DOB 06-24-1941, MRN [id number]  PCP:  Sheliah Hatch, MD    [facility] Providers Cardiologist:  Thurmon Fair, MD Cardiology APP:  Marcelino Duster, PA { Referring MD: Sheliah Hatch, MD   Chief Complaint  Patient presents with   Follow-up    PAF    History of Present Illness:    Colleen Wright is a 81 y.o. female with a hx of paroxysmal atrial fibrillation on chronic anticoagulation, cryptogenic stroke, hypertension, CAD, carotid artery stenosis, and hyperlipidemia.  She suffered a stroke which led to implantable loop recorder which detected asymptomatic atrial fibrillation.  She is anticoagulated with Eliquis 5 mg twice daily.  Hypertension is controlled with 50 mg Toprol and losartan-HCTZ.  Hyperlipidemia is controlled with 40 mg atorvastatin.  Carotid artery duplex in December 2020 showed 50 to 69% stenosis in the left ICA.  Loop recorder has since reached end of service.  However, while functional the ILR showed relatively low A. fib burden.  Left heart catheterization in June 2017 showed moderate to severe RCA branch stenosis that was treated with medical therapy due to tortuosity of the proximal vessel.  She was hospitalized 04/2020 with vertigo and falls. She was unable to care for herself at home.  I saw her in clinic 05/05/21 and she reported an episode at a restaurant in which she could not speak for 10 min but did not seek help. She also suspected her cholesterol medicine was causing her dizziness, but was not interested in changing medications (e.g., adding zetia) at that time. She also reported only taking eliquis once per day due to cost. I provided patient assistance.  She reported atypical chest pain but did not want to pursue ischemic evaluation at that time.  I saw her for routine follow-up 09/11/2021 and again provided patient assistance for Eliquis so that she can take this twice  daily.  We also discussed Coumadin.  I did discuss with Pharm.D. and she would qualify for free Eliquis after she has spent 3% of), which includes her husband's income and medication costs.  She presents today for routine follow-up. She reports low energy for the last 6 months. She also reports chest tingling/burning sensation every day that lasts at least 5 minutes, occurs with rest and exertion. Does not sound like its exertional, she also gets it when sitting in her chair. She thinks her cholesterol medicine is making her feel poorly.   Past Medical History:  Diagnosis Date   Adenomatous colon polyp 2006   Allergy    Arthritis    low back   Back pain 06/13/2011   CAD (coronary artery disease) 06/12/2011   Depression    GERD (gastroesophageal reflux disease)    Hiatal hernia    Hyperlipidemia    Hypertension    Hypothyroidism 06/12/2011   Leg fracture    Pain in joint, ankle and foot 11/02/2012   Pain of left heel 05/23/2012   Skin lesions, generalized 05/23/2012   Thyroid cancer    Ulcer    Vertigo 06/13/2011    Past Surgical History:  Procedure Laterality Date   BALLOON ANGIOPLASTY, ARTERY     CARDIAC CATHETERIZATION N/A 01/24/2016   Procedure: Left Heart Cath and Coronary Angiography;  Surgeon: Peter M Swaziland, MD;  Location: [facility] INVASIVE CV LAB;  Service: Cardiovascular;  Laterality: N/A;   EP IMPLANTABLE DEVICE N/A 09/24/2015   Procedure: Loop Recorder Insertion;  Surgeon: Thurmon Fair, MD;  Location: [facility] INVASIVE CV LAB;  Service: Cardiovascular;  Laterality: N/A;   FRACTURE SURGERY     left leg, pin and 5 bolts in place in lower tibia   LESION REMOVAL     vaginal   THYROIDECTOMY, PARTIAL     twice   TUBAL LIGATION     growth removed from posterior vaginal vault    Current Medications: Current Meds  Medication Sig   ALPRAZolam (XANAX) 0.5 MG tablet TAKE 1 TABLET BY MOUTH AT BEDTIME AS NEEDED FOR ANXIETY   apixaban (ELIQUIS) 5 MG TABS tablet Take 1 tablet (5 mg total)  by mouth 2 (two) times daily.   azelastine (ASTELIN) 0.1 % nasal spray Place 1 spray into both nostrils 2 (two) times daily. Use in each nostril as directed   cetirizine (ZYRTEC) 10 MG tablet TAKE 1 TABLET BY MOUTH EVERY DAY   levothyroxine (SYNTHROID) 125 MCG tablet TAKE 1 TABLET BY MOUTH EVERY DAY   losartan-hydrochlorothiazide (HYZAAR) 50-12.5 MG tablet TAKE 1 TABLET BY MOUTH EVERY DAY   meclizine (ANTIVERT) 25 MG tablet Take 1 tablet (25 mg total) by mouth 3 (three) times daily as needed for dizziness.   meloxicam (MOBIC) 7.5 MG tablet Take 7.5 mg by mouth 2 (two) times daily as needed.   metoprolol succinate (TOPROL-XL) 50 MG 24 hr tablet TAKE 1 TABLET BY MOUTH EVERY DAY   tiZANidine (ZANAFLEX) 4 MG tablet Take 1 tablet (4 mg total) by mouth every 8 (eight) hours as needed for muscle spasms.   [DISCONTINUED] atorvastatin (LIPITOR) 40 MG tablet TAKE 1 TABLET BY MOUTH EVERY DAY IN THE EVENING     Allergies:   Amoxicillin   Social History   Socioeconomic History   Marital status: Married    Spouse name: Not on file   Number of children: Not on file   Years of education: Not on file   Highest education level: Not on file  Occupational History   Not on file  Tobacco Use   Smoking status: Former    Packs/day: 0.25    Years: 5.00    Additional pack years: 0.00    Total pack years: 1.25    Types: Cigarettes    Quit date: 11/15/1968    Years since quitting: 54.0   Smokeless tobacco: Never  Vaping Use   Vaping Use: Never used  Substance and Sexual Activity   Alcohol use: No    Alcohol/week: 0.0 standard drinks of alcohol   Drug use: No   Sexual activity: Not Currently  Other Topics Concern   Not on file  Social History Narrative   Not on file   Social Determinants of Health   Financial Resource Strain: Low Risk  (04/30/2022)   Overall Financial Resource Strain (CARDIA)    Difficulty of Paying Living Expenses: Not hard at all  Food Insecurity: No Food Insecurity (04/30/2022)    Hunger Vital Sign    Worried About Running Out of Food in the Last Year: Never true    Ran Out of Food in the Last Year: Never true  Transportation Needs: No Transportation Needs (04/30/2022)   PRAPARE - Administrator, Civil Service (Medical): No    Lack of Transportation (Non-Medical): No  Physical Activity: Inactive (04/30/2022)   Exercise Vital Sign    Days of Exercise per Week: 0 days    Minutes of Exercise per Session: 0 min  Stress: No Stress Concern Present (04/30/2022)   [employer] of  Occupational Health - Occupational Stress Questionnaire    Feeling of Stress : Not at all  Social Connections: Moderately Integrated (04/30/2022)   Social Connection and Isolation Panel [NHANES]    Frequency of Communication with Friends and Family: More than three times a week    Frequency of Social Gatherings with Friends and Family: More than three times a week    Attends Religious Services: More than 4 times per year    Active Member of [employer] or Organizations: No    Attends Banker Meetings: Never    Marital Status: Married     Family History: The patient's family history includes Cancer in her cousin, mother, and sister; Heart disease in her brother, father, and sister; Hypertension in her sister; Stroke in her mother and sister.  ROS:   Please see the history of present illness.     All other systems reviewed and are negative.  EKGs/Labs/Other Studies Reviewed:    The following studies were reviewed today:  Carotid US 2022 Summary:  Right Carotid: Velocities in the right ICA are consistent with a 1-39%  stenosis.                Non-hemodynamically significant plaque <50% noted in the  CCA.                Essentially stable RICA velocities.   Left Carotid: Velocities in the left ICA are consistent with a 40-59%  stenosis.               Non-hemodynamically significant plaque <50% noted in the  CCA.   EKG:  EKG is  ordered today.  The ekg ordered  today demonstrates sinus bradycardia HR 56  Recent Labs: 01/19/2022: ALT 21; Hemoglobin 11.9; Platelets 168.0 02/23/2022: BUN 18; Creatinine, Ser 1.22; Potassium 3.4; Sodium 139; TSH 1.30  Recent Lipid Panel    Component Value Date/Time   CHOL 138 05/16/2021 1220   TRIG 125.0 05/16/2021 1220   HDL 45.00 05/16/2021 1220   CHOLHDL 3 05/16/2021 1220   VLDL 25.0 05/16/2021 1220   LDLCALC 68 05/16/2021 1220     Risk Assessment/Calculations:    CHA2DS2-VASc Score = 7   This indicates a 11.2% annual risk of stroke. The patient's score is based upon: CHF History: 0 HTN History: 1 Diabetes History: 0 Stroke History: 2 Vascular Disease History: 1 Age Score: 2 Gender Score: 1        Physical Exam:    VS:  BP 122/68   Pulse (!) 56   Ht 5\' 2"  (1.575 m)   Wt 165 lb 3.2 oz (74.9 kg)   SpO2 96%   BMI 30.22 kg/m     Wt Readings from Last 3 Encounters:  12/07/22 165 lb 3.2 oz (74.9 kg)  04/30/22 165 lb (74.8 kg)  01/19/22 166 lb 6 oz (75.5 kg)     GEN:  Well nourished, well developed in no acute distress HEENT: Normal NECK: No JVD; No carotid bruits LYMPHATICS: No lymphadenopathy CARDIAC: RRR, no murmurs, rubs, gallops RESPIRATORY:  Clear to auscultation without rales, wheezing or rhonchi  ABDOMEN: Soft, non-tender, non-distended MUSCULOSKELETAL:  No edema; No deformity  SKIN: Warm and dry NEUROLOGIC:  Alert and oriented x 3 PSYCHIATRIC:  Normal affect   ASSESSMENT:    1. Other fatigue   2. Lack of motivation   3. Malodorous urine   4. Paroxysmal atrial fibrillation   5. Chronic anticoagulation   6. TIA (transient ischemic attack)   7. Cryptogenic  stroke   8. Coronary artery disease of native artery of native heart with stable angina pectoris   9. Hyperlipidemia with target LDL less than 70   10. Bilateral carotid artery stenosis    PLAN:    In order of problems listed above:  Fatigue Low energy Low motivation Still struggling with sister's death 1 year  ago Will try statin holiday for 3 weeks then switch to 20 mg crestor Check thyroid panel Question if her baseline bradycardia is becoming symptomatic - will split her 50 mg toprol to 25 mg BID   Malodorous urine UA with reflex BMP today   PAF Chronic anticoagulation Hx of CVA Possible TIA ?04/2021 This patients CHA2DS2-VASc Score and unadjusted Ischemic Stroke Rate (% per year) is equal to 11.2 % stroke rate/year from a score of 7 (1age, female, HTN, CAD, CVA2) - anticoagulated with eliquis - still taking once daily - correct dose with weight 67 kg, no recent labs   CAD - treated medically - no ASA in the setting of eliquis - continue BB and statin   Hyperlipidemia - LDL 74 on 11/2019 - continue statin, she did not want to try to add zetia in an attempt to reduce dose of satin 05/16/2021: Cholesterol 138; HDL 45.00; LDL Cholesterol 68; Triglycerides 125.0; VLDL 25.0  Typically would be due for an updated lipid panel, would consider this in light of her medically managed CAD and CVA - will try statin holiday as above   Carotid artery stenosis - last duplex 07/2019 - given possible TIA in 04/2021, I repeated carotid dopplers which showed 1-39% stenosis in the right ICA and 40-59% stenosis in the left ICA, unchanged from 2020 study - continue risk factor management - will repeat this study    Medication Adjustments/Labs and Tests Ordered: Current medicines are reviewed at length with the patient today.  Concerns regarding medicines are outlined above.  No orders of the defined types were placed in this encounter.  No orders of the defined types were placed in this encounter.   Patient Instructions  Medication Instructions:  STOP atorvastatin (Lipitor)  In 3 weeks-START rosuvastatin (Crestor) 20 mg daily CHANGE metoprolol succinate (Toprol XL) 25 mg two times daily  *If you need a refill on your cardiac medications before your next appointment, please call your  pharmacy*  Lab Work: BMET, UA, Thyroid panel today  If you have labs (blood work) drawn today and your tests are completely normal, you will receive your results only by: MyChart Message (if you have MyChart) OR A paper copy in the mail If you have any lab test that is abnormal or we need to change your treatment, we will call you to review the results.  Testing: Your physician has requested that you have a carotid duplex (September 2024). This test is an ultrasound of the carotid arteries in your neck. It looks at blood flow through these arteries that supply the brain with blood. Allow one hour for this exam. There are no restrictions or special instructions.  Follow-Up: At [facility], you and your health needs are our priority.  As part of our continuing mission to provide you with exceptional heart care, we have created designated Provider Care Teams.  These Care Teams include your primary Cardiologist (physician) and Advanced Practice Providers (APPs -  Physician Assistants and Nurse Practitioners) who all work together to provide you with the care you need, when you need it.  We recommend signing up for the patient portal  called "MyChart".  Sign up information is provided on this After Visit Summary.  MyChart is used to connect with patients for Virtual Visits (Telemedicine).  Patients are able to view lab/test results, encounter notes, upcoming appointments, etc.  Non-urgent messages can be sent to your provider as well.   To learn more about what you can do with MyChart, go to [email or web address].    Your next appointment:   3-4 month(s)  Provider:   Thurmon Fair, MD        Signed, Roe Rutherford Tara Rud, [place]  12/07/2022 2:29 PM    [facility]

## 2022-12-07 ENCOUNTER — Encounter: Payer: Self-pay | Admitting: Physician Assistant

## 2022-12-07 ENCOUNTER — Ambulatory Visit: Payer: Medicare Other | Attending: Physician Assistant | Admitting: Physician Assistant

## 2022-12-07 VITALS — BP 122/68 | HR 56 | Ht 62.0 in | Wt 165.2 lb

## 2022-12-07 DIAGNOSIS — Z7901 Long term (current) use of anticoagulants: Secondary | ICD-10-CM | POA: Diagnosis not present

## 2022-12-07 DIAGNOSIS — R5383 Other fatigue: Secondary | ICD-10-CM | POA: Diagnosis not present

## 2022-12-07 DIAGNOSIS — I480 Paroxysmal atrial fibrillation: Secondary | ICD-10-CM | POA: Insufficient documentation

## 2022-12-07 DIAGNOSIS — Z9189 Other specified personal risk factors, not elsewhere classified: Secondary | ICD-10-CM | POA: Insufficient documentation

## 2022-12-07 DIAGNOSIS — I6523 Occlusion and stenosis of bilateral carotid arteries: Secondary | ICD-10-CM | POA: Insufficient documentation

## 2022-12-07 DIAGNOSIS — G459 Transient cerebral ischemic attack, unspecified: Secondary | ICD-10-CM

## 2022-12-07 DIAGNOSIS — E785 Hyperlipidemia, unspecified: Secondary | ICD-10-CM | POA: Insufficient documentation

## 2022-12-07 DIAGNOSIS — I639 Cerebral infarction, unspecified: Secondary | ICD-10-CM | POA: Diagnosis not present

## 2022-12-07 DIAGNOSIS — R8290 Unspecified abnormal findings in urine: Secondary | ICD-10-CM | POA: Insufficient documentation

## 2022-12-07 DIAGNOSIS — I25118 Atherosclerotic heart disease of native coronary artery with other forms of angina pectoris: Secondary | ICD-10-CM | POA: Diagnosis not present

## 2022-12-07 MED ORDER — METOPROLOL SUCCINATE ER 50 MG PO TB24: 25.0000 mg | ORAL_TABLET | Freq: Two times a day (BID) | ORAL | 3 refills | Status: DC

## 2022-12-07 MED ORDER — ROSUVASTATIN CALCIUM 20 MG PO TABS: 20.0000 mg | ORAL_TABLET | Freq: Every day | ORAL | 3 refills | Status: DC

## 2022-12-07 MED ORDER — NITROGLYCERIN 0.4 MG SL SUBL: 0.4000 mg | SUBLINGUAL_TABLET | SUBLINGUAL | 3 refills | Status: AC | PRN

## 2022-12-07 NOTE — Patient Instructions (Addendum)
Medication Instructions:  STOP atorvastatin (Lipitor)  In 3 weeks-START rosuvastatin (Crestor) 20 mg daily CHANGE metoprolol succinate (Toprol XL) 25 mg two times daily  *If you need a refill on your cardiac medications before your next appointment, please call your pharmacy*  Lab Work: BMET, UA, Thyroid panel today  If you have labs (blood work) drawn today and your tests are completely normal, you will receive your results only by: MyChart Message (if you have MyChart) OR A paper copy in the mail If you have any lab test that is abnormal or we need to change your treatment, we will call you to review the results.  Testing: Your physician has requested that you have a carotid duplex (September 2024). This test is an ultrasound of the carotid arteries in your neck. It looks at blood flow through these arteries that supply the brain with blood. Allow one hour for this exam. There are no restrictions or special instructions.  Follow-Up: At [facility], you and your health needs are our priority.  As part of our continuing mission to provide you with exceptional heart care, we have created designated Provider Care Teams.  These Care Teams include your primary Cardiologist (physician) and Advanced Practice Providers (APPs -  Physician Assistants and Nurse Practitioners) who all work together to provide you with the care you need, when you need it.  We recommend signing up for the patient portal called "MyChart".  Sign up information is provided on this After Visit Summary.  MyChart is used to connect with patients for Virtual Visits (Telemedicine).  Patients are able to view lab/test results, encounter notes, upcoming appointments, etc.  Non-urgent messages can be sent to your provider as well.   To learn more about what you can do with MyChart, go to [email or web address].    Your next appointment:   3-4 month(s)  Provider:   Thurmon Fair, MD

## 2022-12-08 LAB — BASIC METABOLIC PANEL
BUN/Creatinine Ratio: 18 (ref 12–28)
BUN: 20 mg/dL (ref 8–27)
CO2: 22 mmol/L (ref 20–29)
Calcium: 9.8 mg/dL (ref 8.7–10.3)
Chloride: 102 mmol/L (ref 96–106)
Creatinine, Ser: 1.11 mg/dL — ABNORMAL HIGH (ref 0.57–1.00)
Glucose: 98 mg/dL (ref 70–99)
Potassium: 4.3 mmol/L (ref 3.5–5.2)
Sodium: 140 mmol/L (ref 134–144)
eGFR: 49 mL/min/{1.73_m2} — ABNORMAL LOW (ref >59)

## 2022-12-08 LAB — URINALYSIS, ROUTINE W REFLEX MICROSCOPIC
Bilirubin, UA: NEGATIVE
Glucose, UA: NEGATIVE
Ketones, UA: NEGATIVE
Nitrite, UA: NEGATIVE
RBC, UA: NEGATIVE
Specific Gravity, UA: 1.021 (ref 1.005–1.030)
Urobilinogen, Ur: 1 mg/dL (ref 0.2–1.0)
pH, UA: 6 (ref 5.0–7.5)

## 2022-12-08 LAB — MICROSCOPIC EXAMINATION
Casts: NONE SEEN /lpf
WBC, UA: >30 /hpf — AB (ref 0–5)

## 2022-12-08 LAB — THYROID PANEL WITH TSH
Free Thyroxine Index: 2.9 (ref 1.2–4.9)
T3 Uptake Ratio: 28 % (ref 24–39)
T4, Total: 10.5 ug/dL (ref 4.5–12.0)
TSH: 3.54 u[IU]/mL (ref 0.450–4.500)

## 2022-12-08 MED ORDER — CIPROFLOXACIN HCL 250 MG PO TABS
250.0000 mg | ORAL_TABLET | Freq: Two times a day (BID) | ORAL | 0 refills | Status: DC
Start: 2022-12-08 — End: 2023-10-22

## 2022-12-08 NOTE — Addendum Note (Signed)
Addended by: Duane Boston on: 12/08/2022 04:38 PM   Modules accepted: Orders

## 2023-03-01 ENCOUNTER — Ambulatory Visit: Payer: Medicare Other | Admitting: Cardiovascular Disease

## 2023-03-03 ENCOUNTER — Other Ambulatory Visit: Payer: Self-pay | Admitting: Family Medicine

## 2023-03-03 DIAGNOSIS — I10 Essential (primary) hypertension: Secondary | ICD-10-CM

## 2023-03-19 ENCOUNTER — Telehealth: Payer: Self-pay | Admitting: Physician Assistant

## 2023-03-19 NOTE — Telephone Encounter (Signed)
Patient stated she has burning during urination. Advised her to contact PCP about her urine symptoms. She verbalized understanding

## 2023-03-19 NOTE — Telephone Encounter (Signed)
  Pt c/o medication issue:  1. Name of Medication:   ciprofloxacin (CIPRO) 250 MG tablet    2. How are you currently taking this medication (dosage and times per day)?   Take 1 tablet (250 mg total) by mouth 2 (two) times daily.    3. Are you having a reaction (difficulty breathing--STAT)? No   4. What is your medication issue?The patient reported experiencing a burning sensation during urination and suspects a urinary tract infection. She would like to inquire if Angie Duke could resend the prescription for ciprofloxacin

## 2023-04-01 ENCOUNTER — Encounter (INDEPENDENT_AMBULATORY_CARE_PROVIDER_SITE_OTHER): Payer: Self-pay

## 2023-04-12 ENCOUNTER — Other Ambulatory Visit: Payer: Self-pay

## 2023-04-12 ENCOUNTER — Telehealth: Payer: Self-pay | Admitting: Family Medicine

## 2023-04-12 DIAGNOSIS — R7989 Other specified abnormal findings of blood chemistry: Secondary | ICD-10-CM

## 2023-04-12 MED ORDER — LEVOTHYROXINE SODIUM 125 MCG PO TABS
125.0000 ug | ORAL_TABLET | Freq: Every day | ORAL | 3 refills | Status: DC
Start: 2023-04-12 — End: 2024-04-14

## 2023-04-12 NOTE — Telephone Encounter (Signed)
Sent in the Levothyroxine . The eliquis Dr Beverely Low did not prescribe so we did not send in

## 2023-04-12 NOTE — Telephone Encounter (Signed)
Encourage patient to contact the pharmacy for refills or they can request refills through [facility]  (Please schedule appointment if patient has not been seen in over a year)    WHAT PHARMACY WOULD THEY LIKE THIS SENT TO: [id number] - [place], [place] - [address]   MEDICATION NAME & DOSE: levothyroxine (SYNTHROID) 125 MCG tablet  apixaban (ELIQUIS) 5 MG TABS tablet   NOTES/COMMENTS FROM PATIENT:       Front office please notify patient: It takes 48-72 hours to process rx refill requests Ask patient to call pharmacy to ensure rx is ready before heading there.

## 2023-04-23 ENCOUNTER — Ambulatory Visit (INDEPENDENT_AMBULATORY_CARE_PROVIDER_SITE_OTHER): Payer: Medicare Other | Admitting: Family Medicine

## 2023-04-23 ENCOUNTER — Encounter: Payer: Self-pay | Admitting: Family Medicine

## 2023-04-23 VITALS — BP 116/58 | HR 68 | Temp 98.5°F | Resp 18 | Ht 63.0 in | Wt 161.5 lb

## 2023-04-23 DIAGNOSIS — I10 Essential (primary) hypertension: Secondary | ICD-10-CM | POA: Diagnosis not present

## 2023-04-23 DIAGNOSIS — E785 Hyperlipidemia, unspecified: Secondary | ICD-10-CM | POA: Diagnosis not present

## 2023-04-23 DIAGNOSIS — N390 Urinary tract infection, site not specified: Secondary | ICD-10-CM | POA: Diagnosis not present

## 2023-04-23 DIAGNOSIS — E890 Postprocedural hypothyroidism: Secondary | ICD-10-CM | POA: Diagnosis not present

## 2023-04-23 LAB — POCT URINALYSIS DIPSTICK
Bilirubin, UA: POSITIVE
Glucose, UA: NEGATIVE
Protein, UA: POSITIVE — AB
Spec Grav, UA: 1.025 (ref 1.010–1.025)
Urobilinogen, UA: 0.2 U/dL
pH, UA: 6 (ref 5.0–8.0)

## 2023-04-23 MED ORDER — ALPRAZOLAM 0.5 MG PO TABS: 0.5000 mg | ORAL_TABLET | Freq: Every day | ORAL | 1 refills | Status: DC

## 2023-04-23 NOTE — Patient Instructions (Addendum)
Follow up in 6 months to recheck BP and cholesterol We'll notify you of your lab results and make any changes if needed Continue to work on healthy diet and regular physical activity Call with any questions or concerns Stay Safe!  Stay Healthy! Hang in there!  You're doing great!

## 2023-04-23 NOTE — Progress Notes (Unsigned)
   Subjective:    Patient ID: Colleen Wright, female    DOB: [birth year], 82 y.o.   MRN: [id number]  HPI HTN- chronic problem, on Losartan hydrochlorothiazide 50/12.5mg  daily, Metoprolol XL 25mg  BID.  No CP, SOB, HA's, visual changes  Hyperlipidemia- chronic problem, on Crestor 20mg  daily.  No abd pain, N/V.  Hypothyroid- chronic problem, on Levothyroxine daily.  + fatigue.  Recurrent UTI- has intermittent burning w/ urination   Review of Systems For ROS see HPI     Objective:   Physical Exam Vitals reviewed.  Constitutional:      General: She is not in acute distress.    Appearance: Normal appearance. She is well-developed. She is not ill-appearing.  HENT:     Head: Normocephalic and atraumatic.  Eyes:     Conjunctiva/sclera: Conjunctivae normal.     Pupils: Pupils are equal, round, and reactive to light.  Neck:     Thyroid: No thyromegaly.  Cardiovascular:     Rate and Rhythm: Normal rate. Rhythm irregular.     Heart sounds: Normal heart sounds. No murmur heard. Pulmonary:     Effort: Pulmonary effort is normal. No respiratory distress.     Breath sounds: Normal breath sounds.  Abdominal:     General: There is no distension.     Palpations: Abdomen is soft.     Tenderness: There is no abdominal tenderness.  Musculoskeletal:     Cervical back: Normal range of motion and neck supple.  Lymphadenopathy:     Cervical: No cervical adenopathy.  Skin:    General: Skin is warm and dry.  Neurological:     Mental Status: She is alert and oriented to person, place, and time.  Psychiatric:        Behavior: Behavior normal.           Assessment & Plan:

## 2023-04-24 LAB — HEPATIC FUNCTION PANEL
AG Ratio: 1.3 (calc) (ref 1.0–2.5)
ALT: 31 U/L — ABNORMAL HIGH (ref 6–29)
AST: 55 U/L — ABNORMAL HIGH (ref 10–35)
Albumin: 4 g/dL (ref 3.6–5.1)
Alkaline phosphatase (APISO): 72 U/L (ref 37–153)
Bilirubin, Direct: 0.2 mg/dL (ref 0.0–0.2)
Globulin: 3 g/dL (ref 1.9–3.7)
Indirect Bilirubin: 0.5 mg/dL (ref 0.2–1.2)
Total Bilirubin: 0.7 mg/dL (ref 0.2–1.2)
Total Protein: 7 g/dL (ref 6.1–8.1)

## 2023-04-24 LAB — TSH: TSH: 3.59 m[IU]/L (ref 0.40–4.50)

## 2023-04-24 LAB — BASIC METABOLIC PANEL
BUN/Creatinine Ratio: 22 (calc) (ref 6–22)
BUN: 33 mg/dL — ABNORMAL HIGH (ref 7–25)
CO2: 25 mmol/L (ref 20–32)
Calcium: 9.3 mg/dL (ref 8.6–10.4)
Chloride: 99 mmol/L (ref 98–110)
Creat: 1.5 mg/dL — ABNORMAL HIGH (ref 0.60–0.95)
Glucose, Bld: 107 mg/dL — ABNORMAL HIGH (ref 65–99)
Potassium: 3.6 mmol/L (ref 3.5–5.3)
Sodium: 137 mmol/L (ref 135–146)

## 2023-04-24 LAB — CBC WITH DIFFERENTIAL/PLATELET
Absolute Monocytes: 560 {cells}/uL (ref 200–950)
Basophils Absolute: 49 {cells}/uL (ref 0–200)
Basophils Relative: 0.7 %
Eosinophils Absolute: 259 cells/uL (ref 15–500)
Eosinophils Relative: 3.7 %
HCT: 35.7 % (ref 35.0–45.0)
Hemoglobin: 12.2 g/dL (ref 11.7–15.5)
Lymphs Abs: 2338 cells/uL (ref 850–3900)
MCH: 30.3 pg (ref 27.0–33.0)
MCHC: 34.2 g/dL (ref 32.0–36.0)
MCV: 88.6 fL (ref 80.0–100.0)
MPV: 11.5 fL (ref 7.5–12.5)
Monocytes Relative: 8 %
Neutro Abs: 3794 cells/uL (ref 1500–7800)
Neutrophils Relative %: 54.2 %
Platelets: 190 10*3/uL (ref 140–400)
RBC: 4.03 10*6/uL (ref 3.80–5.10)
RDW: 13.6 % (ref 11.0–15.0)
Total Lymphocyte: 33.4 %
WBC: 7 10*3/uL (ref 3.8–10.8)

## 2023-04-24 LAB — LIPID PANEL
Cholesterol: 123 mg/dL (ref <200)
HDL: 46 mg/dL — ABNORMAL LOW (ref >=50)
LDL Cholesterol (Calc): 56 mg/dL
Non-HDL Cholesterol (Calc): 77 mg/dL (ref <130)
Total CHOL/HDL Ratio: 2.7 (calc) (ref <5.0)
Triglycerides: 128 mg/dL (ref <150)

## 2023-04-25 NOTE — Assessment & Plan Note (Signed)
Chronic problem.  Currently on Crestor 20mg daily w/o difficulty.  Check labs.  Adjust meds prn  

## 2023-04-25 NOTE — Assessment & Plan Note (Signed)
Ongoing issue.  Pt reports fatigue.  She is unclear if fatigue is due to her age or other issue.  Check labs.  Adjust meds prn

## 2023-04-25 NOTE — Assessment & Plan Note (Signed)
Pt has hx of recurrent UTI's.  Again having intermittent burning.  UA is suspicious for infxn but given that sxs are intermittent and not constant will hold off on abx at this time.  Will wait until cx results available.

## 2023-04-25 NOTE — Assessment & Plan Note (Signed)
Chronic problem.  On Losartan hydrochlorothiazide 50/12.5mg  daily and Metoprolol XL 25mg  BID w/o difficulty.  BP well controlled.  Check labs due to use of ARB and diuretic but no anticipated med changes.  Will follow.

## 2023-04-26 ENCOUNTER — Telehealth: Payer: Self-pay

## 2023-04-26 ENCOUNTER — Other Ambulatory Visit: Payer: Self-pay

## 2023-04-26 DIAGNOSIS — N390 Urinary tract infection, site not specified: Secondary | ICD-10-CM

## 2023-04-26 LAB — URINE CULTURE
MICRO NUMBER:: 15432945
SPECIMEN QUALITY:: ADEQUATE

## 2023-04-26 MED ORDER — CEPHALEXIN 500 MG PO CAPS: 500.0000 mg | ORAL_CAPSULE | Freq: Two times a day (BID) | ORAL | 0 refills | Status: AC

## 2023-04-26 NOTE — Telephone Encounter (Signed)
-----   Message from Neena Rhymes sent at 04/26/2023  7:15 AM EDT ----- Your urine culture does show a UTI.  Based on this, we will start Cephalexin 500mg  twice daily x5 days (#10, no refills).  This will treat your infection and you have tolerated this well in the past.

## 2023-04-26 NOTE — Telephone Encounter (Signed)
Pt is aware of the lab results and the Cephalexin 500 mg BID # 10 has been sent in

## 2023-05-03 ENCOUNTER — Ambulatory Visit (HOSPITAL_COMMUNITY)
Admission: RE | Admit: 2023-05-03 | Discharge: 2023-05-03 | Disposition: A | Discharge status: Home / Self Care | Payer: Medicare Other | Source: Ambulatory Visit | Attending: Cardiology | Admitting: Cardiology

## 2023-05-03 DIAGNOSIS — I6523 Occlusion and stenosis of bilateral carotid arteries: Secondary | ICD-10-CM | POA: Diagnosis not present

## 2023-05-04 ENCOUNTER — Telehealth: Payer: Self-pay

## 2023-05-04 NOTE — Telephone Encounter (Signed)
Spoke with pt. Pt was notified of carotid u/s results. Pt will follow up as planned and voiced understanding.

## 2023-05-11 DIAGNOSIS — L820 Inflamed seborrheic keratosis: Secondary | ICD-10-CM | POA: Diagnosis not present

## 2023-05-11 DIAGNOSIS — D485 Neoplasm of uncertain behavior of skin: Secondary | ICD-10-CM | POA: Diagnosis not present

## 2023-05-11 DIAGNOSIS — Z85828 Personal history of other malignant neoplasm of skin: Secondary | ICD-10-CM | POA: Diagnosis not present

## 2023-05-11 DIAGNOSIS — L570 Actinic keratosis: Secondary | ICD-10-CM | POA: Diagnosis not present

## 2023-05-11 DIAGNOSIS — C44329 Squamous cell carcinoma of skin of other parts of face: Secondary | ICD-10-CM | POA: Diagnosis not present

## 2023-05-11 DIAGNOSIS — D2262 Melanocytic nevi of left upper limb, including shoulder: Secondary | ICD-10-CM | POA: Diagnosis not present

## 2023-05-11 DIAGNOSIS — L821 Other seborrheic keratosis: Secondary | ICD-10-CM | POA: Diagnosis not present

## 2023-05-11 DIAGNOSIS — C44320 Squamous cell carcinoma of skin of unspecified parts of face: Secondary | ICD-10-CM | POA: Diagnosis not present

## 2023-05-20 ENCOUNTER — Ambulatory Visit: Payer: Medicare Other

## 2023-05-20 DIAGNOSIS — Z0000 Encounter for general adult medical examination without abnormal findings: Secondary | ICD-10-CM | POA: Diagnosis not present

## 2023-05-20 NOTE — Progress Notes (Signed)
Subjective:   Colleen Wright is a 82 y.o. female who presents for Medicare Annual (Subsequent) preventive examination.  Visit Complete: Virtual  I connected with  [name] on 05/20/23 by a audio enabled telemedicine application and verified that I am speaking with the correct person using two identifiers.  Patient Location: Home  Provider Location: Home Office  I discussed the limitations of evaluation and management by telemedicine. The patient expressed understanding and agreed to proceed.  Because this visit was a virtual/telehealth visit, some criteria may be missing or patient reported. Any vitals not documented were not able to be obtained and vitals that have been documented are patient reported.    Cardiac Risk Factors include: advanced age (>93men, >72 women);hypertension;family history of premature cardiovascular disease     Objective:    There were no vitals filed for this visit. There is no height or weight on file to calculate BMI.     05/20/2023   11:36 AM 04/30/2022    1:26 PM 05/31/2021    3:08 PM 05/10/2020    7:45 PM 04/03/2019    1:43 PM 04/03/2018    4:28 PM 01/24/2016    7:44 AM  Advanced Directives  Does Patient Have a Medical Advance Directive? Yes Yes Yes No No No No  Type of Advance Directive Living will Healthcare Power of [place];Living will       Copy of Healthcare Power of Attorney in Chart?  No - copy requested       Would patient like information on creating a medical advance directive?    No - Patient declined   No - patient declined information    Current Medications (verified) Outpatient Encounter Medications as of 05/20/2023  Medication Sig   ALPRAZolam (XANAX) 0.5 MG tablet Take 1 tablet (0.5 mg total) by mouth at bedtime.   apixaban (ELIQUIS) 5 MG TABS tablet Take 1 tablet (5 mg total) by mouth 2 (two) times daily.   azelastine (ASTELIN) 0.1 % nasal spray Place 1 spray into both nostrils 2 (two) times daily. Use in each nostril as  directed   cetirizine (ZYRTEC) 10 MG tablet TAKE 1 TABLET BY MOUTH EVERY DAY   levothyroxine (SYNTHROID) 125 MCG tablet Take 1 tablet (125 mcg total) by mouth daily.   losartan-hydrochlorothiazide (HYZAAR) 50-12.5 MG tablet TAKE 1 TABLET BY MOUTH EVERY DAY   meclizine (ANTIVERT) 25 MG tablet Take 1 tablet (25 mg total) by mouth 3 (three) times daily as needed for dizziness.   meloxicam (MOBIC) 7.5 MG tablet Take 7.5 mg by mouth 2 (two) times daily as needed.   metoprolol succinate (TOPROL-XL) 50 MG 24 hr tablet Take 0.5 tablets (25 mg total) by mouth 2 (two) times daily. Take with or immediately following a meal.   rosuvastatin (CRESTOR) 20 MG tablet Take 1 tablet (20 mg total) by mouth daily.   ciprofloxacin (CIPRO) 250 MG tablet Take 1 tablet (250 mg total) by mouth 2 (two) times daily. (Patient not taking: Reported on 04/23/2023)   nitroGLYCERIN (NITROSTAT) 0.4 MG SL tablet Place 1 tablet (0.4 mg total) under the tongue every 5 (five) minutes as needed.   tiZANidine (ZANAFLEX) 4 MG tablet Take 1 tablet (4 mg total) by mouth every 8 (eight) hours as needed for muscle spasms. (Patient not taking: Reported on 04/23/2023)   No facility-administered encounter medications on file as of 05/20/2023.    Allergies (verified) Amoxicillin   History: Past Medical History:  Diagnosis Date   Adenomatous colon polyp  2006   Allergy    Arthritis    low back   Back pain 06/13/2011   CAD (coronary artery disease) 06/12/2011   Depression    GERD (gastroesophageal reflux disease)    Hiatal hernia    Hyperlipidemia    Hypertension    Hypothyroidism 06/12/2011   Leg fracture    Pain in joint, ankle and foot 11/02/2012   Pain of left heel 05/23/2012   Skin lesions, generalized 05/23/2012   Thyroid cancer (HCC)    Ulcer    Vertigo 06/13/2011   Past Surgical History:  Procedure Laterality Date   BALLOON ANGIOPLASTY, ARTERY     CARDIAC CATHETERIZATION N/A 01/24/2016   Procedure: Left Heart Cath and  Coronary Angiography;  Surgeon: Peter M Swaziland, MD;  Location: [facility] INVASIVE CV LAB;  Service: Cardiovascular;  Laterality: N/A;   EP IMPLANTABLE DEVICE N/A 09/24/2015   Procedure: Loop Recorder Insertion;  Surgeon: Thurmon Fair, MD;  Location: [facility] INVASIVE CV LAB;  Service: Cardiovascular;  Laterality: N/A;   FRACTURE SURGERY     left leg, pin and 5 bolts in place in lower tibia   LESION REMOVAL     vaginal   THYROIDECTOMY, PARTIAL     twice   TUBAL LIGATION     growth removed from posterior vaginal vault   Family History  Problem Relation Age of Onset   Cancer Mother        ovary/uterus-can't remember   Stroke Mother    Heart disease Father    Heart disease Sister        bypass   Stroke Sister    Cancer Sister        LN, bone   Hypertension Sister    Heart disease Brother        bypass   Cancer Cousin        breast   Social History   Socioeconomic History   Marital status: Married    Spouse name: Not on file   Number of children: Not on file   Years of education: Not on file   Highest education level: Not on file  Occupational History   Not on file  Tobacco Use   Smoking status: Former    Current packs/day: 0.00    Average packs/day: 0.3 packs/day for 5.0 years (1.3 ttl pk-yrs)    Types: Cigarettes    Start date: 11/16/1963    Quit date: 11/15/1968    Years since quitting: 54.5   Smokeless tobacco: Never  Vaping Use   Vaping status: Never Used  Substance and Sexual Activity   Alcohol use: No    Alcohol/week: 0.0 standard drinks of alcohol   Drug use: No   Sexual activity: Not Currently  Other Topics Concern   Not on file  Social History Narrative   Not on file   Social Determinants of Health   Financial Resource Strain: Low Risk  (05/20/2023)   Overall Financial Resource Strain (CARDIA)    Difficulty of Paying Living Expenses: Not hard at all  Food Insecurity: No Food Insecurity (05/20/2023)   Hunger Vital Sign    Worried About Running Out of Food in the  Last Year: Never true    Ran Out of Food in the Last Year: Never true  Transportation Needs: No Transportation Needs (05/20/2023)   PRAPARE - Administrator, Civil Service (Medical): No    Lack of Transportation (Non-Medical): No  Physical Activity: Inactive (05/20/2023)   Exercise Vital Sign  Days of Exercise per Week: 0 days    Minutes of Exercise per Session: 0 min  Stress: No Stress Concern Present (05/20/2023)   [employer] of Occupational Health - Occupational Stress Questionnaire    Feeling of Stress : Not at all  Social Connections: Moderately Integrated (05/20/2023)   Social Connection and Isolation Panel [NHANES]    Frequency of Communication with Friends and Family: Twice a week    Frequency of Social Gatherings with Friends and Family: Three times a week    Attends Religious Services: More than 4 times per year    Active Member of Clubs or Organizations: No    Attends Banker Meetings: Never    Marital Status: Married    Tobacco Counseling Counseling given: Not Answered   Clinical Intake:  Pre-visit preparation completed: Yes  Pain : No/denies pain     Diabetes: No  How often do you need to have someone help you when you read instructions, pamphlets, or other written materials from your doctor or pharmacy?: 1 - Never  Interpreter Needed?: No  Information entered by :: Remi Haggard LPN   Activities of Daily Living    05/20/2023   11:37 AM 04/23/2023    2:35 PM  In your present state of health, do you have any difficulty performing the following activities:  Hearing? 0 0  Vision? 0 0  Difficulty concentrating or making decisions? 0 0  Walking or climbing stairs? 1 0  Dressing or bathing? 0 0  Doing errands, shopping? 1 0  Preparing Food and eating ? N   Using the Toilet? N   In the past six months, have you accidently leaked urine? Y   Do you have problems with loss of bowel control? N   Managing your Medications? N    Managing your Finances? N   Housekeeping or managing your Housekeeping? N     Patient Care Team: Sheliah Hatch, MD as PCP - General (Family Medicine) Croitoru, Rachelle Hora, MD as PCP - Cardiology (Cardiology) Thurmon Fair, MD as Consulting Physician (Cardiology) Drema Dallas, DO as Consulting Physician (Neurology) Dahlia Byes, [facility] (Inactive) as Pharmacist (Pharmacist) Duke, Roe Rutherford, PA as Physician Assistant (Cardiology)  Indicate any recent Medical Services you may have received from other than [facility] providers in the past year (date may be approximate).     Assessment:   This is a routine wellness examination for [facility].  Hearing/Vision screen Hearing Screening - Comments:: No hearing aids Vision Screening - Comments:: Up to date [facility]   Goals Addressed             This Visit's Progress    Patient Stated       Continue current lifestyle       Depression Screen    05/20/2023   11:40 AM 04/23/2023    2:35 PM 04/30/2022    1:24 PM 01/19/2022   11:26 AM 06/06/2021   12:45 PM 05/16/2021   11:52 AM 06/13/2020    1:08 PM  PHQ 2/9 Scores  PHQ - 2 Score 0 0 0 0 0 0 0  PHQ- 9 Score 4 0  0 0 4 0    Fall Risk    05/20/2023   11:34 AM 04/23/2023    2:35 PM 04/30/2022    1:22 PM 01/19/2022   11:26 AM 06/06/2021   12:44 PM  Fall Risk   Falls in the past year? 1 1 0 1 1  Number falls in past yr:  0 0 0 0 0  Injury with Fall? 0 0 0 0 1  Risk for fall due to :  No Fall Risks No Fall Risks History of fall(s) No Fall Risks;History of fall(s)  Follow up Falls evaluation completed;Education provided;Falls prevention discussed Falls evaluation completed Falls prevention discussed Falls evaluation completed Falls evaluation completed    MEDICARE RISK AT HOME: Medicare Risk at Home Any stairs in or around the home?: No If so, are there any without handrails?: No Home free of loose throw rugs in walkways, pet beds, electrical cords, etc?: Yes Adequate lighting in  your home to reduce risk of falls?: Yes Life alert?: No Use of a cane, walker or w/c?: No Grab bars in the bathroom?: Yes Shower chair or bench in shower?: No Elevated toilet seat or a handicapped toilet?: No  TIMED UP AND GO:  Was the test performed?  No    Cognitive Function:        05/20/2023   11:38 AM 04/30/2022    1:26 PM  6CIT Screen  What Year? 0 points 0 points  What month? 0 points 0 points  What time? 0 points 0 points  Count back from 20 0 points 0 points  Months in reverse 0 points 0 points  Repeat phrase 4 points 4 points  Total Score 4 points 4 points    Immunizations Immunization History  Administered Date(s) Administered   Fluad Quad(high Dose 65+) 05/01/2019, 06/13/2020, 06/19/2021   Influenza Whole 05/15/2011   Influenza, High Dose Seasonal PF 04/14/2018   Influenza,inj,Quad PF,6+ Mos 04/15/2014, 06/21/2015, 07/05/2016, 06/04/2017   Pneumococcal Conjugate-13 06/21/2015   Pneumococcal Polysaccharide-23 09/25/2013    TDAP status: Due, Education has been provided regarding the importance of this vaccine. Advised may receive this vaccine at local pharmacy or Health Dept. Aware to provide a copy of the vaccination record if obtained from local pharmacy or Health Dept. Verbalized acceptance and understanding.  Flu Vaccine status: Due, Education has been provided regarding the importance of this vaccine. Advised may receive this vaccine at local pharmacy or Health Dept. Aware to provide a copy of the vaccination record if obtained from local pharmacy or Health Dept. Verbalized acceptance and understanding.  Pneumococcal vaccine status: Up to date  Covid-19 vaccine status: Information provided on how to obtain vaccines.   Qualifies for Shingles Vaccine? Yes   Zostavax completed No   Shingrix Completed?: No.    Education has been provided regarding the importance of this vaccine. Patient has been advised to call insurance company to determine out of pocket  expense if they have not yet received this vaccine. Advised may also receive vaccine at local pharmacy or Health Dept. Verbalized acceptance and understanding.  Screening Tests Health Maintenance  Topic Date Due   INFLUENZA VACCINE  11/15/2023 (Originally 03/18/2023)   Medicare Annual Wellness (AWV)  05/19/2024   Pneumonia Vaccine 61+ Years old  Completed   DEXA SCAN  Completed   HPV VACCINES  Aged Out   DTaP/Tdap/Td  Discontinued   COVID-19 Vaccine  Discontinued   Zoster Vaccines- Shingrix  Discontinued    Health Maintenance  There are no preventive care reminders to display for this patient.   Colorectal cancer screening: No longer required.   Mammogram status: No longer required due to age.  Bone Density declined  Lung Cancer Screening: (Low Dose CT Chest recommended if Age 69-80 years, 20 pack-year currently smoking OR have quit w/in 15years.) does not qualify.   Lung Cancer Screening Referral:  Additional Screening:  Hepatitis C Screening: does not qualify  Vision Screening: Recommended annual ophthalmology exams for early detection of glaucoma and other disorders of the eye. Is the patient up to date with their annual eye exam?  Yes  Who is the provider or what is the name of the office in which the patient attends annual eye exams? [facility] If pt is not established with a provider, would they like to be referred to a provider to establish care? No .   Dental Screening: Recommended annual dental exams for proper oral hygiene    Community Resource Referral / Chronic Care Management: CRR required this visit?  No   CCM required this visit?  No     Plan:     I have personally reviewed and noted the following in the patient's chart:   Medical and social history Use of alcohol, tobacco or illicit drugs  Current medications and supplements including opioid prescriptions. Patient is not currently taking opioid prescriptions. Functional ability and  status Nutritional status Physical activity Advanced directives List of other physicians Hospitalizations, surgeries, and ER visits in previous 12 months Vitals Screenings to include cognitive, depression, and falls Referrals and appointments  In addition, I have reviewed and discussed with patient certain preventive protocols, quality metrics, and best practice recommendations. A written personalized care plan for preventive services as well as general preventive health recommendations were provided to patient.     Remi Haggard, LPN   29/12/6211   After Visit Summary: (MyChart) Due to this being a telephonic visit, the after visit summary with patients personalized plan was offered to patient via MyChart   Nurse Notes:

## 2023-05-20 NOTE — Patient Instructions (Signed)
Ms. Coltrain , Thank you for taking time to come for your Medicare Wellness Visit. I appreciate your ongoing commitment to your health goals. Please review the following plan we discussed and let me know if I can assist you in the future.   Screening recommendations/referrals: Colonoscopy: no longer required Mammogram: no longer required Bone Density: Education provided Recommended yearly ophthalmology/optometry visit for glaucoma screening and checkup Recommended yearly dental visit for hygiene and checkup  Vaccinations: Influenza vaccine: Education provided Pneumococcal vaccine: up to date Tdap vaccine: Education provided     Advanced directives: yes not on file     Preventive Care 65 Years and Older, Female Preventive care refers to lifestyle choices and visits with your health care provider that can promote health and wellness. What does preventive care include? A yearly physical exam. This is also called an annual well check. Dental exams once or twice a year. Routine eye exams. Ask your health care provider how often you should have your eyes checked. Personal lifestyle choices, including: Daily care of your teeth and gums. Regular physical activity. Eating a healthy diet. Avoiding tobacco and drug use. Limiting alcohol use. Practicing safe sex. Taking low-dose aspirin every day. Taking vitamin and mineral supplements as recommended by your health care provider. What happens during an annual well check? The services and screenings done by your health care provider during your annual well check will depend on your age, overall health, lifestyle risk factors, and family history of disease. Counseling  Your health care provider may ask you questions about your: Alcohol use. Tobacco use. Drug use. Emotional well-being. Home and relationship well-being. Sexual activity. Eating habits. History of falls. Memory and ability to understand (cognition). Work and work  Astronomer. Reproductive health. Screening  You may have the following tests or measurements: Height, weight, and BMI. Blood pressure. Lipid and cholesterol levels. These may be checked every 5 years, or more frequently if you are over 55 years old. Skin check. Lung cancer screening. You may have this screening every year starting at age 90 if you have a 30-pack-year history of smoking and currently smoke or have quit within the past 15 years. Fecal occult blood test (FOBT) of the stool. You may have this test every year starting at age 75. Flexible sigmoidoscopy or colonoscopy. You may have a sigmoidoscopy every 5 years or a colonoscopy every 10 years starting at age 61. Hepatitis C blood test. Hepatitis B blood test. Sexually transmitted disease (STD) testing. Diabetes screening. This is done by checking your blood sugar (glucose) after you have not eaten for a while (fasting). You may have this done every 1-3 years. Bone density scan. This is done to screen for osteoporosis. You may have this done starting at age 20. Mammogram. This may be done every 1-2 years. Talk to your health care provider about how often you should have regular mammograms. Talk with your health care provider about your test results, treatment options, and if necessary, the need for more tests. Vaccines  Your health care provider may recommend certain vaccines, such as: Influenza vaccine. This is recommended every year. Tetanus, diphtheria, and acellular pertussis (Tdap, Td) vaccine. You may need a Td booster every 10 years. Zoster vaccine. You may need this after age 61. Pneumococcal 13-valent conjugate (PCV13) vaccine. One dose is recommended after age 18. Pneumococcal polysaccharide (PPSV23) vaccine. One dose is recommended after age 55. Talk to your health care provider about which screenings and vaccines you need and how often you need them.  This information is not intended to replace advice given to you by  your health care provider. Make sure you discuss any questions you have with your health care provider. Document Released: 08/30/2015 Document Revised: 04/22/2016 Document Reviewed: 06/04/2015 Elsevier Interactive Patient Education  2017 [employer].  Fall Prevention in the Home Falls can cause injuries. They can happen to people of all ages. There are many things you can do to make your home safe and to help prevent falls. What can I do on the outside of my home? Regularly fix the edges of walkways and driveways and fix any cracks. Remove anything that might make you trip as you walk through a door, such as a raised step or threshold. Trim any bushes or trees on the path to your home. Use bright outdoor lighting. Clear any walking paths of anything that might make someone trip, such as rocks or tools. Regularly check to see if handrails are loose or broken. Make sure that both sides of any steps have handrails. Any raised decks and porches should have guardrails on the edges. Have any leaves, snow, or ice cleared regularly. Use sand or salt on walking paths during winter. Clean up any spills in your garage right away. This includes oil or grease spills. What can I do in the bathroom? Use night lights. Install grab bars by the toilet and in the tub and shower. Do not use towel bars as grab bars. Use non-skid mats or decals in the tub or shower. If you need to sit down in the shower, use a plastic, non-slip stool. Keep the floor dry. Clean up any water that spills on the floor as soon as it happens. Remove soap buildup in the tub or shower regularly. Attach bath mats securely with double-sided non-slip rug tape. Do not have throw rugs and other things on the floor that can make you trip. What can I do in the bedroom? Use night lights. Make sure that you have a light by your bed that is easy to reach. Do not use any sheets or blankets that are too big for your bed. They should not hang  down onto the floor. Have a firm chair that has side arms. You can use this for support while you get dressed. Do not have throw rugs and other things on the floor that can make you trip. What can I do in the kitchen? Clean up any spills right away. Avoid walking on wet floors. Keep items that you use a lot in easy-to-reach places. If you need to reach something above you, use a strong step stool that has a grab bar. Keep electrical cords out of the way. Do not use floor polish or wax that makes floors slippery. If you must use wax, use non-skid floor wax. Do not have throw rugs and other things on the floor that can make you trip. What can I do with my stairs? Do not leave any items on the stairs. Make sure that there are handrails on both sides of the stairs and use them. Fix handrails that are broken or loose. Make sure that handrails are as long as the stairways. Check any carpeting to make sure that it is firmly attached to the stairs. Fix any carpet that is loose or worn. Avoid having throw rugs at the top or bottom of the stairs. If you do have throw rugs, attach them to the floor with carpet tape. Make sure that you have a light switch at the  top of the stairs and the bottom of the stairs. If you do not have them, ask someone to add them for you. What else can I do to help prevent falls? Wear shoes that: Do not have high heels. Have rubber bottoms. Are comfortable and fit you well. Are closed at the toe. Do not wear sandals. If you use a stepladder: Make sure that it is fully opened. Do not climb a closed stepladder. Make sure that both sides of the stepladder are locked into place. Ask someone to hold it for you, if possible. Clearly mark and make sure that you can see: Any grab bars or handrails. First and last steps. Where the edge of each step is. Use tools that help you move around (mobility aids) if they are needed. These  include: Canes. Walkers. Scooters. Crutches. Turn on the lights when you go into a dark area. Replace any light bulbs as soon as they burn out. Set up your furniture so you have a clear path. Avoid moving your furniture around. If any of your floors are uneven, fix them. If there are any pets around you, be aware of where they are. Review your medicines with your doctor. Some medicines can make you feel dizzy. This can increase your chance of falling. Ask your doctor what other things that you can do to help prevent falls. This information is not intended to replace advice given to you by your health care provider. Make sure you discuss any questions you have with your health care provider. Document Released: 05/30/2009 Document Revised: 01/09/2016 Document Reviewed: 09/07/2014 Elsevier Interactive Patient Education  2017 [employer].

## 2023-05-26 ENCOUNTER — Encounter: Payer: Self-pay | Admitting: Family Medicine

## 2023-05-26 NOTE — Telephone Encounter (Signed)
Patient asking if she can start Cymbalta for chronic pain, last seen one month ago. Please advise

## 2023-05-27 MED ORDER — DULOXETINE HCL 20 MG PO CPEP: 20.0000 mg | ORAL_CAPSULE | Freq: Every day | ORAL | 3 refills | Status: DC

## 2023-06-01 DIAGNOSIS — M545 Low back pain, unspecified: Secondary | ICD-10-CM | POA: Diagnosis not present

## 2023-06-01 DIAGNOSIS — M47816 Spondylosis without myelopathy or radiculopathy, lumbar region: Secondary | ICD-10-CM | POA: Diagnosis not present

## 2023-06-01 DIAGNOSIS — M8580 Other specified disorders of bone density and structure, unspecified site: Secondary | ICD-10-CM | POA: Diagnosis not present

## 2023-06-01 DIAGNOSIS — C44329 Squamous cell carcinoma of skin of other parts of face: Secondary | ICD-10-CM | POA: Diagnosis not present

## 2023-06-01 DIAGNOSIS — Z85828 Personal history of other malignant neoplasm of skin: Secondary | ICD-10-CM | POA: Diagnosis not present

## 2023-06-15 ENCOUNTER — Other Ambulatory Visit: Payer: Self-pay | Admitting: Cardiovascular Disease

## 2023-06-15 ENCOUNTER — Other Ambulatory Visit: Payer: Self-pay | Admitting: Family Medicine

## 2023-06-15 DIAGNOSIS — I10 Essential (primary) hypertension: Secondary | ICD-10-CM

## 2023-06-17 DIAGNOSIS — M8588 Other specified disorders of bone density and structure, other site: Secondary | ICD-10-CM | POA: Diagnosis not present

## 2023-06-17 DIAGNOSIS — E2839 Other primary ovarian failure: Secondary | ICD-10-CM | POA: Diagnosis not present

## 2023-06-22 ENCOUNTER — Other Ambulatory Visit: Payer: Self-pay | Admitting: Family Medicine

## 2023-06-28 ENCOUNTER — Other Ambulatory Visit: Payer: Self-pay | Admitting: Family Medicine

## 2023-06-28 DIAGNOSIS — I10 Essential (primary) hypertension: Secondary | ICD-10-CM

## 2023-08-02 ENCOUNTER — Other Ambulatory Visit: Payer: Self-pay | Admitting: Family Medicine

## 2023-08-17 ENCOUNTER — Other Ambulatory Visit: Payer: Self-pay | Admitting: Family Medicine

## 2023-09-07 ENCOUNTER — Telehealth: Payer: Self-pay | Admitting: Family Medicine

## 2023-09-07 NOTE — Telephone Encounter (Signed)
This is one form and has several patient on it FYI.

## 2023-09-07 NOTE — Telephone Encounter (Signed)
Kk.. as long as we both are aware!

## 2023-09-07 NOTE — Telephone Encounter (Signed)
Type of form received:[facility]   Additional comments: [facility]- Levothyroxine Recall Info   Received ZO:[id number]- Front Desk   Form should be Faxed/mailed to: N/A  Is patient requesting call for pickup:N/A  Form placed: [employer] charge sheet.  Provider will determine charge. N/A  Individual made aware of 3-5 business day turn around No?

## 2023-09-10 NOTE — Telephone Encounter (Signed)
Reviewed. No action needed.

## 2023-10-08 ENCOUNTER — Telehealth: Payer: Self-pay | Admitting: Cardiovascular Disease

## 2023-10-08 NOTE — Telephone Encounter (Signed)
 Pt c/o medication issue:  1. Name of Medication: blood thinner, not sure what the name is  2. How are you currently taking this medication (dosage and times per day)? Once or twice a day, not sure  3. Are you having a reaction (difficulty breathing--STAT)? no  4. What is your medication issue? Patient's daughter in law states the medication is costing the patient $400 and she wants to change the medication to coumadin. Phone:[phone]

## 2023-10-08 NOTE — Telephone Encounter (Signed)
 Returned phone call request.  Spoke to pt about her concern for cost of Eliquis.  Pt states she has been on this medication for years now and she switched insurance hoping that the cost of this med would be attainable.  She specifically is asking to be switched to Coumadin, stating other family members have done the same and have not had any problems doing this.  She states she has talked to her insurance co. And she is responsible for paying around $400 , then after this she will be paying around $75 per month.  She does not wish to continue it, having to pay this much.  She does not believe that she would qualify for any patient assistance.  Educated pt on the need for frequent lab draws and dietary limitations that accompany use of Warfarin.    She would like to see Bettina Gavia, PA (Dr. Erin Hearing first available is 12/15/2023 (DOD)).  Scheduled her for 11/03/23 with [facility], PA.  She does have about one month's worth of Eliquis left.

## 2023-10-08 NOTE — Telephone Encounter (Signed)
 Okay to see a PA.  Thank you

## 2023-10-11 ENCOUNTER — Other Ambulatory Visit: Payer: Self-pay

## 2023-10-11 DIAGNOSIS — E785 Hyperlipidemia, unspecified: Secondary | ICD-10-CM

## 2023-10-11 MED ORDER — METOPROLOL SUCCINATE ER 50 MG PO TB24: 25.0000 mg | ORAL_TABLET | Freq: Two times a day (BID) | ORAL | 0 refills | Status: DC

## 2023-10-21 DIAGNOSIS — M5416 Radiculopathy, lumbar region: Secondary | ICD-10-CM | POA: Insufficient documentation

## 2023-10-22 ENCOUNTER — Ambulatory Visit: Payer: Medicare Other | Admitting: Family Medicine

## 2023-10-22 ENCOUNTER — Encounter: Payer: Self-pay | Admitting: Family Medicine

## 2023-10-22 VITALS — BP 116/62 | HR 67 | Temp 97.9°F | Wt 154.0 lb

## 2023-10-22 DIAGNOSIS — N390 Urinary tract infection, site not specified: Secondary | ICD-10-CM | POA: Diagnosis not present

## 2023-10-22 DIAGNOSIS — E785 Hyperlipidemia, unspecified: Secondary | ICD-10-CM

## 2023-10-22 DIAGNOSIS — I10 Essential (primary) hypertension: Secondary | ICD-10-CM

## 2023-10-22 DIAGNOSIS — E039 Hypothyroidism, unspecified: Secondary | ICD-10-CM | POA: Diagnosis not present

## 2023-10-22 LAB — CBC WITH DIFFERENTIAL/PLATELET
Basophils Absolute: 0 10*3/uL (ref 0.0–0.1)
Basophils Relative: 0.4 % (ref 0.0–3.0)
Eosinophils Absolute: 0.2 10*3/uL (ref 0.0–0.7)
Eosinophils Relative: 3.2 % (ref 0.0–5.0)
HCT: 39.3 % (ref 36.0–46.0)
Hemoglobin: 13.3 g/dL (ref 12.0–15.0)
Lymphocytes Relative: 28.3 % (ref 12.0–46.0)
Lymphs Abs: 2 10*3/uL (ref 0.7–4.0)
MCHC: 33.9 g/dL (ref 30.0–36.0)
MCV: 91.5 fl (ref 78.0–100.0)
Monocytes Absolute: 0.5 10*3/uL (ref 0.1–1.0)
Monocytes Relative: 7.5 % (ref 3.0–12.0)
Neutro Abs: 4.3 10*3/uL (ref 1.4–7.7)
Neutrophils Relative %: 60.6 % (ref 43.0–77.0)
Platelets: 187 10*3/uL (ref 150.0–400.0)
RBC: 4.29 Mil/uL (ref 3.87–5.11)
RDW: 13.5 % (ref 11.5–15.5)
WBC: 7.1 10*3/uL (ref 4.0–10.5)

## 2023-10-22 LAB — BASIC METABOLIC PANEL
BUN: 22 mg/dL (ref 6–23)
CO2: 27 meq/L (ref 19–32)
Calcium: 9.9 mg/dL (ref 8.4–10.5)
Chloride: 100 meq/L (ref 96–112)
Creatinine, Ser: 1.25 mg/dL — ABNORMAL HIGH (ref 0.40–1.20)
GFR: 39.54 mL/min — ABNORMAL LOW (ref >60.00)
Glucose, Bld: 132 mg/dL — ABNORMAL HIGH (ref 70–99)
Potassium: 3.6 meq/L (ref 3.5–5.1)
Sodium: 139 meq/L (ref 135–145)

## 2023-10-22 LAB — HEPATIC FUNCTION PANEL
ALT: 22 U/L (ref 0–35)
AST: 39 U/L — ABNORMAL HIGH (ref 0–37)
Albumin: 4 g/dL (ref 3.5–5.2)
Alkaline Phosphatase: 71 U/L (ref 39–117)
Bilirubin, Direct: 0.2 mg/dL (ref 0.0–0.3)
Total Bilirubin: 0.7 mg/dL (ref 0.2–1.2)
Total Protein: 7.5 g/dL (ref 6.0–8.3)

## 2023-10-22 LAB — POCT URINALYSIS DIPSTICK
Bilirubin, UA: 1
Blood, UA: NEGATIVE
Glucose, UA: NEGATIVE
Ketones, UA: 5
Nitrite, UA: POSITIVE
Protein, UA: NEGATIVE
Spec Grav, UA: 1.015 (ref 1.010–1.025)
Urobilinogen, UA: 0.2 U/dL
pH, UA: 6 (ref 5.0–8.0)

## 2023-10-22 LAB — LIPID PANEL
Cholesterol: 132 mg/dL (ref 0–200)
HDL: 49.2 mg/dL (ref >39.00)
LDL Cholesterol: 55 mg/dL (ref 0–99)
NonHDL: 82.7
Total CHOL/HDL Ratio: 3
Triglycerides: 140 mg/dL (ref 0.0–149.0)
VLDL: 28 mg/dL (ref 0.0–40.0)

## 2023-10-22 LAB — TSH: TSH: 1.89 u[IU]/mL (ref 0.35–5.50)

## 2023-10-22 NOTE — Assessment & Plan Note (Signed)
 Chronic problem.  On Levothyroxine daily.  Reports ongoing fatigue but is not sure if this is age related.  Check labs.  Adjust meds prn

## 2023-10-22 NOTE — Patient Instructions (Signed)
 Schedule your complete physical in 6 months We'll notify you of your lab results and make any changes if needed Keep up the good work on healthy diet and regular physical activity- you look great! Call with any questions or concerns Stay Safe!  Stay Healthy! Happy Spring!

## 2023-10-22 NOTE — Assessment & Plan Note (Signed)
 Chronic problem.  On Crestor 20mg  daily w/o difficulty.  She is down 8 lbs w/o trying but states she is still eating regularly.  Will need to monitor.  Check labs.  Adjust meds prn

## 2023-10-22 NOTE — Assessment & Plan Note (Signed)
 Chronic problem.  On Losartan hydrochlorothiazide and Metoprolol w/ excellent control.  Currently asymptomatic.  Check labs due to ARB and diuretic use but no anticipated med changes.

## 2023-10-22 NOTE — Progress Notes (Signed)
   Subjective:    Patient ID: Colleen Wright, female    DOB: [birth year], 83 y.o.   MRN: [id number]  HPI HTN- chronic problem, on Losartan hydrochlorothiazide 50/12.5mg  daily, Metoprolol 50mg  BID.  Denies CP, SOB, HA's, visual changes, edema.  Hypothyroid- chronic problem, on Levothyroxine daily.  + fatigue.  Hyperlipidemia- chronic problem, on Crestor 20mg  daily.  Pt is down 8 lbs since last visit.  No abd pain, N/V.   Review of Systems For ROS see HPI     Objective:   Physical Exam Vitals reviewed.  Constitutional:      General: She is not in acute distress.    Appearance: Normal appearance. She is well-developed. She is not ill-appearing.  HENT:     Head: Normocephalic and atraumatic.  Eyes:     Conjunctiva/sclera: Conjunctivae normal.     Pupils: Pupils are equal, round, and reactive to light.  Neck:     Thyroid: No thyromegaly.  Cardiovascular:     Rate and Rhythm: Normal rate and regular rhythm.     Pulses: Normal pulses.     Heart sounds: Normal heart sounds. No murmur heard. Pulmonary:     Effort: Pulmonary effort is normal. No respiratory distress.     Breath sounds: Normal breath sounds.  Abdominal:     General: There is no distension.     Palpations: Abdomen is soft.     Tenderness: There is no abdominal tenderness.  Musculoskeletal:     Cervical back: Normal range of motion and neck supple.  Lymphadenopathy:     Cervical: No cervical adenopathy.  Skin:    General: Skin is warm and dry.  Neurological:     General: No focal deficit present.     Mental Status: She is alert and oriented to person, place, and time.  Psychiatric:        Mood and Affect: Mood normal.        Behavior: Behavior normal.        Thought Content: Thought content normal.           Assessment & Plan:

## 2023-10-24 LAB — URINE CULTURE
MICRO NUMBER:: 16174906
SPECIMEN QUALITY:: ADEQUATE

## 2023-10-26 ENCOUNTER — Other Ambulatory Visit: Payer: Self-pay

## 2023-10-26 ENCOUNTER — Telehealth: Payer: Self-pay

## 2023-10-26 ENCOUNTER — Encounter: Payer: Self-pay | Admitting: Family Medicine

## 2023-10-26 MED ORDER — CEPHALEXIN 500 MG PO CAPS: 500.0000 mg | ORAL_CAPSULE | Freq: Two times a day (BID) | ORAL | 0 refills | Status: DC

## 2023-10-26 NOTE — Telephone Encounter (Signed)
 Keflex has been sent in Pt has been notified

## 2023-10-26 NOTE — Telephone Encounter (Signed)
-----   Message from Neena Rhymes sent at 10/26/2023  7:35 AM EDT ----- Your urine culture shows that you do have an infection.  Based on this, we need to start Keflex 500mg  twice daily x5 days (#10, no refills)

## 2023-11-01 NOTE — Progress Notes (Deleted)
 Cardiology Office Note:    Date:  11/01/2023   ID:  [name], DOB [birth year], MRN [id number]  PCP:  Sheliah Hatch, MD   [facility] Providers Cardiologist:  Thurmon Fair, MD Cardiology APP:  Marcelino Duster, PA { Click to update primary MD,subspecialty MD or APP then REFRESH:1}    Referring MD: Sheliah Hatch, MD   No chief complaint on file. ***  History of Present Illness:    Colleen Wright is a 83 y.o. female with a hx of  Colleen Wright is a 83 y.o. female with a hx of paroxysmal atrial fibrillation on chronic anticoagulation, cryptogenic stroke, hypertension, CAD, carotid artery stenosis, and hyperlipidemia.  She suffered a stroke which led to implantable loop recorder which detected asymptomatic atrial fibrillation.  She is anticoagulated with Eliquis 5 mg twice daily.  Hypertension is controlled with 50 mg Toprol and losartan-HCTZ.  Hyperlipidemia is controlled with 40 mg atorvastatin.  Carotid artery duplex in December 2020 showed 50 to 69% stenosis in the left ICA.  Loop recorder has since reached end of service.  However, while functional the ILR showed relatively low A. fib burden.  Left heart catheterization in June 2017 showed moderate to severe RCA branch stenosis that was treated with medical therapy due to tortuosity of the proximal vessel.  She was hospitalized 04/2020 with vertigo and falls. She was unable to care for herself at home.  I saw her in clinic 05/05/21 and she reported an episode at a restaurant in which she could not speak for 10 min but did not seek help. She also suspected her cholesterol medicine was causing her dizziness, but was not interested in changing medications (e.g., adding zetia) at that time. She also reported only taking eliquis once per day due to cost. I provided patient assistance.  She reported atypical chest pain but did not want to pursue ischemic evaluation at that time.  She has been maintained on Eliquis and  has struggled with the cost.  She called our office wishing to be seen and to be switched to Coumadin.  She was added to my schedule.     PAF Chronic anticoagulation Hx of CVA Possible TIA ?04/2021 This patients CHA2DS2-VASc Score and unadjusted Ischemic Stroke Rate (% per year) is equal to 11.2 % stroke rate/year from a score of 7 (1age, female, HTN, CAD, CVA2) - anticoagulated with eliquis     CAD - treated medically - no ASA in the setting of eliquis - continue BB and statin   Hyperlipidemia - LDL 74 on 11/2019 - continue statin, she did not want to try to add zetia in an attempt to reduce dose of satin 05/16/2021: Cholesterol 138; HDL 45.00; LDL Cholesterol 68; Triglycerides 125.0; VLDL 25.0  Typically would be due for an updated lipid panel, would consider this in light of her medically managed CAD and CVA    Carotid artery stenosis - last duplex 07/2019 - given possible TIA in 04/2021, I repeated carotid dopplers which showed 1-39% stenosis in the right ICA and 40-59% stenosis in the left ICA, unchanged from 2020 study - continue risk factor management          Past Medical History:  Diagnosis Date   Adenomatous colon polyp 2006   Allergy    Arthritis    low back   Back pain 06/13/2011   CAD (coronary artery disease) 06/12/2011   Depression    GERD (gastroesophageal reflux disease)    Hiatal  hernia    Hyperlipidemia    Hypertension    Hypothyroidism 06/12/2011   Leg fracture    Pain in joint, ankle and foot 11/02/2012   Pain of left heel 05/23/2012   Skin lesions, generalized 05/23/2012   Thyroid cancer (HCC)    Ulcer    Vertigo 06/13/2011    Past Surgical History:  Procedure Laterality Date   BALLOON ANGIOPLASTY, ARTERY     CARDIAC CATHETERIZATION N/A 01/24/2016   Procedure: Left Heart Cath and Coronary Angiography;  Surgeon: Peter M Swaziland, MD;  Location: [facility] INVASIVE CV LAB;  Service: Cardiovascular;  Laterality: N/A;   EP IMPLANTABLE DEVICE N/A  09/24/2015   Procedure: Loop Recorder Insertion;  Surgeon: Thurmon Fair, MD;  Location: [facility] INVASIVE CV LAB;  Service: Cardiovascular;  Laterality: N/A;   FRACTURE SURGERY     left leg, pin and 5 bolts in place in lower tibia   LESION REMOVAL     vaginal   THYROIDECTOMY, PARTIAL     twice   TUBAL LIGATION     growth removed from posterior vaginal vault    Current Medications: No outpatient medications have been marked as taking for the 11/03/23 encounter (Appointment) with Marcelino Duster, PA.     Allergies:   Amoxicillin   Social History   Socioeconomic History   Marital status: Married    Spouse name: Not on file   Number of children: Not on file   Years of education: Not on file   Highest education level: Not on file  Occupational History   Not on file  Tobacco Use   Smoking status: Former    Current packs/day: 0.00    Average packs/day: 0.3 packs/day for 5.0 years (1.3 ttl pk-yrs)    Types: Cigarettes    Start date: 11/16/1963    Quit date: 11/15/1968    Years since quitting: 54.9   Smokeless tobacco: Never  Vaping Use   Vaping status: Never Used  Substance and Sexual Activity   Alcohol use: No    Alcohol/week: 0.0 standard drinks of alcohol   Drug use: No   Sexual activity: Not Currently  Other Topics Concern   Not on file  Social History Narrative   Not on file   Social Drivers of Health   Financial Resource Strain: Low Risk  (05/20/2023)   Overall Financial Resource Strain (CARDIA)    Difficulty of Paying Living Expenses: Not hard at all  Food Insecurity: No Food Insecurity (05/20/2023)   Hunger Vital Sign    Worried About Running Out of Food in the Last Year: Never true    Ran Out of Food in the Last Year: Never true  Transportation Needs: No Transportation Needs (05/20/2023)   PRAPARE - Administrator, Civil Service (Medical): No    Lack of Transportation (Non-Medical): No  Physical Activity: Inactive (05/20/2023)   Exercise Vital Sign     Days of Exercise per Week: 0 days    Minutes of Exercise per Session: 0 min  Stress: No Stress Concern Present (05/20/2023)   [employer] of Occupational Health - Occupational Stress Questionnaire    Feeling of Stress : Not at all  Social Connections: Moderately Integrated (05/20/2023)   Social Connection and Isolation Panel [NHANES]    Frequency of Communication with Friends and Family: Twice a week    Frequency of Social Gatherings with Friends and Family: Three times a week    Attends Religious Services: More than 4 times per year  Active Member of Clubs or Organizations: No    Attends Banker Meetings: Never    Marital Status: Married     Family History: The patient's ***family history includes Cancer in her cousin, mother, and sister; Heart disease in her brother, father, and sister; Hypertension in her sister; Stroke in her mother and sister.  ROS:   Please see the history of present illness.    *** All other systems reviewed and are negative.  EKGs/Labs/Other Studies Reviewed:    The following studies were reviewed today: ***      Recent Labs: 10/22/2023: ALT 22; BUN 22; Creatinine, Ser 1.25; Hemoglobin 13.3; Platelets 187.0; Potassium 3.6; Sodium 139; TSH 1.89  Recent Lipid Panel    Component Value Date/Time   CHOL 132 10/22/2023 1332   TRIG 140.0 10/22/2023 1332   HDL 49.20 10/22/2023 1332   CHOLHDL 3 10/22/2023 1332   VLDL 28.0 10/22/2023 1332   LDLCALC 55 10/22/2023 1332   LDLCALC 56 04/23/2023 1507     Risk Assessment/Calculations:   {Does this patient have ATRIAL FIBRILLATION?[phone]}  No BP recorded.  {Refresh Note OR Click here to enter BP  :1}***         Physical Exam:    VS:  There were no vitals taken for this visit.    Wt Readings from Last 3 Encounters:  10/22/23 154 lb (69.9 kg)  04/23/23 161 lb 8 oz (73.3 kg)  12/07/22 165 lb 3.2 oz (74.9 kg)     GEN: *** Well nourished, well developed in no acute distress HEENT:  Normal NECK: No JVD; No carotid bruits LYMPHATICS: No lymphadenopathy CARDIAC: ***RRR, no murmurs, rubs, gallops RESPIRATORY:  Clear to auscultation without rales, wheezing or rhonchi  ABDOMEN: Soft, non-tender, non-distended MUSCULOSKELETAL:  No edema; No deformity  SKIN: Warm and dry NEUROLOGIC:  Alert and oriented x 3 PSYCHIATRIC:  Normal affect   ASSESSMENT:    No diagnosis found. PLAN:    In order of problems listed above:  ***      {Are you ordering a CV Procedure (e.g. stress test, cath, DCCV, TEE, etc)?   Press F2        :[id number]}    Medication Adjustments/Labs and Tests Ordered: Current medicines are reviewed at length with the patient today.  Concerns regarding medicines are outlined above.  No orders of the defined types were placed in this encounter.  No orders of the defined types were placed in this encounter.   There are no Patient Instructions on file for this visit.   Signed, Marcelino Duster, [place]  11/01/2023 3:23 PM     [facility]

## 2023-11-03 ENCOUNTER — Ambulatory Visit: Payer: Medicare Other | Admitting: Physician Assistant

## 2023-11-30 ENCOUNTER — Other Ambulatory Visit: Payer: Self-pay

## 2023-11-30 DIAGNOSIS — E785 Hyperlipidemia, unspecified: Secondary | ICD-10-CM

## 2023-11-30 MED ORDER — ROSUVASTATIN CALCIUM 20 MG PO TABS: 20.0000 mg | ORAL_TABLET | Freq: Every day | ORAL | 0 refills | Status: DC

## 2023-12-03 ENCOUNTER — Other Ambulatory Visit: Payer: Self-pay | Admitting: Family Medicine

## 2023-12-17 NOTE — Progress Notes (Unsigned)
 Cardiology Office Note:    Date:  12/20/2023   ID:  Colleen Wright, DOB 18-Apr-1941, MRN [id number]  PCP:  Jess Morita, MD   [facility] Providers Cardiologist:  Luana Rumple, MD Cardiology APP:  Lamond Pilot, PA     Referring MD: Jess Morita, MD   Chief Complaint  Patient presents with   Follow-up    Discuss switching to coumadin    History of Present Illness:    Colleen Wright is a 83 y.o. female with a hx of paroxysmal atrial fibrillation on chronic anticoagulation, cryptogenic stroke, hypertension, CAD, carotid artery stenosis, and hyperlipidemia.  She suffered a stroke which led to implantable loop recorder which detected asymptomatic atrial fibrillation.  She is anticoagulated with Eliquis  5 mg twice daily.  Hypertension is controlled with 50 mg Toprol  and losartan -HCTZ.  Hyperlipidemia is controlled with 40 mg atorvastatin .  Carotid artery duplex in December 2020 showed 50 to 69% stenosis in the left ICA.  Loop recorder has since reached end of service.  However, while functional the ILR showed relatively low A. fib burden.  Left heart catheterization in June 2017 showed moderate to severe RCA branch stenosis that was treated with medical therapy due to tortuosity of the proximal vessel.  She was hospitalized 04/2020 with vertigo and falls. She was unable to care for herself at home.  I saw her in clinic 05/05/21 and she reported an episode at a restaurant in which she could not speak for 10 min but did not seek help. She also suspected her cholesterol medicine was causing her dizziness, but was not interested in changing medications (e.g., adding zetia) at that time. She also reported only taking eliquis  once per day due to cost. I provided patient assistance.  She reported atypical chest pain but did not want to pursue ischemic evaluation at that time.  I saw her for routine follow-up 09/11/2021 and again provided patient assistance for Eliquis  so that  she can take this twice daily.  We also discussed Coumadin.  I did discuss with Pharm.D. and she would qualify for free Eliquis  after she has spent 3% of income), which includes her husband's income and medication costs.  I last saw her 11/2022. She called our office to make an appt to switch to coumadin.   We had a long discussion regarding coumadin and her intake of greens. She does not want to take coumadin. She reports intermittent dizziness. BP marginal today. She does not take her BP at home.  She is still struggling with dysuria.   I will stop losartan  and continue 12.5 mg hydrochlorothiazide  alone.    Past Medical History:  Diagnosis Date   Adenomatous colon polyp 2006   Allergy    Arthritis    low back   Back pain 06/13/2011   CAD (coronary artery disease) 06/12/2011   Depression    GERD (gastroesophageal reflux disease)    Hiatal hernia    Hyperlipidemia    Hypertension    Hypothyroidism 06/12/2011   Leg fracture    Pain in joint, ankle and foot 11/02/2012   Pain of left heel 05/23/2012   Skin lesions, generalized 05/23/2012   Thyroid  cancer (HCC)    Ulcer    Vertigo 06/13/2011    Past Surgical History:  Procedure Laterality Date   BALLOON ANGIOPLASTY, ARTERY     CARDIAC CATHETERIZATION N/A 01/24/2016   Procedure: Left Heart Cath and Coronary Angiography;  Surgeon: Peter M Swaziland, MD;  Location: [facility]  INVASIVE CV LAB;  Service: Cardiovascular;  Laterality: N/A;   EP IMPLANTABLE DEVICE N/A 09/24/2015   Procedure: Loop Recorder Insertion;  Surgeon: Luana Rumple, MD;  Location: [facility] INVASIVE CV LAB;  Service: Cardiovascular;  Laterality: N/A;   FRACTURE SURGERY     left leg, pin and 5 bolts in place in lower tibia   LESION REMOVAL     vaginal   THYROIDECTOMY, PARTIAL     twice   TUBAL LIGATION     growth removed from posterior vaginal vault    Current Medications: Current Meds  Medication Sig   ALPRAZolam  (XANAX ) 0.5 MG tablet TAKE 1 TABLET BY MOUTH EVERYDAY AT BEDTIME    apixaban  (ELIQUIS ) 5 MG TABS tablet Take 1 tablet (5 mg total) by mouth 2 (two) times daily.   azelastine  (ASTELIN ) 0.1 % nasal spray Place 1 spray into both nostrils 2 (two) times daily. Use in each nostril as directed   ELIQUIS  5 MG TABS tablet TAKE 1 TABLET BY MOUTH TWICE A DAY   hydrochlorothiazide  (MICROZIDE ) 12.5 MG capsule Take 1 capsule (12.5 mg total) by mouth daily.   levothyroxine  (SYNTHROID ) 125 MCG tablet Take 1 tablet (125 mcg total) by mouth daily.   metoprolol  succinate (TOPROL -XL) 50 MG 24 hr tablet Take 0.5 tablets (25 mg total) by mouth 2 (two) times daily. Take with or immediately following a meal.   rosuvastatin  (CRESTOR ) 20 MG tablet Take 1 tablet (20 mg total) by mouth daily.   [DISCONTINUED] losartan -hydrochlorothiazide  (HYZAAR) 50-12.5 MG tablet TAKE 1 TABLET BY MOUTH EVERY DAY     Allergies:   Amoxicillin   Social History   Socioeconomic History   Marital status: Married    Spouse name: Not on file   Number of children: Not on file   Years of education: Not on file   Highest education level: Not on file  Occupational History   Not on file  Tobacco Use   Smoking status: Former    Current packs/day: 0.00    Average packs/day: 0.3 packs/day for 5.0 years (1.3 ttl pk-yrs)    Types: Cigarettes    Start date: 11/16/1963    Quit date: 11/15/1968    Years since quitting: 55.1   Smokeless tobacco: Never  Vaping Use   Vaping status: Never Used  Substance and Sexual Activity   Alcohol use: No    Alcohol/week: 0.0 standard drinks of alcohol   Drug use: No   Sexual activity: Not Currently  Other Topics Concern   Not on file  Social History Narrative   Not on file   Social Drivers of Health   Financial Resource Strain: Low Risk  (05/20/2023)   Overall Financial Resource Strain (CARDIA)    Difficulty of Paying Living Expenses: Not hard at all  Food Insecurity: No Food Insecurity (05/20/2023)   Hunger Vital Sign    Worried About Running Out of Food in the Last  Year: Never true    Ran Out of Food in the Last Year: Never true  Transportation Needs: No Transportation Needs (05/20/2023)   PRAPARE - Administrator, Civil Service (Medical): No    Lack of Transportation (Non-Medical): No  Physical Activity: Inactive (05/20/2023)   Exercise Vital Sign    Days of Exercise per Week: 0 days    Minutes of Exercise per Session: 0 min  Stress: No Stress Concern Present (05/20/2023)   [employer] of Occupational Health - Occupational Stress Questionnaire    Feeling of Stress : Not  at all  Social Connections: Moderately Integrated (05/20/2023)   Social Connection and Isolation Panel [NHANES]    Frequency of Communication with Friends and Family: Twice a week    Frequency of Social Gatherings with Friends and Family: Three times a week    Attends Religious Services: More than 4 times per year    Active Member of Clubs or Organizations: No    Attends Banker Meetings: Never    Marital Status: Married     Family History: The patient's family history includes Cancer in her cousin, mother, and sister; Heart disease in her brother, father, and sister; Hypertension in her sister; Stroke in her mother and sister.  ROS:   Please see the history of present illness.     All other systems reviewed and are negative.  EKGs/Labs/Other Studies Reviewed:    The following studies were reviewed today:  EKG Interpretation Date/Time:  Monday Dec 20 2023 13:52:40 EDT Ventricular Rate:  61 PR Interval:  128 QRS Duration:  84 QT Interval:  426 QTC Calculation: 428 R Axis:   -9  Text Interpretation: Normal sinus rhythm Normal ECG When compared with ECG of 31-May-2021 15:11, Questionable change in QRS axis Confirmed by Marcie Sever ([id number]) on 12/20/2023 2:00:24 PM    Recent Labs: 10/22/2023: ALT 22; BUN 22; Creatinine, Ser 1.25; Hemoglobin 13.3; Platelets 187.0; Potassium 3.6; Sodium 139; TSH 1.89  Recent Lipid Panel    Component Value  Date/Time   CHOL 132 10/22/2023 1332   TRIG 140.0 10/22/2023 1332   HDL 49.20 10/22/2023 1332   CHOLHDL 3 10/22/2023 1332   VLDL 28.0 10/22/2023 1332   LDLCALC 55 10/22/2023 1332   LDLCALC 56 04/23/2023 1507     Risk Assessment/Calculations:    CHA2DS2-VASc Score = 7   This indicates a 11.2% annual risk of stroke. The patient's score is based upon: CHF History: 0 HTN History: 1 Diabetes History: 0 Stroke History: 2 Vascular Disease History: 1 Age Score: 2 Gender Score: 1               Physical Exam:    VS:  BP 98/61 (BP Location: Right Arm, Patient Position: Sitting, Cuff Size: Normal)   Pulse 66   Ht 5\' 2"  (1.575 m)   Wt 154 lb 9.6 oz (70.1 kg)   SpO2 95%   BMI 28.28 kg/m     Wt Readings from Last 3 Encounters:  12/20/23 154 lb 9.6 oz (70.1 kg)  10/22/23 154 lb (69.9 kg)  04/23/23 161 lb 8 oz (73.3 kg)     GEN:  Well nourished, well developed in no acute distress HEENT: Normal NECK: No JVD; No carotid bruits LYMPHATICS: No lymphadenopathy CARDIAC: RRR, no murmurs, rubs, gallops RESPIRATORY:  Clear to auscultation without rales, wheezing or rhonchi  ABDOMEN: Soft, non-tender, non-distended MUSCULOSKELETAL:  No edema; No deformity  SKIN: Warm and dry NEUROLOGIC:  Alert and oriented x 3 PSYCHIATRIC:  Normal affect   ASSESSMENT:    1. Essential hypertension   2. Coronary artery disease of native artery of native heart with stable angina pectoris (HCC)   3. Paroxysmal atrial fibrillation (HCC)   4. Cryptogenic stroke (HCC)   5. Cerebrovascular disease   6. Chronic anticoagulation   7. Hyperlipidemia with target LDL less than 70    PLAN:    In order of problems listed above:   Hypertension - BP marginal - stop hyzaar - start 12.5 mg hydrochlorothiazide  alone   PAF - EKG today with  -  on 12.5 mg toprol    Chronic anticoagulation Hx of CVA Possible TIA ?04/2021 This patients CHA2DS2-VASc Score and unadjusted Ischemic Stroke Rate (% per  year) is equal to 11.2 % stroke rate/year from a score of 7 (1age, female, HTN, CAD, CVA2) - anticoagulated with eliquis  - some noncompliance due to cost - long discussion regarding properly taking eliquis    CAD - treated medically - no ASA in the setting of eliquis  - continue BB and statin   Hyperlipidemia - LDL 74 on 11/2019 - continue statin, she did not want to try to add zetia in an attempt to reduce dose of satin 05/16/2021: Cholesterol 138; HDL 45.00; LDL Cholesterol 68; Triglycerides 125.0; VLDL 25.0  Typically would be due for an updated lipid panel, would consider this in light of her medically managed CAD and CVA   Carotid artery stenosis - last duplex 07/2019 - given possible TIA in 04/2021, I repeated carotid dopplers which showed 1-39% stenosis in the right ICA and 40-59% stenosis in the left ICA, unchanged from 2020 study - continue risk factor management   She will follow up with Dr. Paulla Bossier to see if she can follow her INR. She does not want to transition today.  If she transitions to coumadin, recommend starting 5 mg coumadin at night, continue eliquis  for 3 days, then INR appt 4-5 days.  Follow up in 6 months.          Medication Adjustments/Labs and Tests Ordered: Current medicines are reviewed at length with the patient today.  Concerns regarding medicines are outlined above.  Orders Placed This Encounter  Procedures   EKG 12-Lead   Meds ordered this encounter  Medications   hydrochlorothiazide  (MICROZIDE ) 12.5 MG capsule    Sig: Take 1 capsule (12.5 mg total) by mouth daily.    Dispense:  45 capsule    Refill:  3   apixaban  (ELIQUIS ) 5 MG TABS tablet    Sig: Take 1 tablet (5 mg total) by mouth 2 (two) times daily.    Dispense:  28 tablet    Refill:  0    Patient Instructions  Medication Instructions:  STOP HYZAAR  START HYDROCHLOROTHIAZIDE  12.5 MG DAILY *If you need a refill on your cardiac medications before your next appointment, please call  your pharmacy*  Lab Work: NO LABS If you have labs (blood work) drawn today and your tests are completely normal, you will receive your results only by: MyChart Message (if you have MyChart) OR A paper copy in the mail If you have any lab test that is abnormal or we need to change your treatment, we will call you to review the results.  Testing/Procedures: NO TESTING  Follow-Up: At [facility], you and your health needs are our priority.  As part of our continuing mission to provide you with exceptional heart care, our providers are all part of one team.  This team includes your primary Cardiologist (physician) and Advanced Practice Providers or APPs (Physician Assistants and Nurse Practitioners) who all work together to provide you with the care you need, when you need it.  Your next appointment:   6 month(s)  Provider:   Luana Rumple, MD    Signed, Warren Haber Kaycee Haycraft, [place]  12/20/2023 2:53 PM    [facility]

## 2023-12-20 ENCOUNTER — Ambulatory Visit: Attending: Physician Assistant | Admitting: Physician Assistant

## 2023-12-20 ENCOUNTER — Encounter: Payer: Self-pay | Admitting: Physician Assistant

## 2023-12-20 VITALS — BP 98/61 | HR 66 | Ht 62.0 in | Wt 154.6 lb

## 2023-12-20 DIAGNOSIS — I25118 Atherosclerotic heart disease of native coronary artery with other forms of angina pectoris: Secondary | ICD-10-CM

## 2023-12-20 DIAGNOSIS — Z7901 Long term (current) use of anticoagulants: Secondary | ICD-10-CM

## 2023-12-20 DIAGNOSIS — I639 Cerebral infarction, unspecified: Secondary | ICD-10-CM | POA: Diagnosis not present

## 2023-12-20 DIAGNOSIS — I480 Paroxysmal atrial fibrillation: Secondary | ICD-10-CM | POA: Diagnosis not present

## 2023-12-20 DIAGNOSIS — I10 Essential (primary) hypertension: Secondary | ICD-10-CM

## 2023-12-20 DIAGNOSIS — E785 Hyperlipidemia, unspecified: Secondary | ICD-10-CM

## 2023-12-20 DIAGNOSIS — I679 Cerebrovascular disease, unspecified: Secondary | ICD-10-CM

## 2023-12-20 MED ORDER — APIXABAN 5 MG PO TABS: 5.0000 mg | ORAL_TABLET | Freq: Two times a day (BID) | ORAL | 0 refills | Status: DC

## 2023-12-20 MED ORDER — HYDROCHLOROTHIAZIDE 12.5 MG PO CAPS: 12.5000 mg | ORAL_CAPSULE | Freq: Every day | ORAL | 3 refills | Status: DC

## 2023-12-20 NOTE — Patient Instructions (Signed)
 Medication Instructions:  STOP HYZAAR  START HYDROCHLOROTHIAZIDE  12.5 MG DAILY *If you need a refill on your cardiac medications before your next appointment, please call your pharmacy*  Lab Work: NO LABS If you have labs (blood work) drawn today and your tests are completely normal, you will receive your results only by: MyChart Message (if you have MyChart) OR A paper copy in the mail If you have any lab test that is abnormal or we need to change your treatment, we will call you to review the results.  Testing/Procedures: NO TESTING  Follow-Up: At [facility], you and your health needs are our priority.  As part of our continuing mission to provide you with exceptional heart care, our providers are all part of one team.  This team includes your primary Cardiologist (physician) and Advanced Practice Providers or APPs (Physician Assistants and Nurse Practitioners) who all work together to provide you with the care you need, when you need it.  Your next appointment:   6 month(s)  Provider:   Luana Rumple, MD

## 2024-02-28 ENCOUNTER — Other Ambulatory Visit: Payer: Self-pay | Admitting: Family Medicine

## 2024-02-28 NOTE — Telephone Encounter (Signed)
 Requested Prescriptions   Pending Prescriptions Disp Refills   ALPRAZolam  (XANAX ) 0.5 MG tablet [Pharmacy Med Name: ALPRAZOLAM  0.5 MG TABLET] 30 tablet 1    Sig: TAKE 1 TABLET BY MOUTH EVERYDAY AT BEDTIME     Date of patient request: 02/28/2024 Last office visit: 10/22/2023 Upcoming visit: Visit date not found Date of last refill: 12/06/2023 Last refill amount: 30x1

## 2024-03-02 NOTE — Telephone Encounter (Signed)
 Erroneous encounter

## 2024-04-05 ENCOUNTER — Other Ambulatory Visit: Payer: Self-pay | Admitting: Family Medicine

## 2024-04-05 DIAGNOSIS — I10 Essential (primary) hypertension: Secondary | ICD-10-CM

## 2024-04-05 NOTE — Telephone Encounter (Signed)
 According to cardiology note from 12/20/23. Patient was to stop hyzaar and start 12.5 mg hydrochlorothiazide  alone

## 2024-04-12 ENCOUNTER — Telehealth: Payer: Self-pay | Admitting: Cardiovascular Disease

## 2024-04-12 DIAGNOSIS — E785 Hyperlipidemia, unspecified: Secondary | ICD-10-CM

## 2024-04-12 MED ORDER — ROSUVASTATIN CALCIUM 20 MG PO TABS: 20.0000 mg | ORAL_TABLET | Freq: Every day | ORAL | 3 refills | Status: AC

## 2024-04-12 NOTE — Telephone Encounter (Signed)
 Pt c/o medication issue:  1. Name of Medication:   rosuvastatin  (CRESTOR ) 20 MG tablet    2. How are you currently taking this medication (dosage and times per day)?  Take 1 tablet (20 mg total) by mouth daily.     3. Are you having a reaction (difficulty breathing--STAT)? No  4. What is your medication issue? Pt's daughter in law is requesting a callback regarding [facility] telling them they've sent over a PA for this medication 2 days and they've been waiting to hear something back since pt has been out of medication since Monday. She'd like to check the status of it. Please advise

## 2024-04-12 NOTE — Telephone Encounter (Signed)
 Per chart review, refills of crestor  needed. Last sent in 11/2023. Last appt 12/2023. Rx(s) sent to pharmacy electronically. LM for [place] with this info.

## 2024-04-14 ENCOUNTER — Other Ambulatory Visit: Payer: Self-pay | Admitting: Family Medicine

## 2024-04-14 DIAGNOSIS — R7989 Other specified abnormal findings of blood chemistry: Secondary | ICD-10-CM

## 2024-05-15 ENCOUNTER — Ambulatory Visit (INDEPENDENT_AMBULATORY_CARE_PROVIDER_SITE_OTHER): Admitting: Family Medicine

## 2024-05-15 ENCOUNTER — Telehealth: Payer: Self-pay | Admitting: Cardiovascular Disease

## 2024-05-15 ENCOUNTER — Encounter: Payer: Self-pay | Admitting: Family Medicine

## 2024-05-15 VITALS — BP 92/50 | HR 86 | Temp 98.0°F | Resp 16 | Ht 62.0 in | Wt 145.0 lb

## 2024-05-15 DIAGNOSIS — R5383 Other fatigue: Secondary | ICD-10-CM

## 2024-05-15 DIAGNOSIS — R110 Nausea: Secondary | ICD-10-CM

## 2024-05-15 DIAGNOSIS — R079 Chest pain, unspecified: Secondary | ICD-10-CM | POA: Diagnosis not present

## 2024-05-15 DIAGNOSIS — R634 Abnormal weight loss: Secondary | ICD-10-CM | POA: Diagnosis not present

## 2024-05-15 DIAGNOSIS — Z85828 Personal history of other malignant neoplasm of skin: Secondary | ICD-10-CM | POA: Diagnosis not present

## 2024-05-15 DIAGNOSIS — R0609 Other forms of dyspnea: Secondary | ICD-10-CM

## 2024-05-15 DIAGNOSIS — R1030 Lower abdominal pain, unspecified: Secondary | ICD-10-CM

## 2024-05-15 DIAGNOSIS — L570 Actinic keratosis: Secondary | ICD-10-CM | POA: Diagnosis not present

## 2024-05-15 DIAGNOSIS — L821 Other seborrheic keratosis: Secondary | ICD-10-CM | POA: Diagnosis not present

## 2024-05-15 DIAGNOSIS — I25118 Atherosclerotic heart disease of native coronary artery with other forms of angina pectoris: Secondary | ICD-10-CM | POA: Diagnosis not present

## 2024-05-15 DIAGNOSIS — N390 Urinary tract infection, site not specified: Secondary | ICD-10-CM | POA: Diagnosis not present

## 2024-05-15 LAB — POCT URINALYSIS DIP (MANUAL ENTRY)
Bilirubin, UA: NEGATIVE
Glucose, UA: NEGATIVE mg/dL
Ketones, POC UA: NEGATIVE mg/dL
Nitrite, UA: NEGATIVE
Protein Ur, POC: 30 mg/dL — AB
Spec Grav, UA: 1.015 (ref 1.010–1.025)
Urobilinogen, UA: 0.2 U/dL
pH, UA: 6 (ref 5.0–8.0)

## 2024-05-15 LAB — COMPREHENSIVE METABOLIC PANEL WITH GFR
ALT: 17 U/L (ref 0–35)
AST: 27 U/L (ref 0–37)
Albumin: 4.3 g/dL (ref 3.5–5.2)
Alkaline Phosphatase: 73 U/L (ref 39–117)
BUN: 34 mg/dL — ABNORMAL HIGH (ref 6–23)
CO2: 28 meq/L (ref 19–32)
Calcium: 10.6 mg/dL — ABNORMAL HIGH (ref 8.4–10.5)
Chloride: 96 meq/L (ref 96–112)
Creatinine, Ser: 1.61 mg/dL — ABNORMAL HIGH (ref 0.40–1.20)
GFR: 29.07 mL/min — ABNORMAL LOW (ref >60.00)
Glucose, Bld: 160 mg/dL — ABNORMAL HIGH (ref 70–99)
Potassium: 2.9 meq/L — ABNORMAL LOW (ref 3.5–5.1)
Sodium: 137 meq/L (ref 135–145)
Total Bilirubin: 0.6 mg/dL (ref 0.2–1.2)
Total Protein: 7.8 g/dL (ref 6.0–8.3)

## 2024-05-15 LAB — CBC
HCT: 38.3 % (ref 36.0–46.0)
Hemoglobin: 13.2 g/dL (ref 12.0–15.0)
MCHC: 34.6 g/dL (ref 30.0–36.0)
MCV: 85 fl (ref 78.0–100.0)
Platelets: 201 K/uL (ref 150.0–400.0)
RBC: 4.5 Mil/uL (ref 3.87–5.11)
RDW: 13.2 % (ref 11.5–15.5)
WBC: 7.7 K/uL (ref 4.0–10.5)

## 2024-05-15 LAB — LIPASE: Lipase: 41 U/L (ref 11.0–59.0)

## 2024-05-15 NOTE — Progress Notes (Unsigned)
 Subjective:  Patient ID: Colleen Wright, female    DOB: [birth year]  Age: 83 y.o. MRN: [id number]  CC:  Chief Complaint  Patient presents with   Fatigue    Weak. Sx started a long time ago. Getting worse within the last month. Feels sick on her stomach. Does not throw up.    Acute Visit    Patient is having burning across her abdomen. Sx started awhile ago.     HPI VUNG KUSH presents for   Acute visit for above, PCP is Dr. Mahlon. Here with dtr in law today.   Fatigue, generalized weakness Longstanding symptoms  - feeling overall weak over the past year, but feels like symptoms have worsened over the past couple of months. Has not discussed with PCP recently. Just minimal energy. Has been having trouble walking from kitchen to bathroom without sitting down. Feels dizzy at times, short of breath at times. Some chest pains at times. Intermittent chest pains - not daily - unable to specify frequency further. Has blockages in neck and heart - last saw cardiology in May.  Note indicates history of paroxysmal atrial fibrillation on chronic anticoagulation, cryptogenic stroke, hypertension, CAD, coronary artery stenosis hyperlipidemia.  Chart reviewed, she does have a history of hypothyroidism,Last TSH 1.89 on March 7.  UTI with E. coli in March.  Problem list indicates recurrent UTI.  She was treated with Keflex  500 mg twice daily for 5 days in March. More progressively winded with activity.  No focal weakness, slurred speech.  Burning with urination - past 3-4 months. Temporary improvement after abx in March, then returned.  No recent fever.  Nausea or sick on stomach for months.  No acute change in sx's past few weeks - noted worsening past few months.  Weight has been declining - eating, but not much appetite.  1 bottle of water per day, some tea.  Medical treatment of CAD.  Left heart catheterization in June 2017 showed moderate to severe RCA branch stenosis that was treated with  medical therapy due to tortuosity of the proximal vessel per cardiology note in May.   Losartan  was discontinued and continue just 12.5 mg hydrochlorothiazide  for marginal BP at her May 5 visit.   Wt Readings from Last 3 Encounters:  05/15/24 145 lb (65.8 kg)  12/20/23 154 lb 9.6 oz (70.1 kg)  10/22/23 154 lb (69.9 kg)   Cardiologist:  Jerel Balding, MD Cardiology APP:  Madie Jon Garre, PA    History Patient Active Problem List   Diagnosis Date Noted   Hypothyroid 10/22/2023   Lumbar radiculopathy 10/21/2023   Lumbar degenerative disc disease 05/16/2021   Recurrent UTI 05/14/2020   Vertigo 05/11/2020   Allergic fungal sinusitis 05/11/2020   Allergic rhinitis 05/01/2019   Long term (current) use of anticoagulants 09/22/2018   Carotid artery stenosis 12/03/2016   Overweight (BMI 25.0-29.9) 05/06/2016   Chest pain with high risk for cardiac etiology 01/24/2016   Paroxysmal atrial fibrillation (HCC) 01/08/2016   History of colonic polyps 12/12/2015   Cryptogenic stroke (HCC) 09/13/2015   Insomnia 08/10/2015   Vitamin D  deficiency 06/21/2015   B12 deficiency 09/25/2013   Cerebrovascular disease 06/13/2011   Essential hypertension 06/13/2011   Hyperlipidemia    Postsurgical hypothyroidism    Coronary artery disease of native artery of native heart with stable angina pectoris    Past Medical History:  Diagnosis Date   Adenomatous colon polyp 2006   Allergy    Arthritis    low  back   Back pain 06/13/2011   CAD (coronary artery disease) 06/12/2011   Depression    GERD (gastroesophageal reflux disease)    Hiatal hernia    Hyperlipidemia    Hypertension    Hypothyroidism 06/12/2011   Leg fracture    Pain in joint, ankle and foot 11/02/2012   Pain of left heel 05/23/2012   Skin lesions, generalized 05/23/2012   Thyroid  cancer (HCC)    Ulcer    Vertigo 06/13/2011   Past Surgical History:  Procedure Laterality Date   BALLOON ANGIOPLASTY, ARTERY     CARDIAC  CATHETERIZATION N/A 01/24/2016   Procedure: Left Heart Cath and Coronary Angiography;  Surgeon: Peter M Swaziland, MD;  Location: [facility] INVASIVE CV LAB;  Service: Cardiovascular;  Laterality: N/A;   EP IMPLANTABLE DEVICE N/A 09/24/2015   Procedure: Loop Recorder Insertion;  Surgeon: Jerel Balding, MD;  Location: [facility] INVASIVE CV LAB;  Service: Cardiovascular;  Laterality: N/A;   FRACTURE SURGERY     left leg, pin and 5 bolts in place in lower tibia   LESION REMOVAL     vaginal   THYROIDECTOMY, PARTIAL     twice   TUBAL LIGATION     growth removed from posterior vaginal vault   Allergies  Allergen Reactions   Amoxicillin Other (See Comments)    Patient develops a very bad yeast infection. Did it involve swelling of the face/tongue/throat, SOB, or low BP? No Did it involve sudden or severe rash/hives, skin peeling, or any reaction on the inside of your mouth or nose? No Did you need to seek medical attention at a hospital or doctor's office? No When did it last happen?      many years If all above answers are "NO", may proceed with cephalosporin use.    Prior to Admission medications   Medication Sig Start Date End Date Taking? Authorizing Provider  ALPRAZolam  (XANAX ) 0.5 MG tablet TAKE 1 TABLET BY MOUTH EVERYDAY AT BEDTIME 02/28/24  Yes Webb, Padonda B, FNP  apixaban  (ELIQUIS ) 5 MG TABS tablet Take 1 tablet (5 mg total) by mouth 2 (two) times daily. Patient taking differently: Take 5 mg by mouth daily. 12/20/23  Yes Duke, Jon Garre, PA  azelastine  (ASTELIN ) 0.1 % nasal spray Place 1 spray into both nostrils 2 (two) times daily. Use in each nostril as directed 06/06/21  Yes Kip Ade, NP  ELIQUIS  5 MG TABS tablet TAKE 1 TABLET BY MOUTH TWICE A DAY Patient taking differently: Take 5 mg by mouth daily. 06/15/23  Yes Duke, Jon Garre, PA  hydrochlorothiazide  (MICROZIDE ) 12.5 MG capsule Take 1 capsule (12.5 mg total) by mouth daily. 12/20/23 05/15/24 Yes Duke, Jon Garre, PA  levothyroxine   (SYNTHROID ) 125 MCG tablet TAKE 1 TABLET BY MOUTH EVERY DAY 04/14/24  Yes Tabori, Katherine E, MD  metoprolol  succinate (TOPROL -XL) 50 MG 24 hr tablet Take 0.5 tablets (25 mg total) by mouth 2 (two) times daily. Take with or immediately following a meal. 10/11/23  Yes Duke, Jon Garre, PA  nitroGLYCERIN  (NITROSTAT ) 0.4 MG SL tablet Place 1 tablet (0.4 mg total) under the tongue every 5 (five) minutes as needed. 12/07/22 05/15/24 Yes Duke, Jon Garre, PA  rosuvastatin  (CRESTOR ) 20 MG tablet Take 1 tablet (20 mg total) by mouth daily. 04/12/24  Yes Croitoru, Mihai, MD  cephALEXin  (KEFLEX ) 500 MG capsule Take 1 capsule (500 mg total) by mouth in the morning and at bedtime. Patient not taking: Reported on 05/15/2024 10/26/23   Tabori, Katherine E, MD  cetirizine  (ZYRTEC ) 10 MG tablet TAKE 1 TABLET BY MOUTH EVERY DAY Patient not taking: Reported on 05/15/2024 08/27/22   Mahlon Comer BRAVO, MD  DULoxetine  (CYMBALTA ) 20 MG capsule Take 1 capsule (20 mg total) by mouth daily. Patient not taking: Reported on 05/15/2024 05/27/23   Tabori, Katherine E, MD  meclizine  (ANTIVERT ) 25 MG tablet Take 1 tablet (25 mg total) by mouth 3 (three) times daily as needed for dizziness. Patient not taking: Reported on 05/15/2024 05/11/20   Rojelio Nest, DO   Social History   Socioeconomic History   Marital status: Married    Spouse name: Not on file   Number of children: Not on file   Years of education: Not on file   Highest education level: Not on file  Occupational History   Not on file  Tobacco Use   Smoking status: Former    Current packs/day: 0.00    Average packs/day: 0.3 packs/day for 5.0 years (1.3 ttl pk-yrs)    Types: Cigarettes    Start date: 11/16/1963    Quit date: 11/15/1968    Years since quitting: 55.5   Smokeless tobacco: Never  Vaping Use   Vaping status: Never Used  Substance and Sexual Activity   Alcohol use: No    Alcohol/week: 0.0 standard drinks of alcohol   Drug use: No   Sexual  activity: Not Currently  Other Topics Concern   Not on file  Social History Narrative   Not on file   Social Drivers of Health   Financial Resource Strain: Low Risk  (05/20/2023)   Overall Financial Resource Strain (CARDIA)    Difficulty of Paying Living Expenses: Not hard at all  Food Insecurity: No Food Insecurity (05/20/2023)   Hunger Vital Sign    Worried About Running Out of Food in the Last Year: Never true    Ran Out of Food in the Last Year: Never true  Transportation Needs: No Transportation Needs (05/20/2023)   PRAPARE - Administrator, Civil Service (Medical): No    Lack of Transportation (Non-Medical): No  Physical Activity: Inactive (05/20/2023)   Exercise Vital Sign    Days of Exercise per Week: 0 days    Minutes of Exercise per Session: 0 min  Stress: No Stress Concern Present (05/20/2023)   [employer] of Occupational Health - Occupational Stress Questionnaire    Feeling of Stress : Not at all  Social Connections: Moderately Integrated (05/20/2023)   Social Connection and Isolation Panel    Frequency of Communication with Friends and Family: Twice a week    Frequency of Social Gatherings with Friends and Family: Three times a week    Attends Religious Services: More than 4 times per year    Active Member of Clubs or Organizations: No    Attends Banker Meetings: Never    Marital Status: Married  Catering manager Violence: Not At Risk (05/20/2023)   Humiliation, Afraid, Rape, and Kick questionnaire    Fear of Current or Ex-Partner: No    Emotionally Abused: No    Physically Abused: No    Sexually Abused: No    Review of Systems Per HPI   Objective:   Vitals:   05/15/24 1155 05/15/24 1353  BP: (!) 94/42 (!) 92/50  Pulse: 86   Resp: 16   Temp: 98 F (36.7 C)   TempSrc: Temporal   SpO2: 99%   Weight: 145 lb (65.8 kg)   Height: 5' 2 (1.575 m)  Physical Exam Vitals reviewed.  Constitutional:      General: She is not  in acute distress.    Appearance: Normal appearance. She is well-developed. She is not ill-appearing.  HENT:     Head: Normocephalic and atraumatic.  Eyes:     Conjunctiva/sclera: Conjunctivae normal.     Pupils: Pupils are equal, round, and reactive to light.  Neck:     Vascular: No carotid bruit.  Cardiovascular:     Rate and Rhythm: Normal rate and regular rhythm.     Heart sounds: Normal heart sounds.  Pulmonary:     Effort: Pulmonary effort is normal.     Breath sounds: Normal breath sounds.  Abdominal:     General: There is no distension.     Palpations: Abdomen is soft. There is no pulsatile mass.     Tenderness: There is no abdominal tenderness. There is no guarding.  Musculoskeletal:     Right lower leg: No edema.     Left lower leg: No edema.  Skin:    General: Skin is warm and dry.  Neurological:     Mental Status: She is alert and oriented to person, place, and time.  Psychiatric:        Mood and Affect: Mood normal.        Behavior: Behavior normal.   I personally spent a total of *** minutes in the care of the patient today including {Time Based Coding:210964241}. EKG: Sinus rhythm, rate 89, PR 162, QTc 462.Compared to 12/20/2023.  Baseline artifact noted more on prior EKG but some baseline artifact on lead I, III.  No apparent acute ST or T wave changes.  BP Readings from Last 3 Encounters:  05/15/24 (!) 92/50  12/20/23 98/61  10/22/23 116/62   1:57 PM Called cardiology to discuss plan of care for MS. Schultes. Will stop hydrochlorothiazide  for now. They will work her in for an appointment in next few days.    Assessment & Plan:  PHINLEY SCHALL is a 83 y.o. female . Lower abdominal pain - Plan: CBC  Fatigue, unspecified type - Plan: Comprehensive metabolic panel with GFR, CBC  Nausea - Plan: Lipase   No orders of the defined types were placed in this encounter.  There are no Patient Instructions on file for this visit.    Signed,   Reyes Pines,  MD [facility], [facility] 05/15/24 12:38 PM

## 2024-05-15 NOTE — Progress Notes (Unsigned)
 Cardiology Office Note    Date:  05/15/2024  ID:  Colleen Wright, DOB 05-Aug-1941, MRN [id number] PCP:  Mahlon Comer BRAVO, MD  Cardiologist:  Jerel Balding, MD  Electrophysiologist:  None   Chief Complaint: ***  History of Present Illness: Colleen    Colleen Wright is Wright 83 y.o. female with visit-pertinent history of paroxysmal atrial fibrillation on chronic anticoagulation, cryptogenic stroke, hypertension, CAD, carotid artery stenosis and hyperlipidemia.  Patient previously suffered Wright stroke which led to implantable loop recorder which detected asymptomatic atrial fibrillation.  She has been on anticoagulation with Eliquis  5 mg twice daily.  Carotid artery duplex in December 2020 showed 50 to 69% stenosis in the left ICA.  Loop recorder has since reached end of service, while functional ILR showed very relatively low Wright-fib burden.  Patient had cardiac catheterization in June 2017 that showed moderate to severe RCA branch stenosis that was treated with medical therapy due to tortuosity of the proximal vessel.  Patient was hospitalized in 04/2020 vertigo and falls.  She was unable to care for herself at home.  Patient was last seen in clinic on 12/20/2023 by Colleen Hails, PA.  Patient elected to continue with Eliquis  and was not started on Coumadin.  Patient's losartan  was discontinued and she was continued on 12.5 mg of hydrochlorothiazide  alone.  Patient was seen by Dr. Landy on 05/15/2024 with increased weakness over the past year however felt that symptoms had worsened over the past couple of months.  She also reported having trouble walking from the kitchen to the bathroom without sitting down, feeling dizzy at times and short of breath.  Reported intermittent chest pains, unable to identify frequency.  Dr. Landy called DOD, Dr. Mona who recommended discontinuation of hydrochlorothiazide  and follow-up in office.  Today she presents regarding increased fatigue.  She reports that she  HTN: Blood  pressure today  PAF:  CAD:  Hyperlipidemia:  Carotid artery stenosis:   Labwork independently reviewed:   ROS: .   *** denies chest pain, shortness of breath, lower extremity edema, fatigue, palpitations, melena, hematuria, hemoptysis, diaphoresis, weakness, presyncope, syncope, orthopnea, and PND.  All other systems are reviewed and otherwise negative.  Studies Reviewed: Colleen    EKG:  EKG is ordered today, personally reviewed, demonstrating ***     CV Studies: Cardiac studies reviewed are outlined and summarized above. Otherwise please see EMR for full report. Cardiac Studies & Procedures   ______________________________________________________________________________________________ CARDIAC CATHETERIZATION  CARDIAC CATHETERIZATION 01/24/2016  Conclusion  Prox RCA lesion, 20% stenosed.  Mid RCA lesion, 50% stenosed.  Dist RCA-1 lesion, 70% stenosed.  Dist RCA-2 lesion, 80% stenosed. The lesion was previously treated with angioplasty greater than two years ago.  Prox LAD to Mid LAD lesion, 30% stenosed.  Mid LAD lesion, 60% stenosed.  The left ventricular systolic function is normal.  1. Moderate 2 vessel CAD -60% mid LAD -70% distal RCA - 80% ostial PLOM 2. Normal LV function 3. Dilated aortic root. 4. MAC  Plan: would recommend medical therapy. Her symptoms are not clearly anginal. She has been on no antianginal therapy. During her procedure she was noted to have episodes of tachycardia with HR to 130s. She is also hypertensive. The distal RCA lesion would be amenable to PCI but would be difficult due to marked tortuosity of the RCA. I will add metoprolol  25 mg bid and arrange follow up as outpatient.  Findings Coronary Findings Diagnostic  Dominance: Right  Left Anterior Descending Diffuse.  Right  Coronary Artery  Discrete. Discrete. The lesion was previously treated with angioplasty greater than two years ago.  Intervention  No interventions have been  documented.     ECHOCARDIOGRAM  ECHOCARDIOGRAM COMPLETE 08/10/2015  Narrative *[place]* *[facility]* [address] [place], [place] [address] [phone]  ------------------------------------------------------------------- Transthoracic Echocardiography  Patient:    Colleen Wright MR #:       [id number] Study Date: 08/10/2015 Gender:     F Age:        73 Height:     157.5 cm Weight:     67.1 kg BSA:        1.73 m^2 Pt. Status: Room:       5M03C  SONOGRAPHER  Colleen Wright Colleen Wright Colleen Wright  cc:  ------------------------------------------------------------------- LV EF: 55% -   60%  ------------------------------------------------------------------- Indications:      TIA 435.9.  ------------------------------------------------------------------- History:   PMH:   Coronary artery disease.  Risk factors: Hypertension. Dyslipidemia.  ------------------------------------------------------------------- Study Conclusions  - Left ventricle: The cavity size was normal. There was mild concentric hypertrophy. Systolic function was normal. The estimated ejection fraction was in the range of 55% to 60%. Wall motion was normal; there were no regional wall motion abnormalities. - Mitral valve: Moderately calcified annulus. There was mild regurgitation. - Left atrium: The atrium was mildly dilated. - Pulmonary arteries: Systolic pressure was mildly increased. PA peak pressure: 36 mm Hg (S).  Transthoracic echocardiography.  M-mode, complete 2D, spectral Doppler, and color Doppler.  Birthdate:  Patient birthdate: Feb 09, 1941.  Age:  Patient is 83 yr old.  Sex:  Gender: female. BMI: 27.1 kg/m^2.  Blood pressure:     127/47  Patient status: Inpatient.  Study date:  Study date: 08/10/2015. Study time: 09:46 AM.   Location:  Bedside.  -------------------------------------------------------------------  ------------------------------------------------------------------- Left ventricle:  The cavity size was normal. There was mild concentric hypertrophy. Systolic function was normal. The estimated ejection fraction was in the range of 55% to 60%. Wall motion was normal; there were no regional wall motion abnormalities. Abnormal relaxation with increased filling pressures.  ------------------------------------------------------------------- Aortic valve:   Trileaflet; mildly thickened leaflets. Cusp separation was normal.  Doppler:  Transvalvular velocity was within the normal range. There was no stenosis. There was no regurgitation.  ------------------------------------------------------------------- Aorta:  Aortic root: The aortic root was normal in size. Ascending aorta: The ascending aorta was normal in size.  ------------------------------------------------------------------- Mitral valve:   Moderately calcified annulus. Leaflet separation was normal.  Doppler:  Transvalvular velocity was within the normal range. There was no evidence for stenosis. There was mild regurgitation.    Peak gradient (D): 3 mm Hg.  ------------------------------------------------------------------- Left atrium:  The atrium was mildly dilated.  ------------------------------------------------------------------- Right ventricle:  The cavity size was normal. Wall thickness was normal. Systolic function was normal.  ------------------------------------------------------------------- Pulmonic valve:    The valve appears to be grossly normal.   Cusp separation was normal.  Doppler:  Transvalvular velocity was within the normal range. There was no regurgitation.  ------------------------------------------------------------------- Tricuspid valve:   Structurally normal valve.   Leaflet separation was normal.   Doppler:  Transvalvular velocity was within the normal range. There was mild regurgitation.  ------------------------------------------------------------------- Pulmonary artery:   Systolic pressure was mildly increased.  ------------------------------------------------------------------- Right atrium:  The atrium was normal in size.  -------------------------------------------------------------------  Pericardium:  There was no pericardial effusion.  ------------------------------------------------------------------- Measurements  Left ventricle                         Value        Reference LV ID, ED, PLAX chordal        (L)     37.4  mm     43 - 52 LV ID, ES, PLAX chordal        (L)     17.4  mm     23 - 38 LV fx shortening, PLAX chordal         53    %      >=29 LV PW thickness, ED                    13.4  mm     --------- IVS/LV PW ratio, ED                    1.1          <=1.3 LV e&', lateral                         6.31  cm/s   --------- LV E/e&', lateral                       13.07        --------- LV e&', medial                          4.57  cm/s   --------- LV E/e&', medial                        18.05        --------- LV e&', average                         5.44  cm/s   --------- LV E/e&', average                       15.17        ---------  Ventricular septum                     Value        Reference IVS thickness, ED                      14.7  mm     ---------  LVOT                                   Value        Reference LVOT ID, S                             20    mm     --------- LVOT area                              3.14  cm^2   ---------  Aorta  Value        Reference Aortic root ID, ED                     30    mm     ---------  Left atrium                            Value        Reference LA ID, Wright-P, ES                         37    mm     --------- LA ID/bsa, Wright-P                         2.14  cm/m^2 <=2.2 LA volume, S                            53.4  ml     --------- LA volume/bsa, S                       30.8  ml/m^2 --------- LA volume, ES, 1-p A4C                 39.4  ml     --------- LA volume/bsa, ES, 1-p A4C             22.8  ml/m^2 --------- LA volume, ES, 1-p A2C                 62.6  ml     --------- LA volume/bsa, ES, 1-p A2C             36.1  ml/m^2 ---------  Mitral valve                           Value        Reference Mitral E-wave peak velocity            82.5  cm/s   --------- Mitral Wright-wave peak velocity            110   cm/s   --------- Mitral deceleration time       (H)     331   ms     150 - 230 Mitral peak gradient, D                3     mm Hg  --------- Mitral E/Wright ratio, peak                 0.8          ---------  Pulmonary arteries                     Value        Reference PA pressure, S, DP             (H)     36    mm Hg  <=30  Tricuspid valve                        Value        Reference Tricuspid regurg peak velocity         263   cm/s   --------- Tricuspid peak  RV-RA gradient          28    mm Hg  ---------  Systemic veins                         Value        Reference Estimated CVP                          8     mm Hg  ---------  Right ventricle                        Value        Reference RV pressure, S, DP             (H)     36    mm Hg  <=30 RV s&', lateral, S                      11.7  cm/s   ---------  Legend: (L)  and  (H)  mark values outside specified reference range.  ------------------------------------------------------------------- Prepared and Electronically Authenticated by  Jerel Balding, MD 2016-12-24T13:38:49          ______________________________________________________________________________________________       Current Reported Medications:.    No outpatient medications have been marked as taking for the 05/17/24 encounter (Appointment) with Shalini Mair D, NP.    Physical Exam:    VS:  There were no vitals taken for this  visit.   Wt Readings from Last 3 Encounters:  05/15/24 145 lb (65.8 kg)  12/20/23 154 lb 9.6 oz (70.1 kg)  10/22/23 154 lb (69.9 kg)    GEN: Well nourished, well developed in no acute distress NECK: No JVD; No carotid bruits CARDIAC: ***RRR, no murmurs, rubs, gallops RESPIRATORY:  Clear to auscultation without rales, wheezing or rhonchi  ABDOMEN: Soft, non-tender, non-distended EXTREMITIES:  No edema; No acute deformity     Asessement and Plan:.     ***     Disposition: F/u with ***  Signed, Saber Dickerman D Caydon Feasel, NP

## 2024-05-15 NOTE — Patient Instructions (Addendum)
 Blood pressure is low today, stop hydrochlorothiazide  for now.  I did speak with cardiology, they will be calling you to have you seen hopefully someday this week.  I am checking some blood work today and they can review those results to help decide on the next step.  Make sure to drink fluids throughout the day, try to have regular meals or meal supplement such as Ensure or boost if needed.  If any acute worsening of symptoms proceed to the emergency room as we discussed.  Hang in there!   Cardiology appointment: Wednesday 10/1 @ 3:10pm with Katlyn West NP

## 2024-05-15 NOTE — Telephone Encounter (Signed)
 Dr. Levora wants to consult regarding patient's constant fatigue and SOB.  Dr. Landy states patient is in office.

## 2024-05-15 NOTE — Telephone Encounter (Signed)
 Spoke with Dr. Landy regarding [name] who has had worsening fatigue and shortness of breath.  He advised stopping her thiazide medication.  He ordered some labs as well to see if she possibly could have some volume overload.  I advise scheduling an appointment with heart care and will be able to see her in 2 days on Wednesday.  Vinie KYM Maxcy, MD, [facility], FNLA, FACP  [place]  [facility]  Medical Director of the [facility] Lipid [facility] &  Cardiovascular Risk Reduction Clinic Diplomate of the American Board of Clinical Lipidology Attending Cardiologist  Direct Dial: [phone]  Fax: [phone]  Website:  [email or web address]

## 2024-05-16 ENCOUNTER — Other Ambulatory Visit (INDEPENDENT_AMBULATORY_CARE_PROVIDER_SITE_OTHER)

## 2024-05-16 ENCOUNTER — Ambulatory Visit: Payer: Self-pay | Admitting: Family Medicine

## 2024-05-16 ENCOUNTER — Other Ambulatory Visit: Payer: Self-pay | Admitting: Family Medicine

## 2024-05-16 ENCOUNTER — Other Ambulatory Visit: Payer: Self-pay

## 2024-05-16 ENCOUNTER — Emergency Department (HOSPITAL_BASED_OUTPATIENT_CLINIC_OR_DEPARTMENT_OTHER)
Admission: EM | Admit: 2024-05-16 | Discharge: 2024-05-16 | Disposition: A | Discharge status: Home / Self Care | Source: Ambulatory Visit | Attending: Emergency Medicine | Admitting: Emergency Medicine

## 2024-05-16 ENCOUNTER — Telehealth: Payer: Self-pay

## 2024-05-16 DIAGNOSIS — E876 Hypokalemia: Secondary | ICD-10-CM

## 2024-05-16 DIAGNOSIS — Z8673 Personal history of transient ischemic attack (TIA), and cerebral infarction without residual deficits: Secondary | ICD-10-CM | POA: Insufficient documentation

## 2024-05-16 DIAGNOSIS — I10 Essential (primary) hypertension: Secondary | ICD-10-CM

## 2024-05-16 DIAGNOSIS — Z7901 Long term (current) use of anticoagulants: Secondary | ICD-10-CM | POA: Diagnosis not present

## 2024-05-16 DIAGNOSIS — E039 Hypothyroidism, unspecified: Secondary | ICD-10-CM | POA: Diagnosis not present

## 2024-05-16 DIAGNOSIS — I2510 Atherosclerotic heart disease of native coronary artery without angina pectoris: Secondary | ICD-10-CM | POA: Diagnosis not present

## 2024-05-16 DIAGNOSIS — R7989 Other specified abnormal findings of blood chemistry: Secondary | ICD-10-CM

## 2024-05-16 DIAGNOSIS — Z79899 Other long term (current) drug therapy: Secondary | ICD-10-CM | POA: Diagnosis not present

## 2024-05-16 DIAGNOSIS — R531 Weakness: Secondary | ICD-10-CM

## 2024-05-16 LAB — BASIC METABOLIC PANEL WITH GFR
BUN: 33 mg/dL — ABNORMAL HIGH (ref 6–23)
CO2: 29 meq/L (ref 19–32)
Calcium: 10.2 mg/dL (ref 8.4–10.5)
Chloride: 96 meq/L (ref 96–112)
Creatinine, Ser: 1.47 mg/dL — ABNORMAL HIGH (ref 0.40–1.20)
GFR: 32.42 mL/min — ABNORMAL LOW (ref >60.00)
Glucose, Bld: 163 mg/dL — ABNORMAL HIGH (ref 70–99)
Potassium: 2.8 meq/L — CL (ref 3.5–5.1)
Sodium: 136 meq/L (ref 135–145)

## 2024-05-16 LAB — COMPREHENSIVE METABOLIC PANEL WITH GFR
ALT: 20 U/L (ref 0–44)
AST: 45 U/L — ABNORMAL HIGH (ref 15–41)
Albumin: 4.2 g/dL (ref 3.5–5.0)
Alkaline Phosphatase: 86 U/L (ref 38–126)
Anion gap: 16 — ABNORMAL HIGH (ref 5–15)
BUN: 34 mg/dL — ABNORMAL HIGH (ref 8–23)
CO2: 24 mmol/L (ref 22–32)
Calcium: 10.4 mg/dL — ABNORMAL HIGH (ref 8.9–10.3)
Chloride: 95 mmol/L — ABNORMAL LOW (ref 98–111)
Creatinine, Ser: 1.56 mg/dL — ABNORMAL HIGH (ref 0.44–1.00)
GFR, Estimated: 32 mL/min — ABNORMAL LOW (ref >60)
Glucose, Bld: 168 mg/dL — ABNORMAL HIGH (ref 70–99)
Potassium: 2.9 mmol/L — ABNORMAL LOW (ref 3.5–5.1)
Sodium: 136 mmol/L (ref 135–145)
Total Bilirubin: 0.6 mg/dL (ref 0.0–1.2)
Total Protein: 7.7 g/dL (ref 6.5–8.1)

## 2024-05-16 LAB — CBC
HCT: 37.6 % (ref 36.0–46.0)
Hemoglobin: 13.1 g/dL (ref 12.0–15.0)
MCH: 29.8 pg (ref 26.0–34.0)
MCHC: 34.8 g/dL (ref 30.0–36.0)
MCV: 85.6 fL (ref 80.0–100.0)
Platelets: 206 K/uL (ref 150–400)
RBC: 4.39 MIL/uL (ref 3.87–5.11)
RDW: 12.4 % (ref 11.5–15.5)
WBC: 7.4 K/uL (ref 4.0–10.5)
nRBC: 0 % (ref 0.0–0.2)

## 2024-05-16 LAB — MAGNESIUM: Magnesium: 2.1 mg/dL (ref 1.7–2.4)

## 2024-05-16 LAB — TSH: TSH: 0.21 u[IU]/mL — ABNORMAL LOW (ref 0.35–5.50)

## 2024-05-16 LAB — CBG MONITORING, ED: Glucose-Capillary: 151 mg/dL — ABNORMAL HIGH (ref 70–99)

## 2024-05-16 MED ORDER — POTASSIUM CHLORIDE CRYS ER 20 MEQ PO TBCR
40.0000 meq | EXTENDED_RELEASE_TABLET | Freq: Once | ORAL | Status: AC
  Administered 2024-05-16: 40 meq via ORAL
  Filled 2024-05-16: qty 2

## 2024-05-16 MED ORDER — POTASSIUM CHLORIDE CRYS ER 20 MEQ PO TBCR: 20.0000 meq | EXTENDED_RELEASE_TABLET | Freq: Every day | ORAL | 0 refills | Status: DC

## 2024-05-16 MED ORDER — CEPHALEXIN 500 MG PO CAPS: 500.0000 mg | ORAL_CAPSULE | Freq: Two times a day (BID) | ORAL | 0 refills | Status: DC

## 2024-05-16 MED ORDER — LEVOTHYROXINE SODIUM 112 MCG PO TABS: 112.0000 ug | ORAL_TABLET | Freq: Every day | ORAL | 3 refills | Status: DC

## 2024-05-16 NOTE — Progress Notes (Signed)
Called patient and she will pick up medication.

## 2024-05-16 NOTE — ED Triage Notes (Signed)
 Pt POV reporting weakness x2 weeks, blood work at PCP today, potassium 2.8, advised to come to ED.

## 2024-05-16 NOTE — Telephone Encounter (Signed)
Lab called with critical potassium of 2.8

## 2024-05-16 NOTE — Discharge Instructions (Addendum)
 Thank you for letting us  evaluate you today.  I provided you with potassium here Emergency Department.  Of also provided prescription for potassium for the next week.  Please take as prescribed and do not miss a dose.  Please follow-up with PCP in 1 week to have potassium levels redrawn.  Please continue taking antibiotic for UTI  Return to Emergency Department if you experience chest pain, shortness of breath, altered mentation, worsening weakness, worsening symptoms

## 2024-05-16 NOTE — ED Provider Notes (Signed)
 [place] EMERGENCY DEPARTMENT AT [facility] Provider Note   CSN: [id number] Arrival date & time: 05/16/24  8151     Patient presents with: hypokalemia   Colleen Wright is a 83 y.o. femalewith a past medical history of HLD, hypothyroidism, CAD, HTN, CVA presents to Emergency Department for evaluation of generalized weakness, hypokalemia.  Has had generalized weakness for a long time but more notably the past 2 weeks.  Recently had dysuria for past 1-2 days.  Has a history of recurrent UTIs.  Is on day 1 of Keflex  for UTI.  Daughter-in-law at bedside reports that patient has been acting per her baseline.  No cough, congestion, headache, slurred speech, aphasia, chest pain, shortness of breath.  Patient was recommended to come to emergency department due to hypokalemia of 2.8 found on BMP lab today.  Has been taking HCTZ and recently stopped HCTZ for low blood pressures of 90 systolic.  Denies abdominal pain, NVD   HPI     Prior to Admission medications   Medication Sig Start Date End Date Taking? Authorizing Provider  ALPRAZolam  (XANAX ) 0.5 MG tablet TAKE 1 TABLET BY MOUTH EVERYDAY AT BEDTIME 02/28/24   Webb, Padonda B, FNP  apixaban  (ELIQUIS ) 5 MG TABS tablet Take 1 tablet (5 mg total) by mouth 2 (two) times daily. Patient taking differently: Take 5 mg by mouth daily. 12/20/23   Duke, Jon Garre, PA  azelastine  (ASTELIN ) 0.1 % nasal spray Place 1 spray into both nostrils 2 (two) times daily. Use in each nostril as directed 06/06/21   Kip Ade, NP  cephALEXin  (KEFLEX ) 500 MG capsule Take 1 capsule (500 mg total) by mouth in the morning and at bedtime. 05/16/24   Levora Reyes SAUNDERS, MD  cetirizine  (ZYRTEC ) 10 MG tablet TAKE 1 TABLET BY MOUTH EVERY DAY Patient not taking: Reported on 05/15/2024 08/27/22   Tabori, Katherine E, MD  DULoxetine  (CYMBALTA ) 20 MG capsule Take 1 capsule (20 mg total) by mouth daily. Patient not taking: Reported on 05/15/2024 05/27/23   Tabori,  Katherine E, MD  ELIQUIS  5 MG TABS tablet TAKE 1 TABLET BY MOUTH TWICE A DAY Patient taking differently: Take 5 mg by mouth daily. 06/15/23   Duke, Jon Garre, PA  hydrochlorothiazide  (MICROZIDE ) 12.5 MG capsule Take 1 capsule (12.5 mg total) by mouth daily. 12/20/23 05/15/24  Madie Jon Garre, PA  levothyroxine  (SYNTHROID ) 112 MCG tablet Take 1 tablet (112 mcg total) by mouth daily. 05/16/24   Levora Reyes SAUNDERS, MD  meclizine  (ANTIVERT ) 25 MG tablet Take 1 tablet (25 mg total) by mouth 3 (three) times daily as needed for dizziness. Patient not taking: Reported on 05/15/2024 05/11/20   Rojelio Nest, DO  metoprolol  succinate (TOPROL -XL) 50 MG 24 hr tablet Take 0.5 tablets (25 mg total) by mouth 2 (two) times daily. Take with or immediately following a meal. 10/11/23   Duke, Jon Garre, PA  nitroGLYCERIN  (NITROSTAT ) 0.4 MG SL tablet Place 1 tablet (0.4 mg total) under the tongue every 5 (five) minutes as needed. 12/07/22 05/15/24  Madie Jon Garre, PA  potassium chloride  SA (KLOR-CON  M) 20 MEQ tablet Take 1 tablet (20 mEq total) by mouth daily for 7 days. 05/16/24 05/23/24 Yes Minnie Tinnie BRAVO, PA  rosuvastatin  (CRESTOR ) 20 MG tablet Take 1 tablet (20 mg total) by mouth daily. 04/12/24   Croitoru, Mihai, MD    Allergies: Amoxicillin    Review of Systems  Neurological:  Positive for weakness.    Updated Vital Signs BP 135/72  Pulse 88   Temp (!) 97.5 F (36.4 C)   Resp 13   Ht 5' 2 (1.575 m)   Wt 65.8 kg   SpO2 100%   BMI 26.52 kg/m   Physical Exam Vitals and nursing note reviewed.  Constitutional:      General: She is not in acute distress.    Appearance: Normal appearance.  HENT:     Head: Normocephalic and atraumatic.  Eyes:     General: Lids are normal. Vision grossly intact. No visual field deficit.    Extraocular Movements:     Right eye: Normal extraocular motion and no nystagmus.     Left eye: Normal extraocular motion and no nystagmus.     Conjunctiva/sclera:  Conjunctivae normal.     Comments: Denies dizziness nor visual disturbances  Cardiovascular:     Rate and Rhythm: Normal rate.  Pulmonary:     Effort: Pulmonary effort is normal. No respiratory distress.     Breath sounds: Normal breath sounds.  Abdominal:     General: Bowel sounds are normal. There is no distension.     Palpations: Abdomen is soft.     Tenderness: There is no abdominal tenderness. There is no guarding or rebound.  Musculoskeletal:     Cervical back: Normal range of motion and neck supple. No rigidity.     Right lower leg: No edema.     Left lower leg: No edema.  Skin:    Capillary Refill: Capillary refill takes less than 2 seconds.     Coloration: Skin is not jaundiced or pale.  Neurological:     Mental Status: She is alert and oriented to person, place, and time. Mental status is at baseline.     GCS: GCS eye subscore is 4. GCS verbal subscore is 5. GCS motor subscore is 6.     Cranial Nerves: No cranial nerve deficit, dysarthria or facial asymmetry.     Sensory: Sensation is intact. No sensory deficit.     Motor: No weakness, tremor, atrophy, abnormal muscle tone, seizure activity or pronator drift.     Coordination: Coordination normal. Finger-Nose-Finger Test and Heel to [place] Test normal.     Gait: Gait is intact.     Comments: Motor 5/5 and sensation 2/2 BUE and BLE.  Following commands appropriately.  No slurred speech or aphasia.     (all labs ordered are listed, but only abnormal results are displayed) Labs Reviewed  COMPREHENSIVE METABOLIC PANEL WITH GFR - Abnormal; Notable for the following components:      Result Value   Potassium 2.9 (*)    Chloride 95 (*)    Glucose, Bld 168 (*)    BUN 34 (*)    Creatinine, Ser 1.56 (*)    Calcium  10.4 (*)    AST 45 (*)    GFR, Estimated 32 (*)    Anion gap 16 (*)    All other components within normal limits  CBG MONITORING, ED - Abnormal; Notable for the following components:   Glucose-Capillary 151 (*)     All other components within normal limits  CBC  MAGNESIUM  URINALYSIS, ROUTINE W REFLEX MICROSCOPIC    EKG: EKG Interpretation Date/Time:  Tuesday May 16 2024 19:14:27 EDT Ventricular Rate:  78 PR Interval:  160 QRS Duration:  88 QT Interval:  375 QTC Calculation: 428 R Axis:   83  Text Interpretation: Sinus rhythm Atrial premature complex Borderline right axis deviation Low voltage, precordial leads Confirmed by Mannie Pac [phone])  on 05/16/2024 9:56:44 PM  Radiology: No results found.   Medications Ordered in the ED  potassium chloride  SA (KLOR-CON  M) CR tablet 40 mEq (40 mEq Oral Given 05/16/24 2059)                                    Medical Decision Making Amount and/or Complexity of Data Reviewed Labs: ordered.  Risk Prescription drug management.   Patient presents to the ED for concern of generalized weakness, hypokalemia, this involves an extensive number of treatment options, and is a complaint that carries with it a high risk of complications and morbidity.  The differential diagnosis includes ACS, electrolyte abnormality, dehydration, UTI, infection, hypothyroidism, medication noncompliance, polypharmacy, hypoglycemia, renal insufficiency.  Nonexhaustive list   Co morbidities that complicate the patient evaluation  See HPI   Additional history obtained:  Additional history obtained from Nursing and Outside Medical Records   External records from outside source obtained and reviewed including triage RN note   Lab Tests:  I Ordered, and personally interpreted labs.  The pertinent results include:   Magnesium WNL Potassium 2.9    Cardiac Monitoring:  The patient was maintained on a cardiac monitor.  I personally viewed and interpreted the cardiac monitored which showed an underlying rhythm of: NSR with no ST nor T wave abnormalities   Medicines ordered and prescription drug management:  I ordered medication including potassium for  hypokalemia Reevaluation of the patient after these medicines showed that the patient improved I have reviewed the patients home medicines and have made adjustments as needed     Problem List / ED Course:  Hypokalemia 2.9 Mag WNL As patient is tolerating p.o., provided p.o. supplementation and potassium No complaints of NVD May be caused by HCTZ Provided potassium prescription  Generalized weakness No focal deficits.  No slurred speech aphasia nor visual disturbances.  Low suspicion for CVA Is currently being treated for UTI with Keflex .  On day 1. As female may present with atypical symptoms, I did consider ACS, however has no complaints of chest pain, shortness of breath.  EKG without ST nor T wave abnormalities Has cardiology appointment tomorrow for chronic generalized weakness to ensure no component of cardiac etiology.  Does not appear fluid overloaded.  As patient has had general weakness for a year, many weeks, I have low suspicion of ACS.  EKG without ST or T wave abnormalities. Weakness could be related to dehydration as patient only drinks 1 water bottle of water a day, recent hypotension, UTI, and hypokalemia. Also a component of deconditioning is possible Review of lab work is noted to have mildly decreased TSH for which PCP adjusted Synthroid  for At review of labwork, patient is asking to be discharged   Reevaluation:  After the interventions noted above, I reevaluated the patient and found that they have :stayed the same   Social Determinants of Health:  Has PCP follow-up   Dispostion:  After consideration of the diagnostic results and the patients response to treatment, I feel that the patent would benefit from outpatient management with symptomatic care and PCP follow-up.   Discussed ED workup, disposition, return to ED precautions with patient who expresses understanding agrees with plan.  All questions answered to their satisfaction.  They are agreeable to  plan.  Discharge instructions provided on paperwork  Dr. Mannie individually assessed patient, reviewed ED workup, and agrees with plan  Final diagnoses:  Hypokalemia  Generalized weakness    ED Discharge Orders          Ordered    potassium chloride  SA (KLOR-CON  M) 20 MEQ tablet  Daily        05/16/24 2201             Minnie Tinnie BRAVO, PA 05/16/24 2202    Mannie Pac T, DO 05/22/24 [phone]

## 2024-05-16 NOTE — Telephone Encounter (Signed)
 Called patient to discuss results.  She answered but was on the line with [employer] and requested a callback in 15 to 20 minutes.  Unfortunately her potassium is still low as verified on repeat test today, and at that level concern that this may be contributing to some of her symptoms from yesterday.  Would be best to have this treated through ER for repletion, and other testing if needed based on that level.  Will advise patient for ER evaluation tonight if possible.  Noted her TSH is also mildly low, with weight loss may have less of a need.  I will decrease her Synthroid  dose temporarily and close follow-up with PCP. Creatinine still elevated but improved from reading yesterday, and similar to reading in September 2024.  As previously stated, this could be in part due to hypotension which should improve with stopping HCTZ.

## 2024-05-17 ENCOUNTER — Ambulatory Visit: Admitting: Family Medicine

## 2024-05-17 ENCOUNTER — Encounter: Payer: Self-pay | Admitting: Cardiology

## 2024-05-17 ENCOUNTER — Ambulatory Visit: Attending: Internal Medicine | Admitting: Cardiology

## 2024-05-17 VITALS — BP 109/67 | HR 56 | Ht 62.0 in | Wt 145.0 lb

## 2024-05-17 DIAGNOSIS — R531 Weakness: Secondary | ICD-10-CM | POA: Diagnosis not present

## 2024-05-17 DIAGNOSIS — R0602 Shortness of breath: Secondary | ICD-10-CM | POA: Diagnosis not present

## 2024-05-17 DIAGNOSIS — I480 Paroxysmal atrial fibrillation: Secondary | ICD-10-CM | POA: Diagnosis not present

## 2024-05-17 DIAGNOSIS — I10 Essential (primary) hypertension: Secondary | ICD-10-CM

## 2024-05-17 DIAGNOSIS — I25118 Atherosclerotic heart disease of native coronary artery with other forms of angina pectoris: Secondary | ICD-10-CM

## 2024-05-17 MED ORDER — LOSARTAN POTASSIUM 25 MG PO TABS: 25.0000 mg | ORAL_TABLET | Freq: Every day | ORAL | 3 refills | Status: AC

## 2024-05-17 NOTE — Patient Instructions (Addendum)
 Medication Instructions:   Stop Hydrochlorothiazide   Start Losartan  25 mg take one tablet at bedtime *If you need a refill on your cardiac medications before your next appointment, please call your pharmacy*  Testing/Procedures: Your physician has requested that you have an echocardiogram. Echocardiography is a painless test that uses sound waves to create images of your heart. It provides your doctor with information about the size and shape of your heart and how well your heart's chambers and valves are working. This procedure takes approximately one hour. There are no restrictions for this procedure. Please do NOT wear cologne, perfume, aftershave, or lotions (deodorant is allowed). Please arrive 15 minutes prior to your appointment time.  Please note: We ask at that you not bring children with you during ultrasound (echo/ vascular) testing. Due to room size and safety concerns, children are not allowed in the ultrasound rooms during exams. Our front office staff cannot provide observation of children in our lobby area while testing is being conducted. An adult accompanying a patient to their appointment will only be allowed in the ultrasound room at the discretion of the ultrasound technician under special circumstances. We apologize for any inconvenience.   Follow-Up: At [facility], you and your health needs are our priority.  As part of our continuing mission to provide you with exceptional heart care, our providers are all part of one team.  This team includes your primary Cardiologist (physician) and Advanced Practice Providers or APPs (Physician Assistants and Nurse Practitioners) who all work together to provide you with the care you need, when you need it.  Your next appointment:   8 week(s)  Provider:   Katlyn West, NP          We recommend signing up for the patient portal called MyChart.  Sign up information is provided on this After Visit Summary.  MyChart is used  to connect with patients for Virtual Visits (Telemedicine).  Patients are able to view lab/test results, encounter notes, upcoming appointments, etc.  Non-urgent messages can be sent to your provider as well.   To learn more about what you can do with MyChart, go to [email or web address].   Other Instructions Please monitor your blood pressure. Please let us  know it your systolic (top number) is less than 100 or greater than 140

## 2024-05-17 NOTE — Telephone Encounter (Signed)
 Patient spoke to Dr. Levora and was sent to ED for eval.   Copied from CRM [id number]. Topic: General - Call Back - No Documentation >> May 16, 2024  5:06 PM Shereese L wrote: Reason for CRM: Patient called in returning office call and requesting a call back to go over lab results

## 2024-05-17 NOTE — Telephone Encounter (Signed)
 This was addressed by Dr Levora in a separate phone note

## 2024-05-18 ENCOUNTER — Telehealth: Payer: Self-pay | Admitting: Cardiovascular Disease

## 2024-05-18 LAB — URINE CULTURE
MICRO NUMBER:: 17030097
SPECIMEN QUALITY:: ADEQUATE

## 2024-05-18 NOTE — Telephone Encounter (Signed)
 Left message for patient to call back

## 2024-05-18 NOTE — Telephone Encounter (Signed)
 Spoke with pt's daughter and explained that per OV note from yesterday, pt will stop Hydrochlorothiazide , start Losartan  instead. She will continue Metoprolol  Succinate 25 mg BID. Pt's daughter verbalized understanding and had no further questions at this time.

## 2024-05-18 NOTE — Telephone Encounter (Signed)
 Patient is returning your call. Please advise. ?

## 2024-05-18 NOTE — Telephone Encounter (Signed)
 Pt c/o medication issue:  1. Name of Medication:   metoprolol  succinate (TOPROL -XL) 50 MG 24 hr tablet   2. How are you currently taking this medication (dosage and times per day)?   3. Are you having a reaction (difficulty breathing--STAT)?   4. What is your medication issue?   Daughter Lessie) stated patient had medication change yesterday but wants to know if patient still needs to be taking this medication.

## 2024-05-23 ENCOUNTER — Ambulatory Visit: Admitting: Family Medicine

## 2024-05-23 ENCOUNTER — Other Ambulatory Visit: Payer: Self-pay | Admitting: Family Medicine

## 2024-05-23 ENCOUNTER — Encounter: Payer: Self-pay | Admitting: Family Medicine

## 2024-05-23 VITALS — BP 98/60 | HR 62 | Temp 98.0°F | Ht 62.0 in | Wt 148.0 lb

## 2024-05-23 DIAGNOSIS — N390 Urinary tract infection, site not specified: Secondary | ICD-10-CM

## 2024-05-23 DIAGNOSIS — E876 Hypokalemia: Secondary | ICD-10-CM

## 2024-05-23 DIAGNOSIS — I952 Hypotension due to drugs: Secondary | ICD-10-CM | POA: Diagnosis not present

## 2024-05-23 DIAGNOSIS — E039 Hypothyroidism, unspecified: Secondary | ICD-10-CM | POA: Diagnosis not present

## 2024-05-23 LAB — BASIC METABOLIC PANEL WITH GFR
BUN: 30 mg/dL — ABNORMAL HIGH (ref 6–23)
CO2: 25 meq/L (ref 19–32)
Calcium: 9.5 mg/dL (ref 8.4–10.5)
Chloride: 105 meq/L (ref 96–112)
Creatinine, Ser: 1.27 mg/dL — ABNORMAL HIGH (ref 0.40–1.20)
GFR: 38.63 mL/min — ABNORMAL LOW (ref >60.00)
Glucose, Bld: 77 mg/dL (ref 70–99)
Potassium: 4.5 meq/L (ref 3.5–5.1)
Sodium: 139 meq/L (ref 135–145)

## 2024-05-23 LAB — CBC WITH DIFFERENTIAL/PLATELET
Basophils Absolute: 0 K/uL (ref 0.0–0.1)
Basophils Relative: 0.7 % (ref 0.0–3.0)
Eosinophils Absolute: 0.3 K/uL (ref 0.0–0.7)
Eosinophils Relative: 4 % (ref 0.0–5.0)
HCT: 35.7 % — ABNORMAL LOW (ref 36.0–46.0)
Hemoglobin: 12.1 g/dL (ref 12.0–15.0)
Lymphocytes Relative: 32.4 % (ref 12.0–46.0)
Lymphs Abs: 2.1 K/uL (ref 0.7–4.0)
MCHC: 33.8 g/dL (ref 30.0–36.0)
MCV: 87 fl (ref 78.0–100.0)
Monocytes Absolute: 0.6 K/uL (ref 0.1–1.0)
Monocytes Relative: 8.7 % (ref 3.0–12.0)
Neutro Abs: 3.5 K/uL (ref 1.4–7.7)
Neutrophils Relative %: 54.2 % (ref 43.0–77.0)
Platelets: 180 K/uL (ref 150.0–400.0)
RBC: 4.11 Mil/uL (ref 3.87–5.11)
RDW: 13 % (ref 11.5–15.5)
WBC: 6.4 K/uL (ref 4.0–10.5)

## 2024-05-23 LAB — POCT URINALYSIS DIPSTICK
Bilirubin, UA: POSITIVE
Blood, UA: NEGATIVE
Glucose, UA: NEGATIVE
Ketones, UA: NEGATIVE
Leukocytes, UA: NEGATIVE
Nitrite, UA: NEGATIVE
Protein, UA: POSITIVE — AB
Spec Grav, UA: >=1.03 — AB (ref 1.010–1.025)
Urobilinogen, UA: 0.2 U/dL
pH, UA: 6 (ref 5.0–8.0)

## 2024-05-23 LAB — TSH: TSH: 0.14 u[IU]/mL — ABNORMAL LOW (ref 0.35–5.50)

## 2024-05-23 MED ORDER — ALPRAZOLAM 0.5 MG PO TABS: ORAL_TABLET | ORAL | 1 refills | Status: DC

## 2024-05-23 NOTE — Progress Notes (Signed)
   Subjective:    Patient ID: Colleen Wright, female    DOB: [birth year], 83 y.o.   MRN: [id number]  HPI ER f/u- pt went to the ER on 9/30 for generalized weakness/low K+.  She also had dysuria and found to have UTI- tx'd w/ Keflex .  K+ 2.8.  Stopped hydrochlorothiazide .  PO K+ was given in ER and she was given K+ prescription.  She saw Cardiology on 10/1.  They decreased Losartan  to 25mg  daily.  She remains on Metoprolol  25mg  BID.  To assess for chest discomfort and SOB, they ordered an ECHO (scheduled for 10/31).  Today, pt reports some improvement in fatigue.  She continues to feel 'wobbly' and attributes this to medication.  BP is low today- home BP has been 92-103/52-62.  Denies burning w/ urination but does have suprapubic discomfort when she has to void.   Review of Systems For ROS see HPI     Objective:   Physical Exam Vitals reviewed.  Constitutional:      General: She is not in acute distress.    Appearance: Normal appearance. She is well-developed. She is not ill-appearing.  HENT:     Head: Normocephalic and atraumatic.  Eyes:     Conjunctiva/sclera: Conjunctivae normal.     Pupils: Pupils are equal, round, and reactive to light.  Neck:     Thyroid : No thyromegaly.  Cardiovascular:     Rate and Rhythm: Normal rate and regular rhythm.     Pulses: Normal pulses.     Heart sounds: Normal heart sounds. No murmur heard. Pulmonary:     Effort: Pulmonary effort is normal. No respiratory distress.     Breath sounds: Normal breath sounds.  Abdominal:     General: There is no distension.     Palpations: Abdomen is soft.     Tenderness: There is no abdominal tenderness.  Musculoskeletal:     Cervical back: Normal range of motion and neck supple.     Right lower leg: No edema.     Left lower leg: No edema.  Lymphadenopathy:     Cervical: No cervical adenopathy.  Skin:    General: Skin is warm and dry.  Neurological:     Mental Status: She is alert and oriented to person,  place, and time. Mental status is at baseline.  Psychiatric:        Mood and Affect: Mood normal.        Behavior: Behavior normal.        Thought Content: Thought content normal.           Assessment & Plan:

## 2024-05-23 NOTE — Assessment & Plan Note (Signed)
 Repeat UA and culture today as sensitivities showed indeterminate effectiveness of Keflex  last infxn.

## 2024-05-23 NOTE — Assessment & Plan Note (Signed)
 Pt has hx of this.  Recent K+ 2.8 and she was seen in the ER.  Her hydrochlorothiazide  was stopped and she was given prescription for Kdur 20meq daily.  Check labs today and make sure levels are improved.

## 2024-05-23 NOTE — Assessment & Plan Note (Signed)
 Pt's BP is low here in office and home BP's have not been much higher.  She reports feeling 'wobbly' which could be due to hypotension.  Decrease Losartan  to 1/2 tab nightly and monitor.  Pt expressed understanding and is in agreement w/ plan.

## 2024-05-23 NOTE — Assessment & Plan Note (Signed)
 Pt's TSH in ER was mildly decreased.  Repeat today and see if medication needs to be adjusted.

## 2024-05-23 NOTE — Patient Instructions (Addendum)
 Follow up as needed or as scheduled We'll notify you of your lab results and make any changes if needed Make sure you are drinking fluids to avoid dehydration DECREASE your Losartan  to 1/2 tab nightly.  Hopefully this will improve your blood pressure and give you more energy Call with any questions or concerns Hang in there!!

## 2024-05-24 ENCOUNTER — Ambulatory Visit: Payer: Self-pay | Admitting: Family Medicine

## 2024-05-24 DIAGNOSIS — E039 Hypothyroidism, unspecified: Secondary | ICD-10-CM

## 2024-05-24 MED ORDER — LEVOTHYROXINE SODIUM 100 MCG PO TABS: 100.0000 ug | ORAL_TABLET | Freq: Every day | ORAL | 3 refills | Status: DC

## 2024-05-24 NOTE — Telephone Encounter (Signed)
-----   Message from Comer Greet sent at 05/24/2024  7:47 AM EDT ----- Your Potassium looks MUCH better!!  So does your Creatinine (kidney function)!!  Your TSH is a little low which means we need to lower your Levothyroxine  dose to from 112 --> 100mcg daily (#30, 3 refills).  We will repeat your TSH at a lab only visit in 1 month to ensure your TSH  is back in range (TSH, dx- hypothyroid)  Remainder of labs look good! ----- Message ----- From: Shara Bascom RAMAN, CMA Sent: 05/23/2024  12:11 PM EDT To: Comer FORBES Greet, MD

## 2024-05-24 NOTE — Telephone Encounter (Signed)
 Patient verbalized understanding of labs  Sent in new rx for levothyroxine .   Pended TSH lab

## 2024-05-26 LAB — URINE CULTURE
MICRO NUMBER:: 17067525
SPECIMEN QUALITY:: ADEQUATE

## 2024-05-26 MED ORDER — SULFAMETHOXAZOLE-TRIMETHOPRIM 800-160 MG PO TABS: 1.0000 | ORAL_TABLET | Freq: Two times a day (BID) | ORAL | 0 refills | Status: AC

## 2024-05-26 NOTE — Addendum Note (Signed)
 Addended by: Lacy Taglieri E on: 05/26/2024 04:09 PM   Modules accepted: Orders

## 2024-06-16 ENCOUNTER — Ambulatory Visit (HOSPITAL_COMMUNITY)
Admission: RE | Admit: 2024-06-16 | Discharge: 2024-06-16 | Disposition: A | Discharge status: Home / Self Care | Source: Ambulatory Visit | Attending: Cardiology | Admitting: Cardiology

## 2024-06-16 DIAGNOSIS — I25118 Atherosclerotic heart disease of native coronary artery with other forms of angina pectoris: Secondary | ICD-10-CM | POA: Diagnosis not present

## 2024-06-16 DIAGNOSIS — R0602 Shortness of breath: Secondary | ICD-10-CM | POA: Insufficient documentation

## 2024-06-16 DIAGNOSIS — R531 Weakness: Secondary | ICD-10-CM | POA: Insufficient documentation

## 2024-06-16 DIAGNOSIS — I10 Essential (primary) hypertension: Secondary | ICD-10-CM | POA: Diagnosis not present

## 2024-06-16 LAB — ECHOCARDIOGRAM COMPLETE
AR max vel: 2.22 cm2
AV Area VTI: 2.01 cm2
AV Area mean vel: 2.2 cm2
AV Mean grad: 2 mmHg
AV Peak grad: 3.1 mmHg
Ao pk vel: 0.88 m/s
Area-P 1/2: 3.65 cm2
MV M vel: 3.65 m/s
MV Peak grad: 53.3 mmHg
S' Lateral: 2.34 cm

## 2024-06-20 ENCOUNTER — Ambulatory Visit: Payer: Self-pay | Admitting: Cardiology

## 2024-06-20 ENCOUNTER — Other Ambulatory Visit: Payer: Self-pay | Admitting: Physician Assistant

## 2024-06-20 DIAGNOSIS — I25118 Atherosclerotic heart disease of native coronary artery with other forms of angina pectoris: Secondary | ICD-10-CM

## 2024-06-20 DIAGNOSIS — I480 Paroxysmal atrial fibrillation: Secondary | ICD-10-CM

## 2024-06-20 DIAGNOSIS — Z7901 Long term (current) use of anticoagulants: Secondary | ICD-10-CM

## 2024-06-20 DIAGNOSIS — I10 Essential (primary) hypertension: Secondary | ICD-10-CM

## 2024-06-20 MED ORDER — APIXABAN 5 MG PO TABS: 5.0000 mg | ORAL_TABLET | Freq: Two times a day (BID) | ORAL | 5 refills | Status: DC

## 2024-06-20 NOTE — Telephone Encounter (Signed)
 Eliquis  5mg  refill request received. Patient is 83  years old, weight-67.1kg, Crea-1.27 on via , Diagnosis-Afib, and last seen by Katlyn West on 05/17/24. Dose is appropriate based on dosing criteria. Will send in refill to requested pharmacy.

## 2024-06-26 ENCOUNTER — Other Ambulatory Visit

## 2024-06-26 DIAGNOSIS — E039 Hypothyroidism, unspecified: Secondary | ICD-10-CM | POA: Diagnosis not present

## 2024-06-26 LAB — TSH: TSH: 3.95 u[IU]/mL (ref 0.35–5.50)

## 2024-06-27 ENCOUNTER — Ambulatory Visit: Payer: Self-pay | Admitting: Family

## 2024-06-27 NOTE — Progress Notes (Signed)
 Called pt unable to leave vm. Please let pt know this at time of call back.

## 2024-06-27 NOTE — Telephone Encounter (Signed)
 Copied from CRM [id number]. Topic: Clinical - Lab/Test Results >> Jun 27, 2024 11:33 AM Thersia BROCKS wrote: Reason for CRM: Patient called in regarding a missed call, relay results that tsh is normal.. patient understood and had no further questions

## 2024-07-02 ENCOUNTER — Observation Stay (HOSPITAL_COMMUNITY)

## 2024-07-02 ENCOUNTER — Observation Stay (HOSPITAL_COMMUNITY)
Admission: EM | Admit: 2024-07-02 | Discharge: 2024-07-03 | Disposition: A | Discharge status: Home / Self Care | Attending: Emergency Medicine | Admitting: Emergency Medicine

## 2024-07-02 ENCOUNTER — Emergency Department (HOSPITAL_COMMUNITY)

## 2024-07-02 ENCOUNTER — Encounter (HOSPITAL_COMMUNITY): Payer: Self-pay | Admitting: *Deleted

## 2024-07-02 ENCOUNTER — Other Ambulatory Visit: Payer: Self-pay

## 2024-07-02 DIAGNOSIS — R29704 NIHSS score 4: Secondary | ICD-10-CM

## 2024-07-02 DIAGNOSIS — R471 Dysarthria and anarthria: Principal | ICD-10-CM | POA: Insufficient documentation

## 2024-07-02 DIAGNOSIS — R4701 Aphasia: Secondary | ICD-10-CM | POA: Diagnosis not present

## 2024-07-02 DIAGNOSIS — F419 Anxiety disorder, unspecified: Secondary | ICD-10-CM | POA: Insufficient documentation

## 2024-07-02 DIAGNOSIS — R2681 Unsteadiness on feet: Secondary | ICD-10-CM | POA: Insufficient documentation

## 2024-07-02 DIAGNOSIS — E039 Hypothyroidism, unspecified: Secondary | ICD-10-CM | POA: Insufficient documentation

## 2024-07-02 DIAGNOSIS — I700 Atherosclerosis of aorta: Secondary | ICD-10-CM | POA: Diagnosis not present

## 2024-07-02 DIAGNOSIS — Z8673 Personal history of transient ischemic attack (TIA), and cerebral infarction without residual deficits: Secondary | ICD-10-CM | POA: Insufficient documentation

## 2024-07-02 DIAGNOSIS — R0989 Other specified symptoms and signs involving the circulatory and respiratory systems: Secondary | ICD-10-CM | POA: Diagnosis not present

## 2024-07-02 DIAGNOSIS — N1832 Chronic kidney disease, stage 3b: Secondary | ICD-10-CM | POA: Diagnosis not present

## 2024-07-02 DIAGNOSIS — I639 Cerebral infarction, unspecified: Secondary | ICD-10-CM | POA: Diagnosis not present

## 2024-07-02 DIAGNOSIS — I480 Paroxysmal atrial fibrillation: Secondary | ICD-10-CM | POA: Diagnosis not present

## 2024-07-02 DIAGNOSIS — I6529 Occlusion and stenosis of unspecified carotid artery: Secondary | ICD-10-CM | POA: Diagnosis present

## 2024-07-02 DIAGNOSIS — I6782 Cerebral ischemia: Secondary | ICD-10-CM | POA: Diagnosis not present

## 2024-07-02 DIAGNOSIS — I129 Hypertensive chronic kidney disease with stage 1 through stage 4 chronic kidney disease, or unspecified chronic kidney disease: Secondary | ICD-10-CM | POA: Diagnosis not present

## 2024-07-02 DIAGNOSIS — G319 Degenerative disease of nervous system, unspecified: Secondary | ICD-10-CM | POA: Diagnosis not present

## 2024-07-02 DIAGNOSIS — E785 Hyperlipidemia, unspecified: Secondary | ICD-10-CM | POA: Diagnosis not present

## 2024-07-02 DIAGNOSIS — Z79899 Other long term (current) drug therapy: Secondary | ICD-10-CM | POA: Insufficient documentation

## 2024-07-02 DIAGNOSIS — I25118 Atherosclerotic heart disease of native coronary artery with other forms of angina pectoris: Secondary | ICD-10-CM | POA: Diagnosis present

## 2024-07-02 DIAGNOSIS — Z7901 Long term (current) use of anticoagulants: Secondary | ICD-10-CM | POA: Diagnosis not present

## 2024-07-02 DIAGNOSIS — R2689 Other abnormalities of gait and mobility: Secondary | ICD-10-CM | POA: Diagnosis not present

## 2024-07-02 DIAGNOSIS — R4182 Altered mental status, unspecified: Secondary | ICD-10-CM | POA: Diagnosis not present

## 2024-07-02 DIAGNOSIS — R29818 Other symptoms and signs involving the nervous system: Secondary | ICD-10-CM | POA: Diagnosis not present

## 2024-07-02 DIAGNOSIS — I160 Hypertensive urgency: Secondary | ICD-10-CM | POA: Diagnosis present

## 2024-07-02 DIAGNOSIS — I10 Essential (primary) hypertension: Secondary | ICD-10-CM | POA: Diagnosis not present

## 2024-07-02 LAB — COMPREHENSIVE METABOLIC PANEL WITH GFR
ALT: 17 U/L (ref 0–44)
AST: 34 U/L (ref 15–41)
Albumin: 3.8 g/dL (ref 3.5–5.0)
Alkaline Phosphatase: 64 U/L (ref 38–126)
Anion gap: 14 (ref 5–15)
BUN: 19 mg/dL (ref 8–23)
CO2: 22 mmol/L (ref 22–32)
Calcium: 9.5 mg/dL (ref 8.9–10.3)
Chloride: 103 mmol/L (ref 98–111)
Creatinine, Ser: 1.29 mg/dL — ABNORMAL HIGH (ref 0.44–1.00)
GFR, Estimated: 41 mL/min — ABNORMAL LOW (ref >60)
Glucose, Bld: 99 mg/dL (ref 70–99)
Potassium: 3.7 mmol/L (ref 3.5–5.1)
Sodium: 139 mmol/L (ref 135–145)
Total Bilirubin: 0.9 mg/dL (ref 0.0–1.2)
Total Protein: 7.3 g/dL (ref 6.5–8.1)

## 2024-07-02 LAB — I-STAT CHEM 8, ED
BUN: 20 mg/dL (ref 8–23)
Calcium, Ion: 1.06 mmol/L — ABNORMAL LOW (ref 1.15–1.40)
Chloride: 105 mmol/L (ref 98–111)
Creatinine, Ser: 1.3 mg/dL — ABNORMAL HIGH (ref 0.44–1.00)
Glucose, Bld: 98 mg/dL (ref 70–99)
HCT: 40 % (ref 36.0–46.0)
Hemoglobin: 13.6 g/dL (ref 12.0–15.0)
Potassium: 3.6 mmol/L (ref 3.5–5.1)
Sodium: 139 mmol/L (ref 135–145)
TCO2: 22 mmol/L (ref 22–32)

## 2024-07-02 LAB — DIFFERENTIAL
Abs Immature Granulocytes: 0.02 K/uL (ref 0.00–0.07)
Basophils Absolute: 0.1 K/uL (ref 0.0–0.1)
Basophils Relative: 1 %
Eosinophils Absolute: 0.2 K/uL (ref 0.0–0.5)
Eosinophils Relative: 3 %
Immature Granulocytes: 0 %
Lymphocytes Relative: 40 %
Lymphs Abs: 3 K/uL (ref 0.7–4.0)
Monocytes Absolute: 0.6 K/uL (ref 0.1–1.0)
Monocytes Relative: 8 %
Neutro Abs: 3.6 K/uL (ref 1.7–7.7)
Neutrophils Relative %: 48 %

## 2024-07-02 LAB — URINALYSIS, ROUTINE W REFLEX MICROSCOPIC
Bacteria, UA: NONE SEEN
Bilirubin Urine: NEGATIVE
Glucose, UA: NEGATIVE mg/dL
Hgb urine dipstick: NEGATIVE
Ketones, ur: NEGATIVE mg/dL
Nitrite: NEGATIVE
Protein, ur: NEGATIVE mg/dL
Specific Gravity, Urine: 1.025 (ref 1.005–1.030)
pH: 8 (ref 5.0–8.0)

## 2024-07-02 LAB — CBC
HCT: 40.7 % (ref 36.0–46.0)
Hemoglobin: 13.5 g/dL (ref 12.0–15.0)
MCH: 29.3 pg (ref 26.0–34.0)
MCHC: 33.2 g/dL (ref 30.0–36.0)
MCV: 88.5 fL (ref 80.0–100.0)
Platelets: 163 K/uL (ref 150–400)
RBC: 4.6 MIL/uL (ref 3.87–5.11)
RDW: 13.4 % (ref 11.5–15.5)
WBC: 7.5 K/uL (ref 4.0–10.5)
nRBC: 0 % (ref 0.0–0.2)

## 2024-07-02 LAB — PROTIME-INR
INR: 1.1 (ref 0.8–1.2)
Prothrombin Time: 14.9 s (ref 11.4–15.2)

## 2024-07-02 LAB — CBG MONITORING, ED: Glucose-Capillary: 98 mg/dL (ref 70–99)

## 2024-07-02 LAB — ETHANOL: Alcohol, Ethyl (B): <15 mg/dL (ref <15)

## 2024-07-02 LAB — APTT: aPTT: 26 s (ref 24–36)

## 2024-07-02 MED ORDER — IOHEXOL 350 MG/ML SOLN
75.0000 mL | Freq: Once | INTRAVENOUS | Status: AC | PRN
  Administered 2024-07-02: 75 mL via INTRAVENOUS

## 2024-07-02 MED ORDER — ACETAMINOPHEN 650 MG RE SUPP: 650.0000 mg | RECTAL | Status: DC | PRN

## 2024-07-02 MED ORDER — METOPROLOL SUCCINATE ER 25 MG PO TB24
25.0000 mg | ORAL_TABLET | Freq: Two times a day (BID) | ORAL | Status: DC
  Administered 2024-07-02: 25 mg via ORAL
  Filled 2024-07-02 (×2): qty 1

## 2024-07-02 MED ORDER — STROKE: EARLY STAGES OF RECOVERY BOOK
Freq: Once | Status: AC
  Filled 2024-07-02: qty 1

## 2024-07-02 MED ORDER — ROSUVASTATIN CALCIUM 20 MG PO TABS
20.0000 mg | ORAL_TABLET | Freq: Every day | ORAL | Status: DC
  Administered 2024-07-03: 20 mg via ORAL
  Filled 2024-07-02: qty 1

## 2024-07-02 MED ORDER — ACETAMINOPHEN 325 MG PO TABS: 650.0000 mg | ORAL_TABLET | ORAL | Status: DC | PRN

## 2024-07-02 MED ORDER — LEVOTHYROXINE SODIUM 100 MCG PO TABS
100.0000 ug | ORAL_TABLET | Freq: Every day | ORAL | Status: DC
  Administered 2024-07-03: 100 ug via ORAL
  Filled 2024-07-02: qty 1

## 2024-07-02 MED ORDER — LOSARTAN POTASSIUM 50 MG PO TABS
25.0000 mg | ORAL_TABLET | Freq: Every day | ORAL | Status: DC
  Administered 2024-07-02: 25 mg via ORAL
  Filled 2024-07-02: qty 1

## 2024-07-02 MED ORDER — ALPRAZOLAM 0.25 MG PO TABS
0.5000 mg | ORAL_TABLET | Freq: Every evening | ORAL | Status: DC | PRN
Start: 2024-07-02 — End: 2024-07-03
  Administered 2024-07-02: 0.5 mg via ORAL
  Filled 2024-07-02: qty 2

## 2024-07-02 MED ORDER — ACETAMINOPHEN 325 MG PO TABS
650.0000 mg | ORAL_TABLET | Freq: Once | ORAL | Status: AC
  Administered 2024-07-02: 650 mg via ORAL
  Filled 2024-07-02: qty 2

## 2024-07-02 MED ORDER — SENNOSIDES-DOCUSATE SODIUM 8.6-50 MG PO TABS: 1.0000 | ORAL_TABLET | Freq: Every evening | ORAL | Status: DC | PRN

## 2024-07-02 MED ORDER — ACETAMINOPHEN 160 MG/5ML PO SOLN: 650.0000 mg | ORAL | Status: DC | PRN

## 2024-07-02 MED ORDER — SODIUM CHLORIDE 0.9% FLUSH: 3.0000 mL | Freq: Once | INTRAVENOUS | Status: DC

## 2024-07-02 NOTE — ED Notes (Signed)
 The pt vomited approx  of undigested food  and liquid

## 2024-07-02 NOTE — ED Notes (Signed)
 According to the admitting doctors orders the pts nih are due every 4 hours not every 2 hours

## 2024-07-02 NOTE — ED Notes (Signed)
Husband and daughter at the bedside

## 2024-07-02 NOTE — ED Notes (Signed)
To mri 

## 2024-07-02 NOTE — ED Provider Notes (Signed)
 [place] EMERGENCY DEPARTMENT AT [facility] Provider Note   CSN: [id number] Arrival date & time: 07/02/24  1616     Patient presents with: Code Stroke   Colleen Wright is a 83 y.o. female.   Patient with history of atrial fibrillation on Eliquis , unknown last dose taken, remote h/o CVA presents as Code Stroke with last seen normal 3:00 pm by family. Per EMS report, the family was sitting together when her speech changed and there was ? Mouth drooping. Per EMS, her mental status declined in route described as becoming more confused. Family currently unavailable for additional information.  The history is provided by the patient. No language interpreter was used.       Prior to Admission medications   Medication Sig Start Date End Date Taking? Authorizing Provider  ALPRAZolam  (XANAX ) 0.5 MG tablet TAKE 1 TABLET BY MOUTH EVERYDAY AT BEDTIME 05/23/24   Tabori, Katherine E, MD  apixaban  (ELIQUIS ) 5 MG TABS tablet Take 1 tablet (5 mg total) by mouth 2 (two) times daily. 06/20/24   Croitoru, Mihai, MD  ELIQUIS  5 MG TABS tablet TAKE 1 TABLET BY MOUTH TWICE A DAY Patient taking differently: Take 5 mg by mouth daily. 06/15/23   Duke, Jon Garre, PA  levothyroxine  (SYNTHROID ) 100 MCG tablet Take 1 tablet (100 mcg total) by mouth daily. 05/24/24   Tabori, Katherine E, MD  losartan  (COZAAR ) 25 MG tablet Take 1 tablet (25 mg total) by mouth at bedtime. 05/17/24   West, Katlyn D, NP  meclizine  (ANTIVERT ) 25 MG tablet Take 1 tablet (25 mg total) by mouth 3 (three) times daily as needed for dizziness. 05/11/20   Rojelio Nest, DO  metoprolol  succinate (TOPROL -XL) 50 MG 24 hr tablet Take 0.5 tablets (25 mg total) by mouth 2 (two) times daily. Take with or immediately following a meal. 10/11/23   Duke, Jon Garre, PA  nitroGLYCERIN  (NITROSTAT ) 0.4 MG SL tablet Place 1 tablet (0.4 mg total) under the tongue every 5 (five) minutes as needed. 12/07/22 05/23/24  Madie Jon Garre, PA  potassium  chloride SA (KLOR-CON  M) 20 MEQ tablet Take 1 tablet (20 mEq total) by mouth daily for 7 days. 05/16/24 05/23/24  Minnie Tinnie BRAVO, PA  rosuvastatin  (CRESTOR ) 20 MG tablet Take 1 tablet (20 mg total) by mouth daily. 04/12/24   Croitoru, Mihai, MD    Allergies: Amoxicillin    Review of Systems  Updated Vital Signs BP (!) 201/87   Pulse 72   Temp 98.4 F (36.9 C) (Oral)   Resp 18   Ht 5' 2 (1.575 m)   Wt 69.8 kg   SpO2 100%   BMI 28.15 kg/m   Physical Exam Vitals and nursing note reviewed.  Constitutional:      General: She is not in acute distress.    Appearance: Normal appearance.  HENT:     Head: Normocephalic.     Mouth/Throat:     Mouth: Mucous membranes are moist.  Eyes:     Extraocular Movements: Extraocular movements intact.     Pupils: Pupils are equal, round, and reactive to light.  Neck:     Vascular: No carotid bruit.  Cardiovascular:     Rate and Rhythm: Normal rate.     Heart sounds: No murmur heard. Pulmonary:     Effort: Pulmonary effort is normal.  Abdominal:     General: There is no distension.     Palpations: Abdomen is soft.     Tenderness: There is no  abdominal tenderness.  Musculoskeletal:        General: Normal range of motion.     Cervical back: Normal range of motion and neck supple.  Skin:    General: Skin is warm and dry.  Neurological:     Mental Status: She is alert.     GCS: GCS eye subscore is 4. GCS verbal subscore is 4. GCS motor subscore is 6.     Cranial Nerves: No facial asymmetry.     Sensory: Sensation is intact. No sensory deficit.     Motor: Motor function is intact. No weakness (No lateralizing weakness).     Deep Tendon Reflexes:     Reflex Scores:      Patellar reflexes are 1+ on the right side and 2+ on the left side.    Comments: Confused as to year, oriented to person     (all labs ordered are listed, but only abnormal results are displayed) Labs Reviewed  COMPREHENSIVE METABOLIC PANEL WITH GFR - Abnormal; Notable  for the following components:      Result Value   Creatinine, Ser 1.29 (*)    GFR, Estimated 41 (*)    All other components within normal limits  I-STAT CHEM 8, ED - Abnormal; Notable for the following components:   Creatinine, Ser 1.30 (*)    Calcium , Ion 1.06 (*)    All other components within normal limits  PROTIME-INR  APTT  CBC  DIFFERENTIAL  ETHANOL  HEMOGLOBIN A1C  URINALYSIS, ROUTINE W REFLEX MICROSCOPIC  LIPID PANEL  CBG MONITORING, ED    EKG: None  Radiology: CT ANGIO HEAD NECK W WO CM (CODE STROKE) Result Date: 07/02/2024 EXAM: CTA HEAD AND NECK WITHOUT AND WITH 07/02/2024 04:40:38 PM TECHNIQUE: CTA of the head and neck was performed without and with the administration of 75 mL of iohexol (OMNIPAQUE) 350 MG/ML injection. Multiplanar 2D and/or 3D reformatted images are provided for review. Automated exposure control, iterative reconstruction, and/or weight based adjustment of the mA/kV was utilized to reduce the radiation dose to as low as reasonably achievable. Stenosis of the internal carotid arteries measured using NASCET criteria. COMPARISON: None available CLINICAL HISTORY: Neuro deficit, acute, stroke suspected. FINDINGS: CTA NECK: AORTIC ARCH AND ARCH VESSELS: Atherosclerotic calcifications are present at the aortic arch and origin of the left subclavian artery. The proximal left subclavian artery is narrowed to 2.8 mm. This compares to a more distal measurement of 7.9 mm, resulting in a 65% stenosis. No dissection or arterial injury. No other significant stenosis is present at the great vessel origins. CERVICAL CAROTID ARTERIES: Atherosclerotic changes are present at the right carotid bifurcation and proximal right ICA without significant stenosis relative to the more distal vessel. Atherosclerotic changes in the proximal left ICA narrowing of the lumen to 1.8 mm. This compares with the distal measurement of 4.9 mm, resulting in a 65% stenosis. No dissection or arterial  injury. CERVICAL VERTEBRAL ARTERIES: The vertebral arteries are codominant. Atherosclerotic changes are present at the dural margin of both vertebral arteries without significant stenosis. No dissection or arterial injury. LUNGS AND MEDIASTINUM: Unremarkable. SOFT TISSUES: No acute abnormality. BONES: No acute abnormality. CTA HEAD: ANTERIOR CIRCULATION: Atherosclerotic changes are present within the cavernous internal carotid arteries bilaterally without significant stenosis relative to the ICA terminate. The left A1 segment is hypoplastic. No significant stenosis of the anterior cerebral arteries. No significant stenosis of the middle cerebral arteries. No aneurysm. POSTERIOR CIRCULATION: No significant stenosis of the posterior cerebral arteries. No significant stenosis  of the basilar artery. No significant stenosis of the vertebral arteries. No aneurysm. OTHER: No dural venous sinus thrombosis on this non-dedicated study. IMPRESSION: 1. Proximal left subclavian artery stenosis estimated at 65%. 2. Proximal left internal carotid artery stenosis estimated at 65%. 3. Atherosclerotic changes at the right carotid bifurcation and proximal right ICA without significant stenosis. 4. Atherosclerotic changes at the dural margin of both vertebral arteries without significant stenosis. 5. Hypoplastic left A1 segment. 6. these results were communicated to Dr. Michaela at 4:40 PM on 07/02/2024 by secure text page via the [facility] messaging system. Electronically signed by: Lonni Necessary MD 07/02/2024 04:57 PM EST RP Workstation: [id number]   CT HEAD CODE STROKE WO CONTRAST Result Date: 07/02/2024 EXAM: CT HEAD WITHOUT CONTRAST 07/02/2024 04:27:33 PM TECHNIQUE: CT of the head was performed without the administration of intravenous contrast. Automated exposure control, iterative reconstruction, and/or weight based adjustment of the mA/kV was utilized to reduce the radiation dose to as low as reasonably achievable.  COMPARISON: None available. CLINICAL HISTORY: Neuro deficit, acute, stroke suspected. FINDINGS: BRAIN AND VENTRICLES: A lacunar infarct degenerative of the right internal capsule is new since the prior study but is CSF density and remote. Diffuse confluent periventricular and subcortical white matter hypoattenuation is present bilaterally. No acute cortical abnormalities present. Moderate generalized atrophy is present. No acute hemorrhage. No hydrocephalus. No extra-axial collection. No mass effect or midline shift. ORBITS: No acute abnormality. SINUSES: Chronic right maxillary sinus disease is present. SOFT TISSUES AND SKULL: No acute soft tissue abnormality. No skull fracture. [place] stroke program early CT score (aspects) ----- Ganglionic (caudate, IC, lentiform nucleus, insula, M1-M3): 7 Supraganglionic (M4-M6): 3 Total: 10 IMPRESSION: 1. 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 2. No acute intracranial abnormality. 3. Diffuse confluent periventricular and subcortical white matter disease bilaterally. 4. Moderate generalized cerebral atrophy. 5. Chronic right maxillary sinus disease. 6. ASPECTS: 10. 7. these results were communicated to Dr. Michaela at 4:40 PM on 07/02/2024 by secure text page via the [facility] messaging system. Electronically signed by: Lonni Necessary MD 07/02/2024 04:42 PM EST RP Workstation: [id number]     .Critical Care  Performed by: Odell Balls, PA-C Authorized by: Odell Balls, PA-C   Critical care provider statement:    Critical care time (minutes):  35   Critical care was necessary to treat or prevent imminent or life-threatening deterioration of the following conditions:  CNS failure or compromise   Critical care was time spent personally by me on the following activities:  Discussions with consultants, examination of patient, interpretation of cardiac output measurements, ordering and review of laboratory studies, ordering and  review of radiographic studies, pulse oximetry, re-evaluation of patient's condition and review of old charts    Medications Ordered in the ED  sodium chloride  flush (NS) 0.9 % injection 3 mL (has no administration in time range)   stroke: early stages of recovery book (has no administration in time range)  iohexol (OMNIPAQUE) 350 MG/ML injection 75 mL (75 mLs Intravenous Contrast Given 07/02/24 1640)                                    Medical Decision Making This patient presents to the ED for concern of aphasia, confusion, this involves an extensive number of treatment options, and is a complaint that carries with it a high risk of complications and morbidity.  The differential diagnosis includes acute stroke,  encephalopathy, sepsis   Co morbidities that complicate the patient evaluation  Previous stroke, CAD, HTN, PAF on Eliquis    Additional history obtained:  Additional history and/or information obtained from chart review, notable for    Lab Tests:  I Ordered, and personally interpreted labs.  The pertinent results include:   Results for orders placed or performed during the hospital encounter of 07/02/24 -CBG monitoring, ED:  Collection Time: 07/02/24  4:18 PM      Result                      Value             Ref Range          Glucose-Capillary           98                70 - 99 mg/dL      Comment 1                   Notify RN                            Comment 2                                                    Document in Chart -Protime-INR:  Collection Time: 07/02/24  4:20 PM      Result                      Value             Ref Range          Prothrombin Time            14.9              11.4 - 15.2 *      INR                         1.1               0.8 - 1.2     -APTT:  Collection Time: 07/02/24  4:20 PM      Result                      Value             Ref Range          aPTT                        26                24 - 36 seco* -CBC:  Collection  Time: 07/02/24  4:20 PM      Result                      Value             Ref Range          WBC                         7.5  4.0 - 10.5 K*      RBC                         4.60              3.87 - 5.11 *      Hemoglobin                  13.5              12.0 - 15.0 *      HCT                         40.7              36.0 - 46.0 %      MCV                         88.5              80.0 - 100.0*      MCH                         29.3              26.0 - 34.0 *      MCHC                        33.2              30.0 - 36.0 *      RDW                         13.4              11.5 - 15.5 %      Platelets                   163               150 - 400 K/*      nRBC                        0.0               0.0 - 0.2 %   -Differential:  Collection Time: 07/02/24  4:20 PM      Result                      Value             Ref Range          Neutrophils Relative %      48                %                  Neutro Abs                  3.6               1.7 - 7.7 K/*      Lymphocytes Relative        40                %  Lymphs Abs                  3.0               0.7 - 4.0 K/*      Monocytes Relative          8                 %                  Monocytes Absolute          0.6               0.1 - 1.0 K/*      Eosinophils Relative        3                 %                  Eosinophils Absolute        0.2               0.0 - 0.5 K/*      Basophils Relative          1                 %                  Basophils Absolute          0.1               0.0 - 0.1 K/*      Immature Granulocytes       0                 %                  Abs Immature Granulocy*     0.02              0.00 - 0.07 * -I-stat chem 8, ED:  Collection Time: 07/02/24  4:23 PM      Result                      Value             Ref Range          Sodium                      139               135 - 145 mm*      Potassium                   3.6               3.5 - 5.1 mm*      Chloride                     105               98 - 111 mmo*      BUN                         20                8 - 23 mg/dL       Creatinine, Ser  1.30 (H)          0.44 - 1.00 *      Glucose, Bld                98                70 - 99 mg/dL      Calcium , Ion                1.06 (L)          1.15 - 1.40 *      TCO2                        22                22 - 32 mmol*      Hemoglobin                  13.6              12.0 - 15.0 *      HCT                         40.0              36.0 - 46.0 % **Cr abnormal at 1.30, considered her baseline    Imaging Studies ordered:  I ordered imaging studies including CT head Per radiologist interpretation:  IMPRESSION: 1. 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 2. No acute intracranial abnormality. 3. Diffuse confluent periventricular and subcortical white matter disease bilaterally. 4. Moderate generalized cerebral atrophy. 5. Chronic right maxillary sinus disease. 6. ASPECTS: 10. 7. these results were communicated to Dr. Michaela at 4:40 PM on 07/02/2024 by secure text page via the [facility] messaging system.    Cardiac Monitoring:  The patient was maintained on a cardiac monitor.  I personally viewed and interpreted the cardiac monitored which showed an underlying rhythm of: Sinus rhythm, rate 68   Medicines ordered and prescription drug management:  I ordered medication including n/a  for n/a Reevaluation of the patient after these medicines showed that the patient n/a I have reviewed the patients home medicines and have made adjustments as needed   Test Considered:  Neurology to order MRI, CTA head and neck pending.    Critical Interventions:  Neurology at bedside on arrival History of PAF on Eliquis . Per Dr. Michaela, she is not a candidate for stroke interventions   Consultations Obtained:  I requested consultation with the Dr. Michaela, neurology,  and discussed lab and imaging findings  as well as pertinent plan - they recommend: admit to hospitalist   Problem List / ED Course:  BP 200/99 HR 69 Temp 98.8 RR 22 O2 sat 98%   Reevaluation:  After the interventions noted above, I reevaluated the patient and found that they have : N/a  Social Determinants of Health:  Lives with family, quit smoking 55 years ago   Disposition:  After consideration of the diagnostic results and the patients response to treatment, I feel that the patient would benefit from admit. Per neurology, she is not a candidate for stroke interventions and they defer to hospitalist to admit. Discussed with Dr. Tobie who accepts the patient for admission.   Amount and/or Complexity of Data Reviewed Labs: ordered. Radiology: ordered.  Risk Decision regarding hospitalization.        Final  diagnoses:  Dysarthria    ED Discharge Orders     None          Odell Balls, PA-C 07/02/24 1726    Elnor Bernarda SQUIBB, DO 07/05/24 0005

## 2024-07-02 NOTE — ED Triage Notes (Signed)
 Code stroke arrived with gems ems called out for the pt that stopped talking lsn 1500  when ems arrived the pt had lt facial droop aphasia . That resolved then began again at 1550enroute here   she takes eliquist at night and she take bp med. Alert on arrival  1618

## 2024-07-02 NOTE — ED Triage Notes (Signed)
Pt c/o a sl headache 

## 2024-07-02 NOTE — ED Notes (Signed)
 The pt passed her swallow screen  c/o a headache still

## 2024-07-02 NOTE — H&P (Signed)
 History and Physical  Patient: Colleen Wright FMW:[id number] DOB: 03/13/1941 DOA: 07/02/2024 DOS: the patient was seen and examined on 07/02/2024 Patient coming from: Home  Chief Complaint: Difficulty speaking.   HPI: Patient with PMH of CVA, paroxysmal A-fib, HTN, HLD, hypothyroidism. The patient presented to the hospital with complaints of sudden onset of difficulty speaking and numbness of her face. Patient was at her baseline when she woke up this morning. Around 3 PM she started having difficulty with numbness of her upper lip. This was followed by difficulty speaking. EMS was called given her prior history of acute stroke. At the time of my evaluation she reports that her symptoms are resolved although she still has some difficulty with word finding. She had some headache which is frontal in nature. She denies any dizziness right now. No nausea no vomiting no vision changes. No fall or trauma no injury reported. She reports that she is compliant with all her medications and has not missed any medications. She reports that for last 6 months she has been taking Eliquis  only on a daily basis.  Primarily due to insurance coverage issue. No smoking history.  No alcohol history.  Assessment and plan. Concern for acute CVA. Prior history of CVA with A-fib. Currently on anticoagulation. Not a candidate for tPA or any other intervention right now. CT head unremarkable. Neurology consulted. MRI pending. 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 Will follow neurorecommendation. PT OT consult. Patient passed swallow evaluation in the ED.  Will allow diet. SLP consulted for cognitive assessment.. Monitor on telemetry. 2D echo 06/17/2023 EF 6065%, no significant valvular abnormality.  Has no regional WMA.  Hypertensive urgency. Blood pressure significant elevated with systolic more than 200. She reports that recently her blood pressure  was very low at home. Currently will resume losartan  and metoprolol . Allow permissive hypertension. As needed IV hydralazine  for blood pressure systolic more than 220.  Anxiety. On Xanax . Continue.  Hypothyroidism. Synthroid . Continue 100 mcg.  HLD. Check LDL. Continue Crestor  20 mg daily.  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 Baseline serum creatinine between 1.2-1.4. Monitor renal function.  Advance Care Planning:   Code Status: Full Code discussed with the patient and confirmed. Consults: Neurology  Prior to Admission medications   Medication Sig Start Date End Date Taking? Authorizing Provider  ALPRAZolam  (XANAX ) 0.5 MG tablet TAKE 1 TABLET BY MOUTH EVERYDAY AT BEDTIME 05/23/24   Tabori, Katherine E, MD  apixaban  (ELIQUIS ) 5 MG TABS tablet Take 1 tablet (5 mg total) by mouth 2 (two) times daily. 06/20/24   Croitoru, Mihai, MD  ELIQUIS  5 MG TABS tablet TAKE 1 TABLET BY MOUTH TWICE A DAY Patient taking differently: Take 5 mg by mouth daily. 06/15/23   Duke, Jon Garre, PA  levothyroxine  (SYNTHROID ) 100 MCG tablet Take 1 tablet (100 mcg total) by mouth daily. 05/24/24   Tabori, Katherine E, MD  losartan  (COZAAR ) 25 MG tablet Take 1 tablet (25 mg total) by mouth at bedtime. 05/17/24   West, Katlyn D, NP  meclizine  (ANTIVERT ) 25 MG tablet Take 1 tablet (25 mg total) by mouth 3 (three) times daily as needed for dizziness. 05/11/20   Rojelio Nest, DO  metoprolol  succinate (TOPROL -XL) 50 MG 24 hr tablet Take 0.5 tablets (25 mg total) by mouth 2 (two) times daily. Take with or immediately following a meal. 10/11/23   Duke, Jon Garre, PA  nitroGLYCERIN  (NITROSTAT ) 0.4 MG SL tablet Place 1 tablet (0.4 mg total) under  the tongue every 5 (five) minutes as needed. 12/07/22 05/23/24  Duke, Jon Garre, PA  potassium chloride  SA (KLOR-CON  M) 20 MEQ tablet Take 1 tablet (20 mEq total) by mouth daily for 7 days. 05/16/24 05/23/24  Minnie Tinnie BRAVO, PA  rosuvastatin  (CRESTOR ) 20 MG tablet Take 1 tablet (20  mg total) by mouth daily. 04/12/24   Croitoru, Jerel, MD    Past Medical History:  Diagnosis Date   Adenomatous colon polyp 2006   Allergy    Arthritis    low back   Back pain 06/13/2011   CAD (coronary artery disease) 06/12/2011   Depression    GERD (gastroesophageal reflux disease)    Hiatal hernia    Hyperlipidemia    Hypertension    Hypothyroidism 06/12/2011   Leg fracture    Pain in joint, ankle and foot 11/02/2012   Pain of left heel 05/23/2012   Skin lesions, generalized 05/23/2012   Thyroid  cancer (HCC)    Ulcer    Vertigo 06/13/2011   Past Surgical History:  Procedure Laterality Date   BALLOON ANGIOPLASTY, ARTERY     CARDIAC CATHETERIZATION N/A 01/24/2016   Procedure: Left Heart Cath and Coronary Angiography;  Surgeon: Peter M Jordan, MD;  Location: [facility] INVASIVE CV LAB;  Service: Cardiovascular;  Laterality: N/A;   EP IMPLANTABLE DEVICE N/A 09/24/2015   Procedure: Loop Recorder Insertion;  Surgeon: Jerel Balding, MD;  Location: [facility] INVASIVE CV LAB;  Service: Cardiovascular;  Laterality: N/A;   FRACTURE SURGERY     left leg, pin and 5 bolts in place in lower tibia   LESION REMOVAL     vaginal   THYROIDECTOMY, PARTIAL     twice   TUBAL LIGATION     growth removed from posterior vaginal vault   Social History:  reports that she quit smoking about 55 years ago. Her smoking use included cigarettes. She started smoking about 60 years ago. She has a 1.3 pack-year smoking history. She has never used smokeless tobacco. She reports that she does not drink alcohol and does not use drugs. Allergies  Allergen Reactions   Amoxicillin Other (See Comments)    Patient develops a very bad yeast infection. Did it involve swelling of the face/tongue/throat, SOB, or low BP? No Did it involve sudden or severe rash/hives, skin peeling, or any reaction on the inside of your mouth or nose? No Did you need to seek medical attention at a hospital or doctor's office? No When did it last happen?       many years If all above answers are "NO", may proceed with cephalosporin use.    Family History  Problem Relation Age of Onset   Cancer Mother        ovary/uterus-can't remember   Stroke Mother    Heart disease Father    Heart disease Sister        bypass   Stroke Sister    Cancer Sister        LN, bone   Hypertension Sister    Heart disease Brother        bypass   Cancer Cousin        breast   Physical Exam: Vitals:   07/02/24 1600 07/02/24 1645 07/02/24 1656 07/02/24 1702  BP:  (!) 201/87    Pulse:  72    Resp:   18   Temp:   98.4 F (36.9 C)   TempSrc:   Oral   SpO2:  100%    Weight: 69.8  kg   69.8 kg  Height:    5' 2 (1.575 m)   Alert awake and oriented x 3. Clear to auscultation. S1-S2 present Bowel sounds present Nontender. No edema. No asterixis. Finger-nose-finger normal. Pupils are equal and reactive to light.  No nystagmus. Equal sensation bilaterally on face  Data Reviewed: I have reviewed ED notes, Vitals, Lab results and outpatient records. Since last encounter, pertinent lab results CBC and BMP   . I have ordered test including CBC and BMP  . I have discussed pt's care plan and test results with ED provider  .   Family Communication: No one at bedside  Author: Yetta Blanch, MD 07/02/2024 6:09 PM For on call review [email or web address].

## 2024-07-02 NOTE — Consult Note (Signed)
 NEUROLOGY CONSULT NOTE   Date of service: July 02, 2024 Patient Name: Colleen Wright MRN:  [id number] DOB:  Oct 19, 1940 Chief Complaint: Code Stroke Requesting Provider: Tobie Yetta HERO, MD  History of Present Illness  Colleen Wright is a 83 y.o. female with hx of paroxysmal atrial fibrillation on chronic anticoagulation, cryptogenic stroke, hypertension, CAD, carotid artery stenosis and hyperlipidemia presenting with a sudden onset of aphasia, and weird mouth movements.  She was sitting in the living room with her family when at 1500 she had a 10-minute episode of aphasia and EMS was called.  EMS noted weird mouth movements starting around 1550.  EMS states last dose of Eliquis  was last night.  Patient herself states that she takes her medications like she is supposed to, however she then states she did not take any medications this morning.  Blood pressure elevated at 201/87, glucose 98.  LKW: 1500 Modified rankin score: 3-Moderate disability-requires help but walks WITHOUT assistance IV Thrombolysis: No, on eliquis   EVT: no, no LVO   NIHSS components Score: Comment  1a Level of Conscious 0[x]  1[]  2[]  3[]      1b LOC Questions 0[]  1[]  2[x]       1c LOC Commands 0[]  1[x]  2[]       2 Best Gaze 0[x]  1[]  2[]       3 Visual 0[x]  1[]  2[]  3[]      4 Facial Palsy 0[x]  1[]  2[]  3[]      5a Motor Arm - left 0[x]  1[]  2[]  3[]  4[]  UN[]    5b Motor Arm - Right 0[x]  1[]  2[]  3[]  4[]  UN[]    6a Motor Leg - Left 0[x]  1[]  2[]  3[]  4[]  UN[]    6b Motor Leg - Right 0[x]  1[]  2[]  3[]  4[]  UN[]    7 Limb Ataxia 0[x]  1[]  2[]  UN[]      8 Sensory 0[x]  1[]  2[]  UN[]      9 Best Language 0[]  1[x]  2[]  3[]      10 Dysarthria 0[x]  1[]  2[]  UN[]      11 Extinct. and Inattention 0[x]  1[]  2[]       TOTAL:4       ROS  Comprehensive ROS performed and pertinent positives documented in HPI   Past History   Past Medical History:  Diagnosis Date   Adenomatous colon polyp 2006   Allergy    Arthritis    low back   Back pain  06/13/2011   CAD (coronary artery disease) 06/12/2011   Depression    GERD (gastroesophageal reflux disease)    Hiatal hernia    Hyperlipidemia    Hypertension    Hypothyroidism 06/12/2011   Leg fracture    Pain in joint, ankle and foot 11/02/2012   Pain of left heel 05/23/2012   Skin lesions, generalized 05/23/2012   Thyroid  cancer (HCC)    Ulcer    Vertigo 06/13/2011    Past Surgical History:  Procedure Laterality Date   BALLOON ANGIOPLASTY, ARTERY     CARDIAC CATHETERIZATION N/A 01/24/2016   Procedure: Left Heart Cath and Coronary Angiography;  Surgeon: Peter M Jordan, MD;  Location: [facility] INVASIVE CV LAB;  Service: Cardiovascular;  Laterality: N/A;   EP IMPLANTABLE DEVICE N/A 09/24/2015   Procedure: Loop Recorder Insertion;  Surgeon: Jerel Balding, MD;  Location: [facility] INVASIVE CV LAB;  Service: Cardiovascular;  Laterality: N/A;   FRACTURE SURGERY     left leg, pin and 5 bolts in place in lower tibia   LESION REMOVAL     vaginal   THYROIDECTOMY, PARTIAL  twice   TUBAL LIGATION     growth removed from posterior vaginal vault    Family History: Family History  Problem Relation Age of Onset   Cancer Mother        ovary/uterus-can't remember   Stroke Mother    Heart disease Father    Heart disease Sister        bypass   Stroke Sister    Cancer Sister        LN, bone   Hypertension Sister    Heart disease Brother        bypass   Cancer Cousin        breast    Social History  reports that she quit smoking about 55 years ago. Her smoking use included cigarettes. She started smoking about 60 years ago. She has a 1.3 pack-year smoking history. She has never used smokeless tobacco. She reports that she does not drink alcohol and does not use drugs.  Allergies  Allergen Reactions   Amoxicillin Other (See Comments)    Patient develops a very bad yeast infection. Did it involve swelling of the face/tongue/throat, SOB, or low BP? No Did it involve sudden or severe  rash/hives, skin peeling, or any reaction on the inside of your mouth or nose? No Did you need to seek medical attention at a hospital or doctor's office? No When did it last happen?      many years If all above answers are "NO", may proceed with cephalosporin use.     Medications   Current Facility-Administered Medications:    [START ON 07/03/2024]  stroke: early stages of recovery book, , Does not apply, Once, Remi Pippin, NP   sodium chloride  flush (NS) 0.9 % injection 3 mL, 3 mL, Intravenous, Once, Colleen Bernarda SQUIBB, DO  Current Outpatient Medications:    ALPRAZolam  (XANAX ) 0.5 MG tablet, TAKE 1 TABLET BY MOUTH EVERYDAY AT BEDTIME, Disp: 30 tablet, Rfl: 1   apixaban  (ELIQUIS ) 5 MG TABS tablet, Take 1 tablet (5 mg total) by mouth 2 (two) times daily., Disp: 60 tablet, Rfl: 5   ELIQUIS  5 MG TABS tablet, TAKE 1 TABLET BY MOUTH TWICE A DAY (Patient taking differently: Take 5 mg by mouth daily.), Disp: 180 tablet, Rfl: 1   levothyroxine  (SYNTHROID ) 100 MCG tablet, Take 1 tablet (100 mcg total) by mouth daily., Disp: 30 tablet, Rfl: 3   losartan  (COZAAR ) 25 MG tablet, Take 1 tablet (25 mg total) by mouth at bedtime., Disp: 90 tablet, Rfl: 3   meclizine  (ANTIVERT ) 25 MG tablet, Take 1 tablet (25 mg total) by mouth 3 (three) times daily as needed for dizziness., Disp: 30 tablet, Rfl: 0   metoprolol  succinate (TOPROL -XL) 50 MG 24 hr tablet, Take 0.5 tablets (25 mg total) by mouth 2 (two) times daily. Take with or immediately following a meal., Disp: 90 tablet, Rfl: 0   nitroGLYCERIN  (NITROSTAT ) 0.4 MG SL tablet, Place 1 tablet (0.4 mg total) under the tongue every 5 (five) minutes as needed., Disp: 25 tablet, Rfl: 3   potassium chloride  SA (KLOR-CON  M) 20 MEQ tablet, Take 1 tablet (20 mEq total) by mouth daily for 7 days., Disp: 7 tablet, Rfl: 0   rosuvastatin  (CRESTOR ) 20 MG tablet, Take 1 tablet (20 mg total) by mouth daily., Disp: 90 tablet, Rfl: 3  Vitals   Vitals:   07/02/24 1600 07/02/24  1645 07/02/24 1656 07/02/24 1702  BP:  (!) 201/87    Pulse:  72    Resp:  18   Temp:   98.4 F (36.9 C)   TempSrc:   Oral   SpO2:  100%    Weight: 69.8 kg   69.8 kg  Height:    5' 2 (1.575 m)    Body mass index is 28.15 kg/m.   Physical Exam   Constitutional: Appears well-developed and well-nourished.  Psych: Affect appropriate to situation.  Eyes: No scleral injection.  HENT: No OP obstruction.  Head: Normocephalic.  Cardiovascular: Normal rate and regular rhythm.  Respiratory: Effort normal, non-labored breathing.  GI: Soft.  No distension. There is no tenderness.  Skin: WDI.   Neurologic Examination    Neuro: Mental Status: Patient is awake, alert, states name but is unable to answer other orientation questions.  Her speech is not dysarthric.  In conversation her speech does not make sense, however she is able to read and identify objects correctly. Cranial Nerves: II: Visual field testing is inconsistent, she does identify fingers correctly most of the time.  She inconsistently blinks to threat. III,IV, VI: EOMI without ptosis or diploplia.  V: Facial sensation is symmetric to temperature VII: Facial movement is symmetric resting and smiling VIII: Hearing is intact to voice X: Palate elevates symmetrically XI: Shoulder shrug is symmetric. XII: Tongue protrudes midline without atrophy or fasciculations.  Motor: Tone is normal. Bulk is normal. 5/5 strength was present in all four extremities.  Sensory: Sensation is symmetric to light touch and temperature in the arms and legs. No extinction to DSS present.  Cerebellar: FNF and HKS are intact bilaterally  Labs/Imaging/Neurodiagnostic studies   CBC:  Recent Labs  Lab 07-15-2024 1620 July 15, 2024 1623  WBC 7.5  --   NEUTROABS 3.6  --   HGB 13.5 13.6  HCT 40.7 40.0  MCV 88.5  --   PLT 163  --    Basic Metabolic Panel:  Lab Results  Component Value Date   NA 139 07/15/2024   K 3.6 07-15-24   CO2 22  07-15-24   GLUCOSE 98 07/15/2024   BUN 20 July 15, 2024   CREATININE 1.30 (H) 07/15/2024   CALCIUM  9.5 07-15-24   GFRNONAA 41 (L) July 15, 2024   GFRAA >60 05/11/2020   Lipid Panel:  Lab Results  Component Value Date   LDLCALC 55 10/22/2023   HgbA1c:  Lab Results  Component Value Date   HGBA1C 5.6 05/06/2016   Urine Drug Screen:     Component Value Date/Time   LABOPIA NONE DETECTED 05/31/2021 1742   COCAINSCRNUR NONE DETECTED 05/31/2021 1742   LABBENZ NONE DETECTED 05/31/2021 1742   AMPHETMU NONE DETECTED 05/31/2021 1742   THCU NONE DETECTED 05/31/2021 1742   LABBARB NONE DETECTED 05/31/2021 1742    Alcohol Level     Component Value Date/Time   ETH 13 (H) 05/31/2021 1614   INR  Lab Results  Component Value Date   INR 1.1 07/15/2024   APTT  Lab Results  Component Value Date   APTT 26 2024-07-15   AED levels: No results found for: PHENYTOIN, ZONISAMIDE, LAMOTRIGINE, LEVETIRACETA  CT Head without contrast(Personally reviewed): 1. 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 2. No acute intracranial abnormality. 3. Diffuse confluent periventricular and subcortical white matter disease bilaterally. 4. Moderate generalized cerebral atrophy. 5. Chronic right maxillary sinus disease. 6. ASPECTS: 10.  CT angio Head and Neck with contrast(Personally reviewed): 1. Proximal left subclavian artery stenosis estimated at 65%. 2. Proximal left internal carotid artery stenosis estimated at 65%. 3. Atherosclerotic changes at  the right carotid bifurcation and proximal right ICA without significant stenosis. 4. Atherosclerotic changes at the dural margin of both vertebral arteries without significant stenosis. 5. Hypoplastic left A1 segment.  ASSESSMENT   DOYCE STONEHOUSE is a 83 y.o. female hx of paroxysmal atrial fibrillation on chronic anticoagulation, cryptogenic stroke, hypertension, CAD, carotid artery stenosis and hyperlipidemia  presenting with a sudden onset of aphasia, and weird mouth movements.  She was sitting in the living room with her family when at 1500 she had a 10-minute episode of aphasia and EMS was called.  EMS noted weird mouth movements starting around 1550.  EMS states last dose of Eliquis  was last night.  Blood pressure elevated at 201/87, glucose 98.  MRI ordered.  Complete stroke workup if MRI is positive for acute infarct.  Additionally recommend UA and chest x-ray.  Plan to admit to hospitalist service as she is still encephalopathic.  RECOMMENDATIONS  - UA, chest x-ray - Stroke work up  - HgbA1c, fasting lipid panel - MRI of the brain without contrast - Frequent neuro checks - Echocardiogram - Resume anticoagulation depending on MRI - Risk factor modification - Telemetry monitoring - PT consult, OT consult, Speech consult - Permissive hypertension for 24 to 48 hours BP goal less than 220/100 - Stroke team to follow  ______________________________________________________________________    Signed, Jorene Last, NP [facility]  Was present for the entirety of the evaluation and management reflected in the above note.  The patient has sudden onset aphasia with right facial weakness, I suspect she may have a right hemianopia as well, but at times appears to blink to threat from left side.  She does not give reliable answers.  Her symptoms are by far most consistent with acute ischemic stroke and she will need to be admitted with workup as above.  Aisha Seals, MD [facility]   If 7pm- 7am, please page neurology on call as listed in [facility]ION.

## 2024-07-02 NOTE — ED Notes (Signed)
Admitting doctor at the bedside 

## 2024-07-03 DIAGNOSIS — T45516A Underdosing of anticoagulants, initial encounter: Secondary | ICD-10-CM

## 2024-07-03 DIAGNOSIS — G459 Transient cerebral ischemic attack, unspecified: Secondary | ICD-10-CM | POA: Diagnosis not present

## 2024-07-03 DIAGNOSIS — Z7901 Long term (current) use of anticoagulants: Secondary | ICD-10-CM | POA: Diagnosis not present

## 2024-07-03 DIAGNOSIS — Z91141 Patient's other noncompliance with medication regimen due to financial hardship: Secondary | ICD-10-CM

## 2024-07-03 DIAGNOSIS — I10 Essential (primary) hypertension: Secondary | ICD-10-CM | POA: Diagnosis not present

## 2024-07-03 DIAGNOSIS — I480 Paroxysmal atrial fibrillation: Secondary | ICD-10-CM | POA: Diagnosis not present

## 2024-07-03 LAB — LIPID PANEL
Cholesterol: 109 mg/dL (ref 0–200)
HDL: 46 mg/dL (ref >40)
LDL Cholesterol: 50 mg/dL (ref 0–99)
Total CHOL/HDL Ratio: 2.4 ratio
Triglycerides: 65 mg/dL (ref <150)
VLDL: 13 mg/dL (ref 0–40)

## 2024-07-03 LAB — HEMOGLOBIN A1C
Hgb A1c MFr Bld: 5.1 % (ref 4.8–5.6)
Mean Plasma Glucose: 99.67 mg/dL

## 2024-07-03 MED ORDER — INFLUENZA VAC SPLIT HIGH-DOSE 0.5 ML IM SUSY: 0.5000 mL | PREFILLED_SYRINGE | INTRAMUSCULAR | Status: DC

## 2024-07-03 MED ORDER — APIXABAN 5 MG PO TABS
5.0000 mg | ORAL_TABLET | Freq: Two times a day (BID) | ORAL | Status: DC
  Administered 2024-07-03: 5 mg via ORAL
  Filled 2024-07-03: qty 1

## 2024-07-03 MED ORDER — APIXABAN 5 MG PO TABS
5.0000 mg | ORAL_TABLET | Freq: Two times a day (BID) | ORAL | 0 refills | Status: AC
Start: 2024-07-03 — End: ?

## 2024-07-03 MED ORDER — METOPROLOL SUCCINATE ER 50 MG PO TB24: 25.0000 mg | ORAL_TABLET | Freq: Every day | ORAL | 0 refills | Status: DC

## 2024-07-03 NOTE — Evaluation (Signed)
 Occupational Therapy Evaluation and Discharge Patient Details Name: Colleen Wright MRN: [id number] DOB: 1941/03/01 Today's Date: 07/03/2024   History of Present Illness   83 yo F adm 07/02/24 with aphasia, weird mouth movements, HTN, Rt internal capsule infarct. PMHx: Afib, cryptogenic CVA, HTN, CAD, carotid artery stenosis, HLD, thyroid  CA, vertigo     Clinical Impressions This 83 yo female admitted with above presents to acute OT with PLOF of being independent to Mod I (walking stick prn) and reporting she does stumble around and fall sometimes, but everyone does that. Currently is she back to her baseline but could benefit from OPPT (which the was recommended by evaluating PT); however pt tells me she does not want OPPT she can get stronger on her own and even with explaining how this would help her she stills states she does not want it. No further OT needs, we will D/C from acute OT.     If plan is discharge home, recommend the following:   A little help with walking and/or transfers     Functional Status Assessment   Patient has not had a recent decline in their functional status     Equipment Recommendations   None recommended by OT      Precautions/Restrictions   Precautions Precautions: Fall Recall of Precautions/Restrictions: Impaired (everybody stumbles and falls) Restrictions Weight Bearing Restrictions Per Provider Order: No     Mobility Bed Mobility Overal bed mobility: Independent                  Transfers Overall transfer level: Independent                        Balance Overall balance assessment: Mild deficits observed, not formally tested (one episode of gettting off balance where she did a step across pattern to catch herself and she held onto rails in hallway a couple of times)                                         ADL either performed or assessed with clinical judgement   ADL                                          General ADL Comments: overall at a S level due to decreased balance     Vision Baseline Vision/History: 1 Wears glasses Ability to See in Adequate Light: 0 Adequate Patient Visual Report: No change from baseline Vision Assessment?: Yes Ocular Range of Motion: Within Functional Limits Alignment/Gaze Preference: Within Defined Limits Tracking/Visual Pursuits: Able to track stimulus in all quads without difficulty Saccades: Within functional limits Convergence: Within functional limits Visual Fields: No apparent deficits            Pertinent Vitals/Pain Pain Assessment Pain Assessment: No/denies pain     Extremity/Trunk Assessment Upper Extremity Assessment Upper Extremity Assessment: Overall WFL for tasks assessed           Communication Communication Communication: No apparent difficulties   Cognition Arousal: Alert Behavior During Therapy: WFL for tasks assessed/performed               OT - Cognition Comments: decreased safety awareness and that everyone does not stumble and fall  Following commands: Intact       Cueing    Cueing Techniques: Verbal cues              Home Living Family/patient expects to be discharged to:: Private residence Living Arrangements: Spouse/significant other Available Help at Discharge: Family;Available PRN/intermittently Type of Home: House Home Access: Level entry     Home Layout: One level     Bathroom Shower/Tub: Producer, Television/film/video: Standard     Home Equipment: Shower seat;Grab bars - tub/shower;Hand held shower head   Additional Comments: husband at 90yo still works for the [employer] 2 days a week in water treatment area      Prior Functioning/Environment Prior Level of Function : Independent/Modified Independent             Mobility Comments: (P) walks to mailbox with cane but nothing inside ADLs Comments: Only  drives within [place]    OT Problem List: Impaired balance (sitting and/or standing)        OT Goals(Current goals can be found in the care plan section)   Acute Rehab OT Goals Patient Stated Goal: to go home today OT Goal Formulation: With patient         AM-PAC OT 6 Clicks Daily Activity     Outcome Measure Help from another person eating meals?: None Help from another person taking care of personal grooming?: A Little Help from another person toileting, which includes using toliet, bedpan, or urinal?: A Little Help from another person bathing (including washing, rinsing, drying)?: A Little Help from another person to put on and taking off regular upper body clothing?: A Little Help from another person to put on and taking off regular lower body clothing?: A Little 6 Click Score: 19   End of Session Equipment Utilized During Treatment: Gait belt  Activity Tolerance: Patient tolerated treatment well Patient left: in bed;with call bell/phone within reach  OT Visit Diagnosis: Unsteadiness on feet (R26.81);Other abnormalities of gait and mobility (R26.89)                Time: 8956-8884 OT Time Calculation (min): 32 min Charges:  OT General Charges $OT Visit: 1 Visit OT Evaluation $OT Eval Moderate Complexity: 1 Mod OT Treatments $Self Care/Home Management : 8-22 mins  Donny BECKER OT Acute Rehabilitation Services Office [phone]    [place] Dorothyann Distel 07/03/2024, 11:50 AM

## 2024-07-03 NOTE — ED Notes (Signed)
 Walked with pt to restroom. Pt up eating breakfast

## 2024-07-03 NOTE — Progress Notes (Addendum)
 STROKE TEAM PROGRESS NOTE   INTERIM HISTORY/SUBJECTIVE Patient presented with transient right facial weakness and speech difficulties.  MRI is negative for acute stroke. Patient has remained hemodynamically stable and afebrile overnight.  She reports that she was taking her Eliquis  once daily due to cost but will now begin taking it twice daily.  OBJECTIVE  CBC    Component Value Date/Time   WBC 7.5 07/02/2024 1620   RBC 4.60 07/02/2024 1620   HGB 13.6 07/02/2024 1623   HGB 13.8 09/19/2007 1251   HCT 40.0 07/02/2024 1623   HCT 38.9 09/19/2007 1251   PLT 163 07/02/2024 1620   PLT 256 09/19/2007 1251   MCV 88.5 07/02/2024 1620   MCV 88.2 09/19/2007 1251   MCH 29.3 07/02/2024 1620   MCHC 33.2 07/02/2024 1620   RDW 13.4 07/02/2024 1620   RDW 12.8 09/19/2007 1251   LYMPHSABS 3.0 07/02/2024 1620   LYMPHSABS 2.6 09/19/2007 1251   MONOABS 0.6 07/02/2024 1620   MONOABS 0.5 09/19/2007 1251   EOSABS 0.2 07/02/2024 1620   EOSABS 0.2 09/19/2007 1251   BASOSABS 0.1 07/02/2024 1620   BASOSABS 0.0 09/19/2007 1251    BMET    Component Value Date/Time   NA 139 07/02/2024 1623   NA 140 12/07/2022 1444   K 3.6 07/02/2024 1623   CL 105 07/02/2024 1623   CO2 22 07/02/2024 1620   GLUCOSE 98 07/02/2024 1623   BUN 20 07/02/2024 1623   BUN 20 12/07/2022 1444   CREATININE 1.30 (H) 07/02/2024 1623   CREATININE 1.50 (H) 04/23/2023 1507   CALCIUM  9.5 07/02/2024 1620   EGFR 49 (L) 12/07/2022 1444   GFRNONAA 41 (L) 07/02/2024 1620    IMAGING past 24 hours MR BRAIN WO CONTRAST Result Date: 07/02/2024 EXAM: MRI BRAIN WITHOUT CONTRAST 07/02/2024 09:27:59 PM TECHNIQUE: Multiplanar multisequence MRI of the head/brain was performed without the administration of intravenous contrast. COMPARISON: CT head from earlier today. MRI head 05/10/2020 CLINICAL HISTORY: Neuro deficit, acute, stroke suspected FINDINGS: BRAIN AND VENTRICLES: No acute infarct. Remote right basal ganglia and left corona radiata  lacunar infarcts. Advanced T2/FLAIR hyperintensities of the white matter, compatible with chronic microvascular ischemic change. Cerebral atrophy. No intracranial hemorrhage. No mass. No midline shift. No hydrocephalus. Normal flow voids. 3 watch free. ORBITS: No acute abnormality. SINUSES AND MASTOIDS: Opacified right maxillary sinus with central T2 hypointensity. BONES AND SOFT TISSUES: Normal marrow signal. No acute soft tissue abnormality. IMPRESSION: 1. No acute intracranial abnormality. 2. Remote right basal ganglia and left corona radiata lacunar infarcts. 3. Advanced chronic microvascular ischemic disease. 4. 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 Electronically signed by: Gilmore Molt MD 07/02/2024 10:04 PM EST RP Workstation: [id number]   DG CHEST PORT 1 VIEW Result Date: 07/02/2024 EXAM: 1 VIEW(S) XRAY OF THE CHEST 07/02/2024 05:36:00 PM COMPARISON: 04/03/2019 CLINICAL HISTORY: Altered mental status. FINDINGS: LINES, TUBES AND DEVICES: A loop recorder projects over the left lower chest. LUNGS AND PLEURA: Relatively low lung volumes with crowding of bronchovascular structures. No focal pulmonary opacity. No pleural effusion. No pneumothorax. HEART AND MEDIASTINUM: Aortic atherosclerosis (ICD-10: I70.0). BONES AND SOFT TISSUES: No acute osseous abnormality. IMPRESSION: 1. No acute cardiopulmonary findings. Electronically signed by: Dayne Hassell MD 07/02/2024 05:41 PM EST RP Workstation: [id number]   CT ANGIO HEAD NECK W WO CM (CODE STROKE) Result Date: 07/02/2024 EXAM: CTA HEAD AND NECK WITHOUT AND WITH 07/02/2024 04:40:38 PM TECHNIQUE: CTA of the head and neck was performed without and with the administration  of 75 mL of iohexol (OMNIPAQUE) 350 MG/ML injection. Multiplanar 2D and/or 3D reformatted images are provided for review. Automated exposure control, iterative reconstruction, and/or weight based adjustment of the mA/kV was utilized to reduce the  radiation dose to as low as reasonably achievable. Stenosis of the internal carotid arteries measured using NASCET criteria. COMPARISON: None available CLINICAL HISTORY: Neuro deficit, acute, stroke suspected. FINDINGS: CTA NECK: AORTIC ARCH AND ARCH VESSELS: Atherosclerotic calcifications are present at the aortic arch and origin of the left subclavian artery. The proximal left subclavian artery is narrowed to 2.8 mm. This compares to a more distal measurement of 7.9 mm, resulting in a 65% stenosis. No dissection or arterial injury. No other significant stenosis is present at the great vessel origins. CERVICAL CAROTID ARTERIES: Atherosclerotic changes are present at the right carotid bifurcation and proximal right ICA without significant stenosis relative to the more distal vessel. Atherosclerotic changes in the proximal left ICA narrowing of the lumen to 1.8 mm. This compares with the distal measurement of 4.9 mm, resulting in a 65% stenosis. No dissection or arterial injury. CERVICAL VERTEBRAL ARTERIES: The vertebral arteries are codominant. Atherosclerotic changes are present at the dural margin of both vertebral arteries without significant stenosis. No dissection or arterial injury. LUNGS AND MEDIASTINUM: Unremarkable. SOFT TISSUES: No acute abnormality. BONES: No acute abnormality. CTA HEAD: ANTERIOR CIRCULATION: Atherosclerotic changes are present within the cavernous internal carotid arteries bilaterally without significant stenosis relative to the ICA terminate. The left A1 segment is hypoplastic. No significant stenosis of the anterior cerebral arteries. No significant stenosis of the middle cerebral arteries. No aneurysm. POSTERIOR CIRCULATION: No significant stenosis of the posterior cerebral arteries. No significant stenosis of the basilar artery. No significant stenosis of the vertebral arteries. No aneurysm. OTHER: No dural venous sinus thrombosis on this non-dedicated study. IMPRESSION: 1. Proximal  left subclavian artery stenosis estimated at 65%. 2. Proximal left internal carotid artery stenosis estimated at 65%. 3. Atherosclerotic changes at the right carotid bifurcation and proximal right ICA without significant stenosis. 4. Atherosclerotic changes at the dural margin of both vertebral arteries without significant stenosis. 5. Hypoplastic left A1 segment. 6. these results were communicated to Dr. Michaela at 4:40 PM on 07/02/2024 by secure text page via the [facility] messaging system. Electronically signed by: Lonni Necessary MD 07/02/2024 04:57 PM EST RP Workstation: [id number]   CT HEAD CODE STROKE WO CONTRAST Result Date: 07/02/2024 EXAM: CT HEAD WITHOUT CONTRAST 07/02/2024 04:27:33 PM TECHNIQUE: CT of the head was performed without the administration of intravenous contrast. Automated exposure control, iterative reconstruction, and/or weight based adjustment of the mA/kV was utilized to reduce the radiation dose to as low as reasonably achievable. COMPARISON: None available. CLINICAL HISTORY: Neuro deficit, acute, stroke suspected. FINDINGS: BRAIN AND VENTRICLES: A lacunar infarct degenerative of the right internal capsule is new since the prior study but is CSF density and remote. Diffuse confluent periventricular and subcortical white matter hypoattenuation is present bilaterally. No acute cortical abnormalities present. Moderate generalized atrophy is present. No acute hemorrhage. No hydrocephalus. No extra-axial collection. No mass effect or midline shift. ORBITS: No acute abnormality. SINUSES: Chronic right maxillary sinus disease is present. SOFT TISSUES AND SKULL: No acute soft tissue abnormality. No skull fracture. [place] stroke program early CT score (aspects) ----- Ganglionic (caudate, IC, lentiform nucleus, insula, M1-M3): 7 Supraganglionic (M4-M6): 3 Total: 10 IMPRESSION: 1. 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 2. No acute intracranial  abnormality. 3. Diffuse confluent periventricular and subcortical white  matter disease bilaterally. 4. Moderate generalized cerebral atrophy. 5. Chronic right maxillary sinus disease. 6. ASPECTS: 10. 7. these results were communicated to Dr. Michaela at 4:40 PM on 07/02/2024 by secure text page via the [facility] messaging system. Electronically signed by: Lonni Necessary MD 07/02/2024 04:42 PM EST RP Workstation: [id number]    Vitals:   07/03/24 0615 07/03/24 0930 07/03/24 0931 07/03/24 1118  BP: (!) 133/51  127/61 (!) 138/56  Pulse: 66  67 (!) 58  Resp: (!) 22  18 17   Temp:  98.5 F (36.9 C)  98.3 F (36.8 C)  TempSrc:  Oral  Oral  SpO2: 97%  99% 100%  Weight:      Height:         PHYSICAL EXAM General:  Alert, well-nourished, well-developed patient in no acute distress Psych:  Mood and affect appropriate for situation CV: Regular rate and rhythm on monitor Respiratory:  Regular, unlabored respirations on room air GI: Abdomen soft and nontender   NEURO:  Mental Status: AA&Ox3, patient is able to give clear and coherent history Speech/Language: speech is without dysarthria or aphasia.  Naming, repetition, fluency, and comprehension intact.  Cranial Nerves:  II: PERRL. Visual fields full.  III, IV, VI: EOMI. Eyelids elevate symmetrically.  VII: Face is symmetrical resting and smiling VIII: hearing intact to voice. IX, X: Phonation is normal.  XII: tongue is midline without fasciculations. Motor: 5/5 strength to all muscle groups tested.  Tone: is normal and bulk is normal Sensation- Intact to light touch bilaterally.  Coordination: FTN intact bilaterally Gait- deferred  Most Recent NIH 0   ASSESSMENT/PLAN  [name] is a 83 y.o. female with history of A-fib on Eliquis , cryptogenic stroke, hypertension, CAD, carotid artery stenosis and hyperlipidemia admitted for transient, acute onset aphasia with odd mouth movements.  Patient reports that she knew which she  wanted to say for about a 30-minute period but was unable to get her words out.  She reports no headache or other symptoms at that time and has full recall of the event.  MRI was negative for acute infarct, making TIA the most likely etiology of her symptoms.  She states she has been taking her Eliquis  once daily instead of twice due to cost but will now to take it twice daily as prescribed.  NIH on Admission 4  TIA: Left-hemispheric TIA Etiology: A-fib with suboptimal Eliquis  dosing Code stroke CT head remote lacunar infarct in right internal capsule, no acute intracranial abnormality, diffuse confluent periventricular and subcortical white matter disease CTA head & neck proximal left subclavian artery stenosis estimated at 65%, proximal left ICA stenosis at at 65%, atherosclerotic changes at right carotid bifurcation and proximal right ICA without significant stenosis, hypoplastic left A1 segment, atherosclerotic changes at dural margin of both vertebral arteries MRI no acute intracranial abnormality, remote right basal ganglia and left corona radiata lacunar infarcts, advanced chronic microvascular ischemic disease 2D Echo EF 60 to 65%, moderate asymmetric LVH of basal septal segment, mild mitral valve regurgitation, normal left atrial size, normal atrial septum LDL 50 HgbA1c 5.1 VTE prophylaxis -fully anticoagulated with Eliquis  Eliquis  (apixaban ) daily prior to admission, now on Eliquis  (apixaban ) daily  Therapy recommendations:  No follow up needed  Disposition: Home  Hx of Stroke/TIA Chronic lacunar infarcts seen on brain MRI  Atrial fibrillation Home Meds: Eliquis  5 mg twice daily, metoprolol  XL 50 mg daily Continue telemetry monitoring Continue anticoagulation with Eliquis   Hypertension Home meds: Losartan  25 mg daily Stable Maintain  normotension  Hyperlipidemia Home meds: Rosuvastatin  20 mg daily, resumed in hospital LDL 50, goal < 70 Continue statin at discharge  Other  Stroke Risk Factors Advanced age Coronary artery disease   Other Active Problems None  Hospital day # 0  Patient seen by NP with MD, MD to edit note as needed. Cortney E Everitt Clint Kill , MSN, AGACNP-BC [facility] See Amion for schedule and pager information 07/03/2024 2:23 PM   I have personally obtained history,examined this patient, reviewed notes, independently viewed imaging studies, participated in medical decision making and plan of care.ROS completed by me personally and pertinent positives fully documented  I have made any additions or clarifications directly to the above note. Agree with note above.  Patient with A-fib was taking Eliquis  only once a day due to cost issues developed left hemispheric TIA.  Patient counseled to be compliant with Eliquis  and taking twice daily.  Continue ongoing stroke workup.  Aggressive risk factor modification.  Discussed with patient and answered questions.  Discussed with Dr. Tobie   I personally spent a total of 50 minutes in the care of the patient today including getting/reviewing separately obtained history, performing a medically appropriate exam/evaluation, counseling and educating, placing orders, referring and communicating with other health care professionals, documenting clinical information in the EHR, independently interpreting results, and coordinating care.        Eather Popp, MD Medical Director [facility] Pager: [phone] 07/03/2024 4:37 PM  To contact Stroke Continuity provider, please refer to [email or web address]. After hours, contact General Neurology

## 2024-07-03 NOTE — ED Notes (Signed)
 Spoke to pt's husband on phone to arrange for someone to come pick pt up for d/c

## 2024-07-03 NOTE — Evaluation (Signed)
 Physical Therapy Evaluation Patient Details Name: Colleen Wright MRN: [id number] DOB: 04-Apr-1941 Today's Date: 07/03/2024  History of Present Illness  83 yo F adm 07/02/24 with aphasia, weird mouth movements, HTN, Rt internal capsule infarct. PMHx: Afib, cryptogenic CVA, HTN, CAD, carotid artery stenosis, HLD, thyroid  CA, vertigo  Clinical Impression  Pt pleasant stating she lives at home with 83 yo spouse and walks without AD in home but frequently stumbles or bumps into things. Pt with balance deficits noted this date with right veering with gait and CGA for safety without AD. Encouraged use of cane at all times as well as OPPT but pt states she just wants to see how I'll do first. Will follow acutely to maximize safety and balance.        If plan is discharge home, recommend the following: A little help with walking and/or transfers;Assist for transportation   Can travel by private vehicle        Equipment Recommendations None recommended by PT  Recommendations for Other Services       Functional Status Assessment Patient has had a recent decline in their functional status and demonstrates the ability to make significant improvements in function in a reasonable and predictable amount of time.     Precautions / Restrictions Precautions Precautions: Fall Recall of Precautions/Restrictions: Impaired      Mobility  Bed Mobility Overal bed mobility: Modified Independent                  Transfers Overall transfer level: Modified independent                      Ambulation/Gait Ambulation/Gait assistance: Contact guard assist Gait Distance (Feet): 300 Feet Assistive device: None Gait Pattern/deviations: Step-through pattern, Decreased stride length, Staggering right   Gait velocity interpretation: 1.31 - 2.62 ft/sec, indicative of limited community ambulator   General Gait Details: pt with continuous veering rt with gait, CGA and tactile cues to maintain  balance, assist for direction  Stairs            Wheelchair Mobility     Tilt Bed    Modified Rankin (Stroke Patients Only) Modified Rankin (Stroke Patients Only) Pre-Morbid Rankin Score: No symptoms Modified Rankin: Moderate disability     Balance Overall balance assessment: Needs assistance   Sitting balance-Leahy Scale: Good       Standing balance-Leahy Scale: Good                               Pertinent Vitals/Pain Pain Assessment Pain Assessment: No/denies pain    Home Living Family/patient expects to be discharged to:: Private residence Living Arrangements: Spouse/significant other Available Help at Discharge: Family;Available PRN/intermittently Type of Home: House Home Access: Level entry       Home Layout: One level Home Equipment: Shower seat;Grab bars - tub/shower;Hand held shower head Additional Comments: husband at 54yo still works for the [employer] 2 days a week in water treatment area    Prior Function Prior Level of Function : Independent/Modified Independent             Mobility Comments: (P) walks to mailbox with cane but nothing inside ADLs Comments: Only drives within [place]     Extremity/Trunk Assessment   Upper Extremity Assessment Upper Extremity Assessment: Overall WFL for tasks assessed    Lower Extremity Assessment Lower Extremity Assessment: Overall WFL for tasks assessed  Cervical / Trunk Assessment Cervical / Trunk Assessment: Kyphotic  Communication   Communication Communication: No apparent difficulties    Cognition Arousal: Alert Behavior During Therapy: WFL for tasks assessed/performed   PT - Cognitive impairments: Safety/Judgement, Problem solving                       PT - Cognition Comments: decreased awareness of safety, fall risk and function Following commands: Intact       Cueing Cueing Techniques: Verbal cues     General Comments      Exercises      Assessment/Plan    PT Assessment Patient needs continued PT services  PT Problem List Decreased activity tolerance;Decreased balance       PT Treatment Interventions DME instruction;Gait training;Stair training;Functional mobility training;Therapeutic activities;Cognitive remediation;Balance training    PT Goals (Current goals can be found in the Care Plan section)  Acute Rehab PT Goals Patient Stated Goal: return home PT Goal Formulation: With patient Time For Goal Achievement: 07/10/24 Potential to Achieve Goals: Fair    Frequency Min 2X/week     Co-evaluation               AM-PAC PT 6 Clicks Mobility  Outcome Measure Help needed turning from your back to your side while in a flat bed without using bedrails?: None Help needed moving from lying on your back to sitting on the side of a flat bed without using bedrails?: None Help needed moving to and from a bed to a chair (including a wheelchair)?: A Little Help needed standing up from a chair using your arms (e.g., wheelchair or bedside chair)?: A Little Help needed to walk in hospital room?: A Little Help needed climbing 3-5 steps with a railing? : A Little 6 Click Score: 20    End of Session Equipment Utilized During Treatment: Gait belt Activity Tolerance: Patient tolerated treatment well Patient left: in bed;with call bell/phone within reach Nurse Communication: Mobility status PT Visit Diagnosis: Other abnormalities of gait and mobility (R26.89);Unsteadiness on feet (R26.81)    Time: 8992-8974 PT Time Calculation (min) (ACUTE ONLY): 18 min   Charges:   PT Evaluation $PT Eval Low Complexity: 1 Low   PT General Charges $$ ACUTE PT VISIT: 1 Visit         Lenoard SQUIBB, PT Acute Rehabilitation Services Office: [phone]   Lenoard NOVAK Arya Boxley 07/03/2024, 12:05 PM

## 2024-07-04 ENCOUNTER — Telehealth: Payer: Self-pay

## 2024-07-04 ENCOUNTER — Ambulatory Visit: Payer: Self-pay

## 2024-07-04 NOTE — Telephone Encounter (Signed)
 There is no documentation of a UTI in the ER or hospital notes and no mention of antibiotics.  Her urine that was done in the ER did not appear to be infected.  If she is symptomatic, she will need an appt to be evaluated and we can recheck urine studies/culture

## 2024-07-04 NOTE — Telephone Encounter (Signed)
 Called patient and scheduled her HFU that was canceled again. Patient understand that urine was clean and she does not need abx right now. She said she will call back or go to UC if she has sx.

## 2024-07-04 NOTE — Telephone Encounter (Signed)
 FYI Only or Action Required?: Action required by provider: clinical question for provider and update on patient condition.  Patient was last seen in primary care on 05/23/2024 by Mahlon Comer BRAVO, MD.  Called Nurse Triage reporting Dysuria.  Symptoms began several days ago.  Interventions attempted: Nothing.  Symptoms are: unchanged.  Triage Disposition: See Physician Within 24 Hours  Patient/caregiver understands and will follow disposition?: Yes  Copied from CRM [id number]. Topic: Clinical - Red Word Triage >> Jul 04, 2024  1:29 PM Suzen RAMAN wrote: Red Word that prompted transfer to Nurse Triage:(+) UTI confirmed at hospital 07/02/24. Patient daughter in law would like to know if patient can be prescribed antibiotics since the hospital forgot to prescribe medications. Reason for Disposition  All other patients with painful urination  (Exception: [1] EITHER frequency or urgency AND [2] has on-call doctor.)  Answer Assessment - Initial Assessment Questions Patient admitted for stroke on 07/02/2024 States that antibiotics were supposed to be called in for UTI  Patient experiencing Burning with urination  [phone]- Channel Papandrea daughter's number  [facility] in [place].  Request for antibiotic to be called in or follow up call to discuss treatment  Protocols used: Urination Pain - [facility]

## 2024-07-04 NOTE — Telephone Encounter (Signed)
 Patient was at the ED on 07/02/24. Patient had a HFU scheduled and it was canceled because she mentioned a red word. Want to schedule patient again? She is requesting abx because ED did not send them in.

## 2024-07-04 NOTE — Transitions of Care (Post Inpatient/ED Visit) (Signed)
 07/04/2024  Name: Colleen Wright MRN: [id number] DOB: 12/21/1940  Today's TOC FU Call Status: Today's TOC FU Call Status:: Successful TOC FU Call Completed TOC FU Call Complete Date: 07/04/24  Patient's Name and Date of Birth confirmed. Name, DOB  Transition Care Management Follow-up Telephone Call Date of Discharge: 07/03/24 Discharge Facility: [facility]) Type of Discharge: Inpatient Admission Primary Inpatient Discharge Diagnosis:: cerebral infarction How have you been since you were released from the hospital?: Better Any questions or concerns?: No  Items Reviewed: Did you receive and understand the discharge instructions provided?: Yes Medications obtained,verified, and reconciled?: Yes (Medications Reviewed) Any new allergies since your discharge?: No Dietary orders reviewed?: Yes Do you have support at home?: No  Medications Reviewed Today: Medications Reviewed Today     Reviewed by Emmitt Pan, LPN (Licensed Practical Nurse) on 07/04/24 at 1324  Med List Status: <None>   Medication Order Taking? Sig Documenting Provider Last Dose Status Informant  ALPRAZolam  (XANAX ) 0.5 MG tablet [id number] Yes TAKE 1 TABLET BY MOUTH EVERYDAY AT BEDTIME  Patient taking differently: Take 0.25-0.5 mg by mouth at bedtime. TAKE 1 TABLET BY MOUTH EVERYDAY AT BEDTIME   Tabori, Katherine E, MD  Active Self, Pharmacy Records  apixaban  (ELIQUIS ) 5 MG TABS tablet [id number] Yes Take 1 tablet (5 mg total) by mouth 2 (two) times daily. Tobie Yetta HERO, MD  Active   levothyroxine  (SYNTHROID ) 100 MCG tablet [id number] Yes Take 1 tablet (100 mcg total) by mouth daily.  Patient taking differently: Take 100 mcg by mouth daily before supper.   Tabori, Katherine E, MD  Active Self, Pharmacy Records  losartan  (COZAAR ) 25 MG tablet [id number] Yes Take 1 tablet (25 mg total) by mouth at bedtime.  Patient taking differently: Take 25 mg by mouth daily before supper.   West, Katlyn D, NP  Active Self,  Pharmacy Records  metoprolol  succinate (TOPROL -XL) 50 MG 24 hr tablet [id number] Yes Take 0.5 tablets (25 mg total) by mouth daily. Take with or immediately following a meal. Tobie Yetta HERO, MD  Active   nitroGLYCERIN  (NITROSTAT ) 0.4 MG SL tablet [id number] Yes Place 1 tablet (0.4 mg total) under the tongue every 5 (five) minutes as needed. Madie Jon Garre, PA  Active Self, Pharmacy Records           Med Note LEOBARDO, NICOLE   Mon Jul 03, 2024  2:19 AM) RX expired; Patient has old Rx on hand  rosuvastatin  (CRESTOR ) 20 MG tablet [id number] Yes Take 1 tablet (20 mg total) by mouth daily.  Patient taking differently: Take 20 mg by mouth daily before supper.   Croitoru, Mihai, MD  Active Self, Pharmacy Records            Home Care and Equipment/Supplies: Were Home Health Services Ordered?: NA Any new equipment or medical supplies ordered?: NA  Functional Questionnaire: Do you need assistance with bathing/showering or dressing?: No Do you need assistance with meal preparation?: No Do you need assistance with eating?: No Do you have difficulty maintaining continence: No Do you need assistance with getting out of bed/getting out of a chair/moving?: No Do you have difficulty managing or taking your medications?: No  Follow up appointments reviewed: PCP Follow-up appointment confirmed?: Yes Date of PCP follow-up appointment?: 07/12/24 Follow-up Provider: [facility] Follow-up appointment confirmed?: Yes Date of Specialist follow-up appointment?: 07/17/24 Follow-Up Specialty Provider:: cardio Do you need transportation to your follow-up appointment?: No Do you understand care options if your condition(s) worsen?: Yes-patient verbalized  understanding    SIGNATURE Julian Lemmings, LPN [facility] Nurse Health Advisor Direct Dial [phone]

## 2024-07-10 NOTE — Progress Notes (Deleted)
 Cardiology Office Note    Date:  07/10/2024  ID:  Colleen Wright, DOB 1940/10/22, MRN [id number] PCP:  Mahlon Comer BRAVO, MD  Cardiologist:  Jerel Balding, MD  Electrophysiologist:  None   Chief Complaint: ***  History of Present Illness: Colleen Wright    Colleen Wright is a 83 y.o. female with visit-pertinent history of paroxysmal atrial fibrillation on chronic anticoagulation, cryptogenic stroke, hypertension, CAD, carotid artery stenosis and hyperlipidemia.   Patient previously suffered a stroke which led to implantable loop recorder which detected asymptomatic atrial fibrillation.  She has been on anticoagulation with Eliquis  5 mg twice daily.  Carotid artery duplex in December 2020 showed 50 to 69% stenosis in the left ICA.  Loop recorder has since reached end of service, while functional ILR showed very relatively low A-fib burden.   Patient had cardiac catheterization in June 2017 that showed moderate to severe RCA branch stenosis that was treated with medical therapy due to tortuosity of the proximal vessel.   Patient was hospitalized in 04/2020 vertigo and falls.  She was unable to care for herself at home.   Patient was last seen in clinic on 12/20/2023 by Jon Hails, PA.  Patient elected to continue with Eliquis  and was not started on Coumadin.  Patient's losartan  was discontinued and she was continued on 12.5 mg of hydrochlorothiazide  alone.   Patient was seen by Dr. Landy on 05/15/2024 with increased weakness over the past year however felt that symptoms had worsened over the past couple of months.  She also reported having trouble walking from the kitchen to the bathroom without sitting down, feeling dizzy at times and short of breath.  Reported intermittent chest pains, unable to identify frequency.  Dr. Landy called DOD, Dr. Mona who recommended discontinuation of hydrochlorothiazide  and follow-up in office.   Patient presented to the emergency room in 04/2024 for generalized weakness  that had gotten progressively worse in the past 2 weeks.  Had recently been diagnosed with a UTI and started on Keflex  had been on for 1 day.  Patient was found to be hypokalemic at 2.8, her HCTZ had been discontinued the day prior.  Patient was last seen in clinic on 05/17/2024 regarding increased fatigue.  Patient reported she been having worsening progressive fatigue, dizziness, lightheadedness and shortness of breath for the prior few months.  She reported that in the last 2 weeks it had progressively worsened.  She reported some improvement with discontinuation of hydrochlorothiazide .  She denied any palpitations, increased lower extremity edema, orthopnea or PND.  Patient reported that she was simply unable to walk long distances for increased shortness of breath and fatigue.  On review of patient's home medication bottles she had been taking a combination pill of losartan  50 mg with hydrochlorothiazide  12.5 mg in addition to an additional 12.5 mg of hydrochlorothiazide , patient was unaware that her losartan -HCTZ had been previously discontinued.  Patient's combination pill and hydrochlorothiazide  was discontinued, she was started on losartan  25 mg daily.   Echocardiogram on 06/16/2024 indicated LVEF 60 to 65%, no RWMA, moderate asymmetric LVH of the basal septal segment, RV systolic function was normal, RV mildly enlarged, mildly elevated PASP, mild mitral valve regurgitation, severe mitral annular calcification, aortic valve regurgitation is not visualized, aortic valve sclerosis present with no evidence of stenosis.  On 07/02/2024 patient presented to the emergency department with facial numbness, right-sided facial weakness and speech difficulties.  Patient was seen by neurology, MRI was negative for acute stroke patient reported she  had been taking her Eliquis  only once daily due to cost, was planning to resume twice daily.  It was felt that her symptoms were likely a TIA in origin.  CTA head and  neck proximal left subclavian artery stenosis estimated at 65%, proximal left ICA stenosis at 65%, atherosclerotic changes at right carotid bifurcation and proximal right ICA without significant stenosis.  Today she presents for follow-up.  She reports that she    HTN/fatigue: Blood pressure today  PAF:  CAD/shortness of breath:  LHC in June 2017 showed moderate to severe RCA branch stenosis that was treated with medical therapy due to tortuosity of the proximal vessel.   CVA: Patient with history of CVA.  She was recently admitted in 06/2024 with facial numbness, right sided facial weakness and speech difficulties.  MRI was negative for acute stroke.  Felt to be TIA.  She reported she had not been taking her Eliquis  twice daily as previously directed.  Labwork independently reviewed:   ROS: .   *** denies chest pain, shortness of breath, lower extremity edema, fatigue, palpitations, melena, hematuria, hemoptysis, diaphoresis, weakness, presyncope, syncope, orthopnea, and PND.  All other systems are reviewed and otherwise negative.  Studies Reviewed: Colleen Wright    EKG:  EKG is ordered today, personally reviewed, demonstrating ***     CV Studies: Cardiac studies reviewed are outlined and summarized above. Otherwise please see EMR for full report. Cardiac Studies & Procedures   ______________________________________________________________________________________________ CARDIAC CATHETERIZATION  CARDIAC CATHETERIZATION 01/24/2016  Conclusion  Prox RCA lesion, 20% stenosed.  Mid RCA lesion, 50% stenosed.  Dist RCA-1 lesion, 70% stenosed.  Dist RCA-2 lesion, 80% stenosed. The lesion was previously treated with angioplasty greater than two years ago.  Prox LAD to Mid LAD lesion, 30% stenosed.  Mid LAD lesion, 60% stenosed.  The left ventricular systolic function is normal.  1. Moderate 2 vessel CAD -60% mid LAD -70% distal RCA - 80% ostial PLOM 2. Normal LV function 3. Dilated  aortic root. 4. MAC  Plan: would recommend medical therapy. Her symptoms are not clearly anginal. She has been on no antianginal therapy. During her procedure she was noted to have episodes of tachycardia with HR to 130s. She is also hypertensive. The distal RCA lesion would be amenable to PCI but would be difficult due to marked tortuosity of the RCA. I will add metoprolol  25 mg bid and arrange follow up as outpatient.  Findings Coronary Findings Diagnostic  Dominance: Right  Left Anterior Descending Diffuse.  Right Coronary Artery  Discrete. Discrete. The lesion was previously treated with angioplasty greater than two years ago.  Intervention  No interventions have been documented.     ECHOCARDIOGRAM  ECHOCARDIOGRAM COMPLETE 06/16/2024  Narrative ECHOCARDIOGRAM REPORT    Patient Name:   Colleen Wright   Date of Exam: 06/16/2024 Medical Rec #:  [id number]       Height:       62.0 in Accession #:    [id number]      Weight:       148.0 lb Date of Birth:  August 28, 1940       BSA:          1.682 m Patient Age:    83 years        BP:           117/65 mmHg Patient Gender: F               HR:  63 bpm. Exam Location:  [address]  Procedure: 2D Echo, Cardiac Doppler and Color Doppler (Both Spectral and Color Flow Doppler were utilized during procedure).  Indications:    Coronary artery disease of native artery of native heart with stable angina pectoris [I25.118 (ICD-10-CM)]; Essential hypertension [I10 (ICD-10-CM)]; SOB (shortness of breath) [R06.02 (ICD-10-CM)]; Weakness [R53.1 (ICD-10-CM)]  History:        Patient has prior history of Echocardiogram examinations, most recent 08/10/2015. CAD; Risk Factors:Hypertension, Dyslipidemia and Former Smoker.  Sonographer:    Rosaline Fujisawa MHA, RDMS, RVT, RDCS Referring Phys: [id number] [facility] D Lawrence Mitch  IMPRESSIONS   1. Left ventricular ejection fraction, by estimation, is 60 to 65%. The left ventricle has normal  function. The left ventricle has no regional wall motion abnormalities. There is moderate asymmetric left ventricular hypertrophy of the basal-septal segment. Left ventricular diastolic parameters are indeterminate. 2. Right ventricular systolic function is normal. The right ventricular size is mildly enlarged. There is mildly elevated pulmonary artery systolic pressure. The estimated right ventricular systolic pressure is 36.9 mmHg. 3. The mitral valve is degenerative. Mild mitral valve regurgitation. Severe mitral annular calcification. MV gradients were not measured 4. The aortic valve is tricuspid. Aortic valve regurgitation is not visualized. Aortic valve sclerosis is present, with no evidence of aortic valve stenosis. 5. The inferior vena cava is normal in size with <50% respiratory variability, suggesting right atrial pressure of 8 mmHg.  FINDINGS Left Ventricle: Left ventricular ejection fraction, by estimation, is 60 to 65%. The left ventricle has normal function. The left ventricle has no regional wall motion abnormalities. The left ventricular internal cavity size was normal in size. There is moderate asymmetric left ventricular hypertrophy of the basal-septal segment. Left ventricular diastolic parameters are indeterminate.  Right Ventricle: The right ventricular size is mildly enlarged. No increase in right ventricular wall thickness. Right ventricular systolic function is normal. There is mildly elevated pulmonary artery systolic pressure. The tricuspid regurgitant velocity is 2.69 m/s, and with an assumed right atrial pressure of 8 mmHg, the estimated right ventricular systolic pressure is 36.9 mmHg.  Left Atrium: Left atrial size was normal in size.  Right Atrium: Right atrial size was normal in size.  Pericardium: There is no evidence of pericardial effusion.  Mitral Valve: The mitral valve is degenerative in appearance. Severe mitral annular calcification. Mild mitral valve  regurgitation.  Tricuspid Valve: The tricuspid valve is normal in structure. Tricuspid valve regurgitation is mild.  Aortic Valve: The aortic valve is tricuspid. Aortic valve regurgitation is not visualized. Aortic valve sclerosis is present, with no evidence of aortic valve stenosis. Aortic valve mean gradient measures 2.0 mmHg. Aortic valve peak gradient measures 3.1 mmHg. Aortic valve area, by VTI measures 2.01 cm.  Pulmonic Valve: The pulmonic valve was grossly normal. Pulmonic valve regurgitation is trivial.  Aorta: The aortic root and ascending aorta are structurally normal, with no evidence of dilitation.  Venous: The inferior vena cava is normal in size with less than 50% respiratory variability, suggesting right atrial pressure of 8 mmHg.  IAS/Shunts: The atrial septum is grossly normal.   LEFT VENTRICLE PLAX 2D LVIDd:         4.67 cm LVIDs:         2.34 cm LV PW:         0.96 cm LV IVS:        1.28 cm LVOT diam:     1.74 cm LV SV:         41 LV  SV Index:   24 LVOT Area:     2.38 cm   RIGHT VENTRICLE RV Basal diam:  3.84 cm RV Mid diam:    3.65 cm TAPSE (M-mode): 2.6 cm  LEFT ATRIUM             Index        RIGHT ATRIUM           Index LA diam:        4.49 cm 2.67 cm/m   RA Area:     16.80 cm LA Vol (A2C):   33.6 ml 19.98 ml/m  RA Volume:   39.40 ml  23.42 ml/m LA Vol (A4C):   42.2 ml 25.09 ml/m LA Biplane Vol: 40.7 ml 24.20 ml/m AORTIC VALVE AV Area (Vmax):    2.22 cm AV Area (Vmean):   2.20 cm AV Area (VTI):     2.01 cm AV Vmax:           88.30 cm/s AV Vmean:          59.500 cm/s AV VTI:            0.205 m AV Peak Grad:      3.1 mmHg AV Mean Grad:      2.0 mmHg LVOT Vmax:         82.30 cm/s LVOT Vmean:        55.000 cm/s LVOT VTI:          0.173 m LVOT/AV VTI ratio: 0.84  AORTA Ao Root diam: 3.11 cm Ao Asc diam:  3.64 cm  MITRAL VALVE                TRICUSPID VALVE MV Area (PHT): 3.65 cm     TR Peak grad:   28.9 mmHg MV Decel Time: 208  msec     TR Vmax:        269.00 cm/s MR Peak grad: 53.3 mmHg MR Vmax:      365.00 cm/s   SHUNTS MV E velocity: 92.20 cm/s   Systemic VTI:  0.17 m MV A velocity: 107.00 cm/s  Systemic Diam: 1.74 cm MV E/A ratio:  0.86  Lonni Nanas MD Electronically signed by Lonni Nanas MD Signature Date/Time: 06/16/2024/3:28:48 PM    Final          ______________________________________________________________________________________________       Current Reported Medications:.    No outpatient medications have been marked as taking for the 07/17/24 encounter (Appointment) with Zoiey Christy D, NP.    Physical Exam:    VS:  There were no vitals taken for this visit.   Wt Readings from Last 3 Encounters:  07/02/24 153 lb 14.1 oz (69.8 kg)  05/23/24 148 lb (67.1 kg)  05/17/24 145 lb (65.8 kg)    GEN: Well nourished, well developed in no acute distress NECK: No JVD; No carotid bruits CARDIAC: ***RRR, no murmurs, rubs, gallops RESPIRATORY:  Clear to auscultation without rales, wheezing or rhonchi  ABDOMEN: Soft, non-tender, non-distended EXTREMITIES:  No edema; No acute deformity     Asessement and Plan:.     ***     Disposition: F/u with ***  Signed, Alyn Riedinger D Omer Monter, NP

## 2024-07-12 ENCOUNTER — Ambulatory Visit: Admitting: Family Medicine

## 2024-07-12 ENCOUNTER — Inpatient Hospital Stay: Admitting: Family Medicine

## 2024-07-12 ENCOUNTER — Encounter: Payer: Self-pay | Admitting: Family Medicine

## 2024-07-12 VITALS — BP 112/64 | Temp 98.0°F | Ht 62.0 in | Wt 146.0 lb

## 2024-07-12 DIAGNOSIS — R296 Repeated falls: Secondary | ICD-10-CM | POA: Insufficient documentation

## 2024-07-12 DIAGNOSIS — E039 Hypothyroidism, unspecified: Secondary | ICD-10-CM

## 2024-07-12 DIAGNOSIS — I639 Cerebral infarction, unspecified: Secondary | ICD-10-CM

## 2024-07-12 LAB — POCT URINALYSIS DIPSTICK
Bilirubin, UA: NEGATIVE
Blood, UA: NEGATIVE
Glucose, UA: NEGATIVE
Ketones, UA: NEGATIVE
Nitrite, UA: POSITIVE
Protein, UA: NEGATIVE
Spec Grav, UA: 1.02 (ref 1.010–1.025)
Urobilinogen, UA: 0.2 U/dL
pH, UA: 6 (ref 5.0–8.0)

## 2024-07-12 NOTE — Addendum Note (Signed)
 Addended by: Nelly Scriven K on: 07/12/2024 04:02 PM   Modules accepted: Orders

## 2024-07-12 NOTE — Assessment & Plan Note (Signed)
 Pt reports pharmacy refilled the 125mcg dose rather than the 100mcg dose we sent in for her.  Her last TSH level was WNL.  Will continue 125mcg at this time and recheck in 6 weeks.  Pt expressed understanding and is in agreement w/ plan.

## 2024-07-12 NOTE — Assessment & Plan Note (Signed)
 Based on pt's presentation but lack of findings on CT and MRI, it sounds like pt had a TIA.  She was only taking Eliquis  daily rather than twice daily which could have contributed.  She is now taking this twice daily.  Thankfully sxs have resolved and she is back to baseline.

## 2024-07-12 NOTE — Patient Instructions (Addendum)
 Follow up in 6 weeks to recheck thyroid  No need to repeat labs today- you've been stuck enough! Home Health will call you to schedule an evaluation Continue your Metoprolol , Losartan , Crestor , Levothyroxine , and the Eliquis  Call with any questions or concerns Stay Safe!  Stay Healthy! Happy Holidays!!!

## 2024-07-12 NOTE — Assessment & Plan Note (Signed)
 New.  Pt and family member indicate that she has had more falls recently.  Thankfully without injury but family fears this is just a matter of time.  She has borrowed someone's walker to use around the house and feels much more secure when using this.  She will need to return this when they are d/c'd from the hospital and would like to get a rollator of her own.  Referral placed for [facility] PT to do a walker evaluation.  Pt expressed understanding and is in agreement w/ plan.

## 2024-07-12 NOTE — Progress Notes (Signed)
   Subjective:    Patient ID: Colleen Wright, female    DOB: 26-Jun-1941, 83 y.o.   MRN: [id number]  HPI Hospital f/u- pt was admitted 11/16-17 after presenting to ER w/ sudden difficulty speaking and facial numbness.  Was told by a friend that her 'face was drawing'.  She has a hx of CVA and was only taking Eliquis  daily rather than BID due to cost.  Neuro stated she was not a candidate for TPA or intervention.  MRI didn't show any acute findings.  She passed her swallow study.  PT evaluation indicated ongoing need for PT.  She refused outpt PT.  At time of this visit, no D/C summary is available.  She has f/u upcoming w/ Cardiology.  Today pt reports feeling 'a lots better'.  Speech is back to normal, word finding is better.  Family feels memory is worsening w/ time.  Currently using a walker (it's a family member's walker) around the house and a cane when out in public.  Family reports falls.  She also reports burning w/ urination which seems to be an ongoing problem.  Currently taking Metoprolol  25mg  BID, Losartan  1 tab daily, Crestor  20mg  daily, Levothyroxine  125mcg daily (pharmacy error)   Review of Systems For ROS see HPI     Objective:   Physical Exam Vitals reviewed.  Constitutional:      General: She is not in acute distress.    Appearance: Normal appearance. She is well-developed. She is not ill-appearing.  HENT:     Head: Normocephalic and atraumatic.  Eyes:     Conjunctiva/sclera: Conjunctivae normal.     Pupils: Pupils are equal, round, and reactive to light.  Neck:     Thyroid : No thyromegaly.  Cardiovascular:     Rate and Rhythm: Normal rate and regular rhythm.     Heart sounds: Normal heart sounds. No murmur heard. Pulmonary:     Effort: Pulmonary effort is normal. No respiratory distress.     Breath sounds: Normal breath sounds.  Abdominal:     General: There is no distension.     Palpations: Abdomen is soft.     Tenderness: There is no abdominal tenderness.   Musculoskeletal:     Cervical back: Normal range of motion and neck supple.  Lymphadenopathy:     Cervical: No cervical adenopathy.  Skin:    General: Skin is warm and dry.  Neurological:     Mental Status: She is alert and oriented to person, place, and time. Mental status is at baseline.  Psychiatric:        Mood and Affect: Mood normal.        Behavior: Behavior normal.        Thought Content: Thought content normal.           Assessment & Plan:

## 2024-07-14 LAB — URINE CULTURE
MICRO NUMBER:: 17288493
SPECIMEN QUALITY:: ADEQUATE

## 2024-07-16 ENCOUNTER — Ambulatory Visit: Payer: Self-pay | Admitting: Family Medicine

## 2024-07-16 DIAGNOSIS — I69398 Other sequelae of cerebral infarction: Secondary | ICD-10-CM | POA: Diagnosis not present

## 2024-07-16 DIAGNOSIS — Z556 Problems related to health literacy: Secondary | ICD-10-CM | POA: Diagnosis not present

## 2024-07-16 DIAGNOSIS — I10 Essential (primary) hypertension: Secondary | ICD-10-CM | POA: Diagnosis not present

## 2024-07-16 DIAGNOSIS — R296 Repeated falls: Secondary | ICD-10-CM | POA: Diagnosis not present

## 2024-07-16 DIAGNOSIS — R32 Unspecified urinary incontinence: Secondary | ICD-10-CM | POA: Diagnosis not present

## 2024-07-16 DIAGNOSIS — M6281 Muscle weakness (generalized): Secondary | ICD-10-CM | POA: Diagnosis not present

## 2024-07-16 DIAGNOSIS — E039 Hypothyroidism, unspecified: Secondary | ICD-10-CM | POA: Diagnosis not present

## 2024-07-16 DIAGNOSIS — Z7901 Long term (current) use of anticoagulants: Secondary | ICD-10-CM | POA: Diagnosis not present

## 2024-07-16 DIAGNOSIS — Z9181 History of falling: Secondary | ICD-10-CM | POA: Diagnosis not present

## 2024-07-16 MED ORDER — SULFAMETHOXAZOLE-TRIMETHOPRIM 800-160 MG PO TABS: 1.0000 | ORAL_TABLET | Freq: Two times a day (BID) | ORAL | 0 refills | Status: AC

## 2024-07-17 ENCOUNTER — Ambulatory Visit: Admitting: Cardiology

## 2024-07-17 DIAGNOSIS — I25118 Atherosclerotic heart disease of native coronary artery with other forms of angina pectoris: Secondary | ICD-10-CM

## 2024-07-17 DIAGNOSIS — I10 Essential (primary) hypertension: Secondary | ICD-10-CM

## 2024-07-17 DIAGNOSIS — I480 Paroxysmal atrial fibrillation: Secondary | ICD-10-CM

## 2024-07-17 DIAGNOSIS — Z7901 Long term (current) use of anticoagulants: Secondary | ICD-10-CM

## 2024-07-17 NOTE — Progress Notes (Signed)
 Patient verbalized understanding. She will pick up medication.

## 2024-07-20 DIAGNOSIS — R296 Repeated falls: Secondary | ICD-10-CM | POA: Diagnosis not present

## 2024-07-20 DIAGNOSIS — Z556 Problems related to health literacy: Secondary | ICD-10-CM | POA: Diagnosis not present

## 2024-07-20 DIAGNOSIS — I10 Essential (primary) hypertension: Secondary | ICD-10-CM | POA: Diagnosis not present

## 2024-07-20 DIAGNOSIS — Z9181 History of falling: Secondary | ICD-10-CM | POA: Diagnosis not present

## 2024-07-20 DIAGNOSIS — M6281 Muscle weakness (generalized): Secondary | ICD-10-CM | POA: Diagnosis not present

## 2024-07-20 DIAGNOSIS — R32 Unspecified urinary incontinence: Secondary | ICD-10-CM | POA: Diagnosis not present

## 2024-07-20 DIAGNOSIS — I69398 Other sequelae of cerebral infarction: Secondary | ICD-10-CM | POA: Diagnosis not present

## 2024-07-20 DIAGNOSIS — E039 Hypothyroidism, unspecified: Secondary | ICD-10-CM | POA: Diagnosis not present

## 2024-07-20 DIAGNOSIS — Z7901 Long term (current) use of anticoagulants: Secondary | ICD-10-CM | POA: Diagnosis not present

## 2024-07-26 ENCOUNTER — Telehealth: Payer: Self-pay

## 2024-07-26 NOTE — Telephone Encounter (Signed)
 Patient would like to stop PT. She wants to know if that is okay with you. Please see message below.   Copied from CRM #[id number]. Topic: General - Other >> Jul 26, 2024  3:29 PM Colleen Wright wrote: Reason for CRM: Patient called in and would like to know if provider thinks she can stop having the PT come to her home?She stated that she doesn't need them any more.She said that she is able to do all of the exercises that they left her to do without assistance.  She said that she doesn't have to use her walker all the time anymore.

## 2024-07-27 NOTE — Telephone Encounter (Signed)
 PT is supposed to come tomorrow 07/28/2024 and have another assessment and she will discuss it with them and get their thought

## 2024-07-27 NOTE — Telephone Encounter (Signed)
 She can definitely discuss this with PT and get their opinion.  If they are in agreement that she no longer needs services, we can end early.

## 2024-07-28 DIAGNOSIS — M6281 Muscle weakness (generalized): Secondary | ICD-10-CM | POA: Diagnosis not present

## 2024-07-28 DIAGNOSIS — R32 Unspecified urinary incontinence: Secondary | ICD-10-CM | POA: Diagnosis not present

## 2024-07-28 DIAGNOSIS — E039 Hypothyroidism, unspecified: Secondary | ICD-10-CM | POA: Diagnosis not present

## 2024-07-28 DIAGNOSIS — R296 Repeated falls: Secondary | ICD-10-CM | POA: Diagnosis not present

## 2024-07-28 DIAGNOSIS — Z9181 History of falling: Secondary | ICD-10-CM | POA: Diagnosis not present

## 2024-07-28 DIAGNOSIS — I10 Essential (primary) hypertension: Secondary | ICD-10-CM | POA: Diagnosis not present

## 2024-07-28 DIAGNOSIS — Z7901 Long term (current) use of anticoagulants: Secondary | ICD-10-CM | POA: Diagnosis not present

## 2024-07-28 DIAGNOSIS — Z556 Problems related to health literacy: Secondary | ICD-10-CM | POA: Diagnosis not present

## 2024-07-28 DIAGNOSIS — I69398 Other sequelae of cerebral infarction: Secondary | ICD-10-CM | POA: Diagnosis not present

## 2024-07-31 DIAGNOSIS — I69398 Other sequelae of cerebral infarction: Secondary | ICD-10-CM | POA: Diagnosis not present

## 2024-07-31 DIAGNOSIS — Z9181 History of falling: Secondary | ICD-10-CM | POA: Diagnosis not present

## 2024-07-31 DIAGNOSIS — R32 Unspecified urinary incontinence: Secondary | ICD-10-CM | POA: Diagnosis not present

## 2024-07-31 DIAGNOSIS — M6281 Muscle weakness (generalized): Secondary | ICD-10-CM | POA: Diagnosis not present

## 2024-07-31 DIAGNOSIS — E039 Hypothyroidism, unspecified: Secondary | ICD-10-CM | POA: Diagnosis not present

## 2024-07-31 DIAGNOSIS — Z556 Problems related to health literacy: Secondary | ICD-10-CM | POA: Diagnosis not present

## 2024-07-31 DIAGNOSIS — Z7901 Long term (current) use of anticoagulants: Secondary | ICD-10-CM | POA: Diagnosis not present

## 2024-07-31 DIAGNOSIS — I10 Essential (primary) hypertension: Secondary | ICD-10-CM | POA: Diagnosis not present

## 2024-07-31 DIAGNOSIS — R296 Repeated falls: Secondary | ICD-10-CM | POA: Diagnosis not present

## 2024-08-07 ENCOUNTER — Telehealth: Payer: Self-pay | Admitting: Family Medicine

## 2024-08-07 NOTE — Telephone Encounter (Signed)
 Type of form received: [facility] Certification and Plan of Care   Additional comments:   Received by: FAX  Form should be Faxed/mailed to: (address/ fax #) [phone]  Is patient requesting call for pickup:  Form placed:  Provider's bin  Attach charge sheet.  Provider will determine charge.  Individual made aware of 3-5 business day turn around Yes?

## 2024-08-07 NOTE — Telephone Encounter (Signed)
 Obtained documents and placed in providers folder at nurse station

## 2024-08-11 NOTE — Telephone Encounter (Signed)
 Form signed and returned to [facility]

## 2024-08-11 NOTE — Telephone Encounter (Signed)
 Forms have been faxed and scanned to provider folder

## 2024-08-18 ENCOUNTER — Other Ambulatory Visit: Payer: Self-pay | Admitting: Family Medicine

## 2024-08-18 NOTE — Telephone Encounter (Signed)
 Requested Prescriptions   Pending Prescriptions Disp Refills   ALPRAZolam  (XANAX ) 0.5 MG tablet [Pharmacy Med Name: ALPRAZOLAM  0.5 MG TABLET] 30 tablet 1    Sig: TAKE 1 TABLET BY MOUTH EVERYDAY AT BEDTIME     Date of patient request: 08/19/23 Last office visit: 07/12/2024 Upcoming visit: 08/23/2024 Date of last refill: 05/23/24 Last refill amount: 30  w/ 1 refill

## 2024-08-20 ENCOUNTER — Other Ambulatory Visit: Payer: Self-pay | Admitting: Family Medicine

## 2024-08-20 DIAGNOSIS — E039 Hypothyroidism, unspecified: Secondary | ICD-10-CM

## 2024-08-23 ENCOUNTER — Ambulatory Visit: Payer: Self-pay | Admitting: Family Medicine

## 2024-08-23 ENCOUNTER — Ambulatory Visit: Admitting: Family Medicine

## 2024-08-23 ENCOUNTER — Encounter: Payer: Self-pay | Admitting: Family Medicine

## 2024-08-23 VITALS — BP 96/60 | HR 62 | Wt 147.6 lb

## 2024-08-23 DIAGNOSIS — E039 Hypothyroidism, unspecified: Secondary | ICD-10-CM

## 2024-08-23 DIAGNOSIS — Z23 Encounter for immunization: Secondary | ICD-10-CM

## 2024-08-23 DIAGNOSIS — N390 Urinary tract infection, site not specified: Secondary | ICD-10-CM

## 2024-08-23 LAB — POCT URINALYSIS DIPSTICK
Bilirubin, UA: NEGATIVE
Blood, UA: POSITIVE
Glucose, UA: NEGATIVE
Ketones, UA: NEGATIVE
Nitrite, UA: NEGATIVE
Protein, UA: POSITIVE — AB
Spec Grav, UA: 1.025
Urobilinogen, UA: 0.2 U/dL
pH, UA: 6

## 2024-08-23 NOTE — Patient Instructions (Addendum)
 Schedule a lab visit at your convenience in the next few days Schedule your complete physical in 3 months We'll notify you of your lab results and make any changes if needed Keep up the good work- you look great! Schedule an appt w/ Cardiology for your routine follow upb Call with any questions or concerns Stay Safe!  Stay Healthy! Happy New Year!

## 2024-08-23 NOTE — Assessment & Plan Note (Signed)
 Pt reports ongoing burning w/ urination in the morning, but 'it's not bad'.  Check UA and culture today and tx prn.

## 2024-08-23 NOTE — Progress Notes (Signed)
 Obtained urine and placed in fridge in lab for tomorrow.

## 2024-08-23 NOTE — Progress Notes (Signed)
" ° °  Subjective:    Patient ID: Colleen Wright, female    DOB: Jul 11, 1941, 84 y.o.   MRN: [id number]  HPI Hypothyroid- chronic problem, on Levothyroxine  .  Due for repeat labs as they have been in and out of range.  Denies changes to skin/hair/nails.  Energy is unchanged.  Recurrent UTI- pt reports some burning in the morning 'but it's not bad'.   Review of Systems For ROS see HPI     Objective:   Physical Exam Vitals reviewed.  Constitutional:      General: She is not in acute distress.    Appearance: Normal appearance. She is well-developed. She is not ill-appearing.  HENT:     Head: Normocephalic and atraumatic.  Eyes:     Conjunctiva/sclera: Conjunctivae normal.     Pupils: Pupils are equal, round, and reactive to light.  Neck:     Thyroid : No thyromegaly.  Cardiovascular:     Rate and Rhythm: Normal rate and regular rhythm.     Heart sounds: Normal heart sounds. No murmur heard. Pulmonary:     Effort: Pulmonary effort is normal. No respiratory distress.     Breath sounds: Normal breath sounds.  Abdominal:     General: There is no distension.     Palpations: Abdomen is soft.     Tenderness: There is no abdominal tenderness.  Musculoskeletal:     Cervical back: Normal range of motion and neck supple.  Lymphadenopathy:     Cervical: No cervical adenopathy.  Skin:    General: Skin is warm and dry.  Neurological:     General: No focal deficit present.     Mental Status: She is alert and oriented to person, place, and time.  Psychiatric:        Mood and Affect: Mood normal.        Behavior: Behavior normal.        Thought Content: Thought content normal.           Assessment & Plan:    "

## 2024-08-23 NOTE — Assessment & Plan Note (Signed)
 Chronic problem.  Currently on Levothyroxine  100mcg daily.  TSH has been fluctuating.  Currently asymptomatic.  Check labs and adjust meds prn.

## 2024-08-24 ENCOUNTER — Other Ambulatory Visit: Payer: Self-pay

## 2024-08-24 DIAGNOSIS — N390 Urinary tract infection, site not specified: Secondary | ICD-10-CM

## 2024-08-24 NOTE — Addendum Note (Signed)
 Addended by: TANDA HARVEY D on: 08/24/2024 08:10 AM   Modules accepted: Orders

## 2024-08-24 NOTE — Addendum Note (Signed)
 Addended by: TANDA HARVEY D on: 08/24/2024 08:08 AM   Modules accepted: Orders

## 2024-08-25 LAB — URINE CULTURE
MICRO NUMBER:: 17443673
Result:: NO GROWTH
SPECIMEN QUALITY:: ADEQUATE

## 2024-08-27 NOTE — Progress Notes (Unsigned)
 "  Cardiology Office Note    Date:  08/27/2024  ID:  Colleen Wright, DOB [birth year], MRN [id number] PCP:  Mahlon Comer BRAVO, MD  Cardiologist:  Jerel Balding, MD  Electrophysiologist:  None   Chief Complaint: ***  History of Present Illness: Colleen    Colleen Wright is a 84 y.o. female with visit-pertinent history of paroxysmal atrial fibrillation on chronic anticoagulation, cryptogenic stroke, hypertension, CAD, carotid artery stenosis and hyperlipidemia.   Patient previously suffered a stroke which led to implantable loop recorder which detected asymptomatic atrial fibrillation.  She has been on anticoagulation with Eliquis  5 mg twice daily.  Carotid artery duplex in December 2020 showed 50 to 69% stenosis in the left ICA.  Loop recorder has since reached end of service, while functional ILR showed very relatively low A-fib burden.   Patient had cardiac catheterization in June 2017 that showed moderate to severe RCA branch stenosis that was treated with medical therapy due to tortuosity of the proximal vessel.   Patient was hospitalized in 04/2020 vertigo and falls.  She was unable to care for herself at home.   Patient was last seen in clinic on 12/20/2023 by Jon Hails, PA.  Patient elected to continue with Eliquis  and was not started on Coumadin.  Patient's losartan  was discontinued and she was continued on 12.5 mg of hydrochlorothiazide  alone.   Patient was seen by Dr. Landy on 05/15/2024 with increased weakness over the past year however felt that symptoms had worsened over the past couple of months.  She also reported having trouble walking from the kitchen to the bathroom without sitting down, feeling dizzy at times and short of breath.  Reported intermittent chest pains, unable to identify frequency.  Dr. Landy called DOD, Dr. Mona who recommended discontinuation of hydrochlorothiazide  and follow-up in office.   Patient presented to the emergency room in 04/2024 for generalized weakness that  had gotten progressively worse in the past 2 weeks.  Had recently been diagnosed with a UTI and started on Keflex  had been on for 1 day.  Patient was found to be hypokalemic at 2.8, her HCTZ had been discontinued the day prior.   Patient was last seen in clinic on 05/17/2024 regarding increased fatigue.  Patient reported she been having worsening progressive fatigue, dizziness, lightheadedness and shortness of breath for the prior few months.  She reported that in the last 2 weeks it had progressively worsened.  She reported some improvement with discontinuation of hydrochlorothiazide .  She denied any palpitations, increased lower extremity edema, orthopnea or PND.  Patient reported that she was simply unable to walk long distances for increased shortness of breath and fatigue.  On review of patient's home medication bottles she had been taking a combination pill of losartan  50 mg with hydrochlorothiazide  12.5 mg in addition to an additional 12.5 mg of hydrochlorothiazide , patient was unaware that her losartan -HCTZ had been previously discontinued.  Patient's combination pill and hydrochlorothiazide  was discontinued, she was started on losartan  25 mg daily.    Echocardiogram on 06/16/2024 indicated LVEF 60 to 65%, no RWMA, moderate asymmetric LVH of the basal septal segment, RV systolic function was normal, RV mildly enlarged, mildly elevated PASP, mild mitral valve regurgitation, severe mitral annular calcification, aortic valve regurgitation is not visualized, aortic valve sclerosis present with no evidence of stenosis.   On 07/02/2024 patient presented to the emergency department with facial numbness, right-sided facial weakness and speech difficulties.  Patient was seen by neurology, MRI was negative for acute  stroke patient reported she had been taking her Eliquis  only once daily due to cost, was planning to resume twice daily.  It was felt that her symptoms were likely a TIA in origin.  CTA head and neck  proximal left subclavian artery stenosis estimated at 65%, proximal left ICA stenosis at 65%, atherosclerotic changes at right carotid bifurcation and proximal right ICA without significant stenosis.   Today she presents for follow-up.  She reports that she       HTN/fatigue: Blood pressure today   PAF:   CAD/shortness of breath:  LHC in June 2017 showed moderate to severe RCA branch stenosis that was treated with medical therapy due to tortuosity of the proximal vessel.    CVA: Patient with history of CVA.  She was recently admitted in 06/2024 with facial numbness, right sided facial weakness and speech difficulties.  MRI was negative for acute stroke.  Felt to be TIA.  She reported she had not been taking her Eliquis  twice daily as previously directed.  Labwork independently reviewed:   ROS: .   *** denies chest pain, shortness of breath, lower extremity edema, fatigue, palpitations, melena, hematuria, hemoptysis, diaphoresis, weakness, presyncope, syncope, orthopnea, and PND.  All other systems are reviewed and otherwise negative.  Studies Reviewed: Colleen    EKG:  EKG is ordered today, personally reviewed, demonstrating ***     CV Studies: Cardiac studies reviewed are outlined and summarized above. Otherwise please see EMR for full report. Cardiac Studies & Procedures   ______________________________________________________________________________________________ CARDIAC CATHETERIZATION  CARDIAC CATHETERIZATION 01/24/2016  Conclusion  Prox RCA lesion, 20% stenosed.  Mid RCA lesion, 50% stenosed.  Dist RCA-1 lesion, 70% stenosed.  Dist RCA-2 lesion, 80% stenosed. The lesion was previously treated with angioplasty greater than two years ago.  Prox LAD to Mid LAD lesion, 30% stenosed.  Mid LAD lesion, 60% stenosed.  The left ventricular systolic function is normal.  1. Moderate 2 vessel CAD -60% mid LAD -70% distal RCA - 80% ostial PLOM 2. Normal LV function 3. Dilated  aortic root. 4. MAC  Plan: would recommend medical therapy. Her symptoms are not clearly anginal. She has been on no antianginal therapy. During her procedure she was noted to have episodes of tachycardia with HR to 130s. She is also hypertensive. The distal RCA lesion would be amenable to PCI but would be difficult due to marked tortuosity of the RCA. I will add metoprolol  25 mg bid and arrange follow up as outpatient.  Findings Coronary Findings Diagnostic  Dominance: Right  Left Anterior Descending Diffuse.  Right Coronary Artery  Discrete. Discrete. The lesion was previously treated with angioplasty greater than two years ago.  Intervention  No interventions have been documented.     ECHOCARDIOGRAM  ECHOCARDIOGRAM COMPLETE 06/16/2024  Narrative ECHOCARDIOGRAM REPORT    Patient Name:   Colleen Wright   Date of Exam: 06/16/2024 Medical Rec #:  [id number]       Height:       62.0 in Accession #:    [id number]      Weight:       148.0 lb Date of Birth:  Jun 15, 1941       BSA:          1.682 m Patient Age:    83 years        BP:           117/65 mmHg Patient Gender: F  HR:           63 bpm. Exam Location:  [address]  Procedure: 2D Echo, Cardiac Doppler and Color Doppler (Both Spectral and Color Flow Doppler were utilized during procedure).  Indications:    Coronary artery disease of native artery of native heart with stable angina pectoris [I25.118 (ICD-10-CM)]; Essential hypertension [I10 (ICD-10-CM)]; SOB (shortness of breath) [R06.02 (ICD-10-CM)]; Weakness [R53.1 (ICD-10-CM)]  History:        Patient has prior history of Echocardiogram examinations, most recent 08/10/2015. CAD; Risk Factors:Hypertension, Dyslipidemia and Former Smoker.  Sonographer:    Rosaline Fujisawa MHA, RDMS, RVT, RDCS Referring Phys: [id number] [facility] D Dnasia Gauna  IMPRESSIONS   1. Left ventricular ejection fraction, by estimation, is 60 to 65%. The left ventricle has normal  function. The left ventricle has no regional wall motion abnormalities. There is moderate asymmetric left ventricular hypertrophy of the basal-septal segment. Left ventricular diastolic parameters are indeterminate. 2. Right ventricular systolic function is normal. The right ventricular size is mildly enlarged. There is mildly elevated pulmonary artery systolic pressure. The estimated right ventricular systolic pressure is 36.9 mmHg. 3. The mitral valve is degenerative. Mild mitral valve regurgitation. Severe mitral annular calcification. MV gradients were not measured 4. The aortic valve is tricuspid. Aortic valve regurgitation is not visualized. Aortic valve sclerosis is present, with no evidence of aortic valve stenosis. 5. The inferior vena cava is normal in size with <50% respiratory variability, suggesting right atrial pressure of 8 mmHg.  FINDINGS Left Ventricle: Left ventricular ejection fraction, by estimation, is 60 to 65%. The left ventricle has normal function. The left ventricle has no regional wall motion abnormalities. The left ventricular internal cavity size was normal in size. There is moderate asymmetric left ventricular hypertrophy of the basal-septal segment. Left ventricular diastolic parameters are indeterminate.  Right Ventricle: The right ventricular size is mildly enlarged. No increase in right ventricular wall thickness. Right ventricular systolic function is normal. There is mildly elevated pulmonary artery systolic pressure. The tricuspid regurgitant velocity is 2.69 m/s, and with an assumed right atrial pressure of 8 mmHg, the estimated right ventricular systolic pressure is 36.9 mmHg.  Left Atrium: Left atrial size was normal in size.  Right Atrium: Right atrial size was normal in size.  Pericardium: There is no evidence of pericardial effusion.  Mitral Valve: The mitral valve is degenerative in appearance. Severe mitral annular calcification. Mild mitral valve  regurgitation.  Tricuspid Valve: The tricuspid valve is normal in structure. Tricuspid valve regurgitation is mild.  Aortic Valve: The aortic valve is tricuspid. Aortic valve regurgitation is not visualized. Aortic valve sclerosis is present, with no evidence of aortic valve stenosis. Aortic valve mean gradient measures 2.0 mmHg. Aortic valve peak gradient measures 3.1 mmHg. Aortic valve area, by VTI measures 2.01 cm.  Pulmonic Valve: The pulmonic valve was grossly normal. Pulmonic valve regurgitation is trivial.  Aorta: The aortic root and ascending aorta are structurally normal, with no evidence of dilitation.  Venous: The inferior vena cava is normal in size with less than 50% respiratory variability, suggesting right atrial pressure of 8 mmHg.  IAS/Shunts: The atrial septum is grossly normal.   LEFT VENTRICLE PLAX 2D LVIDd:         4.67 cm LVIDs:         2.34 cm LV PW:         0.96 cm LV IVS:        1.28 cm LVOT diam:     1.74 cm LV  SV:         41 LV SV Index:   24 LVOT Area:     2.38 cm   RIGHT VENTRICLE RV Basal diam:  3.84 cm RV Mid diam:    3.65 cm TAPSE (M-mode): 2.6 cm  LEFT ATRIUM             Index        RIGHT ATRIUM           Index LA diam:        4.49 cm 2.67 cm/m   RA Area:     16.80 cm LA Vol (A2C):   33.6 ml 19.98 ml/m  RA Volume:   39.40 ml  23.42 ml/m LA Vol (A4C):   42.2 ml 25.09 ml/m LA Biplane Vol: 40.7 ml 24.20 ml/m AORTIC VALVE AV Area (Vmax):    2.22 cm AV Area (Vmean):   2.20 cm AV Area (VTI):     2.01 cm AV Vmax:           88.30 cm/s AV Vmean:          59.500 cm/s AV VTI:            0.205 m AV Peak Grad:      3.1 mmHg AV Mean Grad:      2.0 mmHg LVOT Vmax:         82.30 cm/s LVOT Vmean:        55.000 cm/s LVOT VTI:          0.173 m LVOT/AV VTI ratio: 0.84  AORTA Ao Root diam: 3.11 cm Ao Asc diam:  3.64 cm  MITRAL VALVE                TRICUSPID VALVE MV Area (PHT): 3.65 cm     TR Peak grad:   28.9 mmHg MV Decel Time: 208  msec     TR Vmax:        269.00 cm/s MR Peak grad: 53.3 mmHg MR Vmax:      365.00 cm/s   SHUNTS MV E velocity: 92.20 cm/s   Systemic VTI:  0.17 m MV A velocity: 107.00 cm/s  Systemic Diam: 1.74 cm MV E/A ratio:  0.86  Lonni Nanas MD Electronically signed by Lonni Nanas MD Signature Date/Time: 06/16/2024/3:28:48 PM    Final          ______________________________________________________________________________________________       Current Reported Medications:.    Active Medications[1]  Physical Exam:    VS:  There were no vitals taken for this visit.   Wt Readings from Last 3 Encounters:  08/23/24 147 lb 9.6 oz (67 kg)  07/12/24 146 lb (66.2 kg)  07/02/24 153 lb 14.1 oz (69.8 kg)    GEN: Well nourished, well developed in no acute distress NECK: No JVD; No carotid bruits CARDIAC: ***RRR, no murmurs, rubs, gallops RESPIRATORY:  Clear to auscultation without rales, wheezing or rhonchi  ABDOMEN: Soft, non-tender, non-distended EXTREMITIES:  No edema; No acute deformity     Asessement and Plan:.     ***     Disposition: F/u with ***  Signed, Cartez Mogle D Nitesh Pitstick, NP      [1]  No outpatient medications have been marked as taking for the 09/01/24 encounter (Appointment) with Anaise Sterbenz D, NP.   "

## 2024-08-28 NOTE — Progress Notes (Signed)
 Lab results have been discussed.   Verbalized understanding? Yes  Are there any questions? No

## 2024-09-01 ENCOUNTER — Ambulatory Visit: Payer: Self-pay | Admitting: Cardiology

## 2024-09-07 ENCOUNTER — Telehealth: Payer: Self-pay

## 2024-09-07 NOTE — Telephone Encounter (Signed)
 Placed in sign folder at [facility]

## 2024-09-07 NOTE — Telephone Encounter (Signed)
 Type of form received: [facility] Request for billing information  Additional comments: Multiple patients listed on document -other patient information covered and copies made for their charts.   Request to add a Dx code for urine culture, and Presumptive ID   Received by: fax  Form should be Faxed/mailed to: [phone]  Is patient requesting call for pickup: no  Form placed:  In [facility] sign folder   Attach charge sheet.  Provider will determine charge.  Individual made aware of 3-5 business day turn around No?

## 2024-09-08 ENCOUNTER — Other Ambulatory Visit

## 2024-09-08 ENCOUNTER — Telehealth: Payer: Self-pay | Admitting: Cardiovascular Disease

## 2024-09-08 DIAGNOSIS — E785 Hyperlipidemia, unspecified: Secondary | ICD-10-CM

## 2024-09-08 NOTE — Telephone Encounter (Signed)
" °*  STAT* If patient is at the pharmacy, call can be transferred to refill team.   1. Which medications need to be refilled? (please list name of each medication and dose if known)   metoprolol  succinate (TOPROL -XL) 50 MG 24 hr tablet    2. Would you like to learn more about the convenience, safety, & potential cost savings by using the [facility]? No    3. Are you open to using the [facility] (Type [facility]. no  4. Which pharmacy/location (including street and city if local pharmacy) is medication to be sent to?  [id number] - [place], [place] - [address]     5. Do they need a 30 day or 90 day supply? 90  "

## 2024-09-14 ENCOUNTER — Telehealth: Payer: Self-pay

## 2024-09-14 MED ORDER — METOPROLOL SUCCINATE ER 50 MG PO TB24: 25.0000 mg | ORAL_TABLET | Freq: Every day | ORAL | 0 refills | Status: AC

## 2024-09-14 NOTE — Telephone Encounter (Signed)
 Called patient because I am confused on the message below. She had to cancel appt tomorrow because of weather. R/s appt and wanted to know should she keep taking the medication. I told her yes because Dr. Mahlon has not noted for her to stop use. Patient verbalized understanding.    Copied from CRM #[id number]. Topic: Clinical - Medication Question >> Colleen Wright, Colleen Wright wrote: Reason for CRM: patient wants to receive a call back to go over medication levothyroxine  (SYNTHROID ) 100 MCG tablet to see if she can still take it until the weather subsides

## 2024-09-14 NOTE — Telephone Encounter (Signed)
 Refill sent

## 2024-09-15 ENCOUNTER — Other Ambulatory Visit

## 2024-10-13 ENCOUNTER — Ambulatory Visit: Admitting: Cardiology

## 2024-10-13 ENCOUNTER — Other Ambulatory Visit
